# Patient Record
Sex: Male | Born: 1935 | Race: White | Hispanic: No | Smoking: Former smoker
Health system: Southern US, Community
[De-identification: ages and names within clinical notes are randomized; demographics above are authoritative.]

## PROBLEM LIST (undated history)

## (undated) DIAGNOSIS — G629 Polyneuropathy, unspecified: Secondary | ICD-10-CM

## (undated) DIAGNOSIS — K5900 Constipation, unspecified: Secondary | ICD-10-CM

## (undated) DIAGNOSIS — J449 Chronic obstructive pulmonary disease, unspecified: Secondary | ICD-10-CM

## (undated) DIAGNOSIS — I471 Supraventricular tachycardia, unspecified: Secondary | ICD-10-CM

## (undated) HISTORY — DX: Polyneuropathy, unspecified: G62.9

## (undated) HISTORY — DX: Supraventricular tachycardia: I47.1

## (undated) HISTORY — PX: CYST REMOVAL NECK: SHX6281

## (undated) HISTORY — DX: Supraventricular tachycardia, unspecified: I47.10

## (undated) HISTORY — DX: Constipation, unspecified: K59.00

---

## 2007-05-11 ENCOUNTER — Encounter: Admission: RE | Admit: 2007-05-11 | Discharge: 2007-05-11 | Payer: Self-pay | Admitting: Gastroenterology

## 2008-05-26 ENCOUNTER — Encounter: Admission: RE | Admit: 2008-05-26 | Discharge: 2008-05-26 | Payer: Self-pay | Admitting: Gastroenterology

## 2009-05-08 ENCOUNTER — Encounter: Admission: RE | Admit: 2009-05-08 | Discharge: 2009-05-08 | Payer: Self-pay | Admitting: Gastroenterology

## 2009-05-15 ENCOUNTER — Encounter: Admission: RE | Admit: 2009-05-15 | Discharge: 2009-05-15 | Payer: Self-pay | Admitting: Gastroenterology

## 2009-05-16 ENCOUNTER — Encounter: Admission: RE | Admit: 2009-05-16 | Discharge: 2009-05-16 | Payer: Self-pay | Admitting: Gastroenterology

## 2010-06-06 ENCOUNTER — Encounter: Admission: RE | Admit: 2010-06-06 | Discharge: 2010-06-06 | Payer: Self-pay | Admitting: Gastroenterology

## 2010-10-06 ENCOUNTER — Encounter: Payer: Self-pay | Admitting: Gastroenterology

## 2011-05-03 ENCOUNTER — Ambulatory Visit (INDEPENDENT_AMBULATORY_CARE_PROVIDER_SITE_OTHER): Payer: Self-pay

## 2011-05-03 ENCOUNTER — Inpatient Hospital Stay (INDEPENDENT_AMBULATORY_CARE_PROVIDER_SITE_OTHER)
Admission: RE | Admit: 2011-05-03 | Discharge: 2011-05-03 | Disposition: A | Discharge status: Home / Self Care | Payer: Self-pay | Source: Ambulatory Visit | Attending: Emergency Medicine | Admitting: Emergency Medicine

## 2011-05-03 DIAGNOSIS — S82843B Displaced bimalleolar fracture of unspecified lower leg, initial encounter for open fracture type I or II: Secondary | ICD-10-CM

## 2011-07-16 ENCOUNTER — Other Ambulatory Visit: Payer: Self-pay | Admitting: Gastroenterology

## 2011-07-16 DIAGNOSIS — R1013 Epigastric pain: Secondary | ICD-10-CM

## 2011-07-30 ENCOUNTER — Other Ambulatory Visit: Payer: Self-pay | Admitting: Gastroenterology

## 2011-07-30 ENCOUNTER — Ambulatory Visit
Admission: RE | Admit: 2011-07-30 | Discharge: 2011-07-30 | Disposition: A | Discharge status: Home / Self Care | Payer: Medicare Other | Source: Ambulatory Visit | Attending: Gastroenterology | Admitting: Gastroenterology

## 2011-07-30 DIAGNOSIS — Z1211 Encounter for screening for malignant neoplasm of colon: Secondary | ICD-10-CM

## 2011-07-30 DIAGNOSIS — R1013 Epigastric pain: Secondary | ICD-10-CM

## 2011-08-12 ENCOUNTER — Ambulatory Visit
Admission: RE | Admit: 2011-08-12 | Discharge: 2011-08-12 | Disposition: A | Discharge status: Home / Self Care | Payer: Medicare Other | Source: Ambulatory Visit | Attending: Gastroenterology | Admitting: Gastroenterology

## 2011-08-12 DIAGNOSIS — Z1211 Encounter for screening for malignant neoplasm of colon: Secondary | ICD-10-CM

## 2012-04-27 ENCOUNTER — Other Ambulatory Visit: Payer: Self-pay | Admitting: Gastroenterology

## 2012-04-27 DIAGNOSIS — R109 Unspecified abdominal pain: Secondary | ICD-10-CM

## 2012-05-04 ENCOUNTER — Ambulatory Visit
Admission: RE | Admit: 2012-05-04 | Discharge: 2012-05-04 | Disposition: A | Discharge status: Home / Self Care | Payer: Medicare Other | Source: Ambulatory Visit | Attending: Gastroenterology | Admitting: Gastroenterology

## 2012-05-04 DIAGNOSIS — R109 Unspecified abdominal pain: Secondary | ICD-10-CM

## 2013-04-25 ENCOUNTER — Other Ambulatory Visit: Payer: Self-pay | Admitting: Gastroenterology

## 2013-04-25 DIAGNOSIS — K3189 Other diseases of stomach and duodenum: Secondary | ICD-10-CM

## 2013-04-25 DIAGNOSIS — Z1211 Encounter for screening for malignant neoplasm of colon: Secondary | ICD-10-CM

## 2013-04-29 ENCOUNTER — Ambulatory Visit
Admission: RE | Admit: 2013-04-29 | Discharge: 2013-04-29 | Disposition: A | Discharge status: Home / Self Care | Payer: 59 | Source: Ambulatory Visit | Attending: Gastroenterology | Admitting: Gastroenterology

## 2013-04-29 DIAGNOSIS — Z1211 Encounter for screening for malignant neoplasm of colon: Secondary | ICD-10-CM

## 2013-04-29 DIAGNOSIS — R1013 Epigastric pain: Secondary | ICD-10-CM

## 2014-05-03 ENCOUNTER — Ambulatory Visit: Payer: Medicare PPO | Attending: Internal Medicine | Admitting: Physical Therapy

## 2014-05-03 DIAGNOSIS — M6281 Muscle weakness (generalized): Secondary | ICD-10-CM | POA: Diagnosis not present

## 2014-05-03 DIAGNOSIS — IMO0001 Reserved for inherently not codable concepts without codable children: Secondary | ICD-10-CM | POA: Insufficient documentation

## 2014-05-03 DIAGNOSIS — R269 Unspecified abnormalities of gait and mobility: Secondary | ICD-10-CM | POA: Diagnosis not present

## 2014-05-03 DIAGNOSIS — R293 Abnormal posture: Secondary | ICD-10-CM | POA: Diagnosis not present

## 2014-06-05 ENCOUNTER — Other Ambulatory Visit: Payer: Self-pay | Admitting: Gastroenterology

## 2014-06-05 DIAGNOSIS — R198 Other specified symptoms and signs involving the digestive system and abdomen: Secondary | ICD-10-CM

## 2014-06-09 ENCOUNTER — Ambulatory Visit
Admission: RE | Admit: 2014-06-09 | Discharge: 2014-06-09 | Disposition: A | Discharge status: Home / Self Care | Payer: 59 | Source: Ambulatory Visit | Attending: Gastroenterology | Admitting: Gastroenterology

## 2014-06-09 DIAGNOSIS — R198 Other specified symptoms and signs involving the digestive system and abdomen: Secondary | ICD-10-CM

## 2014-06-14 ENCOUNTER — Ambulatory Visit: Payer: Medicare PPO | Attending: Internal Medicine | Admitting: Physical Therapy

## 2014-06-14 DIAGNOSIS — M6281 Muscle weakness (generalized): Secondary | ICD-10-CM | POA: Diagnosis not present

## 2014-06-14 DIAGNOSIS — R293 Abnormal posture: Secondary | ICD-10-CM | POA: Diagnosis not present

## 2014-06-14 DIAGNOSIS — IMO0001 Reserved for inherently not codable concepts without codable children: Secondary | ICD-10-CM | POA: Insufficient documentation

## 2014-06-14 DIAGNOSIS — R269 Unspecified abnormalities of gait and mobility: Secondary | ICD-10-CM | POA: Diagnosis not present

## 2014-06-21 ENCOUNTER — Ambulatory Visit: Payer: Medicare PPO | Attending: Internal Medicine | Admitting: Physical Therapy

## 2014-06-21 DIAGNOSIS — R293 Abnormal posture: Secondary | ICD-10-CM | POA: Diagnosis not present

## 2014-06-21 DIAGNOSIS — M6281 Muscle weakness (generalized): Secondary | ICD-10-CM | POA: Insufficient documentation

## 2014-06-21 DIAGNOSIS — R269 Unspecified abnormalities of gait and mobility: Secondary | ICD-10-CM | POA: Insufficient documentation

## 2014-06-21 DIAGNOSIS — Z5189 Encounter for other specified aftercare: Secondary | ICD-10-CM | POA: Diagnosis not present

## 2014-06-23 ENCOUNTER — Ambulatory Visit: Payer: Medicare PPO

## 2014-06-23 DIAGNOSIS — Z5189 Encounter for other specified aftercare: Secondary | ICD-10-CM | POA: Diagnosis not present

## 2014-06-26 ENCOUNTER — Ambulatory Visit: Payer: Medicare PPO

## 2014-06-26 DIAGNOSIS — Z5189 Encounter for other specified aftercare: Secondary | ICD-10-CM | POA: Diagnosis not present

## 2014-06-28 ENCOUNTER — Ambulatory Visit: Payer: Medicare PPO

## 2014-06-28 DIAGNOSIS — Z5189 Encounter for other specified aftercare: Secondary | ICD-10-CM | POA: Diagnosis not present

## 2014-07-03 ENCOUNTER — Ambulatory Visit: Payer: Medicare PPO

## 2014-07-03 DIAGNOSIS — Z5189 Encounter for other specified aftercare: Secondary | ICD-10-CM | POA: Diagnosis not present

## 2014-07-06 ENCOUNTER — Ambulatory Visit (INDEPENDENT_AMBULATORY_CARE_PROVIDER_SITE_OTHER): Payer: Self-pay

## 2014-07-06 ENCOUNTER — Ambulatory Visit (INDEPENDENT_AMBULATORY_CARE_PROVIDER_SITE_OTHER): Payer: Medicare PPO | Admitting: Neurology

## 2014-07-06 DIAGNOSIS — Z0289 Encounter for other administrative examinations: Secondary | ICD-10-CM

## 2014-07-06 DIAGNOSIS — G609 Hereditary and idiopathic neuropathy, unspecified: Secondary | ICD-10-CM

## 2014-07-06 NOTE — Procedures (Signed)
HISTORY:  Hector Beltran is an 80 year old gentleman who reports some problems with balance and difficulty walking for 2 or 3 years. The patient has been using a cane for about one year. He reports no pain or discomfort in the back or in the extremities. He is being evaluated for possible neuropathy or a lumbosacral radiculopathy.  NERVE CONDUCTION STUDIES:  Nerve conduction studies were performed on the right upper extremity. The distal motor latencies and motor amplitudes for the median and ulnar nerves were within normal limits. The F wave latencies and nerve conduction velocities for these nerves were also normal. The sensory latencies for the median and ulnar nerves were normal.  Nerve conduction studies were performed on both lower extremities. The distal motor latencies for the peroneal and posterior tibial nerves were normal bilaterally, with low motor amplitudes for the left peroneal nerve and the right posterior tibial nerve. Normal motor amplitudes were seen for the right peroneal nerve and the left posterior tibial nerve. The nerve conduction velocities for the right peroneal nerve below the knee was slowed, normal above-the-knee. The nerve conduction velocities for the left peroneal nerve and for the posterior tibial nerves bilaterally were normal. The H reflex latencies were prolonged bilaterally, and the peroneal sensory latencies were unobtainable bilaterally.  EMG STUDIES:  EMG study was performed on the right lower extremity:  The tibialis anterior muscle reveals 2 to 8K motor units with decreased recruitment. No fibrillations or positive waves were seen. The peroneus tertius muscle reveals up to 10K motor units with decreased recruitment. No fibrillations or positive waves were seen. The medial gastrocnemius muscle reveals 2 to 4K motor units with decreased recruitment. No fibrillations or positive waves were seen. The vastus lateralis muscle reveals 2 to 4K motor units with  full recruitment. No fibrillations or positive waves were seen. The iliopsoas muscle reveals 2 to 4K motor units with full recruitment. No fibrillations or positive waves were seen. The biceps femoris muscle (long head) reveals 2 to 4K motor units with full recruitment. No fibrillations or positive waves were seen. The lumbosacral paraspinal muscles were tested at 3 levels, and revealed no abnormalities of insertional activity at all 3 levels tested. There was good relaxation.  EMG study was performed on the left lower extremity:  The tibialis anterior muscle reveals 2 to 7K motor units with decreased recruitment. No fibrillations or positive waves were seen. The peroneus tertius muscle reveals up to 8K motor units with decreased recruitment. No fibrillations or positive waves were seen. The medial gastrocnemius muscle reveals 2 to 5K motor units with decreased recruitment. No fibrillations or positive waves were seen. The vastus lateralis muscle reveals 2 to 4K motor units with full recruitment. No fibrillations or positive waves were seen. The iliopsoas muscle reveals 2 to 4K motor units with full recruitment. No fibrillations or positive waves were seen. The biceps femoris muscle (long head) reveals 2 to 4K motor units with full recruitment. No fibrillations or positive waves were seen. Complex repetitive discharges were seen. The lumbosacral paraspinal muscles were tested at 3 levels, and revealed no abnormalities of insertional activity at the upper and middle levels tested. One plus positive waves were seen at the lower level. There was good relaxation.   IMPRESSION:  Nerve conduction studies done on the right upper extremity and both lower extremities shows evidence of a primarily axonal peripheral neuropathy of moderate severity. EMG evaluation of the right lower extremity shows no evidence of an overlying lumbosacral radiculopathy. Distal  chronic denervation is seen consistent with the  diagnosis of a peripheral neuropathy. EMG of the left lower extremity shows distal chronic denervation consistent with a peripheral neuropathy, but there are mild, subtle findings that may be consistent with an overlying primarily chronic L5 radiculopathy. Clinical correlation is required.  Hector Beltran. Hector Willis MD 07/06/2014 2:18 PM  [facility] [address] [place], [place] [id number]  Phone [phone] Fax [phone]

## 2014-08-02 ENCOUNTER — Encounter: Payer: Self-pay | Admitting: Neurology

## 2014-08-08 ENCOUNTER — Encounter: Payer: Self-pay | Admitting: Neurology

## 2015-04-02 DIAGNOSIS — Z1211 Encounter for screening for malignant neoplasm of colon: Secondary | ICD-10-CM | POA: Diagnosis not present

## 2015-04-11 DIAGNOSIS — H2513 Age-related nuclear cataract, bilateral: Secondary | ICD-10-CM | POA: Diagnosis not present

## 2015-05-16 DIAGNOSIS — C44319 Basal cell carcinoma of skin of other parts of face: Secondary | ICD-10-CM | POA: Diagnosis not present

## 2015-05-16 DIAGNOSIS — L218 Other seborrheic dermatitis: Secondary | ICD-10-CM | POA: Diagnosis not present

## 2015-05-16 DIAGNOSIS — L720 Epidermal cyst: Secondary | ICD-10-CM | POA: Diagnosis not present

## 2015-05-16 DIAGNOSIS — Z85828 Personal history of other malignant neoplasm of skin: Secondary | ICD-10-CM | POA: Diagnosis not present

## 2015-05-16 DIAGNOSIS — D485 Neoplasm of uncertain behavior of skin: Secondary | ICD-10-CM | POA: Diagnosis not present

## 2015-05-16 DIAGNOSIS — L821 Other seborrheic keratosis: Secondary | ICD-10-CM | POA: Diagnosis not present

## 2015-06-06 DIAGNOSIS — E782 Mixed hyperlipidemia: Secondary | ICD-10-CM | POA: Diagnosis not present

## 2015-06-06 DIAGNOSIS — F17210 Nicotine dependence, cigarettes, uncomplicated: Secondary | ICD-10-CM | POA: Diagnosis not present

## 2015-06-20 DIAGNOSIS — C44319 Basal cell carcinoma of skin of other parts of face: Secondary | ICD-10-CM | POA: Diagnosis not present

## 2015-06-20 DIAGNOSIS — Z85828 Personal history of other malignant neoplasm of skin: Secondary | ICD-10-CM | POA: Diagnosis not present

## 2015-08-28 DIAGNOSIS — L853 Xerosis cutis: Secondary | ICD-10-CM | POA: Diagnosis not present

## 2015-08-28 DIAGNOSIS — L218 Other seborrheic dermatitis: Secondary | ICD-10-CM | POA: Diagnosis not present

## 2015-08-28 DIAGNOSIS — L570 Actinic keratosis: Secondary | ICD-10-CM | POA: Diagnosis not present

## 2015-08-28 DIAGNOSIS — Z85828 Personal history of other malignant neoplasm of skin: Secondary | ICD-10-CM | POA: Diagnosis not present

## 2015-09-26 ENCOUNTER — Emergency Department (HOSPITAL_COMMUNITY)
Admission: EM | Admit: 2015-09-26 | Discharge: 2015-09-26 | Disposition: A | Discharge status: Home / Self Care | Payer: Medicare Other | Attending: Emergency Medicine | Admitting: Emergency Medicine

## 2015-09-26 ENCOUNTER — Encounter (HOSPITAL_COMMUNITY): Payer: Self-pay | Admitting: Emergency Medicine

## 2015-09-26 DIAGNOSIS — Y998 Other external cause status: Secondary | ICD-10-CM | POA: Diagnosis not present

## 2015-09-26 DIAGNOSIS — Y9389 Activity, other specified: Secondary | ICD-10-CM | POA: Insufficient documentation

## 2015-09-26 DIAGNOSIS — S0990XA Unspecified injury of head, initial encounter: Secondary | ICD-10-CM | POA: Diagnosis not present

## 2015-09-26 DIAGNOSIS — Y9289 Other specified places as the place of occurrence of the external cause: Secondary | ICD-10-CM | POA: Insufficient documentation

## 2015-09-26 DIAGNOSIS — S01111A Laceration without foreign body of right eyelid and periocular area, initial encounter: Secondary | ICD-10-CM | POA: Diagnosis not present

## 2015-09-26 DIAGNOSIS — IMO0002 Reserved for concepts with insufficient information to code with codable children: Secondary | ICD-10-CM

## 2015-09-26 DIAGNOSIS — Z23 Encounter for immunization: Secondary | ICD-10-CM | POA: Diagnosis not present

## 2015-09-26 MED ORDER — TETANUS-DIPHTH-ACELL PERTUSSIS 5-2.5-18.5 LF-MCG/0.5 IM SUSP
0.5000 mL | Freq: Once | INTRAMUSCULAR | Status: AC
  Administered 2015-09-26: 0.5 mL via INTRAMUSCULAR
  Filled 2015-09-26: qty 0.5

## 2015-09-26 MED ORDER — LIDOCAINE HCL 2 % IJ SOLN
10.0000 mL | Freq: Once | INTRAMUSCULAR | Status: AC
  Administered 2015-09-26: 200 mg via INTRADERMAL
  Filled 2015-09-26: qty 20

## 2015-09-26 MED ORDER — SODIUM BICARBONATE 4 % IV SOLN
5.0000 mL | Freq: Once | INTRAVENOUS | Status: AC
  Administered 2015-09-26: 5 mL via SUBCUTANEOUS
  Filled 2015-09-26: qty 5
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
## 2015-09-26 NOTE — ED Notes (Signed)
CSI officer at bedside.
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
## 2015-09-26 NOTE — ED Notes (Signed)
MD at bedside. 
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
## 2015-09-26 NOTE — ED Notes (Signed)
PA at bedside.

## 2016-08-19 ENCOUNTER — Emergency Department (HOSPITAL_COMMUNITY): Payer: Medicare Other

## 2016-08-19 ENCOUNTER — Emergency Department (HOSPITAL_COMMUNITY)
Admission: EM | Admit: 2016-08-19 | Discharge: 2016-08-19 | Disposition: A | Discharge status: Home / Self Care | Payer: Medicare Other | Attending: Emergency Medicine | Admitting: Emergency Medicine

## 2016-08-19 ENCOUNTER — Encounter (HOSPITAL_COMMUNITY): Payer: Self-pay

## 2016-08-19 DIAGNOSIS — Y9369 Activity, other involving other sports and athletics played as a team or group: Secondary | ICD-10-CM | POA: Diagnosis not present

## 2016-08-19 DIAGNOSIS — Y92009 Unspecified place in unspecified non-institutional (private) residence as the place of occurrence of the external cause: Secondary | ICD-10-CM | POA: Diagnosis not present

## 2016-08-19 DIAGNOSIS — S0101XA Laceration without foreign body of scalp, initial encounter: Secondary | ICD-10-CM | POA: Insufficient documentation

## 2016-08-19 DIAGNOSIS — Y999 Unspecified external cause status: Secondary | ICD-10-CM | POA: Diagnosis not present

## 2016-08-19 DIAGNOSIS — F17210 Nicotine dependence, cigarettes, uncomplicated: Secondary | ICD-10-CM | POA: Insufficient documentation

## 2016-08-19 DIAGNOSIS — S0990XA Unspecified injury of head, initial encounter: Secondary | ICD-10-CM

## 2016-08-19 MED ORDER — ACETAMINOPHEN 500 MG PO TABS: 500.0000 mg | ORAL_TABLET | Freq: Four times a day (QID) | ORAL | 0 refills | Status: DC | PRN

## 2016-08-19 MED ORDER — LIDOCAINE-EPINEPHRINE-TETRACAINE (LET) SOLUTION
3.0000 mL | Freq: Once | NASAL | Status: AC
  Administered 2016-08-19: 3 mL via TOPICAL
  Filled 2016-08-19: qty 3

## 2016-08-19 MED ORDER — BACITRACIN ZINC 500 UNIT/GM EX OINT: 1.0000 "application " | TOPICAL_OINTMENT | Freq: Two times a day (BID) | CUTANEOUS | 1 refills | Status: DC

## 2016-08-19 NOTE — ED Notes (Signed)
Pt taken to CT.
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
## 2017-07-16 ENCOUNTER — Telehealth: Payer: Self-pay | Admitting: Neurology

## 2017-07-16 ENCOUNTER — Ambulatory Visit: Payer: Medicare Other | Admitting: Neurology

## 2017-07-16 NOTE — Telephone Encounter (Signed)
This patient did not show for a new patient appointment today. 

## 2017-07-17 ENCOUNTER — Encounter: Payer: Self-pay | Admitting: Neurology

## 2017-09-27 ENCOUNTER — Telehealth: Payer: Self-pay | Admitting: *Deleted
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
## 2017-09-28 ENCOUNTER — Ambulatory Visit: Payer: Medicare Other | Admitting: Neurology

## 2017-09-30 ENCOUNTER — Encounter: Payer: Self-pay | Admitting: Neurology

## 2017-09-30 ENCOUNTER — Ambulatory Visit: Payer: Medicare Other | Admitting: Neurology

## 2017-09-30 VITALS — BP 144/86 | HR 86 | Ht 75.0 in | Wt 185.5 lb

## 2017-09-30 DIAGNOSIS — E538 Deficiency of other specified B group vitamins: Secondary | ICD-10-CM

## 2017-09-30 DIAGNOSIS — G629 Polyneuropathy, unspecified: Secondary | ICD-10-CM | POA: Insufficient documentation

## 2017-09-30 DIAGNOSIS — G609 Hereditary and idiopathic neuropathy, unspecified: Secondary | ICD-10-CM

## 2017-09-30 HISTORY — DX: Polyneuropathy, unspecified: G62.9

## 2017-09-30 NOTE — Progress Notes (Signed)
Reason for visit: Peripheral neuropathy  Referring physician: Dr. Elliot Beltran is a 84 y.o. male  History of present illness:  Hector Beltran is an 84 year old right-handed white male with a history of a peripheral neuropathy.  The patient was seen here in 2015 for nerve conduction and EMG study only.  The study at that time confirmed the presence of an axonal peripheral neuropathy.  The patient has had gradual worsening of his ability to ambulate, he has been using a walker since April 2016.  He has not had any recent falls.  Fortunately, he reports no discomfort of the arms or legs whatsoever.  He does not have any perception of true numbness in the feet or hands.  He is sleeping well at night.  He uses a walker at all times with walking which offers much better gait stability.  He has seen a gradual decline in his ability to ambulate over the last 3 years.  He denies issues controlling the bowels or the bladder.  He returns to this office for an evaluation.  He denies any neck pain or low back pain or pain down the arms and legs.  Past Medical History:  Diagnosis Date  . Peripheral neuropathy 09/30/2017    Past Surgical History:  Procedure Laterality Date  . CYST REMOVAL NECK      History reviewed. No pertinent family history.  Social history:  reports that he has been smoking cigarettes.  he has never used smokeless tobacco. He reports that he drinks alcohol. He reports that he does not use drugs.  Medications:  Prior to Admission medications   Medication Sig Start Date End Date Taking? Authorizing Provider  Multiple Vitamin (MULTIVITAMIN WITH MINERALS) TABS tablet Take 1 tablet by mouth daily.   Yes [provider]     No Known Allergies  ROS:  Out of a complete 14 system review of symptoms, the patient complains only of the following symptoms, and all other reviewed systems are negative.  Rash Weakness  Blood pressure (!) 144/86, pulse 86, height 6\' 3"   (1.905 m), weight 185 lb 8 oz (84.1 kg).  Physical Exam  General: The patient is alert and cooperative at the time of the examination.  Eyes: Pupils are equal, round, and reactive to light. Discs are flat bilaterally.  Neck: The neck is supple, no carotid bruits are noted.  Respiratory: The respiratory examination is clear.  Cardiovascular: The cardiovascular examination reveals a regular rate and rhythm, no obvious murmurs or rubs are noted.  Skin: Extremities are without significant edema.  Neurologic Exam  Mental status: The patient is alert and oriented x 3 at the time of the examination. The patient has apparent normal recent and remote memory, with an apparently normal attention span and concentration ability.  Cranial nerves: Facial symmetry is present. There is good sensation of the face to pinprick and soft touch bilaterally. The strength of the facial muscles and the muscles to head turning and shoulder shrug are normal bilaterally. Speech is well enunciated, no aphasia or dysarthria is noted. Extraocular movements are full. Visual fields are full. The tongue is midline, and the patient has symmetric elevation of the soft palate. No obvious hearing deficits are noted.  Motor: The motor testing reveals 5 over 5 strength of all 4 extremities, with exception of 4/5 strength with hip flexion bilaterally. Good symmetric motor tone is noted throughout.  The patient is unable to arise from a seated position with arms crossed.  Sensory: Sensory testing is intact to pinprick, soft touch, vibration sensation, and position sense on the upper extremities.  With the lower extremities there is a stocking pattern pinprick sensory deficit across the ankles bilaterally.  Significant impairment of vibration sensation and position sensation is seen in both feet.  No evidence of extinction is noted.  Coordination: Cerebellar testing reveals good finger-nose-finger and heel-to-shin  bilaterally.  Gait and station: Gait is associated with a short shuffling gait, slightly wide-based.  The patient normally uses a walker for ambulation.  Romberg is negative. No drift is seen.   Reflexes: Deep tendon reflexes are symmetric and normal bilaterally, but the ankle jerk reflexes are decreased absent bilaterally. Toes are downgoing bilaterally.   Assessment/Plan:  1.  Peripheral neuropathy  2.  Gait disturbance  3.  Proximal weakness of legs bilaterally  The patient has evidence of a peripheral neuropathy by clinical examination that correlates well with the prior EMG and nerve conduction study evaluation.  He does however have proximal muscle weakness of both legs that impairs his ability to ambulate as well, this likely is not related to the peripheral neuropathy.  The patient does exercise on a regular basis with water aerobics.  We will check blood work today.  We may consider repeat EMG and nerve conduction study evaluation if the progression of his proximal weakness is noted.  He will follow-up in 5 or 6 months.  Fortunately, he is not having any pain with the neuropathy, there is no indication for medical therapy.  Hector Beltran. Hector Kiyomi Pallo MD 09/30/2017 1:59 PM  [facility] [address] [place], [place] [id number]  Phone [phone] Fax [phone]

## 2017-10-02 ENCOUNTER — Telehealth: Payer: Self-pay | Admitting: Neurology

## 2017-10-02 NOTE — Telephone Encounter (Signed)
Pt wants a call back from the nurse regarding NCV/EMG testing results [phone]

## 2017-10-02 NOTE — Telephone Encounter (Signed)
Tried calling pt back. Unavailable on phone number. Got automated system asking for zipcode, phone number and pin. Will try again later

## 2017-10-05 NOTE — Telephone Encounter (Signed)
Tried calling patient back again. Got same automated message asking for area code and phone number. Unable to reach patient.

## 2017-10-06 ENCOUNTER — Telehealth: Payer: Self-pay | Admitting: Neurology

## 2017-10-06 DIAGNOSIS — G603 Idiopathic progressive neuropathy: Secondary | ICD-10-CM

## 2017-10-06 NOTE — Telephone Encounter (Signed)
The patient wants to have a comparison study for his peripheral neuropathy.  He feels that this information is very important to him.  We will therefore schedule an EMG and nerve conduction study test.

## 2017-10-07 ENCOUNTER — Telehealth: Payer: Self-pay | Admitting: *Deleted

## 2017-10-07 ENCOUNTER — Other Ambulatory Visit: Payer: Self-pay | Admitting: Internal Medicine

## 2017-10-07 ENCOUNTER — Ambulatory Visit
Admission: RE | Admit: 2017-10-07 | Discharge: 2017-10-07 | Disposition: A | Discharge status: Home / Self Care | Payer: Medicare Other | Source: Ambulatory Visit | Attending: Internal Medicine | Admitting: Internal Medicine

## 2017-10-07 DIAGNOSIS — J449 Chronic obstructive pulmonary disease, unspecified: Secondary | ICD-10-CM

## 2017-10-07 LAB — B. BURGDORFI ANTIBODIES

## 2017-10-07 LAB — MULTIPLE MYELOMA PANEL, SERUM
ALPHA2 GLOB SERPL ELPH-MCNC: 0.7 g/dL (ref 0.4–1.0)
Albumin SerPl Elph-Mcnc: 3.7 g/dL (ref 2.9–4.4)
Albumin/Glob SerPl: 1.3 (ref 0.7–1.7)
Alpha 1: 0.3 g/dL (ref 0.0–0.4)
B-GLOBULIN SERPL ELPH-MCNC: 1.2 g/dL (ref 0.7–1.3)
GLOBULIN, TOTAL: 3 g/dL (ref 2.2–3.9)
Gamma Glob SerPl Elph-Mcnc: 0.9 g/dL (ref 0.4–1.8)
IGM (IMMUNOGLOBULIN M), SRM: 22 mg/dL (ref 15–143)
IgA/Immunoglobulin A, Serum: 290 mg/dL (ref 61–437)
IgG (Immunoglobin G), Serum: 987 mg/dL (ref 700–1600)
Total Protein: 6.7 g/dL (ref 6.0–8.5)

## 2017-10-07 LAB — ANA W/REFLEX: Anti Nuclear Antibody(ANA): NEGATIVE

## 2017-10-07 LAB — CK: Total CK: 61 U/L (ref 24–204)

## 2017-10-07 LAB — RHEUMATOID FACTOR

## 2017-10-07 LAB — ANGIOTENSIN CONVERTING ENZYME: Angio Convert Enzyme: 40 U/L (ref 14–82)

## 2017-10-07 LAB — VITAMIN B12: Vitamin B-12: 391 pg/mL (ref 232–1245)

## 2017-10-07 LAB — SEDIMENTATION RATE: Sed Rate: 10 mm/hr (ref 0–30)

## 2017-10-07 NOTE — Telephone Encounter (Signed)
-----   Message from York Spanielharles K Willis, MD sent at 10/07/2017 12:36 PM EST -----  The blood work results are unremarkable. Please call the patient.  ----- Message ----- From: Nell RangeInterface, [facility] Lab Results In Sent: 10/01/2017   7:45 AM To: York Spanielharles K Willis, MD

## 2017-10-07 NOTE — Telephone Encounter (Signed)
Called and spoke with pt about unremarkable labs per CW,MD note. Pt verbalized understanding  Also scheduled pt for EMG/NCS for 10/19/17 at 245pm, check in 215pm.

## 2017-10-12 ENCOUNTER — Encounter: Payer: Self-pay | Admitting: Neurology

## 2017-10-19 ENCOUNTER — Ambulatory Visit (INDEPENDENT_AMBULATORY_CARE_PROVIDER_SITE_OTHER): Payer: Medicare Other | Admitting: Neurology

## 2017-10-19 ENCOUNTER — Encounter: Payer: Self-pay | Admitting: Neurology

## 2017-10-19 ENCOUNTER — Ambulatory Visit: Payer: Medicare Other | Admitting: Neurology

## 2017-10-19 DIAGNOSIS — G609 Hereditary and idiopathic neuropathy, unspecified: Secondary | ICD-10-CM

## 2017-10-19 DIAGNOSIS — G603 Idiopathic progressive neuropathy: Secondary | ICD-10-CM

## 2017-10-19 NOTE — Procedures (Signed)
     HISTORY:  Hector Beltran is an 84 year old gentleman with a history of a peripheral neuropathy.  He has noted some progression of his imbalance problem with ambulation over the last several years, he comes in for reevaluation of his neuropathy.  He reports he has more weakness of the right leg than the left.  NERVE CONDUCTION STUDIES:  Nerve conduction studies were performed on both lower extremities.  No response was seen for the peroneal or posterior tibial nerves bilaterally.  The peroneal sensory latencies were unobtainable bilaterally.  EMG STUDIES:  EMG study was performed on the right lower extremity:  The tibialis anterior muscle reveals 2 to 5K motor units with decreased recruitment. No fibrillations or positive waves were seen. The peroneus tertius muscle reveals 2 to 6K motor units with moderately decreased recruitment. No fibrillations or positive waves were seen. The medial gastrocnemius muscle reveals 1 to 4K motor units with decreased recruitment. No fibrillations or positive waves were seen. The vastus lateralis muscle reveals 2 to 4K motor units with full recruitment. No fibrillations or positive waves were seen. The iliopsoas muscle reveals 2 to 4K motor units with full recruitment. No fibrillations or positive waves were seen. The biceps femoris muscle (long head) reveals 2 to 3K motor units with full recruitment. No fibrillations or positive waves were seen. The lumbosacral paraspinal muscles were tested at 3 levels, and revealed no abnormalities of insertional activity at all 3 levels tested. There was good relaxation.   IMPRESSION:  Nerve conduction studies done on both lower extremities were unobtainable.  Nerve conduction studies suggest a significant peripheral neuropathy with progression from the last study done on 06 July 2014.  EMG evaluation of the right lower extremity shows distal chronic stable signs of denervation consistent with a diagnosis of  peripheral neuropathy.  No evidence of an overlying lumbosacral radiculopathy was seen.  Hector Beltran. Keith Willis MD 10/19/2017 4:32 PM  [facility] [address] [place], [place] [id number]  Phone [phone] Fax [phone]

## 2017-10-19 NOTE — Progress Notes (Signed)
Please refer to EMG and nerve conduction study procedure note. 

## 2017-10-21 NOTE — Progress Notes (Signed)
[facility]    Nerve / Sites Muscle Latency Ref. Amplitude Ref. Rel Amp Segments Distance Ref. Area    ms ms mV mV %  cm m/s mVms  L Peroneal - EDB     Ankle EDB NR ?6.5 NR ?2.0 NR Ankle - EDB 9  NR     Fib head EDB NR  NR  NR Fib head - Ankle  ?44 NR     Pop fossa EDB NR  NR  NR Pop fossa - Fib head  ?44 NR         Pop fossa - Ankle     R Peroneal - EDB     Ankle EDB NR ?6.5 NR ?2.0 NR Ankle - EDB 9  NR     Fib head EDB NR  NR  NR Fib head - Ankle  ?44 NR     Pop fossa EDB NR  NR  NR Pop fossa - Fib head  ?44 NR         Pop fossa - Ankle     L Tibial - AH     Ankle AH NR ?5.8 NR ?4.0 NR Ankle - AH 9  NR     Pop fossa AH NR  NR  NR Pop fossa - Ankle  ?41 NR  R Tibial - AH     Ankle AH NR ?5.8 NR ?4.0 NR Ankle - AH 9  NR     Pop fossa AH NR  NR  NR Pop fossa - Ankle  ?41 NR             [facility]    Nerve / Sites Rec. Site Peak Lat Ref.  Amp Ref. Segments Distance    ms ms V V  cm  L Superficial peroneal - Ankle     Lat leg Ankle NR ?4.4 NR ?6 Lat leg - Ankle 14  R Superficial peroneal - Ankle     Lat leg Ankle NR ?4.4 NR ?6 Lat leg - Ankle 14         EMG

## 2017-11-19 ENCOUNTER — Ambulatory Visit: Payer: Self-pay | Admitting: Neurology

## 2018-01-06 ENCOUNTER — Telehealth: Payer: Self-pay | Admitting: Neurology

## 2018-01-06 NOTE — Telephone Encounter (Addendum)
Called pt again. Was able to speak with him. Scheduled appt for 03/02/18 at 12pm, check in 1130am. He verbalized understanding and read appt date/time back correctly.   I cx appt that was made for 06/01/18 since we r/s for sooner appt.

## 2018-01-06 NOTE — Telephone Encounter (Signed)
Tried calling patient back. Unable to reach.Went to automated message asking for number/access code.     Can offer appt on 03/02/18 at 12pm, check in 1130am if he calls

## 2018-01-06 NOTE — Telephone Encounter (Signed)
Patient requesting sooner apt with Dr. Anne HahnWillis than the next available in August. Best call back is [phone]

## 2018-03-02 ENCOUNTER — Encounter: Payer: Self-pay | Admitting: Neurology

## 2018-03-02 ENCOUNTER — Encounter

## 2018-03-02 ENCOUNTER — Ambulatory Visit: Payer: Medicare Other | Admitting: Neurology

## 2018-03-02 VITALS — BP 153/87 | HR 74 | Ht 75.0 in | Wt 192.0 lb

## 2018-03-02 DIAGNOSIS — G609 Hereditary and idiopathic neuropathy, unspecified: Secondary | ICD-10-CM

## 2018-03-02 DIAGNOSIS — R269 Unspecified abnormalities of gait and mobility: Secondary | ICD-10-CM | POA: Diagnosis not present

## 2018-03-02 NOTE — Progress Notes (Signed)
    Reason for visit: Peripheral neuropathy  Hector Beltran is an 84 y.o. male  History of present illness:  Hector Beltran is an 84 year old right-handed white male with a history of peripheral neuropathy that is slowly progressed over time.  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  He is able to rest well at night.  He has a walker for ambulation, he reports no falls since last seen.  He exercises on a regular basis, he participates in water aerobics.  The patient has had progression of the neuropathy since 2015.  Past Medical History:  Diagnosis Date  . Peripheral neuropathy 09/30/2017    Past Surgical History:  Procedure Laterality Date  . CYST REMOVAL NECK      History reviewed. No pertinent family history.  Social history:  reports that he has been smoking cigarettes.  He has never used smokeless tobacco. He reports that he drinks alcohol. He reports that he does not use drugs.   No Known Allergies  Medications:  Prior to Admission medications   Medication Sig Start Date End Date Taking? Authorizing Provider  Multiple Vitamin (MULTIVITAMIN WITH MINERALS) TABS tablet Take 1 tablet by mouth daily.   Yes [provider]    ROS:  Out of a complete 14 system review of symptoms, the patient complains only of the following symptoms, and all other reviewed systems are negative.  Weakness Walking difficulty  Blood pressure (!) 153/87, pulse 74, height 6\' 3"  (1.905 m), weight 192 lb (87.1 kg).  Physical Exam  General: The patient is alert and cooperative at the time of the examination.  Skin: No significant peripheral edema is noted.   Neurologic Exam  Mental status: The patient is alert and oriented x 3 at the time of the examination. The patient has apparent normal recent and remote memory, with an apparently normal attention span and concentration ability.   Cranial nerves: Facial symmetry is  present. Speech is normal, no aphasia or dysarthria is noted. Extraocular movements are full. Visual fields are full.  Motor: The patient has good strength in all 4 extremities, with exception that the hip flexion is 3/5 strength bilaterally.  Sensory examination: Soft touch sensation is symmetric on the face, arms, and legs.  Coordination: The patient has good finger-nose-finger and heel-to-shin bilaterally.  Gait and station: The patient has a stooped posture, wide-based gait.  The patient walks with a walker.  Tandem gait was not attempted.  Romberg is negative.  Reflexes: Deep tendon reflexes are symmetric, but are depressed.   Assessment/Plan:  1.  Peripheral neuropathy  2.  Gait disturbance  The patient fortunately has no pain, he mainly has an issue with balance and with weakness in the legs.  He will continue exercising the legs.  I will follow-up with him in about 1 year.  He is remaining safe, I gave him a prescription for a rolling walker, the current one that he has is too short for him.  Hector Palau. Keith Willis MD 03/02/2018 11:57 AM  [facility] [address] [place], [place] [id number]  Phone [phone] Fax [phone]

## 2018-04-01 ENCOUNTER — Ambulatory Visit: Payer: Medicare Other | Admitting: Neurology

## 2018-05-19 ENCOUNTER — Telehealth: Payer: Self-pay | Admitting: *Deleted

## 2018-05-19 NOTE — Telephone Encounter (Signed)
Walking is in decline and having left hand weakness.  Cant hold toothbush. Please call.

## 2018-05-19 NOTE — Telephone Encounter (Signed)
I have tried multiple times to get through to Mr. Hector Beltran, his telephone number does not seem to be working properly.  I called the son and left a message, he is to contact our office and let me know a telephone number that will allow me to access the patient.

## 2018-05-19 NOTE — Telephone Encounter (Signed)
I called the patient, unable to get through on his telephone number, I will try calling back later.

## 2018-05-19 NOTE — Telephone Encounter (Signed)
I called the patient again, unable to reach him on the telephone number provided.

## 2018-05-20 NOTE — Telephone Encounter (Signed)
I called again, I was able to reach the patient.  The patient indicates that he has had some gradual worsening of his walking over time with his peripheral neuropathy.  What is new is that over the last 10 days he has had relatively sudden onset of problems with using the left hand, he cannot hold the toothbrush in the hand.  I will get him in for an evaluation, need to determine whether this is related to the neuropathy or whether the patient may have had a cerebrovascular event.

## 2018-05-21 NOTE — Telephone Encounter (Signed)
Tried contacting pt. Unable to reach, went to automated message asking for pin. Will try again later

## 2018-05-21 NOTE — Telephone Encounter (Signed)
Tried calling pt again, went to automated message and could not LVM

## 2018-05-25 NOTE — Telephone Encounter (Signed)
Called pt. Scheduled work in appt for 06/02/18 at 730am. Pt verbalized understanding.

## 2018-06-01 ENCOUNTER — Encounter

## 2018-06-01 ENCOUNTER — Ambulatory Visit: Payer: Medicare Other | Admitting: Neurology

## 2018-06-02 ENCOUNTER — Ambulatory Visit: Payer: Medicare Other | Admitting: Neurology

## 2018-06-02 ENCOUNTER — Encounter: Payer: Self-pay | Admitting: Neurology

## 2018-06-02 VITALS — BP 150/79 | HR 77 | Wt 194.0 lb

## 2018-06-02 DIAGNOSIS — G609 Hereditary and idiopathic neuropathy, unspecified: Secondary | ICD-10-CM | POA: Diagnosis not present

## 2018-06-02 NOTE — Progress Notes (Signed)
Reason for visit: Peripheral neuropathy  Hector Beltran is a 84 y.o. male  History of present illness:  Hector Beltran is an 84 year old right-handed white male with a history of a peripheral neuropathy associated with a gait disorder.  The patient last had EMG and nerve conduction study evaluation in February 2019.  The patient does not recall having this study done.  He had blood work done in January 2019 looking for various causes of peripheral neuropathy, the studies were unremarkable.  The patient comes in on an urgent basis for reported new issues.  The patient claims that he has had a gradual onset of problems using the left arm, he is not able to brush his teeth well with the left arm.  He denies any numbness or definite weakness in the arm.  He has not reported any change in his ability to ambulate.  He reports no history of headache, vision changes, or changes in speech or swallowing.  He does have neck pain and occasional sharp discomfort in the neck, he denies any pain radiating down the arms.  He denies low back pain or pain down the legs.  He denies any significant discomfort in the legs, he is able to rest well at night.  He denies any changes in bowel or bladder control.  Again, the change in the left arm and hand function came on gradually, not suddenly.  He comes in today for an evaluation.  Past Medical History:  Diagnosis Date  . Peripheral neuropathy 09/30/2017    Past Surgical History:  Procedure Laterality Date  . CYST REMOVAL NECK      History reviewed. No pertinent family history.  Social history:  reports that he has been smoking cigarettes. He has never used smokeless tobacco. He reports that he drinks alcohol. He reports that he does not use drugs.  Medications:  Prior to Admission medications   Medication Sig Start Date End Date Taking? Authorizing Provider  Multiple Vitamin (MULTIVITAMIN WITH MINERALS) TABS tablet Take 1 tablet by mouth daily.   Yes [provider]     No Known Allergies  ROS:  Out of a complete 14 system review of symptoms, the patient complains only of the following symptoms, and all other reviewed systems are negative.  Walking difficulty Weakness  Blood pressure (!) 150/79, pulse 77, weight 194 lb (88 kg).  Physical Exam  General: The patient is alert and cooperative at the time of the examination.  Eyes: Pupils are equal, round, and reactive to light. Discs are flat bilaterally.  Neck: The neck is supple, no carotid bruits are noted.  Respiratory: The respiratory examination is clear.  Cardiovascular: The cardiovascular examination reveals a regular rate and rhythm, no obvious murmurs or rubs are noted.  Skin: Extremities are without significant edema.  Neurologic Exam  Mental status: The patient is alert and oriented x 3 at the time of the examination. The patient has apparent normal recent and remote memory, with an apparently normal attention span and concentration ability.  Cranial nerves: Facial symmetry is present. There is good sensation of the face to pinprick and soft touch bilaterally. The strength of the facial muscles and the muscles to head turning and shoulder shrug are normal bilaterally. Speech is well enunciated, no aphasia or dysarthria is noted. Extraocular movements are full. Visual fields are full. The tongue is midline, and the patient has symmetric elevation of the soft palate. No obvious hearing deficits are noted.  Motor: The motor  testing reveals 5 over 5 strength of all 4 extremities, with exception of slight weakness with hip flexion bilaterally. Good symmetric motor tone is noted throughout.  Sensory: Sensory testing is notable for some decrease in pinprick sensation on the left arm and left leg as compared to the right, vibration sensation is intact in all fours, there is a stocking pattern sensory deficit across the ankles bilaterally. No evidence of extinction is  noted.  Coordination: Cerebellar testing reveals good finger-nose-finger and heel-to-shin bilaterally.  Gait and station: Gait is slightly wide-based, unsteady.  The patient normally uses a walker for ambulation.  The patient has a stooped posture.  Gait is shuffling in nature.  Tandem gait was not attempted.  Romberg is negative.  Reflexes: Deep tendon reflexes are symmetric, but are depressed bilaterally. Toes are downgoing bilaterally.   Assessment/Plan:  1.  Peripheral neuropathy  2.  Gait disorder  3.  New onset left arm dysfunction  The strength and coordination of the left arm appears to be unremarkable today, but the patient has reported some changes in function of the arm.  The patient has had a gradual onset of the changes which goes against the possibility of cerebrovascular disease as an etiology.  The patient will be set up for nerve conduction studies of both arms, and one leg.  EMG will be done on the left arm.  He will return for the above evaluation.  Hector Palau. Keith Willis MD 06/02/2018 7:50 AM  [facility] [address] [place], [place] [id number]  Phone [phone] Fax [phone]

## 2018-06-29 ENCOUNTER — Ambulatory Visit (INDEPENDENT_AMBULATORY_CARE_PROVIDER_SITE_OTHER): Payer: Medicare Other | Admitting: Neurology

## 2018-06-29 ENCOUNTER — Encounter: Payer: Self-pay | Admitting: Neurology

## 2018-06-29 ENCOUNTER — Ambulatory Visit: Payer: Medicare Other | Admitting: Neurology

## 2018-06-29 DIAGNOSIS — G609 Hereditary and idiopathic neuropathy, unspecified: Secondary | ICD-10-CM | POA: Diagnosis not present

## 2018-06-29 MED ORDER — SILDENAFIL CITRATE 50 MG PO TABS: 50.0000 mg | ORAL_TABLET | Freq: Every day | ORAL | 3 refills | Status: DC | PRN

## 2018-06-29 NOTE — Progress Notes (Signed)
Please refer to EMG and nerve conduction procedure note.  

## 2018-06-29 NOTE — Procedures (Signed)
     HISTORY:  Hector Beltran is an 84 year old gentleman with a history of a peripheral neuropathy who complains of some clumsiness involving the left hand, difficulty brushing his teeth.  The patient is being evaluated for these new symptoms.  NERVE CONDUCTION STUDIES:  Nerve conduction studies were performed on both upper extremities.  The distal motor latencies for the median nerves were normal bilaterally with normal motor amplitudes for these nerves.  The distal motor latencies for the ulnar nerves were slightly prolonged on the left and borderline normal on the right with normal motor amplitudes.  Slowing was seen above and below the elbows for the ulnar nerves, nerve conduction velocities for the median nerves were normal bilaterally.  The sensory latencies for the radial nerves were normal bilaterally, the left median sensory latency was prolonged, normal on the right and prolonged for the ulnar nerves bilaterally.  The F-wave latencies for the ulnar nerves were prolonged bilaterally.  Nerve conduction studies were performed on the left lower extremity.  No response was seen for the peroneal and posterior tibial nerves.  The left sural and peroneal sensory latencies were unobtainable.  EMG STUDIES:  EMG study was performed on the left upper extremity:  The first dorsal interosseous muscle reveals 2 to 4 K units with full recruitment. No fibrillations or positive waves were noted. The abductor pollicis brevis muscle reveals 2 to 4 K units with full recruitment. No fibrillations or positive waves were noted. The extensor indicis proprius muscle reveals 1 to 3 K units with full recruitment. No fibrillations or positive waves were noted. The pronator teres muscle reveals 2 to 3 K units with full recruitment. No fibrillations or positive waves were noted. The biceps muscle reveals 1 to 2 K units with full recruitment. No fibrillations or positive waves were noted. The triceps muscle reveals 2  to 4 K units with slightly reduced recruitment. No fibrillations or positive waves were noted. The anterior deltoid muscle reveals 2 to 3 K units with full recruitment. No fibrillations or positive waves were noted. The cervical paraspinal muscles were tested at 2 levels. No abnormalities of insertional activity were seen at either level tested. There was poor relaxation.   IMPRESSION:  Nerve conduction studies done on both upper extremities in the left lower extremity shows evidence of a relatively severe primarily axonal peripheral neuropathy.  EMG evaluation of the left upper extremity is relatively unremarkable, there is no evidence of an overlying cervical radiculopathy.  Marlan Palau MD 06/29/2018 9:24 AM  [facility] [address] [place], [place] [address]  Phone [phone] Fax [phone]

## 2018-06-29 NOTE — Progress Notes (Addendum)
The patient comes in today for EMG nerve conduction study evaluation.  He had complained of some dysfunction of the left arm previously, there is evidence of the underlying peripheral neuropathy, no evidence of a left cervical radiculopathy.  He complains of sexual dysfunction, we will give him a trial on Viagra taking 50 mg daily if needed.     MNC    Nerve / Sites Muscle Latency Ref. Amplitude Ref. Rel Amp Segments Distance Velocity Ref. Area    ms ms mV mV %  cm m/s m/s mVms  L Median - APB     Wrist APB 3.9 ?4.4 6.5 ?4.0 100 Wrist - APB 7   28.3     Upper arm APB 8.8  6.4  98.8 Upper arm - Wrist 24 50 ?49 28.9  R Median - APB     Wrist APB 4.3 ?4.4 5.4 ?4.0 100 Wrist - APB 7   17.1     Upper arm APB 9.2  4.8  88.5 Upper arm - Wrist 24 49 ?49 19.9  L Ulnar - ADM     Wrist ADM 3.7 ?3.3 11.3 ?6.0 100 Wrist - ADM 7   36.9     B.Elbow ADM 8.3  10.3  91.3 B.Elbow - Wrist 22 47 ?49 37.1     A.Elbow ADM 10.5  9.9  96.1 A.Elbow - B.Elbow 10 46 ?49 36.7         A.Elbow - Wrist      R Ulnar - ADM     Wrist ADM 3.3 ?3.3 10.6 ?6.0 100 Wrist - ADM 7   34.4     B.Elbow ADM 8.0  9.3  87.5 B.Elbow - Wrist 22 46 ?49 31.8     A.Elbow ADM 10.2  9.3  100 A.Elbow - B.Elbow 10 46 ?49 33.3         A.Elbow - Wrist      L Peroneal - EDB     Ankle EDB NR ?6.5 NR ?2.0 NR Ankle - EDB 9   NR     Fib head EDB NR  NR  NR Fib head - Ankle   ?44 NR  L Tibial - AH     Ankle AH NR ?5.8 NR ?4.0 NR Ankle - AH 9   NR     Pop fossa AH NR  NR  NR Pop fossa - Ankle   ?41 NR                   SNC    Nerve / Sites Rec. Site Peak Lat Ref.  Amp Ref. Segments Distance    ms ms V V  cm  L Radial - Anatomical snuff box (Forearm)     Forearm Wrist 2.8 ?2.9 12 ?15 Forearm - Wrist 10  R Radial - Anatomical snuff box (Forearm)     Forearm Wrist 2.7 ?2.9 11 ?15 Forearm - Wrist 10  L Sural - Ankle (Calf)     Calf Ankle NR ?4.4 NR ?6 Calf - Ankle 14  L Superficial peroneal - Ankle     Lat leg Ankle NR ?4.4 NR ?6 Lat  leg - Ankle 14  L Median - Orthodromic (Dig II, Mid palm)     Dig II Wrist 3.7 ?3.4 6 ?10 Dig II - Wrist 13  R Median - Orthodromic (Dig II, Mid palm)     Dig II Wrist 3.3 ?3.4 9 ?10 Dig II - Wrist 13  L Ulnar - Orthodromic, (  Dig V, Mid palm)     Dig V Wrist 3.8 ?3.1 1 ?5 Dig V - Wrist 11  R Ulnar - Orthodromic, (Dig V, Mid palm)     Dig V Wrist 3.4 ?3.1 4 ?5 Dig V - Wrist 45                     F  Wave    Nerve F Lat Ref.   ms ms  L Ulnar - ADM 35.5 ?32.0  R Ulnar - ADM 35.1 ?32.0

## 2018-10-05 ENCOUNTER — Telehealth: Payer: Self-pay

## 2018-10-05 NOTE — Telephone Encounter (Signed)
The patient does not report any discomfort in the legs, nothing keeping awake at night in this regard, therefore, no indication for use of gabapentin which may worsen balance.

## 2018-10-05 NOTE — Telephone Encounter (Signed)
Pt presented to the clinic today to discuss a recent home visit from a [facility] Nurse. Pt was seen by the nurse on 09/11/2018 and he was advised during the visit Gabapentin might be a beneficial drug form him due to his dx of neuropathy.  I asked the pt if he was having pain/tingling/numbness in feet or hands and he denied any of these symptoms. Pt states his feet have been feeling different (has a hard time moving them).  I advised I would check with Dr. Anne Hahn on this medication and we would contact him back. Pt was agreeable.

## 2018-11-19 ENCOUNTER — Encounter (HOSPITAL_COMMUNITY): Payer: Self-pay | Admitting: Emergency Medicine

## 2018-11-19 ENCOUNTER — Emergency Department (HOSPITAL_COMMUNITY)
Admission: EM | Admit: 2018-11-19 | Discharge: 2018-11-19 | Disposition: A | Discharge status: Home / Self Care | Payer: Medicare Other | Attending: Emergency Medicine | Admitting: Emergency Medicine

## 2018-11-19 ENCOUNTER — Other Ambulatory Visit: Payer: Self-pay

## 2018-11-19 DIAGNOSIS — F17210 Nicotine dependence, cigarettes, uncomplicated: Secondary | ICD-10-CM | POA: Diagnosis not present

## 2018-11-19 DIAGNOSIS — R197 Diarrhea, unspecified: Secondary | ICD-10-CM

## 2018-11-19 DIAGNOSIS — Z79899 Other long term (current) drug therapy: Secondary | ICD-10-CM | POA: Insufficient documentation

## 2018-11-19 DIAGNOSIS — R42 Dizziness and giddiness: Secondary | ICD-10-CM | POA: Insufficient documentation

## 2018-11-19 DIAGNOSIS — E860 Dehydration: Secondary | ICD-10-CM | POA: Insufficient documentation

## 2018-11-19 LAB — COMPREHENSIVE METABOLIC PANEL
ALBUMIN: 3.5 g/dL (ref 3.5–5.0)
ALK PHOS: 68 U/L (ref 38–126)
ALT: 16 U/L (ref 0–44)
ANION GAP: 8 (ref 5–15)
AST: 29 U/L (ref 15–41)
BILIRUBIN TOTAL: 1.1 mg/dL (ref 0.3–1.2)
BUN: 26 mg/dL — AB (ref 8–23)
CALCIUM: 8.2 mg/dL — AB (ref 8.9–10.3)
CO2: 27 mmol/L (ref 22–32)
Chloride: 101 mmol/L (ref 98–111)
Creatinine, Ser: 0.76 mg/dL (ref 0.61–1.24)
GFR calc Af Amer: >60 mL/min (ref >60)
GFR calc non Af Amer: >60 mL/min (ref >60)
GLUCOSE: 120 mg/dL — AB (ref 70–99)
POTASSIUM: 5.2 mmol/L — AB (ref 3.5–5.1)
Sodium: 136 mmol/L (ref 135–145)
TOTAL PROTEIN: 6.5 g/dL (ref 6.5–8.1)

## 2018-11-19 LAB — CBC
HCT: 53.3 % — ABNORMAL HIGH (ref 39.0–52.0)
HEMOGLOBIN: 16.5 g/dL (ref 13.0–17.0)
MCH: 31.2 pg (ref 26.0–34.0)
MCHC: 31 g/dL (ref 30.0–36.0)
MCV: 100.8 fL — AB (ref 80.0–100.0)
Platelets: 158 10*3/uL (ref 150–400)
RBC: 5.29 MIL/uL (ref 4.22–5.81)
RDW: 14.5 % (ref 11.5–15.5)
WBC: 11.8 10*3/uL — ABNORMAL HIGH (ref 4.0–10.5)
nRBC: 0 % (ref 0.0–0.2)

## 2018-11-19 LAB — URINALYSIS, ROUTINE W REFLEX MICROSCOPIC
Bilirubin Urine: NEGATIVE
Glucose, UA: NEGATIVE mg/dL
Hgb urine dipstick: NEGATIVE
KETONES UR: NEGATIVE mg/dL
Nitrite: NEGATIVE
PROTEIN: NEGATIVE mg/dL
Specific Gravity, Urine: 1.018 (ref 1.005–1.030)
pH: 5 (ref 5.0–8.0)

## 2018-11-19 LAB — LIPASE, BLOOD: Lipase: 26 U/L (ref 11–51)

## 2018-11-19 MED ORDER — LOPERAMIDE HCL 2 MG PO CAPS: 2.0000 mg | ORAL_CAPSULE | Freq: Four times a day (QID) | ORAL | 0 refills | Status: DC | PRN

## 2018-11-19 MED ORDER — SODIUM CHLORIDE 0.9 % IV BOLUS
1000.0000 mL | Freq: Once | INTRAVENOUS | Status: AC
  Administered 2018-11-19: 1000 mL via INTRAVENOUS

## 2018-11-19 MED ORDER — SODIUM CHLORIDE 0.9 % IV BOLUS
500.0000 mL | Freq: Once | INTRAVENOUS | Status: AC
  Administered 2018-11-19: 500 mL via INTRAVENOUS

## 2018-11-19 MED ORDER — SODIUM CHLORIDE 0.9% FLUSH: 3.0000 mL | Freq: Once | INTRAVENOUS | Status: DC

## 2018-11-19 MED ORDER — LOPERAMIDE HCL 2 MG PO CAPS
2.0000 mg | ORAL_CAPSULE | Freq: Once | ORAL | Status: AC
Start: 2018-11-19 — End: 2018-11-19
  Administered 2018-11-19: 2 mg via ORAL
  Filled 2018-11-19: qty 1

## 2018-11-19 NOTE — ED Triage Notes (Signed)
Pt with several episodes of diarrhea since this morning. Pt received fluid bolus of by EMS

## 2018-11-19 NOTE — ED Notes (Signed)
Pt is alert and oriented x 4 and is verbally responsive/ Pt denies pain at this time. Pt observed drinking water and had a coughing spell.

## 2018-11-19 NOTE — ED Provider Notes (Signed)
[facility]-EMERGENCY DEPT Provider Note   CSN: [id number] Arrival date & time: 11/19/18  1004    History   Chief Complaint Chief Complaint  Patient presents with  . Diarrhea    HPI Hector Beltran is a 85 y.o. male.     The history is provided by the patient and medical records. No language interpreter was used.  Diarrhea  Quality:  Watery Severity:  Severe Onset quality:  Sudden Duration:  1 day Timing:  Constant Progression:  Unchanged Relieved by:  Nothing Worsened by:  Nothing Ineffective treatments:  None tried Associated symptoms: no abdominal pain, no arthralgias, no chills, no recent cough, no diaphoresis, no fever, no headaches, no myalgias and no vomiting     Past Medical History:  Diagnosis Date  . Peripheral neuropathy 09/30/2017    Patient Active Problem List   Diagnosis Date Noted  . Peripheral neuropathy 09/30/2017    Past Surgical History:  Procedure Laterality Date  . CYST REMOVAL NECK          Home Medications    Prior to Admission medications   Medication Sig Start Date End Date Taking? Authorizing Provider  Multiple Vitamin (MULTIVITAMIN WITH MINERALS) TABS tablet Take 1 tablet by mouth daily.    [provider]  sildenafil (VIAGRA) 50 MG tablet Take 1 tablet (50 mg total) by mouth daily as needed for erectile dysfunction. 06/29/18   York Spaniel, MD    Family History No family history on file.  Social History Social History   Tobacco Use  . Smoking status: Current Every Day Smoker    Types: Cigarettes  . Smokeless tobacco: Never Used  . Tobacco comment: 2 packs per week  Substance Use Topics  . Alcohol use: Yes    Comment: occasionally  . Drug use: No     Allergies   Patient has no known allergies.   Review of Systems Review of Systems  Constitutional: Positive for fatigue. Negative for chills, diaphoresis and fever.  HENT: Negative for congestion and rhinorrhea.   Respiratory:  Negative for cough, chest tightness, shortness of breath and wheezing.   Cardiovascular: Negative for chest pain, palpitations and leg swelling.  Gastrointestinal: Positive for diarrhea. Negative for abdominal pain, blood in stool, constipation, nausea and vomiting.  Genitourinary: Negative for dysuria, flank pain and frequency.  Musculoskeletal: Negative for arthralgias, back pain and myalgias.  Skin: Negative for rash and wound.  Neurological: Positive for light-headedness. Negative for dizziness, syncope, numbness and headaches.  Psychiatric/Behavioral: Negative for agitation and confusion.  All other systems reviewed and are negative.    Physical Exam Updated Vital Signs BP (!) 131/91 (BP Location: Right Arm)   Pulse (!) 109   Temp 99.4 F (37.4 C) (Oral)   Resp 16   SpO2 93%   Physical Exam Vitals signs and nursing note reviewed.  Constitutional:      General: He is not in acute distress.    Appearance: He is well-developed. He is not ill-appearing, toxic-appearing or diaphoretic.  HENT:     Head: Normocephalic and atraumatic.     Nose: Nose normal. No congestion or rhinorrhea.     Mouth/Throat:     Mouth: Mucous membranes are dry.     Pharynx: No oropharyngeal exudate or posterior oropharyngeal erythema.  Eyes:     Conjunctiva/sclera: Conjunctivae normal.     Pupils: Pupils are equal, round, and reactive to light.  Neck:     Musculoskeletal: Neck supple. No muscular tenderness.  Cardiovascular:     Rate and Rhythm: Regular rhythm. Tachycardia present.     Pulses: Normal pulses.     Heart sounds: No murmur.  Pulmonary:     Effort: Pulmonary effort is normal. No respiratory distress.     Breath sounds: Normal breath sounds. No wheezing, rhonchi or rales.  Chest:     Chest wall: No tenderness.  Abdominal:     General: There is no distension.     Palpations: Abdomen is soft. There is no mass.     Tenderness: There is no abdominal tenderness.     Hernia: No hernia  is present.  Musculoskeletal:        General: No tenderness.     Right lower leg: No edema.     Left lower leg: No edema.  Skin:    General: Skin is warm and dry.     Capillary Refill: Capillary refill takes less than 2 seconds.     Findings: No erythema or rash.  Neurological:     General: No focal deficit present.     Mental Status: He is alert and oriented to person, place, and time.     Sensory: No sensory deficit.     Motor: No weakness.  Psychiatric:        Mood and Affect: Mood normal.      ED Treatments / Results  Labs (all labs ordered are listed, but only abnormal results are displayed) Labs Reviewed  COMPREHENSIVE METABOLIC PANEL - Abnormal; Notable for the following components:      Result Value   Potassium 5.2 (*)    Glucose, Bld 120 (*)    BUN 26 (*)    Calcium 8.2 (*)    All other components within normal limits  CBC - Abnormal; Notable for the following components:   WBC 11.8 (*)    HCT 53.3 (*)    MCV 100.8 (*)    All other components within normal limits  URINALYSIS, ROUTINE W REFLEX MICROSCOPIC - Abnormal; Notable for the following components:   Leukocytes,Ua LARGE (*)    Bacteria, UA RARE (*)    All other components within normal limits  LIPASE, BLOOD    EKG None  Radiology No results found.  Procedures Procedures (including critical care time)  Medications Ordered in ED Medications  sodium chloride flush (NS) 0.9 % injection 3 mL (has no administration in time range)  sodium chloride 0.9 % bolus 1,000 mL (0 mLs Intravenous Stopped 11/19/18 1505)  sodium chloride 0.9 % bolus 500 mL (500 mLs Intravenous New Bag/Given 11/19/18 1507)  loperamide (IMODIUM) capsule 2 mg (2 mg Oral Given 11/19/18 1506)     Initial Impression / Assessment and Plan / ED Course  I have reviewed the triage vital signs and the nursing notes.  Pertinent labs & imaging results that were available during my care of the patient were reviewed by me and considered in my  medical decision making (see chart for details).        Hector Beltran is a 85 y.o. male with no significant past medical history who presents with lightheadedness and diarrhea.  Patient reports that he has been normal until this morning when he woke up having severe diarrhea.  He was reported multiple episodes.  It was nonbloody.  He denies fevers or chills.  No nausea, vomiting, or urinary symptoms.  No abdominal pain, chest pain, back pain, neck pain, or headache.  No recent injuries.  He lives at a nursing  facility and feels that other people likely had GI bugs.  He denies URI symptoms such as rhinorrhea, congestion, or cough.  He denies taking any medicines help with symptoms.  He is feeling lightheaded but has not had any syncopal episodes.  On exam, patient is tachycardic on arrival.  Patient's lungs are clear and chest is nontender.  Abdomen is nontender on exam.  No lower extremity tenderness or edema seen.  Patient resting comfortably but is slightly tachycardic.  Clinical aspect patient is slightly dehydrated from the diarrhea today.  Patient had dry mucous membranes on oropharyngeal exam.  Patient will be monitored on telemetry and have both IV fluids provided and have screening laboratory testing.  Vital signs improved and work-up is reassuring, anticipate he may be a candidate for discharge home.  Anticipate reassessment.  Patient's laboratory testing was overall reassuring.  Patient did have more episodes of diarrhea here.  Had a sure decision made conversation with patient and he reports he has tolerated Lodine in the past.  Patient was given a low dose of Imodium and will be observed with more fluids.  Patient's diarrhea resolved.  Patient will be discharged home for outpatient management given his reassuring evaluation and his ability to maintain hydration without nausea or vomiting.  Patient will follow-up with his PCP.  Patient with questions or concerns and will be  discharged.   Final Clinical Impressions(s) / ED Diagnoses   Final diagnoses:  Diarrhea, unspecified type    ED Discharge Orders         Ordered    loperamide (IMODIUM) 2 MG capsule  4 times daily PRN     11/19/18 1540          Clinical Impression: 1. Diarrhea, unspecified type     Disposition: Discharge  Condition: Good  I have discussed the results, Dx and Tx plan with the pt(& family if present). He/she/they expressed understanding and agree(s) with the plan. Discharge instructions discussed at great length. Strict return precautions discussed and pt &/or family have verbalized understanding of the instructions. No further questions at time of discharge.    New Prescriptions   LOPERAMIDE (IMODIUM) 2 MG CAPSULE    Take 1 capsule (2 mg total) by mouth 4 (four) times daily as needed for diarrhea or loose stools.    Follow Up: Renford Dills, MD [address] [place] [address] [phone]     [facility] [address] [id number] mc [address] [place] [address] [phone]       Miria Cappelli, Canary Brim, MD 11/19/18 1600

## 2018-11-19 NOTE — ED Notes (Signed)
Pt has had 3 episodes of diarrhea with in the past 15 mins. Provider made aware.

## 2018-11-19 NOTE — ED Notes (Signed)
Bed: WA06 Expected date:  Expected time:  Means of arrival:  Comments: EMS-diarrhea

## 2018-11-19 NOTE — Discharge Instructions (Addendum)
Your work-up and exam today was overall reassuring.  Your oxygen saturation was in the upper 80s and low 90s as you reported chronically is.  You otherwise had no complaints aside from the diarrhea.  As you are able to eat and drink and maintain hydration and your heart rate improved with the fluids, we feel you are safe for discharge home.  We used the medication Imodium with you today and you had improvement in your diarrhea.  He did not develop abdominal pain or other complaints.  We feel you are safe to take this medicine at home.  Please stay hydrated and follow-up with your primary doctor in the next several days.  If any symptoms change or worsen, please return to the nearest emergency department.

## 2018-11-20 ENCOUNTER — Emergency Department (HOSPITAL_COMMUNITY): Payer: Medicare Other

## 2018-11-20 ENCOUNTER — Observation Stay (HOSPITAL_COMMUNITY)
Admission: EM | Admit: 2018-11-20 | Discharge: 2018-11-22 | Disposition: A | Discharge status: Home Health | Payer: Medicare Other | Attending: Internal Medicine | Admitting: Internal Medicine

## 2018-11-20 ENCOUNTER — Other Ambulatory Visit: Payer: Self-pay

## 2018-11-20 DIAGNOSIS — A0811 Acute gastroenteropathy due to Norwalk agent: Secondary | ICD-10-CM | POA: Diagnosis present

## 2018-11-20 DIAGNOSIS — R197 Diarrhea, unspecified: Secondary | ICD-10-CM | POA: Diagnosis present

## 2018-11-20 DIAGNOSIS — R2681 Unsteadiness on feet: Secondary | ICD-10-CM | POA: Insufficient documentation

## 2018-11-20 DIAGNOSIS — Z7901 Long term (current) use of anticoagulants: Secondary | ICD-10-CM | POA: Diagnosis not present

## 2018-11-20 DIAGNOSIS — G629 Polyneuropathy, unspecified: Secondary | ICD-10-CM | POA: Diagnosis not present

## 2018-11-20 DIAGNOSIS — R531 Weakness: Secondary | ICD-10-CM | POA: Insufficient documentation

## 2018-11-20 DIAGNOSIS — R159 Full incontinence of feces: Secondary | ICD-10-CM | POA: Insufficient documentation

## 2018-11-20 DIAGNOSIS — F17210 Nicotine dependence, cigarettes, uncomplicated: Secondary | ICD-10-CM | POA: Insufficient documentation

## 2018-11-20 LAB — CBC WITH DIFFERENTIAL/PLATELET
Abs Immature Granulocytes: 0.04 10*3/uL (ref 0.00–0.07)
Basophils Absolute: 0 10*3/uL (ref 0.0–0.1)
Basophils Relative: 0 %
Eosinophils Absolute: 0.1 10*3/uL (ref 0.0–0.5)
Eosinophils Relative: 1 %
HCT: 47.2 % (ref 39.0–52.0)
Hemoglobin: 15.2 g/dL (ref 13.0–17.0)
Immature Granulocytes: 0 %
Lymphocytes Relative: 6 %
Lymphs Abs: 0.6 10*3/uL — ABNORMAL LOW (ref 0.7–4.0)
MCH: 31.2 pg (ref 26.0–34.0)
MCHC: 32.2 g/dL (ref 30.0–36.0)
MCV: 96.9 fL (ref 80.0–100.0)
Monocytes Absolute: 0.6 10*3/uL (ref 0.1–1.0)
Monocytes Relative: 6 %
Neutro Abs: 8.3 10*3/uL — ABNORMAL HIGH (ref 1.7–7.7)
Neutrophils Relative %: 87 %
Platelets: 149 10*3/uL — ABNORMAL LOW (ref 150–400)
RBC: 4.87 MIL/uL (ref 4.22–5.81)
RDW: 14.7 % (ref 11.5–15.5)
WBC: 9.6 10*3/uL (ref 4.0–10.5)
nRBC: 0 % (ref 0.0–0.2)

## 2018-11-20 LAB — COMPREHENSIVE METABOLIC PANEL
ALT: 24 U/L (ref 0–44)
ANION GAP: 10 (ref 5–15)
AST: 42 U/L — ABNORMAL HIGH (ref 15–41)
Albumin: 3.1 g/dL — ABNORMAL LOW (ref 3.5–5.0)
Alkaline Phosphatase: 50 U/L (ref 38–126)
BUN: 28 mg/dL — ABNORMAL HIGH (ref 8–23)
CO2: 21 mmol/L — ABNORMAL LOW (ref 22–32)
Calcium: 8 mg/dL — ABNORMAL LOW (ref 8.9–10.3)
Chloride: 107 mmol/L (ref 98–111)
Creatinine, Ser: 0.93 mg/dL (ref 0.61–1.24)
GFR calc Af Amer: >60 mL/min (ref >60)
GFR calc non Af Amer: >60 mL/min (ref >60)
Glucose, Bld: 92 mg/dL (ref 70–99)
Potassium: 4.2 mmol/L (ref 3.5–5.1)
Sodium: 138 mmol/L (ref 135–145)
Total Bilirubin: 1 mg/dL (ref 0.3–1.2)
Total Protein: 6 g/dL — ABNORMAL LOW (ref 6.5–8.1)

## 2018-11-20 MED ORDER — SODIUM CHLORIDE 0.9 % IV SOLN
INTRAVENOUS | Status: DC
  Administered 2018-11-21: 09:00:00 via INTRAVENOUS

## 2018-11-20 MED ORDER — SODIUM CHLORIDE 0.9 % IV BOLUS
500.0000 mL | Freq: Once | INTRAVENOUS | Status: AC
  Administered 2018-11-20: 500 mL via INTRAVENOUS

## 2018-11-20 NOTE — ED Provider Notes (Addendum)
[facility] EMERGENCY DEPARTMENT Provider Note   CSN: [id number] Arrival date & time: 11/20/18  1629    History   Chief Complaint Chief Complaint  Patient presents with  . Diarrhea    HPI Hector Beltran is a 85 y.o. male.     The history is provided by the patient and medical records. No language interpreter was used.  Diarrhea   Hector Beltran is a 85 y.o. male who presents to the Emergency Department complaining of diarrhea. Presents to the emergency department by EMS for evaluation of diarrhea. He is a resident of [facility] independent living and developed significant watery diarrhea yesterday. The patient reports about 10 watery bowel movements yesterday and three today. He does report feeling a little bit weak. He denies any fevers, nausea, vomiting, shortness of breath, abdominal pain, hematochezia. EMS reports that there was feces all over his residence and he did not appear appropriate for independent living. Symptoms are severe and constant nature. Past Medical History:  Diagnosis Date  . Peripheral neuropathy 09/30/2017    Patient Active Problem List   Diagnosis Date Noted  . Peripheral neuropathy 09/30/2017    Past Surgical History:  Procedure Laterality Date  . CYST REMOVAL NECK          Home Medications    Prior to Admission medications   Medication Sig Start Date End Date Taking? Authorizing Provider  loperamide (IMODIUM) 2 MG capsule Take 1 capsule (2 mg total) by mouth 4 (four) times daily as needed for diarrhea or loose stools. Patient not taking: Reported on 11/20/2018 11/19/18   Tegeler, Canary Brim, MD  sildenafil (VIAGRA) 50 MG tablet Take 1 tablet (50 mg total) by mouth daily as needed for erectile dysfunction. Patient not taking: Reported on 11/19/2018 06/29/18   York Spaniel, MD    Family History No family history on file.  Social History Social History   Tobacco Use  . Smoking status: Current Every Day Smoker   Types: Cigarettes  . Smokeless tobacco: Never Used  . Tobacco comment: 2 packs per week  Substance Use Topics  . Alcohol use: Yes    Comment: occasionally  . Drug use: No     Allergies   Patient has no known allergies.   Review of Systems Review of Systems  Gastrointestinal: Positive for diarrhea.  All other systems reviewed and are negative.    Physical Exam Updated Vital Signs BP (!) 142/81   Pulse (!) 101   Temp 98 F (36.7 C) (Oral)   Resp (!) 21   Ht 6\' 3"  (1.905 m)   Wt 79.4 kg   SpO2 94%   BMI 21.87 kg/m   Physical Exam Vitals signs and nursing note reviewed.  Constitutional:      Appearance: He is well-developed.  HENT:     Head: Normocephalic and atraumatic.  Cardiovascular:     Rate and Rhythm: Normal rate and regular rhythm.     Heart sounds: No murmur.  Pulmonary:     Effort: Pulmonary effort is normal. No respiratory distress.     Breath sounds: Normal breath sounds.     Comments: tachypneic Abdominal:     Palpations: Abdomen is soft.     Tenderness: There is no abdominal tenderness. There is no guarding or rebound.  Musculoskeletal:        General: No tenderness.     Comments: 2+ DP pulses bilaterally.  Chronic venous stasis changes to BLE  Skin:  General: Skin is warm and dry.     Comments: Erythematous rash to lower rib margins (patient states this is chronic)  Neurological:     Mental Status: He is alert and oriented to person, place, and time.  Psychiatric:        Behavior: Behavior normal.      ED Treatments / Results  Labs (all labs ordered are listed, but only abnormal results are displayed) Labs Reviewed  COMPREHENSIVE METABOLIC PANEL - Abnormal; Notable for the following components:      Result Value   CO2 21 (*)    BUN 28 (*)    Calcium 8.0 (*)    Total Protein 6.0 (*)    Albumin 3.1 (*)    AST 42 (*)    All other components within normal limits  CBC WITH DIFFERENTIAL/PLATELET - Abnormal; Notable for the following  components:   Platelets 149 (*)    Neutro Abs 8.3 (*)    Lymphs Abs 0.6 (*)    All other components within normal limits  C DIFFICILE QUICK SCREEN W PCR REFLEX  GASTROINTESTINAL PANEL BY PCR, STOOL (REPLACES STOOL CULTURE)  CBC WITH DIFFERENTIAL/PLATELET    EKG None  Radiology Dg Chest 2 View  Result Date: 11/20/2018 CLINICAL DATA:  Tachypnea, diarrhea EXAM: CHEST - 2 VIEW COMPARISON:  10/07/2017 FINDINGS: There is hyperinflation of the lungs compatible with COPD. Cardiomegaly. No confluent airspace opacities or effusions. No acute bony abnormality. Stable mild compression fracture in the midthoracic spine. IMPRESSION: Cardiomegaly.  COPD.  No active disease. Electronically Signed   By: Charlett Nose M.D.   On: 11/20/2018 20:17    Procedures Procedures (including critical care time)  Medications Ordered in ED Medications  sodium chloride 0.9 % bolus 500 mL (0 mLs Intravenous Stopped 11/20/18 1928)  sodium chloride 0.9 % bolus 500 mL (0 mLs Intravenous Stopped 11/20/18 2159)     Initial Impression / Assessment and Plan / ED Course  I have reviewed the triage vital signs and the nursing notes.  Pertinent labs & imaging results that were available during my care of the patient were reviewed by me and considered in my medical decision making (see chart for details).       Pt since to the emergency department from independent living for evaluation of diarrhea. This is the second day of illness. Labs without significant electrolyte arrangement. Patient does have mild tachycardia. During his ED stay he did develop recurrent profound diarrhea with bowel incontinence, covering himself and stool. He required staff assistance to get cleaned up. Contacted nursing facility, they state that he cannot return because he is an independent living in there is stool all over his residence. Medicine consulted for observation for diarrhea.  Final Clinical Impressions(s) / ED Diagnoses   Final  diagnoses:  Diarrhea, unspecified type    ED Discharge Orders    None       Tilden Fossa, MD 11/20/18 2213    Tilden Fossa, MD 11/20/18 2220

## 2018-11-20 NOTE — ED Notes (Signed)
Pt states he ambulates with a walker at his baseline. Walker provided for pt to ambulate down the hall.  Pt ambulated with steady gait, x2 RN standby assist in case necessary. Pt able to ambulate at a brisk pace down the hall with no complaints and without difficulty.

## 2018-11-20 NOTE — ED Notes (Addendum)
Family member requested to speak to this RN. Patient family member wondering why we were going to discharge patient. Explained to the family member that we are aware of the facility not wanting the patient to return. The family member also expressed concern about the patient being discharged and being able to independently care for himself. Let the family member know that we are currently waiting on admitting to decide if the patient will be admitted or not.

## 2018-11-20 NOTE — ED Triage Notes (Signed)
Pt complains on three episodes of diarrhea today. Seen at [facility] yesterday for diarrhea and prescribed imodium, which he has not picked up yet.

## 2018-11-21 ENCOUNTER — Encounter (HOSPITAL_COMMUNITY): Payer: Self-pay | Admitting: Internal Medicine

## 2018-11-21 DIAGNOSIS — R197 Diarrhea, unspecified: Secondary | ICD-10-CM | POA: Diagnosis present

## 2018-11-21 LAB — C DIFFICILE QUICK SCREEN W PCR REFLEX
C Diff antigen: NEGATIVE
C Diff interpretation: NOT DETECTED
C Diff toxin: NEGATIVE

## 2018-11-21 LAB — BASIC METABOLIC PANEL
Anion gap: 7 (ref 5–15)
BUN: 24 mg/dL — ABNORMAL HIGH (ref 8–23)
CHLORIDE: 108 mmol/L (ref 98–111)
CO2: 24 mmol/L (ref 22–32)
Calcium: 7.9 mg/dL — ABNORMAL LOW (ref 8.9–10.3)
Creatinine, Ser: 0.91 mg/dL (ref 0.61–1.24)
GFR calc Af Amer: >60 mL/min (ref >60)
GFR calc non Af Amer: >60 mL/min (ref >60)
Glucose, Bld: 110 mg/dL — ABNORMAL HIGH (ref 70–99)
POTASSIUM: 3.5 mmol/L (ref 3.5–5.1)
Sodium: 139 mmol/L (ref 135–145)

## 2018-11-21 LAB — GASTROINTESTINAL PANEL BY PCR, STOOL (REPLACES STOOL CULTURE)
Adenovirus F40/41: NOT DETECTED
Astrovirus: NOT DETECTED
Campylobacter species: NOT DETECTED
Cryptosporidium: NOT DETECTED
Cyclospora cayetanensis: NOT DETECTED
Entamoeba histolytica: NOT DETECTED
Enteroaggregative E coli (EAEC): NOT DETECTED
Enteropathogenic E coli (EPEC): NOT DETECTED
Enterotoxigenic E coli (ETEC): NOT DETECTED
Giardia lamblia: NOT DETECTED
Norovirus GI/GII: DETECTED — AB
Plesimonas shigelloides: NOT DETECTED
ROTAVIRUS A: NOT DETECTED
SHIGA LIKE TOXIN PRODUCING E COLI (STEC): NOT DETECTED
Salmonella species: NOT DETECTED
Sapovirus (I, II, IV, and V): NOT DETECTED
Shigella/Enteroinvasive E coli (EIEC): NOT DETECTED
Vibrio cholerae: NOT DETECTED
Vibrio species: NOT DETECTED
Yersinia enterocolitica: NOT DETECTED

## 2018-11-21 LAB — CBC
HCT: 44.8 % (ref 39.0–52.0)
Hemoglobin: 14.1 g/dL (ref 13.0–17.0)
MCH: 30.5 pg (ref 26.0–34.0)
MCHC: 31.5 g/dL (ref 30.0–36.0)
MCV: 97 fL (ref 80.0–100.0)
NRBC: 0 % (ref 0.0–0.2)
Platelets: 149 10*3/uL — ABNORMAL LOW (ref 150–400)
RBC: 4.62 MIL/uL (ref 4.22–5.81)
RDW: 14.6 % (ref 11.5–15.5)
WBC: 8.5 10*3/uL (ref 4.0–10.5)

## 2018-11-21 MED ORDER — ONDANSETRON HCL 4 MG/2ML IJ SOLN: 4.0000 mg | Freq: Four times a day (QID) | INTRAMUSCULAR | Status: DC | PRN

## 2018-11-21 MED ORDER — ACETAMINOPHEN 650 MG RE SUPP: 650.0000 mg | Freq: Four times a day (QID) | RECTAL | Status: DC | PRN

## 2018-11-21 MED ORDER — ENOXAPARIN SODIUM 40 MG/0.4ML ~~LOC~~ SOLN
40.0000 mg | SUBCUTANEOUS | Status: DC
  Administered 2018-11-21: 40 mg via SUBCUTANEOUS
  Filled 2018-11-21: qty 0.4

## 2018-11-21 MED ORDER — ONDANSETRON HCL 4 MG PO TABS: 4.0000 mg | ORAL_TABLET | Freq: Four times a day (QID) | ORAL | Status: DC | PRN

## 2018-11-21 MED ORDER — ACETAMINOPHEN 325 MG PO TABS: 650.0000 mg | ORAL_TABLET | Freq: Four times a day (QID) | ORAL | Status: DC | PRN

## 2018-11-21 NOTE — Progress Notes (Signed)
Pt from [facility]. CSW called and spoke with Will at [facility]. They are able to take back once they know what type of virus caused Mr. Dorfman's bouts of diarrhea.   Patient has a stool sample in process. Will also need home health orders.   CSW will continue to follow.   Drucilla Schmidt, MSW, LCSW-A Clinical Social Worker [employer]

## 2018-11-21 NOTE — Progress Notes (Addendum)
Patient admitted over midnight.  He was seen and agree with overall assessment and plan.  Patient reports diarrhea symptoms have ceased and that has not had episode of diarrhea since last night.  C. difficile negative and positive for norovirus.  Patient eating and drinking normally at this time.  Electrolytes and kidney function stable.  Discontinued IV fluids.  Social workconsulted plan for patient to likely be discharged back to [facility] independent living facility with home health physical therapy.

## 2018-11-21 NOTE — Evaluation (Signed)
Physical Therapy Evaluation Patient Details Name: Hector Beltran MRN: [id number] DOB: 1927/11/24 Today's Date: 11/21/2018   History of Present Illness  85 y.o. male with medical history significant of peripheral neuropathy.  Lives in ILF.  He returns to the ED with ongoing, severe, watery diarrhea and fecal incontinence  Clinical Impression  Orders received for PT evaluation. Patient demonstrates deficits in functional mobility as indicated below. Will benefit from continued skilled PT to address deficits and maximize function. Will see as indicated and progress as tolerated.  At this time, anticipate patient will progress well with mobility. Continue to recommend use of rollator upon discharge to the community.    Follow Up Recommendations Home health PT;Supervision - Intermittent    Equipment Recommendations  None recommended by PT    Recommendations for Other Services       Precautions / Restrictions Precautions Precautions: Fall      Mobility  Bed Mobility Overal bed mobility: Independent                Transfers Overall transfer level: Needs assistance Equipment used: Rolling walker (2 wheeled) Transfers: Sit to/from Stand Sit to Stand: Supervision         General transfer comment: supervision for safety and line management due to impuslivity, no physical assist required  Ambulation/Gait Ambulation/Gait assistance: Min guard Gait Distance (Feet): 180 Feet Assistive device: Rolling walker (2 wheeled) Gait Pattern/deviations: Drifts right/left;Step-through pattern;Trunk flexed;Narrow base of support Gait velocity: decreased Gait velocity interpretation: 1.31 - 2.62 ft/sec, indicative of limited community ambulator General Gait Details: patient with some modest instability, at times staggering from impulsivity in an enclosed environment  Stairs            Wheelchair Mobility    Modified Rankin (Stroke Patients Only)       Balance Overall balance  assessment: Mild deficits observed, not formally tested                                           Pertinent Vitals/Pain Pain Assessment: No/denies pain    Home Living Family/patient expects to be discharged to:: Private residence Living Arrangements: Alone   Type of Home: Independent living facility([facility]) Home Access: Level entry     Home Layout: One level Home Equipment: Environmental consultant - 4 wheels      Prior Function Level of Independence: Independent with assistive device(s)               Hand Dominance   Dominant Hand: Right    Extremity/Trunk Assessment   Upper Extremity Assessment Upper Extremity Assessment: Overall WFL for tasks assessed    Lower Extremity Assessment Lower Extremity Assessment: Overall WFL for tasks assessed       Communication      Cognition Arousal/Alertness: Awake/alert Behavior During Therapy: Impulsive Overall Cognitive Status: No family/caregiver present to determine baseline cognitive functioning                                        General Comments      Exercises Other Exercises Other Exercises: Squats in RW 5x Other Exercises: LAQs bilaterally x10 Other Exercises: Marching in seated position 20 reps Other Exercises: educated on SLRs when return to supine   Assessment/Plan    PT Assessment Patient needs continued PT services  PT Problem List Decreased strength;Decreased activity tolerance;Decreased balance;Decreased mobility;Decreased coordination;Decreased safety awareness       PT Treatment Interventions DME instruction;Gait training;Functional mobility training;Therapeutic activities;Therapeutic exercise;Balance training;Patient/family education    PT Goals (Current goals can be found in the Care Plan section)  Acute Rehab PT Goals Patient Stated Goal: to get back home PT Goal Formulation: With patient Time For Goal Achievement: 12/05/18 Potential to Achieve Goals: Good     Frequency Min 3X/week   Barriers to discharge Decreased caregiver support      Co-evaluation               AM-PAC PT "6 Clicks" Mobility  Outcome Measure Help needed turning from your back to your side while in a flat bed without using bedrails?: None Help needed moving from lying on your back to sitting on the side of a flat bed without using bedrails?: A Little Help needed moving to and from a bed to a chair (including a wheelchair)?: A Little Help needed standing up from a chair using your arms (e.g., wheelchair or bedside chair)?: A Little Help needed to walk in hospital room?: A Little Help needed climbing 3-5 steps with a railing? : A Little 6 Click Score: 19    End of Session Equipment Utilized During Treatment: Gait belt Activity Tolerance: Patient tolerated treatment well Patient left: in chair;with call bell/phone within reach;with chair alarm set Nurse Communication: Mobility status PT Visit Diagnosis: Unsteadiness on feet (R26.81);Difficulty in walking, not elsewhere classified (R26.2)    Time: 9604-5409 PT Time Calculation (min) (ACUTE ONLY): 22 min   Charges:   PT Evaluation $PT Eval Moderate Complexity: 1 Mod          Charlotte Crumb, PT DPT  Board Certified Neurologic Specialist Acute Rehabilitation Services Pager [phone] Office [phone]   Fabio Asa 11/21/2018, 10:15 AM

## 2018-11-21 NOTE — H&P (Signed)
History and Physical    Hector Beltran LKG:[id number] DOB: 1928/03/21 DOA: 11/20/2018  PCP: Renford Dills, MD  Patient coming from: ILF  I have personally briefly reviewed patient's old medical records in [facility] Link  Chief Complaint: Diarrhea  HPI: Hector Beltran is a 85 y.o. male with medical history significant of peripheral neuropathy.  Lives in ILF.  He returns to the ED with ongoing, severe, watery diarrhea and fecal incontinence.  Symptoms onset yesterday, 3/6, when he woke up.  Feels other people at facility likely had GI bugs.  No URI symptoms, no cough, no N/V, no fever, no abd pain, no CP.  Just large volume of watery diarrhea.  Seen in ED yesterday, treated and sent home.  Symptoms have continued throughout the day today.  EMS called, noted that there was feces all over his residence and he did not appear appropriate for independent living.   ED Course: Continued to have several episodes of watery diarrhea in ED per report.  Put on IVF, Tm of 99.  Lab work is otherwise mostly unremarkable.   Review of Systems: As per HPI otherwise 10 point review of systems negative.   Past Medical History:  Diagnosis Date  . Peripheral neuropathy 09/30/2017    Past Surgical History:  Procedure Laterality Date  . CYST REMOVAL NECK       reports that he has been smoking cigarettes. He has never used smokeless tobacco. He reports current alcohol use. He reports that he does not use drugs.  No Known Allergies  History reviewed. No pertinent family history. No sick contacts in family, though numerous other members at living facility have had diarrhea.  Prior to Admission medications   Medication Sig Start Date End Date Taking? Authorizing Provider  loperamide (IMODIUM) 2 MG capsule Take 1 capsule (2 mg total) by mouth 4 (four) times daily as needed for diarrhea or loose stools. Patient not taking: Reported on 11/20/2018 11/19/18   Tegeler, Canary Brim, MD  sildenafil (VIAGRA) 50  MG tablet Take 1 tablet (50 mg total) by mouth daily as needed for erectile dysfunction. Patient not taking: Reported on 11/19/2018 06/29/18   York Spaniel, MD    Physical Exam: Vitals:   11/20/18 2245 11/20/18 2300 11/21/18 0000 11/21/18 0045  BP: (!) 148/92 (!) 136/99 137/88 (!) 141/70  Pulse: (!) 103 (!) 101 90 95  Resp: (!) 29 (!) 25 (!) 21 14  Temp:      TempSrc:      SpO2: 94% 95% (!) 86% 94%  Weight:      Height:        Constitutional: NAD, calm, comfortable Eyes: PERRL, lids and conjunctivae normal ENMT: Mucous membranes are moist. Posterior pharynx clear of any exudate or lesions.Normal dentition.  Neck: normal, supple, no masses, no thyromegaly Respiratory: clear to auscultation bilaterally, no wheezing, no crackles. Normal respiratory effort. No accessory muscle use.  Cardiovascular: Regular rate and rhythm, no murmurs / rubs / gallops. No extremity edema. 2+ pedal pulses. No carotid bruits.  Abdomen: no tenderness, no masses palpated. No hepatosplenomegaly. Bowel sounds positive.  Musculoskeletal: no clubbing / cyanosis. No joint deformity upper and lower extremities. Good ROM, no contractures. Normal muscle tone.  Skin: no rashes, lesions, ulcers. No induration Neurologic: CN 2-12 grossly intact. Sensation intact, DTR normal. Strength 5/5 in all 4.  Psychiatric: Normal judgment and insight. Alert and oriented x 3. Normal mood.    Labs on Admission: I have personally reviewed following labs and  imaging studies  CBC: Recent Labs  Lab 11/19/18 1048 11/20/18 2045  WBC 11.8* 9.6  NEUTROABS  --  8.3*  HGB 16.5 15.2  HCT 53.3* 47.2  MCV 100.8* 96.9  PLT 158 149*   Basic Metabolic Panel: Recent Labs  Lab 11/19/18 1048 11/20/18 1718  NA 136 138  K 5.2* 4.2  CL 101 107  CO2 27 21*  GLUCOSE 120* 92  BUN 26* 28*  CREATININE 0.76 0.93  CALCIUM 8.2* 8.0*   GFR: Estimated Creatinine Clearance: 60.5 mL/min (by C-G formula based on SCr of 0.93 mg/dL). Liver  Function Tests: Recent Labs  Lab 11/19/18 1048 11/20/18 1718  AST 29 42*  ALT 16 24  ALKPHOS 68 50  BILITOT 1.1 1.0  PROT 6.5 6.0*  ALBUMIN 3.5 3.1*   Recent Labs  Lab 11/19/18 1048  LIPASE 26   No results for input(s): AMMONIA in the last 168 hours. Coagulation Profile: No results for input(s): INR, PROTIME in the last 168 hours. Cardiac Enzymes: No results for input(s): CKTOTAL, CKMB, CKMBINDEX, TROPONINI in the last 168 hours. BNP (last 3 results) No results for input(s): PROBNP in the last 8760 hours. HbA1C: No results for input(s): HGBA1C in the last 72 hours. CBG: No results for input(s): GLUCAP in the last 168 hours. Lipid Profile: No results for input(s): CHOL, HDL, LDLCALC, TRIG, CHOLHDL, LDLDIRECT in the last 72 hours. Thyroid Function Tests: No results for input(s): TSH, T4TOTAL, FREET4, T3FREE, THYROIDAB in the last 72 hours. Anemia Panel: No results for input(s): VITAMINB12, FOLATE, FERRITIN, TIBC, IRON, RETICCTPCT in the last 72 hours. Urine analysis:    Component Value Date/Time   COLORURINE YELLOW 11/19/2018 1047   APPEARANCEUR CLEAR 11/19/2018 1047   LABSPEC 1.018 11/19/2018 1047   PHURINE 5.0 11/19/2018 1047   GLUCOSEU NEGATIVE 11/19/2018 1047   HGBUR NEGATIVE 11/19/2018 1047   BILIRUBINUR NEGATIVE 11/19/2018 1047   KETONESUR NEGATIVE 11/19/2018 1047   PROTEINUR NEGATIVE 11/19/2018 1047   NITRITE NEGATIVE 11/19/2018 1047   LEUKOCYTESUR LARGE (A) 11/19/2018 1047    Radiological Exams on Admission: Dg Chest 2 View  Result Date: 11/20/2018 CLINICAL DATA:  Tachypnea, diarrhea EXAM: CHEST - 2 VIEW COMPARISON:  10/07/2017 FINDINGS: There is hyperinflation of the lungs compatible with COPD. Cardiomegaly. No confluent airspace opacities or effusions. No acute bony abnormality. Stable mild compression fracture in the midthoracic spine. IMPRESSION: Cardiomegaly.  COPD.  No active disease. Electronically Signed   By: Charlett NoseKevin  Dover M.D.   On: 11/20/2018 20:17      EKG: Independently reviewed.  Assessment/Plan Principal Problem:   Diarrhea of presumed infectious origin    1. Diarrhea of presumed infectious origin - 1. large volume diarrhea and fecal incontinence, though seems to have resolved spontaneously for the moment in the ED according to patient 2. C.Diff and GI pathogen pnl ordered and pending 1. Though C.Diff felt less likely given no recent h/o Abx 2. And diarrhea was more watery and not particularly characteristic of it 3. IVF: 1L bolus in ED and 125 cc/hr NS 4. Strict intake and output, goal is slightly net positive 5. No abd pain, no N/V (patient actually asking if he can eat), dont really have a strong reason to CT his abdomen at this point. 6. Repeat CBC and BMP in AM 7. PT/OT evals due to family member's reported concern of ability to take care of self in ILF  DVT prophylaxis: Lovenox Code Status: Full Family Communication: No family in room Disposition Plan: TBD: ILF vs  ALF Consults called: None Admission status: Place in obs     ,  M. DO [facility]  How to contact the [facility] Attending or Consulting provider 7A - 7P or covering provider during after hours 7P -7A, for this patient?  1. Check the care team in [facility] and look for a) attending/consulting [facility] provider listed and b) the [facility] team listed 2. Log into [email or web address]  Amion Physician Scheduling and messaging for groups and whole hospitals  On call and physician scheduling software for group practices, residents, hospitalists and other medical providers for call, clinic, rotation and shift schedules. OnCall Enterprise is a hospital-wide system for scheduling doctors and paging doctors on call. EasyPlot is for scientific plotting and data analysis.  [email or web address]  and use [facility]'s universal password to access. If you do not have the password, please contact the hospital operator.  3. Locate the [facility] provider you are looking for under [facility] and page to a number that you can be directly reached. 4. If you still have difficulty reaching the provider, please page the [facility] (Director on Call) for the Hospitalists listed on amion for assistance.  11/21/2018, 4:27 AM

## 2018-11-21 NOTE — ED Notes (Signed)
Breakfast Tray Ordered. 

## 2018-11-22 DIAGNOSIS — R197 Diarrhea, unspecified: Secondary | ICD-10-CM | POA: Diagnosis not present

## 2018-11-22 DIAGNOSIS — A0811 Acute gastroenteropathy due to Norwalk agent: Secondary | ICD-10-CM | POA: Diagnosis not present

## 2018-11-22 LAB — BASIC METABOLIC PANEL
Anion gap: 8 (ref 5–15)
BUN: 14 mg/dL (ref 8–23)
CO2: 24 mmol/L (ref 22–32)
Calcium: 8 mg/dL — ABNORMAL LOW (ref 8.9–10.3)
Chloride: 106 mmol/L (ref 98–111)
Creatinine, Ser: 0.67 mg/dL (ref 0.61–1.24)
GFR calc Af Amer: >60 mL/min (ref >60)
GFR calc non Af Amer: >60 mL/min (ref >60)
Glucose, Bld: 85 mg/dL (ref 70–99)
Potassium: 3.8 mmol/L (ref 3.5–5.1)
Sodium: 138 mmol/L (ref 135–145)

## 2018-11-22 NOTE — Progress Notes (Signed)
Patient discharged home to [facility], independent living. Patient AVS reviewed and signed. Patient capable re-verbalizing medications and follow-up appointments. IV removed. Patient belongings sent with patient. Patient educated to return to the ED in the event of SOB, chest pain or dizziness.   Lillia Pauls RN

## 2018-11-22 NOTE — Discharge Summary (Signed)
Hector Beltran, is a 85 y.o. male  DOB [birth year]  MRN [id number].  Admission date:  11/20/2018  Admitting Physician  Hillary Bow, DO  Discharge Date:  11/22/2018   Primary MD  Renford Dills, MD  Recommendations for primary care physician for things to follow:   Discharge Diagnosis  Principal Problem:   Diarrhea of presumed infectious origin Active Problems:   Norovirus      Past Medical History:  Diagnosis Date  . Peripheral neuropathy 09/30/2017    Past Surgical History:  Procedure Laterality Date  . CYST REMOVAL NECK         HPI  from the history and physical done on the day of admission:    Hector Beltran is a 85 y.o. male with medical history significant of peripheral neuropathy.  Lives in ILF.  He returns to the ED with ongoing, severe, watery diarrhea and fecal incontinence.  Symptoms onset yesterday, 3/6, when he woke up.  Feels other people at facility likely had GI bugs.  No URI symptoms, no cough, no N/V, no fever, no abd pain, no CP.  Just large volume of watery diarrhea.  Seen in ED yesterday, treated and sent home.  Symptoms have continued throughout the day today.  EMS called, noted that there was feces all over his residence and he did not appear appropriate for independent living.   ED Course: Continued to have several episodes of watery diarrhea in ED per report.  Put on IVF, Tm of 99.  Lab work is otherwise mostly unremarkable.      Hospital Course:   1.  Diarrhea secondary to norovirus: Acute.  Patient presented from assisted living facility after having several episodes of watery diarrhea with fecal incontinence.  Was noted to have feces all over his residence and did not appear appropriate for independent living.  Stool cultures were noted to be positive for norovirus and patient  last noted to having diarrhea on 3/8.  Social work consulted and arrange for patient to go back to assisted living facility with home health services of physical therapy was noted to have some general weakness on physical exam.     Follow UP  Follow-up Information    Schedule an appointment as soon as possible for a visit  with Renford Dills, MD.   Specialty:  Internal Medicine Contact information: [address] [place] [address] [phone]            Consults obtained: None  Discharge Condition: Stable  Diet and Activity recommendation: See Discharge Instructions below   Discharge Instructions    Discharge instructions   Complete by:  As directed    Follow with Primary MD Renford Dills, MD within 1 week of discharge.  Here or found to have the norovirus as likely cause for your diarrhea symptoms.  Please remember to wash your hands regularly to decrease risk of spread.  Get CBC and BMP -  checked  by Primary MD or SNF MD  within 1  week we routinely change or add medications that can affect your baseline labs and fluid status, therefore we recommend that you get the mentioned basic workup next visit with your PCP, your PCP may decide not to get them or add new tests based on their clinical decision)  Activity: As tolerated utilizing rolling walker with fall precautions  Disposition: [facility] independent living facitlity  Referrals: Home health with physical therapy  Diet: Heart Healthy   Special Instructions: If you have smoked or chewed Tobacco  in the last 2 yrs please stop smoking, stop any regular Alcohol  and or any Recreational drug use.  On your next visit with your primary care physician please Get Medicines reviewed and adjusted.  Please request your Renford Dills, MD to go over all Hospital Tests and Procedure/Radiological results at the follow up, please get all Hospital records sent to your Prim MD by signing hospital release  before you go home.  If you experience worsening of your admission symptoms, develop shortness of breath, life threatening emergency, suicidal or homicidal thoughts you must seek medical attention immediately by calling 911 or calling your MD immediately  if symptoms less severe.  You Must read complete instructions/literature along with all the possible adverse reactions/side effects for all the Medicines you take and that have been prescribed to you. Take any new Medicines after you have completely understood and accpet all the possible adverse reactions/side effects.   Do not drive, operate heavy machinery, perform activities at heights, swimming or participation in water activities or provide baby sitting services if your were admitted for syncope or siezures until you have seen by Primary MD or a Neurologist and advised to do so again.  Do not drive when taking Pain medications.  Do not take more than prescribed Pain, Sleep and Anxiety Medications  Wear Seat belts while driving.   Please note  You were cared for by a hospitalist during your hospital stay. If you have any questions about your discharge medications or the care you received while you were in the hospital after you are discharged, you can call the unit and asked to speak with the hospitalist on call if the hospitalist that took care of you is not available. Once you are discharged, your primary care physician will handle any further medical issues. Please note that NO REFILLS for any discharge medications will be authorized once you are discharged, as it is imperative that you return to your primary care physician (or establish a relationship with a primary care physician if you do not have one) for your aftercare needs so that they can reassess your need for medications and monitor your lab values.   Increase activity slowly   Complete by:  As directed         Discharge Medications     Allergies as of 11/22/2018   No Known  Allergies     Medication List    STOP taking these medications   loperamide 2 MG capsule Commonly known as:  IMODIUM   sildenafil 50 MG tablet Commonly known as:  Viagra       Major procedures and Radiology Reports - PLEASE review detailed and final reports for all details, in brief -      Dg Chest 2 View  Result Date: 11/20/2018 CLINICAL DATA:  Tachypnea, diarrhea EXAM: CHEST - 2 VIEW COMPARISON:  10/07/2017 FINDINGS: There is hyperinflation of the lungs compatible with COPD. Cardiomegaly. No confluent airspace opacities or effusions. No acute bony abnormality. Stable  mild compression fracture in the midthoracic spine. IMPRESSION: Cardiomegaly.  COPD.  No active disease. Electronically Signed   By: Charlett Nose M.D.   On: 11/20/2018 20:17    Micro Results    Recent Results (from the past 240 hour(s))  C difficile quick scan w PCR reflex     Status: None   Collection Time: 11/21/18  8:42 AM  Result Value Ref Range Status   C Diff antigen NEGATIVE NEGATIVE Final   C Diff toxin NEGATIVE NEGATIVE Final   C Diff interpretation No C. difficile detected.  Final    Comment: Performed at [facility], [address] [place], [place] [address]  Gastrointestinal Panel by PCR , Stool     Status: Abnormal   Collection Time: 11/21/18  8:42 AM  Result Value Ref Range Status   Campylobacter species NOT DETECTED NOT DETECTED Final   Plesimonas shigelloides NOT DETECTED NOT DETECTED Final   Salmonella species NOT DETECTED NOT DETECTED Final   Yersinia enterocolitica NOT DETECTED NOT DETECTED Final   Vibrio species NOT DETECTED NOT DETECTED Final   Vibrio cholerae NOT DETECTED NOT DETECTED Final   Enteroaggregative E coli (EAEC) NOT DETECTED NOT DETECTED Final   Enteropathogenic E coli (EPEC) NOT DETECTED NOT DETECTED Final   Enterotoxigenic E coli (ETEC) NOT DETECTED NOT DETECTED Final   Shiga like toxin producing E coli (STEC) NOT DETECTED NOT DETECTED Final    Shigella/Enteroinvasive E coli (EIEC) NOT DETECTED NOT DETECTED Final   Cryptosporidium NOT DETECTED NOT DETECTED Final   Cyclospora cayetanensis NOT DETECTED NOT DETECTED Final   Entamoeba histolytica NOT DETECTED NOT DETECTED Final   Giardia lamblia NOT DETECTED NOT DETECTED Final   Adenovirus F40/41 NOT DETECTED NOT DETECTED Final   Astrovirus NOT DETECTED NOT DETECTED Final   Norovirus GI/GII DETECTED (A) NOT DETECTED Final    Comment: RESULT CALLED TO, READ BACK BY AND VERIFIED WITH: [facility] MORE AT 1551 11/21/2018.PMF    Rotavirus A NOT DETECTED NOT DETECTED Final   Sapovirus (I, II, IV, and V) NOT DETECTED NOT DETECTED Final    Comment: Performed at [facility], [address] [place], [place] [address]       Today   Subjective    Hector Beltran today reports that he is ready to go back to to the independent living facility and feels a lot better today.  Denies any chest pain, shortness of breath, or recurrence of diarrhea since yesterday morning.   Objective   Blood pressure 139/75, pulse 89, temperature 97.8 F (36.6 C), temperature source Oral, resp. rate 20, height  (1.905 m), weight 80.2 kg, SpO2 98 %.   Intake/Output Summary (Last 24 hours) at 11/22/2018 0934 Last data filed at 11/22/2018 0981 Gross per 24 hour  Intake 1100 ml  Output 800 ml  Net 300 ml    Exam  Constitutional: Elderly male in NAD, calm, comfortable Eyes: PERRL, lids and conjunctivae normal ENMT: Mucous membranes are moist. Posterior pharynx clear of any exudate or lesions.Poor dentition.  Neck: normal, supple, no masses, no thyromegaly Respiratory: clear to auscultation bilaterally, no wheezing, no crackles. Normal respiratory effort. No accessory muscle use.  Cardiovascular: Regular rate and rhythm, no murmurs / rubs / gallops. No extremity edema. 2+ pedal pulses. No carotid bruits.  Abdomen: no tenderness, no masses palpated. No hepatosplenomegaly. Bowel sounds positive.    Musculoskeletal: no clubbing / cyanosis. No joint deformity upper and lower extremities. Good ROM, no contractures. Normal muscle tone.  Skin: no rashes, lesions, ulcers. No induration Neurologic: CN 2-12 grossly intact. Sensation intact, DTR normal. Strength 5/5 in all 4.  Mild unsteadiness with ambulation. Psychiatric: Normal judgment and insight. Alert and oriented x 3. Normal mood.    Data Review   CBC w Diff:  Lab Results  Component Value Date   WBC 8.5 11/21/2018   HGB 14.1 11/21/2018   HCT 44.8 11/21/2018   PLT 149 (L) 11/21/2018   LYMPHOPCT 6 11/20/2018   MONOPCT 6 11/20/2018   EOSPCT 1 11/20/2018   BASOPCT 0 11/20/2018    CMP:  Lab Results  Component Value Date   NA 138 11/22/2018   K 3.8 11/22/2018   CL 106 11/22/2018   CO2 24 11/22/2018   BUN 14 11/22/2018   CREATININE 0.67 11/22/2018   PROT 6.0 (L) 11/20/2018   PROT 6.7 09/30/2017   ALBUMIN 3.1 (L) 11/20/2018   BILITOT 1.0 11/20/2018   ALKPHOS 50 11/20/2018   AST 42 (H) 11/20/2018   ALT 24 11/20/2018  .   Total Time in preparing paper work, data evaluation and todays exam - 35 minutes  Clydie Braun M.D on 11/22/2018 at 9:34 AM  [facility]   Office  [phone]

## 2018-11-22 NOTE — Evaluation (Signed)
Occupational Therapy Evaluation and Discharge Patient Details Name: Hector Beltran MRN: [id number] DOB: 01-09-1928 Today's Date: 11/22/2018    History of Present Illness 85 y.o. male with medical history significant of peripheral neuropathy.  Lives in ILF.  He returns to the ED with ongoing, severe, watery diarrhea and fecal incontinence found to have norovirus   Clinical Impression   This 85 yo male admitted with above presents to acute OT today at a Mod I level for mobility and basic ADLs. No further OT needs we will sing off.    Follow Up Recommendations  No OT follow up    Equipment Recommendations  None recommended by OT       Precautions / Restrictions Precautions Precautions: Fall Restrictions Weight Bearing Restrictions: No      Mobility Bed Mobility Overal bed mobility: Independent                Transfers Overall transfer level: Modified independent Equipment used: Rolling walker (2 wheeled)                  Balance Overall balance assessment: Mild deficits observed, not formally tested                                         ADL either performed or assessed with clinical judgement   ADL Overall ADL's : Modified independent                                       General ADL Comments: RW level     Vision Patient Visual Report: No change from baseline              Pertinent Vitals/Pain Pain Assessment: No/denies pain     Hand Dominance Right   Extremity/Trunk Assessment Upper Extremity Assessment Upper Extremity Assessment: Overall WFL for tasks assessed           Communication Communication Communication: No difficulties   Cognition Arousal/Alertness: Awake/alert Behavior During Therapy: WFL for tasks assessed/performed Overall Cognitive Status: Within Functional Limits for tasks assessed                                                Home Living Family/patient expects  to be discharged to:: Private residence Living Arrangements: Alone   Type of Home: Independent living facility([facility]) Home Access: Level entry     Home Layout: One level     Bathroom Shower/Tub: Producer, television/film/video: Handicapped height     Home Equipment: Environmental consultant - 4 wheels;Grab bars - tub/shower          Prior Functioning/Environment Level of Independence: Independent with assistive device(s)        Comments: uses rollator, drives to water aerobics 2 x a week        OT Problem List: Impaired balance (sitting and/or standing)(uses rollator)         OT Goals(Current goals can be found in the care plan section) Acute Rehab OT Goals Patient Stated Goal: to go home today  OT Frequency:                AM-PAC OT "6 Clicks" Daily Activity  Outcome Measure Help from another person eating meals?: None Help from another person taking care of personal grooming?: None Help from another person toileting, which includes using toliet, bedpan, or urinal?: None Help from another person bathing (including washing, rinsing, drying)?: None Help from another person to put on and taking off regular upper body clothing?: None Help from another person to put on and taking off regular lower body clothing?: None 6 Click Score: 24   End of Session Equipment Utilized During Treatment: Gait belt;Rolling walker  Activity Tolerance: Patient tolerated treatment well Patient left: in chair;with call bell/phone within reach;with chair alarm set  OT Visit Diagnosis: Unsteadiness on feet (R26.81)                Time: 7078-6754 OT Time Calculation (min): 20 min Charges:  OT General Charges $OT Visit: 1 Visit OT Evaluation $OT Eval Moderate Complexity: 1 Mod  Ignacia Palma, OTR/L Acute [employer] Pager [phone] Office [phone]     Evette Georges 11/22/2018, 11:28 AM

## 2018-11-22 NOTE — Care Management Note (Signed)
Case Management Note Hortencia Conradi, RN MSN  Transitions of Care [address] [place] [phone]  Patient Details  Name: Hector Beltran MRN: [id number] Date of Birth: 05-May-1928  Subjective/Objective:    Diarrhea                 Action/Plan: PTA patient lives alone at [facility]. Followed by [facility] for HHPT to work on balance issues. PT recommending continued HHPT. Call to [facility] to advise of patient transitioning home today. Spoke with someone at [facility] concerning transportation home. Will check with driver about trasportation, but stated that services are suspended at present d/t current norovirus outbreak. No other transition of care needs identified at this time.   Expected Discharge Date:  11/22/18               Expected Discharge Plan:  Home w Home Health Services  In-House Referral:  NA  Discharge planning Services  CM Consult  Post Acute Care Choice:  Resumption of Svcs/PTA Provider Choice offered to:  Patient  DME Arranged:  N/A DME Agency:  NA  HH Arranged:  PT HH Agency:  Other - See comment([facility])  Status of Service:  Completed, signed off  If discussed at Long Length of Stay Meetings, dates discussed:    Additional Comments:  Bess Kinds, RN 11/22/2018, 1:05 PM

## 2018-11-22 NOTE — Care Management Note (Signed)
Case Management Note Hortencia Conradi, RN MSN  Transitions of Care 48[place] [phone]  Patient Details  Name: CASHMERE CLENNEY MRN: [id number] Date of Birth: 1927/12/29  Subjective/Objective:    Diarrhea                 Action/Plan: PTA patient lives alone at [facility]. Followed by [facility] for HHPT to work on balance issues. PT recommending continued HHPT. Call to [facility] to advise of patient transitioning home today. Spoke with someone at [employer] concerning transportation home. Will check with driver about trasportation, but stated that services are suspended at present d/t current norovirus outbreak. No other transition of care needs identified at this time.   Expected Discharge Date:  11/22/18               Expected Discharge Plan:  Home w Home Health Services  In-House Referral:  NA  Discharge planning Services  CM Consult  Post Acute Care Choice:  Resumption of Svcs/PTA Provider Choice offered to:  Patient  DME Arranged:  N/A DME Agency:  NA  HH Arranged:  PT HH Agency:  Other - See comment([facility])  Status of Service:  Completed, signed off  If discussed at Long Length of Stay Meetings, dates discussed:    Additional Comments: [facility] unable to provide transport d/t all services suspended while quarantine in place. No family available. [facility] contacted for transport.   Bess Kinds, RN 11/22/2018, 2:42 PM

## 2019-03-04 ENCOUNTER — Ambulatory Visit: Payer: Medicare Other | Admitting: Neurology

## 2019-03-08 ENCOUNTER — Telehealth: Payer: Self-pay

## 2019-03-08 NOTE — Telephone Encounter (Signed)
Unable to get in contact with the patient to convert their office appt with Sarah on 03/09/2019 into a mychart video visit. I left a voicemail asking the patient to return my call. Office number was provided.    If patient calls back please convert their office visit into a mychart video visit.   

## 2019-03-09 ENCOUNTER — Ambulatory Visit: Payer: Medicare Other | Admitting: Neurology

## 2019-12-19 ENCOUNTER — Telehealth: Payer: Self-pay | Admitting: Neurology

## 2019-12-19 NOTE — Telephone Encounter (Signed)
I call pt and schedule him at next available with Dr. Anne Hahn. I added pt to waiting list. I stated Dr.Willis is part time and works every other week.He verbalized understanding.

## 2019-12-19 NOTE — Telephone Encounter (Signed)
Pt called wanting to r/s his missed appt but did not want to take the soonest appt that was offered to him in May. Pt wants to be seen sooner. Please advise.

## 2020-02-08 ENCOUNTER — Telehealth: Payer: Self-pay | Admitting: Neurology

## 2020-02-08 NOTE — Telephone Encounter (Signed)
Pt called to advise he is having car issues and is unsure if he will make it on time to his apt tomorrow but will do his best to come but wanted to let someone at the office know. Pt was advised he would have to be checked in by 8:35am. Pt advised "I will get there when I get there, I just wanted to let someone know." pt stated he needed to be seen and did not wish to reschedule. FYI

## 2020-02-09 ENCOUNTER — Other Ambulatory Visit: Payer: Self-pay

## 2020-02-09 ENCOUNTER — Encounter: Payer: Self-pay | Admitting: Neurology

## 2020-02-09 ENCOUNTER — Ambulatory Visit: Payer: Medicare PPO | Admitting: Neurology

## 2020-02-09 VITALS — BP 160/70 | HR 63 | Ht 75.0 in | Wt 190.0 lb

## 2020-02-09 DIAGNOSIS — G609 Hereditary and idiopathic neuropathy, unspecified: Secondary | ICD-10-CM

## 2020-02-09 DIAGNOSIS — R269 Unspecified abnormalities of gait and mobility: Secondary | ICD-10-CM

## 2020-02-09 HISTORY — DX: Unspecified abnormalities of gait and mobility: R26.9

## 2020-02-09 NOTE — Progress Notes (Signed)
    Reason for visit: Peripheral neuropathy, gait disturbance  Hector Beltran is an 86 y.o. male  History of present illness:  Hector Beltran is a 87 year old right-handed white male with a history of a gait disorder associated with a severe peripheral neuropathy. The patient has been walking with a walker, he has not had any falls, he exercises at least 3 times a week, he lives at [facility] [employer] [facility] [employer]. He does report some discomfort of the right heel when he is driving only, his heel does not hurt him when he walks. He is not having discomfort at night when he tries to sleep. He reports no neck or back pain. He denies any numbness in the hands or feet. Prior nerve conduction studies have shown no response in the legs. He returns to the office today, he is adamant about getting another nerve conduction study.  Past Medical History:  Diagnosis Date  . Peripheral neuropathy 09/30/2017    Past Surgical History:  Procedure Laterality Date  . CYST REMOVAL NECK      History reviewed. No pertinent family history.  Social history:  reports that he has been smoking cigarettes. He has never used smokeless tobacco. He reports current alcohol use of about 3.0 standard drinks of alcohol per week. He reports that he does not use drugs.   No Known Allergies  Medications:  Prior to Admission medications   Medication Sig Start Date End Date Taking? Authorizing Provider  OVER THE COUNTER MEDICATION Omega q plus daily   Yes [provider]  OVER THE COUNTER MEDICATION trexar take  daily   Yes [provider]    ROS:  Out of a complete 14 system review of symptoms, the patient complains only of the following symptoms, and all other reviewed systems are negative.  Walking difficulty Right heel pain  Blood pressure (!) 160/70, pulse 63, height 6\' 3"  (1.905 m), weight 190 lb (86.2 kg).  Physical Exam  General: The patient is alert and cooperative at the time of the examination.  Skin: No  significant peripheral edema is noted.   Neurologic Exam  Mental status: The patient is alert and oriented x 3 at the time of the examination. The patient has apparent normal recent and remote memory, with an apparently normal attention span and concentration ability.   Cranial nerves: Facial symmetry is present. Speech is normal, no aphasia or dysarthria is noted. Extraocular movements are full. Visual fields are full.  Motor: The patient has good strength in all 4 extremities, with exception of mild weakness of intrinsic muscles of the hands, trace foot drops bilaterally and some mild weakness with hip flexion bilaterally.  Sensory examination: Soft touch sensation is symmetric on the face, arms, and legs.  Coordination: The patient has good finger-nose-finger and heel-to-shin bilaterally.  Gait and station: The patient has a slightly wide-based gait, the patient walks well with a walker. Tandem gait was not attempted. Romberg is negative.  Reflexes: Deep tendon reflexes are symmetric, but are decreased to absent throughout.   Assessment/Plan:  1. Peripheral neuropathy, severe  2. Gait disorder  The patient is requesting another EMG and nerve conduction study, I will get this set up. The patient will follow-up for the evaluation. He is on no medications for his neuropathy, he is staying safe, he is exercising on a regular basis.  MD 02/09/2020 8:07 AM  [facility] [address] [place], [place]  Phone [phone] Fax [phone]

## 2020-02-16 DIAGNOSIS — L218 Other seborrheic dermatitis: Secondary | ICD-10-CM | POA: Diagnosis not present

## 2020-02-16 DIAGNOSIS — L817 Pigmented purpuric dermatosis: Secondary | ICD-10-CM | POA: Diagnosis not present

## 2020-02-16 DIAGNOSIS — L820 Inflamed seborrheic keratosis: Secondary | ICD-10-CM | POA: Diagnosis not present

## 2020-02-16 DIAGNOSIS — Z85828 Personal history of other malignant neoplasm of skin: Secondary | ICD-10-CM | POA: Diagnosis not present

## 2020-03-01 DIAGNOSIS — R200 Anesthesia of skin: Secondary | ICD-10-CM | POA: Diagnosis not present

## 2020-03-01 DIAGNOSIS — G629 Polyneuropathy, unspecified: Secondary | ICD-10-CM | POA: Diagnosis not present

## 2020-03-06 ENCOUNTER — Encounter: Payer: Self-pay | Admitting: Neurology

## 2020-03-06 ENCOUNTER — Ambulatory Visit: Payer: Medicare PPO | Admitting: Neurology

## 2020-03-06 ENCOUNTER — Other Ambulatory Visit: Payer: Self-pay

## 2020-03-06 ENCOUNTER — Ambulatory Visit (INDEPENDENT_AMBULATORY_CARE_PROVIDER_SITE_OTHER): Payer: Medicare PPO | Admitting: Neurology

## 2020-03-06 DIAGNOSIS — G609 Hereditary and idiopathic neuropathy, unspecified: Secondary | ICD-10-CM

## 2020-03-06 NOTE — Procedures (Signed)
     HISTORY:  Hector Beltran is a 86 year old gentleman with a history of a peripheral neuropathy and a gait disorder.  He comes in for reevaluation of his neuropathy.  He walks with a walker, he denies any recent falls.  NERVE CONDUCTION STUDIES:  Nerve conduction studies were performed on both upper extremities.  The distal motor latencies for the median nerves were prolonged bilaterally with normal motor amplitudes seen for these nerves bilaterally.  The distal motor latency for the right ulnar nerve was prolonged with a low motor amplitude.  The nerve conduction velocities for the median nerves bilaterally and for the right ulnar nerve were normal.  The sensory latency for the right radial nerve was normal, and was prolonged for the median nerves bilaterally and for the right ulnar nerve.  The F-wave latency for the right ulnar nerve was prolonged.  Nerve conduction studies were performed on the right lower extremity.  The distal motor latencies for the peroneal and posterior tibial nerves were normal with low motor amplitudes for these nerves and slowing seen for these nerves.  No response was seen for the right sural or peroneal sensory latencies.  The left F-wave latency for the right posterior tibial nerve was prolonged.   EMG STUDIES:  EMG study was performed on the right lower extremity:  The tibialis anterior muscle reveals 2 to 4K motor units with decreased recruitment. 2+ fibrillations and positive waves were seen. The peroneus tertius muscle reveals 2 to 3K motor units with significantly decreased recruitment. 1+ fibrillations and positive waves were seen. The medial gastrocnemius muscle reveals 1 to 3K motor units with slightly decreased recruitment. 1+ fibrillations and positive waves were seen. The vastus lateralis muscle reveals 2 to 4K motor units with full recruitment. No fibrillations or positive waves were seen. The iliopsoas muscle reveals 2 to 4K motor units with full  recruitment. No fibrillations or positive waves were seen. The biceps femoris muscle (long head) reveals 2 to 4K motor units with full recruitment. No fibrillations or positive waves were seen. The lumbosacral paraspinal muscles were tested at 3 levels, and revealed no abnormalities of insertional activity at the upper level tested.  1+ positive waves were seen at the middle and lower levels.  There was good relaxation.   IMPRESSION:  Nerve conduction studies done on both upper extremities and on the right lower extremity show evidence of a primarily axonal peripheral neuropathy of moderate severity.  In comparison to a study done in February 2019, there has been some improvement of neuronal function in the right leg.  EMG evaluation of the right lower extremity shows distal acute and chronic denervation consistent with a diagnosis of peripheral neuropathy.  Mild denervation seen in the lumbosacral paraspinal muscles suggesting a mild overlying lumbosacral radiculopathy of indeterminate level.  Marlan Palau MD 03/06/2020 2:25 PM  [facility] [address] [place], [place] [address]  Phone [phone] Fax [phone]

## 2020-03-06 NOTE — Progress Notes (Signed)
Please refer to EMG and nerve conduction procedure note.  

## 2020-03-06 NOTE — Progress Notes (Addendum)
The patient comes in for EMG nerve conduction study evaluation.  In comparison to a prior study done in February 2019, there has been some improvement in nerve function in the right leg.  EMG of the right leg does show some distal acute and chronic denervation consistent with neuropathy but there is some mild acute denervation in the lumbosacral paraspinal muscles, there may be an overlying lumbosacral radiculopathy of indeterminate level.      MNC    Nerve / Sites Muscle Latency Ref. Amplitude Ref. Rel Amp Segments Distance Velocity Ref. Area    ms ms mV mV %  cm m/s m/s mVms  R Median - APB     Wrist APB 5.0 ?4.4 4.8 ?4.0 100 Wrist - APB 7   28.0     Upper arm APB 10.4  4.2  86.9 Upper arm - Wrist 27 49 ?49 27.0  L Median - APB     Wrist APB 4.9 ?4.4 8.0 ?4.0 100 Wrist - APB 7   40.1     Upper arm APB 9.6  7.6  96 Upper arm - Wrist 25 53 ?49 39.8  R Ulnar - ADM     Wrist ADM 3.7 ?3.3 2.3 ?6.0 100 Wrist - ADM 7   4.9     B.Elbow ADM 8.2  10.0  443 B.Elbow - Wrist 25 56 ?49 37.7     A.Elbow ADM 10.1  9.3  93 A.Elbow - B.Elbow 10 52 ?49 37.4  R Peroneal - EDB     Ankle EDB 6.1 ?6.5 0.5 ?2.0 100 Ankle - EDB 9   2.0     Fib head EDB 15.9  0.5  113 Fib head - Ankle 34 35 ?44 2.5     Pop fossa EDB 18.8  0.5  96.8 Pop fossa - Fib head 10 35 ?44 2.6         Pop fossa - Ankle      R Tibial - AH     Ankle AH 5.2 ?5.8 0.3 ?4.0 100 Ankle - AH 9   1.2     Pop fossa AH 19.6  0.2  70.2 Pop fossa - Ankle 47 33 ?41 0.9                SNC    Nerve / Sites Rec. Site Peak Lat Ref.  Amp Ref. Segments Distance    ms ms V V  cm  R Radial - Anatomical snuff box (Forearm)     Forearm Wrist 2.8 ?2.9 17 ?15 Forearm - Wrist 10  R Sural - Ankle (Calf)     Calf Ankle NR ?4.4 NR ?6 Calf - Ankle 14  R Superficial peroneal - Ankle     Lat leg Ankle NR ?4.4 NR ?6 Lat leg - Ankle 14  R Median - Orthodromic (Dig II, Mid palm)     Dig II Wrist 3.6 ?3.4 10 ?10 Dig II - Wrist 14  L Median - Orthodromic (Dig  II, Mid palm)     Dig II Wrist 4.0 ?3.4 10 ?10 Dig II - Wrist 13  R Ulnar - Orthodromic, (Dig V, Mid palm)     Dig V Wrist 4.1 ?3.1 3 ?5 Dig V - Wrist 51                 F  Wave    Nerve F Lat Ref.   ms ms  R Tibial - AH 69.6 ?56.0  R Ulnar - ADM  37.3 ?32.0        

## 2020-05-29 ENCOUNTER — Other Ambulatory Visit: Payer: Self-pay

## 2020-05-29 ENCOUNTER — Ambulatory Visit: Payer: Medicare PPO | Admitting: Neurology

## 2020-05-29 ENCOUNTER — Encounter: Payer: Self-pay | Admitting: Neurology

## 2020-05-29 VITALS — BP 157/89 | HR 94 | Ht 75.0 in | Wt 194.0 lb

## 2020-05-29 DIAGNOSIS — G609 Hereditary and idiopathic neuropathy, unspecified: Secondary | ICD-10-CM | POA: Diagnosis not present

## 2020-05-29 DIAGNOSIS — R269 Unspecified abnormalities of gait and mobility: Secondary | ICD-10-CM | POA: Diagnosis not present

## 2020-05-29 NOTE — Progress Notes (Signed)
Reason for visit: Peripheral neuropathy, gait disturbance  Referring physician: Dr. Elliot Dally is a 86 y.o. male  History of present illness:  Hector Beltran is a 86 year old right-handed white male with a history of a gait disturbance associated with a peripheral neuropathy.  He has been seen and evaluated recently with EMG and nerve conduction study evaluation that if anything showed mild improvement in nerve function in the legs from the prior study.  The patient walks with a walker, he has not had any falls.  He denies any discomfort in the hands or feet and does not require medication for this.  The patient denies any numbness in the hands, he does not drop things.  He denies any significant change in strength.  He denies issues controlling the bowels or the bladder.  He returns for further evaluation.  Past Medical History:  Diagnosis Date  . Gait abnormality 02/09/2020  . Peripheral neuropathy 09/30/2017    Past Surgical History:  Procedure Laterality Date  . CYST REMOVAL NECK      History reviewed. No pertinent family history.  Social history:  reports that he has been smoking cigarettes. He has never used smokeless tobacco. He reports current alcohol use of about 3.0 standard drinks of alcohol per week. He reports that he does not use drugs.  Medications:  Prior to Admission medications   Medication Sig Start Date End Date Taking? Authorizing Provider  OVER THE COUNTER MEDICATION Omega q plus daily   Yes [provider]  OVER THE COUNTER MEDICATION trexar take  daily   Yes [provider]     No Known Allergies  ROS:  Out of a complete 14 system review of symptoms, the patient complains only of the following symptoms, and all other reviewed systems are negative.  Walking problems Numbness in the feet  Blood pressure (!) 157/89, pulse 94, height 6\' 3"  (1.905 m), weight 194 lb (88 kg).  Physical Exam  General: The patient is alert and  cooperative at the time of the examination.  Eyes: Pupils are equal, round, and reactive to light. Discs are flat bilaterally.  Neck: The neck is supple, no carotid bruits are noted.  Respiratory: The respiratory examination is clear.  Cardiovascular: The cardiovascular examination reveals a regular rate and rhythm, no obvious murmurs or rubs are noted.  Skin: Extremities are with 1+ edema at the ankles.  Neurologic Exam  Mental status: The patient is alert and oriented x 3 at the time of the examination. The patient has apparent normal recent and remote memory, with an apparently normal attention span and concentration ability.  Cranial nerves: Facial symmetry is present. There is good sensation of the face to pinprick and soft touch bilaterally. The strength of the facial muscles and the muscles to head turning and shoulder shrug are normal bilaterally. Speech is well enunciated, no aphasia or dysarthria is noted. Extraocular movements are full. Visual fields are full. The tongue is midline, and the patient has symmetric elevation of the soft palate. No obvious hearing deficits are noted.  Motor: The motor testing reveals 5 over 5 strength of all 4 extremities. Good symmetric motor tone is noted throughout.  Sensory: Sensory testing is intact to pinprick, soft touch, vibration sensation, and position sense on all 4 extremities, with exception of decreased position sense in both feet.  There is a stocking pattern pinprick sensory deficit in the distal third of the legs bilaterally. No evidence of extinction is noted.  Coordination: Cerebellar testing reveals good finger-nose-finger and heel-to-shin bilaterally.  Gait and station: Gait is shuffling in nature, unsteady.  The patient normally uses a walker for ambulation.  Tandem gait was not attempted.  Romberg is negative but is unsteady.  Reflexes: Deep tendon reflexes are symmetric, but are depressed bilaterally. Toes are downgoing  bilaterally.   Assessment/Plan:  1.  Peripheral neuropathy  2.  Gait disturbance  The patient appears to be relatively stable with his ability to ambulate.  He has gait instability but he compensates with using a walker and remains safe doing this.  We will follow the patient over time, he will follow-up in 1 year.  He remains active, he does water aerobics 3 times a week and walks on a regular basis.  He lives at [employer].  Marlan Palau MD 05/29/2020 9:17 AM  [facility] [address] [place], [place] [address]  Phone [phone] Fax [phone]

## 2020-06-08 DIAGNOSIS — Z23 Encounter for immunization: Secondary | ICD-10-CM | POA: Diagnosis not present

## 2020-06-08 DIAGNOSIS — J449 Chronic obstructive pulmonary disease, unspecified: Secondary | ICD-10-CM | POA: Diagnosis not present

## 2020-06-08 DIAGNOSIS — Z1389 Encounter for screening for other disorder: Secondary | ICD-10-CM | POA: Diagnosis not present

## 2020-06-08 DIAGNOSIS — G629 Polyneuropathy, unspecified: Secondary | ICD-10-CM | POA: Diagnosis not present

## 2020-06-08 DIAGNOSIS — F17210 Nicotine dependence, cigarettes, uncomplicated: Secondary | ICD-10-CM | POA: Diagnosis not present

## 2020-06-08 DIAGNOSIS — I700 Atherosclerosis of aorta: Secondary | ICD-10-CM | POA: Diagnosis not present

## 2020-06-08 DIAGNOSIS — Z0000 Encounter for general adult medical examination without abnormal findings: Secondary | ICD-10-CM | POA: Diagnosis not present

## 2020-06-18 DIAGNOSIS — H2513 Age-related nuclear cataract, bilateral: Secondary | ICD-10-CM | POA: Diagnosis not present

## 2020-06-18 DIAGNOSIS — H524 Presbyopia: Secondary | ICD-10-CM | POA: Diagnosis not present

## 2020-08-21 ENCOUNTER — Other Ambulatory Visit: Payer: Self-pay | Admitting: Internal Medicine

## 2020-08-21 ENCOUNTER — Ambulatory Visit
Admission: RE | Admit: 2020-08-21 | Discharge: 2020-08-21 | Disposition: A | Discharge status: Home / Self Care | Payer: Medicare Other | Source: Ambulatory Visit | Attending: Internal Medicine | Admitting: Internal Medicine

## 2020-08-21 DIAGNOSIS — I509 Heart failure, unspecified: Secondary | ICD-10-CM | POA: Diagnosis not present

## 2020-08-21 DIAGNOSIS — R0989 Other specified symptoms and signs involving the circulatory and respiratory systems: Secondary | ICD-10-CM

## 2020-08-21 DIAGNOSIS — J90 Pleural effusion, not elsewhere classified: Secondary | ICD-10-CM | POA: Diagnosis not present

## 2020-08-21 DIAGNOSIS — Z66 Do not resuscitate: Secondary | ICD-10-CM | POA: Diagnosis not present

## 2020-08-21 DIAGNOSIS — R0902 Hypoxemia: Secondary | ICD-10-CM

## 2020-08-21 DIAGNOSIS — Z720 Tobacco use: Secondary | ICD-10-CM | POA: Diagnosis not present

## 2020-08-22 ENCOUNTER — Inpatient Hospital Stay (HOSPITAL_COMMUNITY)
Admission: EM | Admit: 2020-08-22 | Discharge: 2020-08-31 | DRG: 291 | Disposition: A | Discharge status: Skilled Nursing Facility | Payer: Medicare PPO | Attending: Internal Medicine | Admitting: Internal Medicine

## 2020-08-22 ENCOUNTER — Other Ambulatory Visit: Payer: Self-pay

## 2020-08-22 ENCOUNTER — Emergency Department (HOSPITAL_COMMUNITY): Payer: Medicare PPO

## 2020-08-22 ENCOUNTER — Encounter (HOSPITAL_COMMUNITY): Payer: Self-pay | Admitting: Internal Medicine

## 2020-08-22 DIAGNOSIS — I509 Heart failure, unspecified: Secondary | ICD-10-CM

## 2020-08-22 DIAGNOSIS — J441 Chronic obstructive pulmonary disease with (acute) exacerbation: Secondary | ICD-10-CM | POA: Diagnosis present

## 2020-08-22 DIAGNOSIS — I5033 Acute on chronic diastolic (congestive) heart failure: Secondary | ICD-10-CM | POA: Diagnosis present

## 2020-08-22 DIAGNOSIS — J9601 Acute respiratory failure with hypoxia: Secondary | ICD-10-CM | POA: Diagnosis present

## 2020-08-22 DIAGNOSIS — G629 Polyneuropathy, unspecified: Secondary | ICD-10-CM | POA: Diagnosis present

## 2020-08-22 DIAGNOSIS — R062 Wheezing: Secondary | ICD-10-CM | POA: Diagnosis not present

## 2020-08-22 DIAGNOSIS — K7689 Other specified diseases of liver: Secondary | ICD-10-CM | POA: Diagnosis not present

## 2020-08-22 DIAGNOSIS — R778 Other specified abnormalities of plasma proteins: Secondary | ICD-10-CM | POA: Diagnosis not present

## 2020-08-22 DIAGNOSIS — R269 Unspecified abnormalities of gait and mobility: Secondary | ICD-10-CM | POA: Diagnosis present

## 2020-08-22 DIAGNOSIS — K7460 Unspecified cirrhosis of liver: Secondary | ICD-10-CM | POA: Diagnosis present

## 2020-08-22 DIAGNOSIS — J40 Bronchitis, not specified as acute or chronic: Secondary | ICD-10-CM

## 2020-08-22 DIAGNOSIS — R278 Other lack of coordination: Secondary | ICD-10-CM | POA: Diagnosis not present

## 2020-08-22 DIAGNOSIS — Z66 Do not resuscitate: Secondary | ICD-10-CM | POA: Diagnosis present

## 2020-08-22 DIAGNOSIS — J90 Pleural effusion, not elsewhere classified: Secondary | ICD-10-CM

## 2020-08-22 DIAGNOSIS — Z20822 Contact with and (suspected) exposure to covid-19: Secondary | ICD-10-CM | POA: Diagnosis present

## 2020-08-22 DIAGNOSIS — D696 Thrombocytopenia, unspecified: Secondary | ICD-10-CM | POA: Diagnosis present

## 2020-08-22 DIAGNOSIS — E8809 Other disorders of plasma-protein metabolism, not elsewhere classified: Secondary | ICD-10-CM | POA: Diagnosis present

## 2020-08-22 DIAGNOSIS — F17210 Nicotine dependence, cigarettes, uncomplicated: Secondary | ICD-10-CM | POA: Diagnosis present

## 2020-08-22 DIAGNOSIS — Z7401 Bed confinement status: Secondary | ICD-10-CM | POA: Diagnosis not present

## 2020-08-22 DIAGNOSIS — M2550 Pain in unspecified joint: Secondary | ICD-10-CM | POA: Diagnosis not present

## 2020-08-22 DIAGNOSIS — E875 Hyperkalemia: Secondary | ICD-10-CM | POA: Diagnosis present

## 2020-08-22 DIAGNOSIS — M6281 Muscle weakness (generalized): Secondary | ICD-10-CM | POA: Diagnosis not present

## 2020-08-22 DIAGNOSIS — R7989 Other specified abnormal findings of blood chemistry: Secondary | ICD-10-CM | POA: Diagnosis present

## 2020-08-22 DIAGNOSIS — I5021 Acute systolic (congestive) heart failure: Secondary | ICD-10-CM | POA: Diagnosis not present

## 2020-08-22 DIAGNOSIS — J449 Chronic obstructive pulmonary disease, unspecified: Secondary | ICD-10-CM | POA: Diagnosis present

## 2020-08-22 DIAGNOSIS — R609 Edema, unspecified: Secondary | ICD-10-CM | POA: Diagnosis not present

## 2020-08-22 DIAGNOSIS — Q446 Cystic disease of liver: Secondary | ICD-10-CM | POA: Diagnosis not present

## 2020-08-22 DIAGNOSIS — B356 Tinea cruris: Secondary | ICD-10-CM | POA: Diagnosis not present

## 2020-08-22 DIAGNOSIS — Z743 Need for continuous supervision: Secondary | ICD-10-CM | POA: Diagnosis not present

## 2020-08-22 DIAGNOSIS — R0902 Hypoxemia: Secondary | ICD-10-CM

## 2020-08-22 DIAGNOSIS — R0602 Shortness of breath: Secondary | ICD-10-CM | POA: Diagnosis not present

## 2020-08-22 DIAGNOSIS — R2681 Unsteadiness on feet: Secondary | ICD-10-CM | POA: Diagnosis not present

## 2020-08-22 DIAGNOSIS — R2243 Localized swelling, mass and lump, lower limb, bilateral: Secondary | ICD-10-CM | POA: Diagnosis not present

## 2020-08-22 LAB — CBC WITH DIFFERENTIAL/PLATELET
Abs Immature Granulocytes: 0.02 10*3/uL (ref 0.00–0.07)
Basophils Absolute: 0 10*3/uL (ref 0.0–0.1)
Basophils Relative: 1 %
Eosinophils Absolute: 0.1 10*3/uL (ref 0.0–0.5)
Eosinophils Relative: 3 %
HCT: 49.1 % (ref 39.0–52.0)
Hemoglobin: 15.3 g/dL (ref 13.0–17.0)
Immature Granulocytes: 0 %
Lymphocytes Relative: 15 %
Lymphs Abs: 0.8 10*3/uL (ref 0.7–4.0)
MCH: 32.1 pg (ref 26.0–34.0)
MCHC: 31.2 g/dL (ref 30.0–36.0)
MCV: 102.9 fL — ABNORMAL HIGH (ref 80.0–100.0)
Monocytes Absolute: 0.6 10*3/uL (ref 0.1–1.0)
Monocytes Relative: 11 %
Neutro Abs: 3.8 10*3/uL (ref 1.7–7.7)
Neutrophils Relative %: 70 %
Platelets: 138 10*3/uL — ABNORMAL LOW (ref 150–400)
RBC: 4.77 MIL/uL (ref 4.22–5.81)
RDW: 14.3 % (ref 11.5–15.5)
WBC: 5.3 10*3/uL (ref 4.0–10.5)
nRBC: 0 % (ref 0.0–0.2)

## 2020-08-22 LAB — COMPREHENSIVE METABOLIC PANEL
ALT: 19 U/L (ref 0–44)
AST: 26 U/L (ref 15–41)
Albumin: 3.3 g/dL — ABNORMAL LOW (ref 3.5–5.0)
Alkaline Phosphatase: 62 U/L (ref 38–126)
Anion gap: 10 (ref 5–15)
BUN: 28 mg/dL — ABNORMAL HIGH (ref 8–23)
CO2: 30 mmol/L (ref 22–32)
Calcium: 8.5 mg/dL — ABNORMAL LOW (ref 8.9–10.3)
Chloride: 101 mmol/L (ref 98–111)
Creatinine, Ser: 1.08 mg/dL (ref 0.61–1.24)
GFR, Estimated: >60 mL/min (ref >60)
Glucose, Bld: 98 mg/dL (ref 70–99)
Potassium: 5.3 mmol/L — ABNORMAL HIGH (ref 3.5–5.1)
Sodium: 141 mmol/L (ref 135–145)
Total Bilirubin: 1.1 mg/dL (ref 0.3–1.2)
Total Protein: 6.6 g/dL (ref 6.5–8.1)

## 2020-08-22 LAB — I-STAT CHEM 8, ED
BUN: 33 mg/dL — ABNORMAL HIGH (ref 8–23)
Calcium, Ion: 1.04 mmol/L — ABNORMAL LOW (ref 1.15–1.40)
Chloride: 101 mmol/L (ref 98–111)
Creatinine, Ser: 1 mg/dL (ref 0.61–1.24)
Glucose, Bld: 95 mg/dL (ref 70–99)
HCT: 49 % (ref 39.0–52.0)
Hemoglobin: 16.7 g/dL (ref 13.0–17.0)
Potassium: 5.1 mmol/L (ref 3.5–5.1)
Sodium: 140 mmol/L (ref 135–145)
TCO2: 35 mmol/L — ABNORMAL HIGH (ref 22–32)

## 2020-08-22 LAB — FOLATE: Folate: 36.7 ng/mL (ref >5.9)

## 2020-08-22 LAB — RESP PANEL BY RT-PCR (FLU A&B, COVID) ARPGX2
Influenza A by PCR: NEGATIVE
Influenza B by PCR: NEGATIVE
SARS Coronavirus 2 by RT PCR: NEGATIVE

## 2020-08-22 LAB — TROPONIN I (HIGH SENSITIVITY)
Troponin I (High Sensitivity): 20 ng/L — ABNORMAL HIGH (ref <18)
Troponin I (High Sensitivity): 20 ng/L — ABNORMAL HIGH (ref <18)

## 2020-08-22 LAB — BRAIN NATRIURETIC PEPTIDE: B Natriuretic Peptide: 281 pg/mL — ABNORMAL HIGH (ref 0.0–100.0)

## 2020-08-22 LAB — HIV ANTIBODY (ROUTINE TESTING W REFLEX): HIV Screen 4th Generation wRfx: NONREACTIVE

## 2020-08-22 LAB — VITAMIN B12: Vitamin B-12: 707 pg/mL (ref 180–914)

## 2020-08-22 MED ORDER — ENOXAPARIN SODIUM 40 MG/0.4ML ~~LOC~~ SOLN
40.0000 mg | SUBCUTANEOUS | Status: DC
  Administered 2020-08-23 – 2020-08-30 (×8): 40 mg via SUBCUTANEOUS
  Filled 2020-08-22 (×9): qty 0.4

## 2020-08-22 MED ORDER — ACETAMINOPHEN 325 MG PO TABS: 650.0000 mg | ORAL_TABLET | ORAL | Status: DC | PRN

## 2020-08-22 MED ORDER — AEROCHAMBER PLUS FLO-VU LARGE MISC
Status: AC
  Filled 2020-08-22: qty 1

## 2020-08-22 MED ORDER — FUROSEMIDE 10 MG/ML IJ SOLN
40.0000 mg | Freq: Once | INTRAMUSCULAR | Status: AC
  Administered 2020-08-22: 40 mg via INTRAVENOUS
  Filled 2020-08-22: qty 4

## 2020-08-22 MED ORDER — SODIUM CHLORIDE 0.9% FLUSH
3.0000 mL | Freq: Two times a day (BID) | INTRAVENOUS | Status: DC
  Administered 2020-08-23 – 2020-08-31 (×16): 3 mL via INTRAVENOUS

## 2020-08-22 MED ORDER — ALBUTEROL SULFATE (2.5 MG/3ML) 0.083% IN NEBU: 2.5000 mg | INHALATION_SOLUTION | RESPIRATORY_TRACT | Status: DC | PRN

## 2020-08-22 MED ORDER — SODIUM CHLORIDE 0.9 % IV SOLN: 250.0000 mL | INTRAVENOUS | Status: DC | PRN

## 2020-08-22 MED ORDER — POLYETHYLENE GLYCOL 3350 17 G PO PACK
17.0000 g | PACK | Freq: Every day | ORAL | Status: DC | PRN
  Administered 2020-08-24 – 2020-08-27 (×2): 17 g via ORAL
  Filled 2020-08-22: qty 1

## 2020-08-22 MED ORDER — ALBUTEROL SULFATE HFA 108 (90 BASE) MCG/ACT IN AERS
2.0000 | INHALATION_SPRAY | Freq: Once | RESPIRATORY_TRACT | Status: AC
  Administered 2020-08-22: 2 via RESPIRATORY_TRACT
  Filled 2020-08-22: qty 6.7

## 2020-08-22 MED ORDER — METHYLPREDNISOLONE SODIUM SUCC 125 MG IJ SOLR
125.0000 mg | Freq: Once | INTRAMUSCULAR | Status: AC
  Administered 2020-08-22: 125 mg via INTRAVENOUS
  Filled 2020-08-22: qty 2

## 2020-08-22 MED ORDER — SODIUM CHLORIDE 0.9% FLUSH: 3.0000 mL | INTRAVENOUS | Status: DC | PRN

## 2020-08-22 MED ORDER — IOHEXOL 350 MG/ML SOLN
60.0000 mL | Freq: Once | INTRAVENOUS | Status: AC | PRN
  Administered 2020-08-22: 60 mL via INTRAVENOUS

## 2020-08-22 MED ORDER — PREDNISONE 20 MG PO TABS
40.0000 mg | ORAL_TABLET | Freq: Every day | ORAL | Status: AC
  Administered 2020-08-23 – 2020-08-27 (×5): 40 mg via ORAL
  Filled 2020-08-22 (×5): qty 2

## 2020-08-22 MED ORDER — IPRATROPIUM-ALBUTEROL 0.5-2.5 (3) MG/3ML IN SOLN
3.0000 mL | Freq: Four times a day (QID) | RESPIRATORY_TRACT | Status: DC
  Administered 2020-08-23 (×3): 3 mL via RESPIRATORY_TRACT
  Filled 2020-08-22 (×3): qty 3

## 2020-08-22 MED ORDER — ONDANSETRON HCL 4 MG/2ML IJ SOLN: 4.0000 mg | Freq: Four times a day (QID) | INTRAMUSCULAR | Status: DC | PRN

## 2020-08-22 MED ORDER — FUROSEMIDE 10 MG/ML IJ SOLN
20.0000 mg | Freq: Two times a day (BID) | INTRAMUSCULAR | Status: DC
  Administered 2020-08-23 (×2): 20 mg via INTRAVENOUS
  Filled 2020-08-22 (×3): qty 2

## 2020-08-22 NOTE — ED Triage Notes (Signed)
PT to ED today for SOB.Initial EMS call was for a fall.  Pt reports slip and fall off toilet today. Denies LOC, denies hitting head, denies blood thinner use. No complaints from fall.  Pt here for SOB. EMS reports pt O2 in low 80s. Pt transported to ED with NRB.  Pt seen by PCP yest for SOB. Pt up to date with COVID vaccines.   Pt reports feeling better now. O2 89-90 on RA. Pt placed on 2L [place]

## 2020-08-22 NOTE — ED Notes (Signed)
Pt educated multiple times to not get out of bed-- utilize urinal, stay connected to monitor, and continue to wear O2. Pt repeatedly gets out of bed to use commode, takes off monitor and O2, and desats into the 70s.

## 2020-08-22 NOTE — ED Provider Notes (Signed)
[facility] EMERGENCY DEPARTMENT Provider Note   CSN: [id number] Arrival date & time: 08/22/20  1600     History Chief Complaint  Patient presents with  . Shortness of Breath    Hector Beltran is a 86 y.o. male hx of peripheral neuropathy, here with shortness of breath.  Patient has worsening shortness of breath since yesterday.  Patient went to his primary care doctor yesterday and he has a chest x-ray that showed atelectasis.  Patient also has a swollen left foot that he states is from neuropathy.  There is some drainage from the left foot as well.  Patient is from a assisted living and today was on the commode and was unable to get off.  He denies falling off the commode or hitting his head.  When EMS got there, his oxygen saturation was in the low 80s.  Patient was put on a nonrebreather.  Patient had all his Covid vaccines including the booster.  Patient is not on oxygen at baseline.  Patient denies any history of COPD.  The history is provided by the patient.       Past Medical History:  Diagnosis Date  . Gait abnormality 02/09/2020  . Peripheral neuropathy 09/30/2017    Patient Active Problem List   Diagnosis Date Noted  . Gait abnormality 02/09/2020  . Norovirus 11/22/2018  . Diarrhea of presumed infectious origin 11/21/2018  . Peripheral neuropathy 09/30/2017    Past Surgical History:  Procedure Laterality Date  . CYST REMOVAL NECK         No family history on file.  Social History   Tobacco Use  . Smoking status: Current Every Day Smoker    Types: Cigarettes  . Smokeless tobacco: Never Used  . Tobacco comment: 2 packs per week  Vaping Use  . Vaping Use: Never used  Substance Use Topics  . Alcohol use: Yes    Alcohol/week: 3.0 standard drinks    Types: 1 Glasses of wine, 1 Cans of beer, 1 Shots of liquor per week    Comment: occasionally  . Drug use: No    Home Medications Prior to Admission medications   Medication Sig Start Date  End Date Taking? Authorizing Provider  OVER THE COUNTER MEDICATION Omega q plus daily    [provider]  OVER THE COUNTER MEDICATION trexar take  daily    [provider]    Allergies    Patient has no known allergies.  Review of Systems   Review of Systems  Respiratory: Positive for shortness of breath.   All other systems reviewed and are negative.   Physical Exam Updated Vital Signs BP (!) 141/87   Temp 98.2 F (36.8 C) (Oral)   Resp (!) 30   Ht 6\' 4"  (1.93 m)   Wt 102.1 kg   SpO2 95%   BMI 27.39 kg/m   Physical Exam Vitals and nursing note reviewed.  Constitutional:      Comments: Tachypneic, chronically ill   HENT:     Head: Normocephalic.     Mouth/Throat:     Mouth: Mucous membranes are moist.  Eyes:     Extraocular Movements: Extraocular movements intact.     Pupils: Pupils are equal, round, and reactive to light.  Cardiovascular:     Rate and Rhythm: Normal rate and regular rhythm.  Pulmonary:     Comments: Wheezing bilaterally, bibasilar crackles as well  Abdominal:     General: Bowel sounds are normal.  Palpations: Abdomen is soft.  Musculoskeletal:     Cervical back: Normal range of motion.     Comments: L foot with drainage multiple toes, no obvious osteomyelitis. There is some surrounding redness and swelling bilateral legs   Skin:    General: Skin is warm.     Capillary Refill: Capillary refill takes less than 2 seconds.  Neurological:     General: No focal deficit present.     Mental Status: He is oriented to person, place, and time.  Psychiatric:        Mood and Affect: Mood normal.        Behavior: Behavior normal.     ED Results / Procedures / Treatments   Labs (all labs ordered are listed, but only abnormal results are displayed) Labs Reviewed  RESP PANEL BY RT-PCR (FLU A&B, COVID) ARPGX2  CBC WITH DIFFERENTIAL/PLATELET  COMPREHENSIVE METABOLIC PANEL  BRAIN NATRIURETIC PEPTIDE  BLOOD GAS, VENOUS  TROPONIN I  (HIGH SENSITIVITY)    EKG EKG Interpretation  Date/Time:  Wednesday August 22 2020 16:02:24 EST Ventricular Rate:  93 PR Interval:    QRS Duration: 90 QT Interval:  368 QTC Calculation: 458 R Axis:   12 Text Interpretation: Sinus rhythm Left atrial enlargement No previous ECGs available Confirmed by Richardean Canal [phone]) on 08/22/2020 4:06:42 PM   Radiology DG Chest 2 View  Result Date: 08/21/2020 CLINICAL DATA:  Hypoxia. EXAM: CHEST - 2 VIEW COMPARISON:  November 20, 2018. FINDINGS: Stable cardiomegaly. No pneumothorax is noted. Left lung is clear. Mild right basilar atelectasis or infiltrate is noted with small right pleural effusion. Bony thorax is unremarkable. IMPRESSION: Mild right basilar atelectasis or infiltrate is noted with small right pleural effusion. Electronically Signed   By: Lupita Raider M.D.   On: 08/21/2020 13:01   DG Chest Right Decubitus  Result Date: 08/21/2020 CLINICAL DATA:  Hypoxia, abnormal lung sounds. EXAM: CHEST - RIGHT DECUBITUS COMPARISON:  None. FINDINGS: Mild free flowing right pleural effusion is noted. IMPRESSION: Mild free flowing right pleural effusion. Electronically Signed   By: Lupita Raider M.D.   On: 08/21/2020 13:00    Procedures Procedures (including critical care time)  CRITICAL CARE Performed by: Richardean Canal   Total critical care time: 30 minutes  Critical care time was exclusive of separately billable procedures and treating other patients.  Critical care was necessary to treat or prevent imminent or life-threatening deterioration.  Critical care was time spent personally by me on the following activities: development of treatment plan with patient and/or surrogate as well as nursing, discussions with consultants, evaluation of patient's response to treatment, examination of patient, obtaining history from patient or surrogate, ordering and performing treatments and interventions, ordering and review of laboratory studies,  ordering and review of radiographic studies, pulse oximetry and re-evaluation of patient's condition.   Medications Ordered in ED Medications  methylPREDNISolone sodium succinate (SOLU-MEDROL) 125 mg/2 mL injection 125 mg (has no administration in time range)  albuterol (VENTOLIN HFA) 108 (90 Base) MCG/ACT inhaler 2 puff (has no administration in time range)    ED Course  I have reviewed the triage vital signs and the nursing notes.  Pertinent labs & imaging results that were available during my care of the patient were reviewed by me and considered in my medical decision making (see chart for details).    MDM Rules/Calculators/A&P  Hector Beltran is a 86 y.o. male here with SOB. Patient is hypoxic to low 80s. Patient has wheezing on exam. Patient does have bilateral leg swelling and some drainage L toes. I am concerned for CHF vs PE vs COPD. Will get cbc, cmp, trop, BNP, CXR, CTA chest. Will give albuterol, steroids.   6:38 PM Labs showed BNP 280.  CT showed bilateral pleural effusion that is partially loculated on the right.  Patient also has bibasilar atelectasis.  Given Lasix.  Covid test is negative.  Likely has some bronchitis and new onset CHF.  Given Lasix and steroids and albuterol.  Hospitalist to admit since he is hypoxic.    Final Clinical Impression(s) / ED Diagnoses Final diagnoses:  None    Rx / DC Orders ED Discharge Orders    None       Charlynne Pander, MD 08/22/20 1840

## 2020-08-22 NOTE — ED Notes (Signed)
Pt had unhooked self from monitor and taken of [place] to get on commode. PT back in bed, put back on monitor, and pt put back on O2.  Pt sating mid70s initially. O2 now above 93% on 3LNC

## 2020-08-22 NOTE — H&P (Addendum)
History and Physical    Hector Beltran TKW:[id number] DOB: May 29, 1928 DOA: 08/22/2020  PCP: Renford Dills, MD  Patient coming from: Independent Living Facility   Chief Complaint:  Chief Complaint  Patient presents with  . Shortness of Breath     HPI:    86 year old male with past medical history of peripheral neuropathy, COPD and nicotine dependence who presents to [facility] emergency department with complaints of shortness of breath.  Patient explains that for the past several months he has been experiencing bilateral lower extremity swelling.  He thought that it was due to his previous diagnosis of neuropathy and states that when he had his annual checkup with his primary care provider several months ago he was told that "everything was fine."  As the months progressed patient's bilateral lower extremity swelling progressively worsened.  Patient explains that shortly after Thanksgiving, he noticed that he began to develop shortness of breath.  The shortness of breath was initially mild in intensity but has become progressively more more severe.  Shortness breath is worse with exertion and improved with rest.  Is that over the span of time he has also noticed that he needs to sleep in a recliner.  Patient reports becoming short of breath when lying flat but denies paroxysmal nocturnal dyspnea.  Patient denies any associated chest pain.  Patient does complain of increasing cough over the same span of time.  Patient states that his cough is productive but is unable to describe the color.  Patient denies any fever, sick contact, recent travel or confirmed contact with COVID-19 infection.  Of note, patient is vaccinated for COVID-19.  Patient symptoms of peripheral edema cough and shortness of breath continued to worsen.  This morning, the patient was attempting to push himself up off of his toilet his hand slipped and he fell to the ground.  Patient denies any loss of consciousness or  head trauma.  EMS was then called and the patient was brought into [facility] emergency department for evaluation.  Upon evaluation in the emergency department, patient was found to be hypoxic with oxygen saturations in the in the low 80s.  Patient was placed on supplemental oxygen, now on 3 L of oxygen via nasal cannula.  Patient underwent CT angiogram of the chest which revealed no evidence of pulmonary embolism but did reveal bilateral pleural effusions as well as an incidental finding of a cirrhotic liver and 6.3 x 5.1 cm cyst in the left upper quadrant of the liver with MRI recommended by radiology.  Patient was additionally found to have an elevated BNP of 281.  Patient was administered a dose of 40 mg of IV Lasix due to concerns for acute congestive heart failure.  The hospitalist group was then called to assess patient for admission to the hospital.  Review of Systems:   Review of Systems  Constitutional: Positive for malaise/fatigue.  Respiratory: Positive for cough, sputum production, shortness of breath and wheezing.   Cardiovascular: Positive for orthopnea and leg swelling.  All other systems reviewed and are negative.   Past Medical History:  Diagnosis Date  . Gait abnormality 02/09/2020  . Peripheral neuropathy 09/30/2017    Past Surgical History:  Procedure Laterality Date  . CYST REMOVAL NECK       reports that he has been smoking cigarettes. He has never used smokeless tobacco. He reports current alcohol use of about 3.0 standard drinks of alcohol per week. He reports that he does not use drugs.  No Known Allergies  History reviewed. No pertinent family history.   Prior to Admission medications   Medication Sig Start Date End Date Taking? Authorizing Provider  OVER THE COUNTER MEDICATION Omega q plus daily    [provider]  OVER THE COUNTER MEDICATION trexar take  daily    [provider]    Physical Exam: Vitals:   08/22/20 1700  08/22/20 1830 08/22/20 1930 08/22/20 1934  BP: (!) 128/95 (!) 144/83 (!) 147/89   Pulse: 87 92 (!) 31 95  Resp: (!) 22 (!) 24 20 (!) 26  Temp:      TempSrc:      SpO2: 95% 96%  95%  Weight:      Height:        Constitutional: Acute alert and oriented x3, patient is in mild respiratory distress. Skin: no rashes, no lesions, good skin turgor noted. Eyes: Pupils are equally reactive to light.  No evidence of scleral icterus or conjunctival pallor.  ENMT: Moist mucous membranes noted.  Posterior pharynx clear of any exudate or lesions.   Neck: normal, supple, no masses, no thyromegaly.  Noted elevation of the jugular venous pulse at 45 degrees. Respiratory: Coarse breath sounds bilaterally with evidence of prolonged aspiratory phase, expiratory wheezing and evidence of bilateral mid and lower field rales.  Patient is exhibiting tachypnea without evidence of accessory muscle use.   Cardiovascular: Regular rate and rhythm, no murmurs / rubs / gallops.  Significant bilateral lower extremity pitting edema that tracks in the bilateral feet all the way up to the thighs. 2+ pedal pulses. No carotid bruits.  Chest:   Nontender without crepitus or deformity.   Back:   Nontender without crepitus or deformity. Abdomen: Notably protuberant abdomen that is soft and nontender.  No evidence of intra-abdominal masses.  Positive bowel sounds noted in all quadrants.   Musculoskeletal: No joint deformity upper and lower extremities. Good ROM, no contractures. Normal muscle tone.  Neurologic: CN 2-12 grossly intact. Sensation intact.  Patient moving all 4 extremities spontaneously.  Patient is following all commands.  Patient is responsive to verbal stimuli.   Psychiatric: Patient exhibits depressed mood with flat affect.  Patient seems to possess insight as to their current situation.     Labs on Admission: I have personally reviewed following labs and imaging studies -   CBC: Recent Labs  Lab 08/22/20 1627  08/22/20 1640  WBC 5.3  --   NEUTROABS 3.8  --   HGB 15.3 16.7  HCT 49.1 49.0  MCV 102.9*  --   PLT 138*  --    Basic Metabolic Panel: Recent Labs  Lab 08/22/20 1627 08/22/20 1640  NA 141 140  K 5.3* 5.1  CL 101 101  CO2 30  --   GLUCOSE 98 95  BUN 28* 33*  CREATININE 1.08 1.00  CALCIUM 8.5*  --    GFR: Estimated Creatinine Clearance: 60.3 mL/min (by C-G formula based on SCr of 1 mg/dL). Liver Function Tests: Recent Labs  Lab 08/22/20 1627  AST 26  ALT 19  ALKPHOS 62  BILITOT 1.1  PROT 6.6  ALBUMIN 3.3*   No results for input(s): LIPASE, AMYLASE in the last 168 hours. No results for input(s): AMMONIA in the last 168 hours. Coagulation Profile: No results for input(s): INR, PROTIME in the last 168 hours. Cardiac Enzymes: No results for input(s): CKTOTAL, CKMB, CKMBINDEX, TROPONINI in the last 168 hours. BNP (last 3 results) No results for input(s): PROBNP in the  last 8760 hours. HbA1C: No results for input(s): HGBA1C in the last 72 hours. CBG: No results for input(s): GLUCAP in the last 168 hours. Lipid Profile: No results for input(s): CHOL, HDL, LDLCALC, TRIG, CHOLHDL, LDLDIRECT in the last 72 hours. Thyroid Function Tests: No results for input(s): TSH, T4TOTAL, FREET4, T3FREE, THYROIDAB in the last 72 hours. Anemia Panel: No results for input(s): VITAMINB12, FOLATE, FERRITIN, TIBC, IRON, RETICCTPCT in the last 72 hours. Urine analysis:    Component Value Date/Time   COLORURINE YELLOW 11/19/2018 1047   APPEARANCEUR CLEAR 11/19/2018 1047   LABSPEC 1.018 11/19/2018 1047   PHURINE 5.0 11/19/2018 1047   GLUCOSEU NEGATIVE 11/19/2018 1047   HGBUR NEGATIVE 11/19/2018 1047   BILIRUBINUR NEGATIVE 11/19/2018 1047   KETONESUR NEGATIVE 11/19/2018 1047   PROTEINUR NEGATIVE 11/19/2018 1047   NITRITE NEGATIVE 11/19/2018 1047   LEUKOCYTESUR LARGE (A) 11/19/2018 1047    Radiological Exams on Admission - Personally Reviewed: DG Chest 2 View  Result Date:  08/21/2020 CLINICAL DATA:  Hypoxia. EXAM: CHEST - 2 VIEW COMPARISON:  November 20, 2018. FINDINGS: Stable cardiomegaly. No pneumothorax is noted. Left lung is clear. Mild right basilar atelectasis or infiltrate is noted with small right pleural effusion. Bony thorax is unremarkable. IMPRESSION: Mild right basilar atelectasis or infiltrate is noted with small right pleural effusion. Electronically Signed   By: Lupita RaiderJames  Green Jr M.D.   On: 08/21/2020 13:01   CT Angio Chest PE W and/or Wo Contrast  Result Date: 08/22/2020 CLINICAL DATA:  Shortness of breath. EXAM: CT ANGIOGRAPHY CHEST WITH CONTRAST TECHNIQUE: Multidetector CT imaging of the chest was performed using the standard protocol during bolus administration of intravenous contrast. Multiplanar CT image reconstructions and MIPs were obtained to evaluate the vascular anatomy. CONTRAST:  60mL OMNIPAQUE IOHEXOL 350 MG/ML SOLN COMPARISON:  None. FINDINGS: Cardiovascular: There is mild calcification of the aortic arch. Satisfactory opacification of the pulmonary arteries to the segmental level. No evidence of pulmonary embolism. There is moderate severity cardiomegaly with moderate to marked severity coronary artery calcification. No pericardial effusion. Mediastinum/Nodes: No enlarged mediastinal, hilar, or axillary lymph nodes. Thyroid gland, trachea, and esophagus demonstrate no significant findings. Lungs/Pleura: Mild biapical scarring and/or atelectasis is seen. Mild areas of atelectasis and/or infiltrate are noted within the bilateral lung bases. There is a small right pleural effusion with small partially loculated components. A very small left pleural effusion is also seen. No pneumothorax is identified. Upper Abdomen: The liver is cirrhotic in appearance. A 1.5 cm x 1.3 cm cystic appearing area is seen within the left lobe of the liver. Similar appearing 1.3 cm x 0.6 cm and 1.3 cm x 1.2 cm areas of low attenuation are seen within the right lobe. A mild to  moderate amount of perihepatic and perisplenic fluid is noted. A 6.3 cm x 5.1 cm cyst is seen within the left upper quadrant (incompletely imaged). Musculoskeletal: Marked severity multilevel degenerative changes seen throughout the thoracic spine. Review of the MIP images confirms the above findings. IMPRESSION: 1. No evidence of pulmonary embolism. 2. Mild bibasilar atelectasis and/or infiltrate. 3. Small bilateral pleural effusions with partially loculated components on the right. 4. Moderate severity cardiomegaly with moderate to marked severity coronary artery calcification. 5. Cirrhotic liver with multiple small hepatic cysts and/or hemangiomas. Correlation with MRI is recommended. 6. 6.3 cm x 5.1 cm cyst within the left upper quadrant (incompletely imaged). Further correlation with nonemergent MRI of the abdomen is recommended to further determine its origin. 7. Aortic atherosclerosis. Aortic Atherosclerosis (ICD10-I70.0).  Electronically Signed   By: Aram Candela M.D.   On: 08/22/2020 18:08   DG Chest Right Decubitus  Result Date: 08/21/2020 CLINICAL DATA:  Hypoxia, abnormal lung sounds. EXAM: CHEST - RIGHT DECUBITUS COMPARISON:  None. FINDINGS: Mild free flowing right pleural effusion is noted. IMPRESSION: Mild free flowing right pleural effusion. Electronically Signed   By: Lupita Raider M.D.   On: 08/21/2020 13:00   DG Chest Port 1 View  Result Date: 08/22/2020 CLINICAL DATA:  Shortness of breath. EXAM: PORTABLE CHEST 1 VIEW COMPARISON:  08/21/2020. FINDINGS: Small right pleural effusion. Mild diffuse interstitial prominence. Right basilar opacities. No visible pneumothorax. Mildly enlarged cardiac silhouette. No acute osseous abnormality. IMPRESSION: 1. Mild diffuse interstitial prominence, which may represent mild interstitial edema. 2. Small right pleural effusion with overlying right basilar opacities, which could represent atelectasis, aspiration, and/or pneumonia. Electronically Signed    By: Feliberto Harts MD   On: 08/22/2020 16:41    EKG: Personally reviewed.  Rhythm is normal sinus rhythm with heart rate of 93 bpm.  No dynamic ST segment changes appreciated.  Assessment/Plan Principal Problem:   Acute CHF (congestive heart failure) (HCC)   Patient presenting with several month history of worsening bilateral lower extremity pitting edema with a several week history of progressively worsening dyspnea on exertion and pillow orthopnea all concerning for acute congestive heart failure.  CT imaging of the chest reveals bilateral pleural effusions with a questionable loculated component.  BNP is somewhat elevated at 281  Patient is concurrently found to be suffering from acute hypoxic respiratory failure requiring supplemental oxygen  Patient is already been initiated on intravenous Lasix by the emergency department provider.  Since patient is Lasix nave, we will continue patient on a regimen of Lasix 20 mg IV twice daily  Strict input and output monitoring  Renal function and electrolytes with serial chemistries  Monitoring patient on telemetry  Echocardiogram ordered for the morning  Continuing supplemental oxygen which will be weaned as tolerated  Active Problems: Acute respiratory failure with hypoxia  Thought to be secondary to acute congestive heart failure, COPD exacerbation and bilateral pleural effusions  Providing patient with submental oxygen  Treating underlying COPD exacerbation and acute congestive heart failure with intravenous diuretics and aggressive bronchodilator therapy with systemic steroid  Wean patient off submental oxygen as tolerated  Please see remainder of assessment and plan above    Cirrhosis of liver (HCC)   Incidental finding of findings consistent with cirrhosis of the liver on CT imaging of the chest  Hepatic function panel does not reveal substantial elevated alkaline phosphatase or bilirubin and only mildly elevated  transaminases  However patient does exhibit evidence of thrombocytopenia and hypoalbuminemia  Patient does have an elevated MCV of 102.9 but denies heavy alcohol use drinking at most 1 shot of liquor daily and this is only recently  It is possible that cirrhosis is secondary to congestive hepatopathy, obtaining echocardiogram in the morning  Obtaining hepatitis panel as well as coagulation profile  If echocardiogram tomorrow morning is unremarkable will expand work-up of cirrhosis consider GI consultation    COPD with acute exacerbation (HCC)   Patient exhibiting significant expiratory wheezing with prolonged expiratory phase on examination consistent with COPD exacerbation  Patient reports longstanding history of smoking since he was 86 years old  Placing patient on scheduled bronchodilator therapy  Systemic steroids for now with oral prednisone which will be increased intravenous steroids if patient fails to clinically improve  Submental oxygen for concurrent  acute hypoxic respiratory failure    Nicotine dependence, cigarettes, uncomplicated   Counseling patient on cessation daily    Hyperkalemia   Very mild hyperkalemia on initial chemistry  No evidence of associated EKG changes  Hyperkalemia will likely quickly resolved with intravenous diuretic  Monitoring potassium levels with serial chemistries    Hepatic cyst  Incidental finding of 6.3 x 5.1 cm cyst in the left upper quadrant on CT angiogram of the chest  Radiology is recommending nonemergent MRI of the abdomen   once patient is clinically improved and can lay flat after diuresis determination can be made by the day team as to whether or not this should be performed inpatient versus outpatient  Elevated troponin without myocardial infarction  Patient is currently chest pain-free  Slight elevation of troponins likely secondary to supply demand mismatch in the setting of acute congestive heart  failure  Cycling cardiac enzymes  Monitoring patient on telemetry  Obtaining echocardiogram in the morning  Code Status:  DNR Family Communication: Son is at bedside who has been updated on plan of care  Status is: Observation  The patient remains OBS appropriate and will d/c before 2 midnights.  Dispo: The patient is from: Independent living facility              Anticipated d/c is to: independent living facility              Anticipated d/c date is: 2 days              Patient currently is not medically stable to d/c.        Marinda Elk MD [facility] Pager [phone]  If 7PM-7AM, please contact night-coverage [email or web address] Use universal [place] password for that web site. If you do not have the password, please call the hospital operator.  08/22/2020, 8:51 PM

## 2020-08-22 NOTE — ED Notes (Signed)
Increased [place] to 6 l due to drop in oxygen saturations.

## 2020-08-23 ENCOUNTER — Inpatient Hospital Stay (HOSPITAL_COMMUNITY): Payer: Medicare PPO

## 2020-08-23 DIAGNOSIS — J9601 Acute respiratory failure with hypoxia: Secondary | ICD-10-CM | POA: Diagnosis present

## 2020-08-23 DIAGNOSIS — R269 Unspecified abnormalities of gait and mobility: Secondary | ICD-10-CM | POA: Diagnosis present

## 2020-08-23 DIAGNOSIS — R778 Other specified abnormalities of plasma proteins: Secondary | ICD-10-CM | POA: Diagnosis not present

## 2020-08-23 DIAGNOSIS — J441 Chronic obstructive pulmonary disease with (acute) exacerbation: Secondary | ICD-10-CM | POA: Diagnosis present

## 2020-08-23 DIAGNOSIS — I5033 Acute on chronic diastolic (congestive) heart failure: Secondary | ICD-10-CM | POA: Diagnosis present

## 2020-08-23 DIAGNOSIS — I509 Heart failure, unspecified: Secondary | ICD-10-CM | POA: Diagnosis not present

## 2020-08-23 DIAGNOSIS — G629 Polyneuropathy, unspecified: Secondary | ICD-10-CM | POA: Diagnosis present

## 2020-08-23 DIAGNOSIS — K7460 Unspecified cirrhosis of liver: Secondary | ICD-10-CM | POA: Diagnosis present

## 2020-08-23 DIAGNOSIS — R0902 Hypoxemia: Secondary | ICD-10-CM | POA: Diagnosis not present

## 2020-08-23 DIAGNOSIS — I5021 Acute systolic (congestive) heart failure: Secondary | ICD-10-CM | POA: Diagnosis not present

## 2020-08-23 DIAGNOSIS — B356 Tinea cruris: Secondary | ICD-10-CM | POA: Diagnosis not present

## 2020-08-23 DIAGNOSIS — E875 Hyperkalemia: Secondary | ICD-10-CM | POA: Diagnosis present

## 2020-08-23 DIAGNOSIS — Z66 Do not resuscitate: Secondary | ICD-10-CM | POA: Diagnosis present

## 2020-08-23 DIAGNOSIS — F17210 Nicotine dependence, cigarettes, uncomplicated: Secondary | ICD-10-CM | POA: Diagnosis present

## 2020-08-23 DIAGNOSIS — D696 Thrombocytopenia, unspecified: Secondary | ICD-10-CM | POA: Diagnosis present

## 2020-08-23 DIAGNOSIS — Z20822 Contact with and (suspected) exposure to covid-19: Secondary | ICD-10-CM | POA: Diagnosis present

## 2020-08-23 DIAGNOSIS — E8809 Other disorders of plasma-protein metabolism, not elsewhere classified: Secondary | ICD-10-CM | POA: Diagnosis present

## 2020-08-23 DIAGNOSIS — R0602 Shortness of breath: Secondary | ICD-10-CM | POA: Diagnosis present

## 2020-08-23 LAB — CBC WITH DIFFERENTIAL/PLATELET
Abs Immature Granulocytes: 0.03 10*3/uL (ref 0.00–0.07)
Basophils Absolute: 0 10*3/uL (ref 0.0–0.1)
Basophils Relative: 0 %
Eosinophils Absolute: 0 10*3/uL (ref 0.0–0.5)
Eosinophils Relative: 0 %
HCT: 48.9 % (ref 39.0–52.0)
Hemoglobin: 15.3 g/dL (ref 13.0–17.0)
Immature Granulocytes: 1 %
Lymphocytes Relative: 8 %
Lymphs Abs: 0.3 10*3/uL — ABNORMAL LOW (ref 0.7–4.0)
MCH: 31.9 pg (ref 26.0–34.0)
MCHC: 31.3 g/dL (ref 30.0–36.0)
MCV: 101.9 fL — ABNORMAL HIGH (ref 80.0–100.0)
Monocytes Absolute: 0.1 10*3/uL (ref 0.1–1.0)
Monocytes Relative: 3 %
Neutro Abs: 3.5 10*3/uL (ref 1.7–7.7)
Neutrophils Relative %: 88 %
Platelets: 135 10*3/uL — ABNORMAL LOW (ref 150–400)
RBC: 4.8 MIL/uL (ref 4.22–5.81)
RDW: 14.2 % (ref 11.5–15.5)
WBC: 4 10*3/uL (ref 4.0–10.5)
nRBC: 0 % (ref 0.0–0.2)

## 2020-08-23 LAB — HEPATITIS PANEL, ACUTE
HCV Ab: NONREACTIVE
Hep A IgM: NONREACTIVE
Hep B C IgM: NONREACTIVE
Hepatitis B Surface Ag: NONREACTIVE

## 2020-08-23 LAB — APTT: aPTT: 34 seconds (ref 24–36)

## 2020-08-23 LAB — ECHOCARDIOGRAM COMPLETE
Area-P 1/2: 4.8 cm2
Height: 76 in
S' Lateral: 4.2 cm
Weight: 3600 oz

## 2020-08-23 LAB — BASIC METABOLIC PANEL
Anion gap: 11 (ref 5–15)
BUN: 29 mg/dL — ABNORMAL HIGH (ref 8–23)
CO2: 33 mmol/L — ABNORMAL HIGH (ref 22–32)
Calcium: 8.5 mg/dL — ABNORMAL LOW (ref 8.9–10.3)
Chloride: 98 mmol/L (ref 98–111)
Creatinine, Ser: 1.22 mg/dL (ref 0.61–1.24)
GFR, Estimated: 56 mL/min — ABNORMAL LOW (ref >60)
Glucose, Bld: 152 mg/dL — ABNORMAL HIGH (ref 70–99)
Potassium: 5.6 mmol/L — ABNORMAL HIGH (ref 3.5–5.1)
Sodium: 142 mmol/L (ref 135–145)

## 2020-08-23 LAB — MAGNESIUM: Magnesium: 2.1 mg/dL (ref 1.7–2.4)

## 2020-08-23 LAB — PROTIME-INR
INR: 1.1 (ref 0.8–1.2)
Prothrombin Time: 14.1 seconds (ref 11.4–15.2)

## 2020-08-23 MED ORDER — ENSURE ENLIVE PO LIQD
237.0000 mL | Freq: Two times a day (BID) | ORAL | Status: DC
  Administered 2020-08-23 – 2020-08-31 (×12): 237 mL via ORAL
  Filled 2020-08-23 (×9): qty 237

## 2020-08-23 MED ORDER — IPRATROPIUM-ALBUTEROL 0.5-2.5 (3) MG/3ML IN SOLN: 3.0000 mL | Freq: Two times a day (BID) | RESPIRATORY_TRACT | Status: DC

## 2020-08-23 MED ORDER — SODIUM ZIRCONIUM CYCLOSILICATE 10 G PO PACK
10.0000 g | PACK | Freq: Once | ORAL | Status: AC
  Administered 2020-08-23: 10 g via ORAL
  Filled 2020-08-23: qty 1

## 2020-08-23 NOTE — Evaluation (Signed)
Physical Therapy Evaluation Patient Details Name: Hector Beltran MRN: [id number] DOB: 1928-08-03 Today's Date: 08/23/2020   History of Present Illness  86yo male c/o progressive SOB and BLE edema. Had a fall on 12/8 when trying to stand up from the toilet. Found to be hypoxic in the ED. PE negative. Admitted with acute CHF and acute respiratory failure with hypoxia. PMH gait abnormality, peripheral neuropathy, COPD, nicotene dependence  Clinical Impression   Patient received in recliner, pleasant but impulsive and with little carryover of cues for safety/sequencing. Generally unsteady when up on his feet with and without RW and needed MinA/Mod VC for safety/fall prevention. Tolerated gait training about 146ft in the hallway with RW with MinA for device management and balance- shows very unsafe use of RW despite repeated multimodal cues from PT. Unable to get much of any pulse ox signal with dynamic activities but feel he likely had a significant O2 desaturation while ambulating on 3LPM due to light headedness which resolved with increase to 4LPM O2. Seemed to have a really hard time with abstract ideas like what short term rehab is and why it might be beneficial. Left up in recliner with VSS on 3LPM, chair alarm active and all needs met, RN aware of patient status. Would very much benefit from SNF prior to return home alone.     Follow Up Recommendations SNF;Supervision/Assistance - 24 hour    Equipment Recommendations  Rolling walker with 5" wheels;3in1 (PT)    Recommendations for Other Services       Precautions / Restrictions Precautions Precautions: Fall;Other (comment) Precaution Comments: very poor safety awareness/impulsive, watch sats Restrictions Weight Bearing Restrictions: No      Mobility  Bed Mobility               General bed mobility comments: up in recliner upon entry    Transfers Overall transfer level: Needs assistance Equipment used: Rolling walker (2  wheeled) Transfers: Sit to/from Stand Sit to Stand: Min guard         General transfer comment: min guard for safety, cues for hand placement and sequencing  Ambulation/Gait Ambulation/Gait assistance: Min assist Gait Distance (Feet): 100 Feet Assistive device: Rolling walker (2 wheeled) Gait Pattern/deviations: Step-through pattern;Decreased step length - right;Decreased step length - left;Decreased stride length;Decreased dorsiflexion - left;Decreased dorsiflexion - right;Trendelenburg;Drifts right/left;Trunk flexed Gait velocity: decreased   General Gait Details: very unsafe with RW, tends to push it far too much away from him and has difficulty staying appropriate distance from it especially on turns- MinA for balance and safety.  Stairs            Wheelchair Mobility    Modified Rankin (Stroke Patients Only)       Balance Overall balance assessment: Needs assistance;Mild deficits observed, not formally tested Sitting-balance support: No upper extremity supported;Feet supported Sitting balance-Leahy Scale: Good     Standing balance support: Bilateral upper extremity supported;During functional activity Standing balance-Leahy Scale: Poor Standing balance comment: unsafe use of RW and difficult to correct as he goes right back to pushing it far in front of him, MinA to maintain standing balance with and without UE support                             Pertinent Vitals/Pain Pain Assessment: No/denies pain    Home Living Family/patient expects to be discharged to:: Other (Comment) (independent living at [facility]) Living Arrangements: Alone  Additional Comments: had the fall getting up off of the toilet- no other falls otherwise    Prior Function Level of Independence: Independent with assistive device(s)         Comments: has a rollator and a tripod walker; does water aerobics 2-3 times per week along with 1 exercises session  [place]" [place] [facility]     Hand Dominance        Extremity/Trunk Assessment   Upper Extremity Assessment Upper Extremity Assessment: Defer to OT evaluation    Lower Extremity Assessment Lower Extremity Assessment: Generalized weakness    Cervical / Trunk Assessment Cervical / Trunk Assessment: Kyphotic  Communication   Communication: No difficulties  Cognition Arousal/Alertness: Awake/alert Behavior During Therapy: Impulsive;WFL for tasks assessed/performed Overall Cognitive Status: Impaired/Different from baseline Area of Impairment: Attention;Safety/judgement;Problem solving;Awareness;Memory                   Current Attention Level: Selective Memory: Decreased short-term memory   Safety/Judgement: Decreased awareness of safety Awareness: Intellectual Problem Solving: Difficulty sequencing;Requires verbal cues General Comments: impulsive and with very poor insight into current deficits and overall safety; seemed to have a hard time understanding more abstract ideas like how going to SNF might be beneficial      General Comments General comments (skin integrity, edema, etc.): SPO2 94% on 3LPM at rest; difficult to get a reading on pulse ox during gait, but feel he likely did desat into low-mid 80s on 3LPM due to severe light headedness which resolved with increase to 4LPM    Exercises     Assessment/Plan    PT Assessment Patient needs continued PT services  PT Problem List Decreased strength;Decreased knowledge of use of DME;Decreased activity tolerance;Decreased safety awareness;Decreased balance;Decreased mobility;Decreased coordination;Cardiopulmonary status limiting activity       PT Treatment Interventions DME instruction;Balance training;Gait training;Functional mobility training;Patient/family education;Therapeutic activities;Therapeutic exercise    PT Goals (Current goals can be found in the Care Plan section)  Acute Rehab PT Goals Patient Stated  Goal: go back to his regular routine PT Goal Formulation: With patient Time For Goal Achievement: 09/06/20 Potential to Achieve Goals: Fair    Frequency Min 3X/week   Barriers to discharge Decreased caregiver support      Co-evaluation               AM-PAC PT "6 Clicks" Mobility  Outcome Measure Help needed turning from your back to your side while in a flat bed without using bedrails?: A Little Help needed moving from lying on your back to sitting on the side of a flat bed without using bedrails?: A Little Help needed moving to and from a bed to a chair (including a wheelchair)?: A Little Help needed standing up from a chair using your arms (e.g., wheelchair or bedside chair)?: A Little Help needed to walk in hospital room?: A Little Help needed climbing 3-5 steps with a railing? : A Lot 6 Click Score: 17    End of Session Equipment Utilized During Treatment: Gait belt;Oxygen Activity Tolerance: Patient tolerated treatment well Patient left: in chair;with call bell/phone within reach;with chair alarm set Nurse Communication: Mobility status;Precautions PT Visit Diagnosis: Unsteadiness on feet (R26.81);Muscle weakness (generalized) (M62.81);History of falling (Z91.81);Difficulty in walking, not elsewhere classified (R26.2)    Time: 1225-1300 PT Time Calculation (min) (ACUTE ONLY): 35 min   Charges:   PT Evaluation $PT Eval Moderate Complexity: 1 Mod PT Treatments $Gait Training: 8-22 mins        Bear Osten U PT,  DPT, PN1   Supplemental Physical Therapist [place]    Pager [phone] Acute Rehab Office [phone]

## 2020-08-23 NOTE — Progress Notes (Signed)
  Echocardiogram 2D Echocardiogram has been performed.  Tye Savoy 08/23/2020, 3:48 PM

## 2020-08-23 NOTE — Progress Notes (Signed)
PROGRESS NOTE    Hector Beltran  ONG:[id number] DOB: 04/05/1928 DOA: 08/22/2020 PCP: Renford Dills, MD    Brief Narrative:  Hector Beltran is a 86 year old male with past medical history significant for COPD, peripheral neuropathy, chronic diastolic congestive heart failure, nicotine dependence who presented to [facility] ED with complaints of progressive shortness of breath and bilateral lower extremity swelling.  Following Thanksgiving, patient noticed increasing shortness of breath with lower extremity edema; now requiring to sleep in a recliner.  Patient also notes nonproductive cough.  Denies fever/chills, no recent sick contacts, no recent travel and no confirmed contact with individuals positive for Covid-19 viral infection.  In the ED, patient was noted to be hypoxic with oxygen saturations in the low 80s; and placed on supplemental oxygen.  Patient is afebrile without leukocytosis.  CT angiogram chest negative for pulmonary embolism but does note bilateral pleural effusions and cirrhotic appearing liver with multiple cysts and large cyst left upper quadrant measuring 6.3 x 5.1 cm.  BNP elevated 281.  Patient was given IV furosemide 40 mg.  Hospitalist service consulted for further evaluation and management.   Assessment & Plan:   Principal Problem:   Acute CHF (congestive heart failure) (HCC) Active Problems:   Cirrhosis of liver (HCC)   COPD with acute exacerbation (HCC)   Nicotine dependence, cigarettes, uncomplicated   Hyperkalemia   Hepatic cyst   Elevated troponin level not due myocardial infarction   Acute respiratory failure with hypoxia (HCC)   Acute hypoxic respiratory failure Patient presenting to the ED for progressive dyspnea over the last few months.  Patient was found to be hypoxic on presentation with SPO2 in the 80s.  Nonoxygen dependent at baseline.  Etiology likely multifactorial with increased lower extremity edema and elevated BNP consistent with congestive heart  failure exacerbation in conjunction with COPD exacerbation in conjunction with continued tobacco use disorder. --Continuous home oxygen, maintain SPO2 greater than 88% --Continue treatment as below  Acute on chronic diastolic congestive heart failure Patient with worsening lower extremity edema over the past few months.  Elevated BNP. --Furosemide 20 mg IV BID --TTE: Pending --Strict I's and O's and daily weights --Continue to monitor BMP daily while on aggressive IV diuresis  Acute COPD exacerbation Patient with wheezing and hypoxia on presentation.  Continues with tobacco abuse.  Not on oxygen at baseline. --Prednisone 40 mg p.o. daily --DuoNebs every 6 hours --Albuterol nebs every 2 hours as needed for shortness of breath/wheezing --Continue submental oxygen, titrate to maintain SPO2 greater than 88%  Hyperkalemia Potassium 5.6 this morning. --Will treat with Lokelma 10 mg p.o. x1 today --Continue monitor on telemetry --Follow electrolytes closely daily  Cirrhosis with multiple hepatic cysts Incidental finding on CT angiogram chest with cirrhotic liver with multiple small hepatic cysts.  Does not follow with gastroenterology. --Consider further outpatient work-up with GI referral versus MR abdomen with liver protocol  Nicotine dependence Counseled on need for complete cessation.   DVT prophylaxis: Lovenox Code Status: DNR Family Communication: No family present at bedside this morning  Disposition Plan:  Status is: Observation  The patient remains OBS appropriate and will d/c before 2 midnights.  Dispo: The patient is from: ALF              Anticipated d/c is to: ALF              Anticipated d/c date is: 2 days              Patient currently is  not medically stable to d/c.   Consultants:   None  Procedures:   None  Antimicrobials:   None   Subjective: Patient seen and examined at bedside, sitting in bedside chair eating breakfast.  Continues with  dyspnea and lower extremity edema, now titrated down from 7 L nasal cannula to 3 L nasal cannula this morning.  Reports good urine output.  States "would like to be part of the treatment team/plan".  Nursing present at bedside.  No family present this morning.  Denies headache, no chest pain, no palpitations, no abdominal pain, no fever/chills/night sweats, no nausea/vomiting/diarrhea.  No acute events overnight per nursing staff.  Objective: Vitals:   08/22/20 2330 08/23/20 0015 08/23/20 0203 08/23/20 0551  BP: (!) 139/102 133/87 130/60 125/73  Pulse: 92 93 84 82  Resp: 20 (!) 22 18 18   Temp:   (!) 97.5 F (36.4 C) 97.6 F (36.4 C)  TempSrc:   Oral Oral  SpO2: 99% 97% 96% 95%  Weight:      Height:        Intake/Output Summary (Last 24 hours) at 08/23/2020 1058 Last data filed at 08/23/2020 0800 Gross per 24 hour  Intake 240 ml  Output --  Net 240 ml   Filed Weights   08/22/20 1606  Weight: 102.1 kg    Examination:  General exam: Appears calm and comfortable, slightly conversationally dyspneic Respiratory system: Decreased breath sounds bilateral bases with crackles, late expiratory wheezing bilaterally, normal respiratory effort without accessory muscle use, on 3 L nasal cannula Cardiovascular system: S1 & S2 heard, RRR. No JVD, murmurs, rubs, gallops or clicks.  2+ pitting edema bilateral lower extremities to mid shin Gastrointestinal system: Abdomen is nondistended, soft and nontender. No organomegaly or masses felt. Normal bowel sounds heard. Central nervous system: Alert and oriented. No focal neurological deficits. Extremities: Symmetric 5 x 5 power. Skin: No rashes, lesions or ulcers Psychiatry: Judgement and insight appear normal. Mood & affect appropriate.     Data Reviewed: I have personally reviewed following labs and imaging studies  CBC: Recent Labs  Lab 08/22/20 1627 08/22/20 1640 08/23/20 0416  WBC 5.3  --  4.0  NEUTROABS 3.8  --  3.5  HGB 15.3 16.7  15.3  HCT 49.1 49.0 48.9  MCV 102.9*  --  101.9*  PLT 138*  --  135*   Basic Metabolic Panel: Recent Labs  Lab 08/22/20 1627 08/22/20 1640 08/23/20 0416  NA 141 140 142  K 5.3* 5.1 5.6*  CL 101 101 98  CO2 30  --  33*  GLUCOSE 98 95 152*  BUN 28* 33* 29*  CREATININE 1.08 1.00 1.22  CALCIUM 8.5*  --  8.5*  MG  --   --  2.1   GFR: Estimated Creatinine Clearance: 49.4 mL/min (by C-G formula based on SCr of 1.22 mg/dL). Liver Function Tests: Recent Labs  Lab 08/22/20 1627  AST 26  ALT 19  ALKPHOS 62  BILITOT 1.1  PROT 6.6  ALBUMIN 3.3*   No results for input(s): LIPASE, AMYLASE in the last 168 hours. No results for input(s): AMMONIA in the last 168 hours. Coagulation Profile: Recent Labs  Lab 08/23/20 0416  INR 1.1   Cardiac Enzymes: No results for input(s): CKTOTAL, CKMB, CKMBINDEX, TROPONINI in the last 168 hours. BNP (last 3 results) No results for input(s): PROBNP in the last 8760 hours. HbA1C: No results for input(s): HGBA1C in the last 72 hours. CBG: No results for input(s): GLUCAP in the last  168 hours. Lipid Profile: No results for input(s): CHOL, HDL, LDLCALC, TRIG, CHOLHDL, LDLDIRECT in the last 72 hours. Thyroid Function Tests: No results for input(s): TSH, T4TOTAL, FREET4, T3FREE, THYROIDAB in the last 72 hours. Anemia Panel: Recent Labs    08/22/20 2133  VITAMINB12 707  FOLATE 36.7   Sepsis Labs: No results for input(s): PROCALCITON, LATICACIDVEN in the last 168 hours.  Recent Results (from the past 240 hour(s))  Resp Panel by RT-PCR (Flu A&B, Covid) Nasopharyngeal Swab     Status: None   Collection Time: 08/22/20  4:27 PM   Specimen: Nasopharyngeal Swab; Nasopharyngeal(NP) swabs in vial transport medium  Result Value Ref Range Status   SARS Coronavirus 2 by RT PCR NEGATIVE NEGATIVE Final    Comment: (NOTE) SARS-CoV-2 target nucleic acids are NOT DETECTED.  The SARS-CoV-2 RNA is generally detectable in upper respiratory specimens  during the acute phase of infection. The lowest concentration of SARS-CoV-2 viral copies this assay can detect is 138 copies/mL. A negative result does not preclude SARS-Cov-2 infection and should not be used as the sole basis for treatment or other patient management decisions. A negative result may occur with  improper specimen collection/handling, submission of specimen other than nasopharyngeal swab, presence of viral mutation(s) within the areas targeted by this assay, and inadequate number of viral copies(<138 copies/mL). A negative result must be combined with clinical observations, patient history, and epidemiological information. The expected result is Negative.  Fact Sheet for Patients:  BloggerCourse.com  Fact Sheet for Healthcare Providers:  SeriousBroker.it  This test is no t yet approved or cleared by the [place] FDA and  has been authorized for detection and/or diagnosis of SARS-CoV-2 by FDA under an Emergency Use Authorization (EUA). This EUA will remain  in effect (meaning this test can be used) for the duration of the COVID-19 declaration under Section 564(b)(1) of the Act, 21 U.S.C.section 360bbb-3(b)(1), unless the authorization is terminated  or revoked sooner.       Influenza A by PCR NEGATIVE NEGATIVE Final   Influenza B by PCR NEGATIVE NEGATIVE Final    Comment: (NOTE) The Xpert Xpress SARS-CoV-2/FLU/RSV plus assay is intended as an aid in the diagnosis of influenza from Nasopharyngeal swab specimens and should not be used as a sole basis for treatment. Nasal washings and aspirates are unacceptable for Xpert Xpress SARS-CoV-2/FLU/RSV testing.  Fact Sheet for Patients: BloggerCourse.com  Fact Sheet for Healthcare Providers: SeriousBroker.it  This test is not yet approved or cleared by the [place] FDA and has been authorized for detection  and/or diagnosis of SARS-CoV-2 by FDA under an Emergency Use Authorization (EUA). This EUA will remain in effect (meaning this test can be used) for the duration of the COVID-19 declaration under Section 564(b)(1) of the Act, 21 U.S.C. section 360bbb-3(b)(1), unless the authorization is terminated or revoked.  Performed at [facility], [address], [place], [place] [address]          Radiology Studies: DG Chest 2 View  Result Date: 08/21/2020 CLINICAL DATA:  Hypoxia. EXAM: CHEST - 2 VIEW COMPARISON:  November 20, 2018. FINDINGS: Stable cardiomegaly. No pneumothorax is noted. Left lung is clear. Mild right basilar atelectasis or infiltrate is noted with small right pleural effusion. Bony thorax is unremarkable. IMPRESSION: Mild right basilar atelectasis or infiltrate is noted with small right pleural effusion. Electronically Signed   By: Lupita Raider M.D.   On: 08/21/2020 13:01   CT Angio Chest PE W and/or Wo Contrast  Result Date: 08/22/2020 CLINICAL DATA:  Shortness of breath. EXAM: CT ANGIOGRAPHY CHEST WITH CONTRAST TECHNIQUE: Multidetector CT imaging of the chest was performed using the standard protocol during bolus administration of intravenous contrast. Multiplanar CT image reconstructions and MIPs were obtained to evaluate the vascular anatomy. CONTRAST:  60mL OMNIPAQUE IOHEXOL 350 MG/ML SOLN COMPARISON:  None. FINDINGS: Cardiovascular: There is mild calcification of the aortic arch. Satisfactory opacification of the pulmonary arteries to the segmental level. No evidence of pulmonary embolism. There is moderate severity cardiomegaly with moderate to marked severity coronary artery calcification. No pericardial effusion. Mediastinum/Nodes: No enlarged mediastinal, hilar, or axillary lymph nodes. Thyroid gland, trachea, and esophagus demonstrate no significant findings. Lungs/Pleura: Mild biapical scarring and/or atelectasis is seen. Mild areas of atelectasis and/or infiltrate  are noted within the bilateral lung bases. There is a small right pleural effusion with small partially loculated components. A very small left pleural effusion is also seen. No pneumothorax is identified. Upper Abdomen: The liver is cirrhotic in appearance. A 1.5 cm x 1.3 cm cystic appearing area is seen within the left lobe of the liver. Similar appearing 1.3 cm x 0.6 cm and 1.3 cm x 1.2 cm areas of low attenuation are seen within the right lobe. A mild to moderate amount of perihepatic and perisplenic fluid is noted. A 6.3 cm x 5.1 cm cyst is seen within the left upper quadrant (incompletely imaged). Musculoskeletal: Marked severity multilevel degenerative changes seen throughout the thoracic spine. Review of the MIP images confirms the above findings. IMPRESSION: 1. No evidence of pulmonary embolism. 2. Mild bibasilar atelectasis and/or infiltrate. 3. Small bilateral pleural effusions with partially loculated components on the right. 4. Moderate severity cardiomegaly with moderate to marked severity coronary artery calcification. 5. Cirrhotic liver with multiple small hepatic cysts and/or hemangiomas. Correlation with MRI is recommended. 6. 6.3 cm x 5.1 cm cyst within the left upper quadrant (incompletely imaged). Further correlation with nonemergent MRI of the abdomen is recommended to further determine its origin. 7. Aortic atherosclerosis. Aortic Atherosclerosis (ICD10-I70.0). Electronically Signed   By: Aram Candela M.D.   On: 08/22/2020 18:08   DG Chest Right Decubitus  Result Date: 08/21/2020 CLINICAL DATA:  Hypoxia, abnormal lung sounds. EXAM: CHEST - RIGHT DECUBITUS COMPARISON:  None. FINDINGS: Mild free flowing right pleural effusion is noted. IMPRESSION: Mild free flowing right pleural effusion. Electronically Signed   By: Lupita Raider M.D.   On: 08/21/2020 13:00   DG Chest Port 1 View  Result Date: 08/22/2020 CLINICAL DATA:  Shortness of breath. EXAM: PORTABLE CHEST 1 VIEW COMPARISON:   08/21/2020. FINDINGS: Small right pleural effusion. Mild diffuse interstitial prominence. Right basilar opacities. No visible pneumothorax. Mildly enlarged cardiac silhouette. No acute osseous abnormality. IMPRESSION: 1. Mild diffuse interstitial prominence, which may represent mild interstitial edema. 2. Small right pleural effusion with overlying right basilar opacities, which could represent atelectasis, aspiration, and/or pneumonia. Electronically Signed   By: Feliberto Harts MD   On: 08/22/2020 16:41        Scheduled Meds: . enoxaparin (LOVENOX) injection  40 mg Subcutaneous Q24H  . furosemide  20 mg Intravenous BID  . ipratropium-albuterol  3 mL Nebulization Q6H  . predniSONE  40 mg Oral Q breakfast  . sodium chloride flush  3 mL Intravenous Q12H   Continuous Infusions: . sodium chloride       LOS: 0 days    Time spent: 38 minutes spent on chart review, discussion with nursing staff, consultants, updating family and interview/physical exam;  more than 50% of that time was spent in counseling and/or coordination of care.    Alvira Philips [place], DO [facility] Available via Epic secure chat 7am-7pm After these hours, please refer to coverage provider listed on [email or web address] 08/23/2020, 10:58 AM

## 2020-08-23 NOTE — Progress Notes (Signed)
Initial Nutrition Assessment  DOCUMENTATION CODES:   Not applicable  INTERVENTION:  Provide Ensure Enlive po BID, each supplement provides 350 kcal and 20 grams of protein  Encourage adequate PO intake.   NUTRITION DIAGNOSIS:   Increased nutrient needs related to chronic illness (COPD, CHF) as evidenced by estimated needs.  GOAL:   Patient will meet greater than or equal to 90% of their needs  MONITOR:   PO intake,Supplement acceptance,Skin,Weight trends,Labs,I & O's  REASON FOR ASSESSMENT:   Consult Assessment of nutrition requirement/status  ASSESSMENT:   86 year old male with past medical history significant for COPD, peripheral neuropathy, chronic diastolic congestive heart failure presents with progressive shortness of breath and bilateral lower extremity swelling.  Meal completion has been 100%. Pt reports having a good appetite currently and PTA with usual consumption of at least 3 meals a day with no difficulties. Pt reports coming from a living facility and all meals are provided for him. Pt currently on aggressive IV diuresis. RD to order nutritional supplementation to aid in caloric and protein needs.   NUTRITION - FOCUSED PHYSICAL EXAM:  Flowsheet Row Most Recent Value  Orbital Region Unable to assess  Upper Arm Region No depletion  Thoracic and Lumbar Region No depletion  Buccal Region Unable to assess  Temple Region Unable to assess  Clavicle Bone Region Moderate depletion  Clavicle and Acromion Bone Region Moderate depletion  Scapular Bone Region Unable to assess  Dorsal Hand Unable to assess  Patellar Region --  [edema]  Anterior Thigh Region --  [edema]  Posterior Calf Region --  [edema]  Edema (RD Assessment) Severe  Hair Reviewed  Eyes Reviewed  Mouth Reviewed  Skin Reviewed  Nails Reviewed     Labs and medications reviewed.   Diet Order:   Diet Order            Diet Heart Room service appropriate? Yes; Fluid consistency: Thin  Diet  effective now                 EDUCATION NEEDS:   Not appropriate for education at this time  Skin:  Skin Assessment: Reviewed RN Assessment  Last BM:  12/8  Height:   Ht Readings from Last 1 Encounters:  08/22/20 6\' 4"  (1.93 m)    Weight:   Wt Readings from Last 1 Encounters:  08/22/20 102.1 kg   BMI:  Body mass index is 27.39 kg/m.  Estimated Nutritional Needs:   Kcal:  1900-2050  Protein:  95-105 grams  Fluid:  1.9 L/day  14/08/21, MS, RD, LDN RD pager number/after hours weekend pager number on Amion.

## 2020-08-24 LAB — BASIC METABOLIC PANEL
Anion gap: 10 (ref 5–15)
BUN: 34 mg/dL — ABNORMAL HIGH (ref 8–23)
CO2: 35 mmol/L — ABNORMAL HIGH (ref 22–32)
Calcium: 8.1 mg/dL — ABNORMAL LOW (ref 8.9–10.3)
Chloride: 96 mmol/L — ABNORMAL LOW (ref 98–111)
Creatinine, Ser: 1 mg/dL (ref 0.61–1.24)
GFR, Estimated: >60 mL/min (ref >60)
Glucose, Bld: 127 mg/dL — ABNORMAL HIGH (ref 70–99)
Potassium: 4.2 mmol/L (ref 3.5–5.1)
Sodium: 141 mmol/L (ref 135–145)

## 2020-08-24 MED ORDER — FUROSEMIDE 10 MG/ML IJ SOLN
40.0000 mg | Freq: Two times a day (BID) | INTRAMUSCULAR | Status: DC
  Administered 2020-08-24 – 2020-08-26 (×6): 40 mg via INTRAVENOUS
  Filled 2020-08-24 (×6): qty 4

## 2020-08-24 MED ORDER — NYSTATIN 100000 UNIT/GM EX POWD
Freq: Two times a day (BID) | CUTANEOUS | Status: DC
  Filled 2020-08-24 (×2): qty 15

## 2020-08-24 MED ORDER — MOMETASONE FURO-FORMOTEROL FUM 200-5 MCG/ACT IN AERO
2.0000 | INHALATION_SPRAY | Freq: Two times a day (BID) | RESPIRATORY_TRACT | Status: DC
  Administered 2020-08-24 – 2020-08-31 (×15): 2 via RESPIRATORY_TRACT
  Filled 2020-08-24: qty 8.8

## 2020-08-24 MED ORDER — IPRATROPIUM BROMIDE 0.02 % IN SOLN
0.5000 mg | Freq: Four times a day (QID) | RESPIRATORY_TRACT | Status: DC
  Administered 2020-08-24 – 2020-08-26 (×6): 0.5 mg via RESPIRATORY_TRACT
  Filled 2020-08-24 (×6): qty 2.5

## 2020-08-24 NOTE — Evaluation (Signed)
Occupational Therapy Evaluation Patient Details Name: Hector Beltran MRN: [id number] DOB: 10-16-1927 Today's Date: 08/24/2020    History of Present Illness 86yo male c/o progressive SOB and BLE edema. Had a fall on 12/8 when trying to stand up from the toilet. Found to be hypoxic in the ED. PE negative. Admitted with acute CHF and acute respiratory failure with hypoxia. PMH gait abnormality, peripheral neuropathy, COPD, nicotene dependence   Clinical Impression   This 86 yo male admitted with above presents to acute OT with PLOF of being independent to Mod I with all basic ADLs, driving, and exercising 4 times a week--living in independent living. He currently is min guard A to Mod A with basic ADLs. He will benefit from acute OT with follow up at SNF to get back to his PLOF and hopefully off O2 before returning home.    Follow Up Recommendations  SNF;Supervision/Assistance - 24 hour    Equipment Recommendations  Other (comment) (3n1)       Precautions / Restrictions Precautions Precautions: Fall Precaution Comments: poor safety awareness with RW( but of not he is used to using a 3 and 4 wheeled walker at home and out and about, which he stands back from more than a standard RW), watch sats (as low as 86% on 3 liters while up and about) Restrictions Weight Bearing Restrictions: No      Mobility Bed Mobility Overal bed mobility: (P) Needs Assistance                  Transfers                 General transfer comment: min guard for safety, cues for hand placement; pt ambulated 45 feet with RW with min guard A back and forth in room with min A to manage O2 tubing    Balance Overall balance assessment: Needs assistance Sitting-balance support: No upper extremity supported;Feet supported Sitting balance-Leahy Scale: Good     Standing balance support: Bilateral upper extremity supported;During functional activity Standing balance-Leahy Scale: Poor Standing balance  comment: reliant on RW, walking to far back from it (however pt used to using a rollator which makes you walk further back from it)                           ADL either performed or assessed with clinical judgement   ADL Overall ADL's : Needs assistance/impaired Eating/Feeding: Independent;Sitting   Grooming: Min guard;Standing   Upper Body Bathing: Set up;Sitting   Lower Body Bathing: Min guard;Sit to/from stand   Upper Body Dressing : Set up;Sitting   Lower Body Dressing: Min guard;Sit to/from stand Lower Body Dressing Details (indicate cue type and reason): crosses one leg over other while seated to get to socks Toilet Transfer: Ambulation;Minimal assistance;RW Toilet Transfer Details (indicate cue type and reason): required min A due to managing O2 tubing Toileting- Clothing Manipulation and Hygiene: Moderate assistance Toileting - Clothing Manipulation Details (indicate cue type and reason): min guard A sit<>stand             Vision Patient Visual Report: No change from baseline              Pertinent Vitals/Pain Pain Assessment: No/denies pain     Hand Dominance Right   Extremity/Trunk Assessment Upper Extremity Assessment Upper Extremity Assessment: Overall WFL for tasks assessed           Communication Communication Communication: No difficulties  Cognition Arousal/Alertness: Awake/alert Behavior During Therapy: WFL for tasks assessed/performed Overall Cognitive Status: Impaired/Different from baseline Area of Impairment: Safety/judgement                         Safety/Judgement: Decreased awareness of safety                    Home Living Family/patient expects to be discharged to:: Skilled nursing facility Living Arrangements: Alone                               Additional Comments: Lives at independent living at [facility]      Prior Functioning/Environment Level of Independence: Independent with  assistive device(s)  Gait / Transfers Assistance Needed: uses a 4 wheeled rollator and a tripod rollator ADL's / Homemaking Assistance Needed: stands in walk in shower to shower using grab bars and hand held shower   Comments: does water aerobics 2-3 times per week (which he drives to) and 1 exercises session "On land" at [facility]        OT Problem List: Decreased activity tolerance;Impaired balance (sitting and/or standing);Cardiopulmonary status limiting activity      OT Treatment/Interventions: Self-care/ADL training;DME and/or AE instruction;Patient/family education;Balance training    OT Goals(Current goals can be found in the care plan section) Acute Rehab OT Goals Patient Stated Goal: to get back to what he normally would be doing OT Goal Formulation: With patient Time For Goal Achievement: 09/07/20 Potential to Achieve Goals: Good  OT Frequency: Min 2X/week   Barriers to D/C: Decreased caregiver support             AM-PAC OT "6 Clicks" Daily Activity     Outcome Measure Help from another person eating meals?: None Help from another person taking care of personal grooming?: A Little Help from another person toileting, which includes using toliet, bedpan, or urinal?: A Lot Help from another person bathing (including washing, rinsing, drying)?: A Little Help from another person to put on and taking off regular upper body clothing?: A Little Help from another person to put on and taking off regular lower body clothing?: A Little 6 Click Score: 18   End of Session Equipment Utilized During Treatment: Gait belt;Rolling walker;Oxygen (3 liters with activity, 2 liters at rest)  Activity Tolerance: Patient tolerated treatment well Patient left: in bed;with call bell/phone within reach;with bed alarm set;with family/visitor present  OT Visit Diagnosis: Other abnormalities of gait and mobility (R26.89);Muscle weakness (generalized) (M62.81)                Time:  2197-5883 OT Time Calculation (min): 46 min Charges:  OT General Charges $OT Visit: 1 Visit OT Evaluation $OT Eval Moderate Complexity: 1 Mod OT Treatments $Self Care/Home Management : 23-37 mins  Hector Beltran, OTR/L Acute [employer] Pager [phone] Office [phone]     Evette Georges 08/24/2020, 3:16 PM

## 2020-08-24 NOTE — Progress Notes (Signed)
PROGRESS NOTE    [name]  SPQ:[id number] DOB: May 06, 1928 DOA: 08/22/2020 PCP: Renford Dills, MD    Brief Narrative:  Hector Beltran is a 86 year old male with past medical history significant for COPD, peripheral neuropathy, chronic diastolic congestive heart failure, nicotine dependence who presented to [facility] ED with complaints of progressive shortness of breath and bilateral lower extremity swelling.  Following Thanksgiving, patient noticed increasing shortness of breath with lower extremity edema; now requiring to sleep in a recliner.  Patient also notes nonproductive cough.  Denies fever/chills, no recent sick contacts, no recent travel and no confirmed contact with individuals positive for Covid-19 viral infection.  In the ED, patient was noted to be hypoxic with oxygen saturations in the low 80s; and placed on supplemental oxygen.  Patient is afebrile without leukocytosis.  CT angiogram chest negative for pulmonary embolism but does note bilateral pleural effusions and cirrhotic appearing liver with multiple cysts and large cyst left upper quadrant measuring 6.3 x 5.1 cm.  BNP elevated 281.  Patient was given IV furosemide 40 mg.  Hospitalist service consulted for further evaluation and management.   Assessment & Plan:   Principal Problem:   Acute CHF (congestive heart failure) (HCC) Active Problems:   Cirrhosis of liver (HCC)   COPD with acute exacerbation (HCC)   Nicotine dependence, cigarettes, uncomplicated   Hyperkalemia   Hepatic cyst   Elevated troponin level not due myocardial infarction   Acute respiratory failure with hypoxia (HCC)   Acute hypoxic respiratory failure Patient presenting to the ED for progressive dyspnea over the last few months.  Patient was found to be hypoxic on presentation with SPO2 in the 80s.  Nonoxygen dependent at baseline.  Etiology likely multifactorial with increased lower extremity edema and elevated BNP consistent with congestive heart  failure exacerbation in conjunction with COPD exacerbation in conjunction with continued tobacco use disorder. --Continuous home oxygen, maintain SPO2 greater than 88% --Continue treatment as below  Acute on chronic diastolic congestive heart failure Patient with worsening lower extremity edema over the past few months.  Elevated BNP. TTE with LVEF 55-60%, grade 1 diastolic dysfunction, RV systolic function mildly reduced. --wt 102.1>99.3kg --Increase furosemide to 40 mg IV BID --Place TED hose --Strict I's and O's and daily weights --Continue to monitor BMP daily while on aggressive IV diuresis  Acute COPD exacerbation Patient with wheezing and hypoxia on presentation.  Continues with tobacco abuse.  Not on oxygen at baseline. --Prednisone 40 mg p.o. daily --Start Dulera 200-5 mcg 2 puffs twice daily --Albuterol nebs every 2 hours as needed for shortness of breath/wheezing --Continue supplemental oxygen, titrate to maintain SPO2 greater than 88%; currently 3 L nasal cannula with SPO2 96%  Hyperkalemia: Resolved Potassium 5.6 on admission, treated with Lokelma 10 mg x 1 on 08/23/2020.[name] --K 4.2 today --Continue monitor on telemetry --Follow electrolytes closely daily  Cirrhosis with multiple hepatic cysts Incidental finding on CT angiogram chest with cirrhotic liver with multiple small hepatic cysts.  Does not follow with gastroenterology. --Consider further outpatient work-up with GI referral versus MR abdomen with liver protocol  Nicotine dependence Counseled on need for complete cessation.  Tinea cruris Patient with redness to groin area, likely secondary to incontinence while on IV diuretics. --Nystatin powder twice daily to groin area  Weakness, deconditioning, gait disturbance: --PT currently recommending SNF   DVT prophylaxis: Lovenox Code Status: DNR Family Communication: No family present at bedside this morning  Disposition Plan:  Status is: Inpatient  Remains  inpatient appropriate  because:Unsafe d/c plan, IV treatments appropriate due to intensity of illness or inability to take PO and Inpatient level of care appropriate due to severity of illness   Dispo:  Patient From: Assisted Living Facility  Planned Disposition: ALF vs SNF  Expected discharge date: 08/26/2020  Medically stable for discharge: No   Consultants:   None  Procedures:   None  Antimicrobials:   None   Subjective: Patient seen and examined at bedside, resting comfortably in bed.  Reports shortness of breath slightly improved but continues with significant dyspnea especially with ambulation.  Continues to require supplemental oxygen.  No other complaints or concerns at this time.  Denies headache, no visual changes, no chest pain, no palpitations, no abdominal pain, no fever/chills/night sweats, no nausea cefonicid/diarrhea.  No acute concerns overnight per nursing staff.  Objective: Vitals:   08/23/20 1621 08/23/20 1923 08/24/20 0051 08/24/20 0552  BP: 115/73  111/79 127/77  Pulse: 65  64 86  Resp: Temp: (!) 97.3 F (36.3 C)  98.4 F (36.9 C) (!) 97.5 F (36.4 C)  TempSrc: Oral  Oral Oral  SpO2: 95% 95% 95% 96%  Weight:    99.3 kg  Height:        Intake/Output Summary (Last 24 hours) at 08/24/2020 1104 Last data filed at 08/24/2020 1104 Gross per 24 hour  Intake 246 ml  Output 1575 ml  Net -1329 ml   Filed Weights   08/22/20 1606 08/24/20 0552  Weight: 102.1 kg 99.3 kg    Examination:  General exam: Appears calm and comfortable, slightly conversationally dyspneic Respiratory system: Decreased breath sounds bilateral bases with crackles, late expiratory wheezing bilaterally, normal respiratory effort without accessory muscle use, on 3 L nasal cannula with SPO2 96% Cardiovascular system: S1 & S2 heard, RRR. No JVD, murmurs, rubs, gallops or clicks.  2+ pitting edema bilateral lower extremities to mid shin with erythema anterior  shins Gastrointestinal system: Abdomen is nondistended, soft and nontender. No organomegaly or masses felt. Normal bowel sounds heard. Central nervous system: Alert and oriented. No focal neurological deficits. Extremities: Symmetric 5 x 5 power. Skin: No rashes, lesions or ulcers Psychiatry: Judgement and insight appear poor. Mood & affect appropriate.     Data Reviewed: I have personally reviewed following labs and imaging studies  CBC: Recent Labs  Lab 08/22/20 1627 08/22/20 1640 08/23/20 0416  WBC 5.3  --  4.0  NEUTROABS 3.8  --  3.5  HGB 15.3 16.7 15.3  HCT 49.1 49.0 48.9  MCV 102.9*  --  101.9*  PLT 138*  --  135*   Basic Metabolic Panel: Recent Labs  Lab 08/22/20 1627 08/22/20 1640 08/23/20 0416 08/24/20 0240  NA 141 140 142 141  K 5.3* 5.1 5.6* 4.2  CL 101 101 98 96*  CO2 30  --  33* 35*  GLUCOSE 98 95 152* 127*  BUN 28* 33* 29* 34*  CREATININE 1.08 1.00 1.22 1.00  CALCIUM 8.5*  --  8.5* 8.1*  MG  --   --  2.1  --    GFR: Estimated Creatinine Clearance: 60.3 mL/min (by C-G formula based on SCr of 1 mg/dL). Liver Function Tests: Recent Labs  Lab 08/22/20 1627  AST 26  ALT 19  ALKPHOS 62  BILITOT 1.1  PROT 6.6  ALBUMIN 3.3*   No results for input(s): LIPASE, AMYLASE in the last 168 hours. No results for input(s): AMMONIA in the last 168 hours. Coagulation Profile: Recent Labs  Lab 08/23/20 0416  INR 1.1   Cardiac Enzymes: No results for input(s): CKTOTAL, CKMB, CKMBINDEX, TROPONINI in the last 168 hours. BNP (last 3 results) No results for input(s): PROBNP in the last 8760 hours. HbA1C: No results for input(s): HGBA1C in the last 72 hours. CBG: No results for input(s): GLUCAP in the last 168 hours. Lipid Profile: No results for input(s): CHOL, HDL, LDLCALC, TRIG, CHOLHDL, LDLDIRECT in the last 72 hours. Thyroid Function Tests: No results for input(s): TSH, T4TOTAL, FREET4, T3FREE, THYROIDAB in the last 72 hours. Anemia Panel: Recent  Labs    08/22/20 2133  VITAMINB12 707  FOLATE 36.7   Sepsis Labs: No results for input(s): PROCALCITON, LATICACIDVEN in the last 168 hours.  Recent Results (from the past 240 hour(s))  Resp Panel by RT-PCR (Flu A&B, Covid) Nasopharyngeal Swab     Status: None   Collection Time: 08/22/20  4:27 PM   Specimen: Nasopharyngeal Swab; Nasopharyngeal(NP) swabs in vial transport medium  Result Value Ref Range Status   SARS Coronavirus 2 by RT PCR NEGATIVE NEGATIVE Final    Comment: (NOTE) SARS-CoV-2 target nucleic acids are NOT DETECTED.  The SARS-CoV-2 RNA is generally detectable in upper respiratory specimens during the acute phase of infection. The lowest concentration of SARS-CoV-2 viral copies this assay can detect is 138 copies/mL. A negative result does not preclude SARS-Cov-2 infection and should not be used as the sole basis for treatment or other patient management decisions. A negative result may occur with  improper specimen collection/handling, submission of specimen other than nasopharyngeal swab, presence of viral mutation(s) within the areas targeted by this assay, and inadequate number of viral copies(<138 copies/mL). A negative result must be combined with clinical observations, patient history, and epidemiological information. The expected result is Negative.  Fact Sheet for Patients:  BloggerCourse.com  Fact Sheet for Healthcare Providers:  SeriousBroker.it  This test is no t yet approved or cleared by the [place] FDA and  has been authorized for detection and/or diagnosis of SARS-CoV-2 by FDA under an Emergency Use Authorization (EUA). This EUA will remain  in effect (meaning this test can be used) for the duration of the COVID-19 declaration under Section 564(b)(1) of the Act, 21 U.S.C.section 360bbb-3(b)(1), unless the authorization is terminated  or revoked sooner.       Influenza A by PCR NEGATIVE  NEGATIVE Final   Influenza B by PCR NEGATIVE NEGATIVE Final    Comment: (NOTE) The Xpert Xpress SARS-CoV-2/FLU/RSV plus assay is intended as an aid in the diagnosis of influenza from Nasopharyngeal swab specimens and should not be used as a sole basis for treatment. Nasal washings and aspirates are unacceptable for Xpert Xpress SARS-CoV-2/FLU/RSV testing.  Fact Sheet for Patients: BloggerCourse.com  Fact Sheet for Healthcare Providers: SeriousBroker.it  This test is not yet approved or cleared by the [place] FDA and has been authorized for detection and/or diagnosis of SARS-CoV-2 by FDA under an Emergency Use Authorization (EUA). This EUA will remain in effect (meaning this test can be used) for the duration of the COVID-19 declaration under Section 564(b)(1) of the Act, 21 U.S.C. section 360bbb-3(b)(1), unless the authorization is terminated or revoked.  Performed at [facility], [address], [place], [place] [address]          Radiology Studies: CT Angio Chest PE W and/or Wo Contrast  Result Date: 08/22/2020 CLINICAL DATA:  Shortness of breath. EXAM: CT ANGIOGRAPHY CHEST WITH CONTRAST TECHNIQUE: Multidetector CT imaging of the chest was  performed using the standard protocol during bolus administration of intravenous contrast. Multiplanar CT image reconstructions and MIPs were obtained to evaluate the vascular anatomy. CONTRAST:  60mL OMNIPAQUE IOHEXOL 350 MG/ML SOLN COMPARISON:  None. FINDINGS: Cardiovascular: There is mild calcification of the aortic arch. Satisfactory opacification of the pulmonary arteries to the segmental level. No evidence of pulmonary embolism. There is moderate severity cardiomegaly with moderate to marked severity coronary artery calcification. No pericardial effusion. Mediastinum/Nodes: No enlarged mediastinal, hilar, or axillary lymph nodes. Thyroid gland, trachea, and esophagus  demonstrate no significant findings. Lungs/Pleura: Mild biapical scarring and/or atelectasis is seen. Mild areas of atelectasis and/or infiltrate are noted within the bilateral lung bases. There is a small right pleural effusion with small partially loculated components. A very small left pleural effusion is also seen. No pneumothorax is identified. Upper Abdomen: The liver is cirrhotic in appearance. A 1.5 cm x 1.3 cm cystic appearing area is seen within the left lobe of the liver. Similar appearing 1.3 cm x 0.6 cm and 1.3 cm x 1.2 cm areas of low attenuation are seen within the right lobe. A mild to moderate amount of perihepatic and perisplenic fluid is noted. A 6.3 cm x 5.1 cm cyst is seen within the left upper quadrant (incompletely imaged). Musculoskeletal: Marked severity multilevel degenerative changes seen throughout the thoracic spine. Review of the MIP images confirms the above findings. IMPRESSION: 1. No evidence of pulmonary embolism. 2. Mild bibasilar atelectasis and/or infiltrate. 3. Small bilateral pleural effusions with partially loculated components on the right. 4. Moderate severity cardiomegaly with moderate to marked severity coronary artery calcification. 5. Cirrhotic liver with multiple small hepatic cysts and/or hemangiomas. Correlation with MRI is recommended. 6. 6.3 cm x 5.1 cm cyst within the left upper quadrant (incompletely imaged). Further correlation with nonemergent MRI of the abdomen is recommended to further determine its origin. 7. Aortic atherosclerosis. Aortic Atherosclerosis (ICD10-I70.0). Electronically Signed   By: Aram Candelahaddeus  Houston M.D.   On: 08/22/2020 18:08   DG Chest Port 1 View  Result Date: 08/22/2020 CLINICAL DATA:  Shortness of breath. EXAM: PORTABLE CHEST 1 VIEW COMPARISON:  08/21/2020. FINDINGS: Small right pleural effusion. Mild diffuse interstitial prominence. Right basilar opacities. No visible pneumothorax. Mildly enlarged cardiac silhouette. No acute osseous  abnormality. IMPRESSION: 1. Mild diffuse interstitial prominence, which may represent mild interstitial edema. 2. Small right pleural effusion with overlying right basilar opacities, which could represent atelectasis, aspiration, and/or pneumonia. Electronically Signed   By: Feliberto HartsFrederick S Jones MD   On: 08/22/2020 16:41   ECHOCARDIOGRAM COMPLETE  Result Date: 08/23/2020    ECHOCARDIOGRAM REPORT   Patient Name:   Hector Beltran Date of Exam: 08/23/2020 Medical Rec #:  [id number]     Height:       76.0 in Accession #:    [id number]    Weight:       225.0 lb Date of Birth:  Apr 20, 1928     BSA:          2.329 m Patient Age:    89 years      BP:           142/74 mmHg Patient Gender: M             HR:           80 bpm. Exam Location:  Inpatient Procedure: 2D Echo Indications:    CHF- Acute Systolic I50.21  History:        Patient has no prior history of Echocardiogram examinations.  Sonographer:    Thurman Coyer RDCS (AE) Referring Phys: [id number] Deno Lunger [facility] IMPRESSIONS  1. Poor acoustic windows limit study.  2. Left ventricular ejection fraction, by estimation, is 55 to 60%. The left ventricle has normal function. Left ventricular endocardial border not optimally defined to evaluate regional wall motion. Left ventricular diastolic parameters are consistent with Grade I diastolic dysfunction (impaired relaxation).  3. Right ventricular systolic function is mildly reduced. The right ventricular size is mildly enlarged. There is mildly elevated pulmonary artery systolic pressure.  4. Right atrial size was mildly dilated.  5. The mitral valve is normal in structure. Mild mitral valve regurgitation.  6. The aortic valve is abnormal. Aortic valve regurgitation is not visualized. Mild aortic valve sclerosis is present, with no evidence of aortic valve stenosis. FINDINGS  Left Ventricle: Left ventricular ejection fraction, by estimation, is 55 to 60%. The left ventricle has normal function. Left ventricular endocardial  border not optimally defined to evaluate regional wall motion. The left ventricular internal cavity size was normal in size. There is no left ventricular hypertrophy. Left ventricular diastolic parameters are consistent with Grade I diastolic dysfunction (impaired relaxation). Right Ventricle: The right ventricular size is mildly enlarged. Right vetricular wall thickness was not assessed. Right ventricular systolic function is mildly reduced. There is mildly elevated pulmonary artery systolic pressure. The tricuspid regurgitant velocity is 2.53 m/s, and with an assumed right atrial pressure of 15 mmHg, the estimated right ventricular systolic pressure is 40.6 mmHg. Left Atrium: Left atrial size was normal in size. Right Atrium: Right atrial size was mildly dilated. Pericardium: Trivial pericardial effusion is present. Mitral Valve: The mitral valve is normal in structure. Mild mitral valve regurgitation. Tricuspid Valve: The tricuspid valve is normal in structure. Tricuspid valve regurgitation is mild. Aortic Valve: The aortic valve is abnormal. Aortic valve regurgitation is not visualized. Mild aortic valve sclerosis is present, with no evidence of aortic valve stenosis. Pulmonic Valve: The pulmonic valve was normal in structure. Pulmonic valve regurgitation is not visualized. Aorta: The aortic root is normal in size and structure. IAS/Shunts: The interatrial septum was not assessed.  LEFT VENTRICLE PLAX 2D LVIDd:         4.80 cm  Diastology LVIDs:         4.20 cm  LV e' medial:    6.85 cm/s LV PW:         0.90 cm  LV E/e' medial:  13.3 LV IVS:        0.90 cm  LV e' lateral:   9.14 cm/s LVOT diam:     2.20 cm  LV E/e' lateral: 9.9 LV SV:         78 LV SV Index:   33 LVOT Area:     3.80 cm  RIGHT VENTRICLE RV S prime:     11.00 cm/s TAPSE (M-mode): 2.0 cm LEFT ATRIUM           Index       RIGHT ATRIUM           Index LA diam:      2.80 cm 1.20 cm/m  RA Area:     25.30 cm LA Vol (A2C): 77.3 ml 33.19 ml/m RA  Volume:   78.60 ml  33.75 ml/m LA Vol (A4C): 25.7 ml 11.04 ml/m  AORTIC VALVE LVOT Vmax:   85.80 cm/s LVOT Vmean:  55.900 cm/s LVOT VTI:    0.204 m  AORTA Ao Root diam: 4.00 cm MITRAL VALVE  TRICUSPID VALVE MV Area (PHT): 4.80 cm     TR Peak grad:   25.6 mmHg MV Decel Time: 158 msec     TR Vmax:        253.00 cm/s MV E velocity: 90.80 cm/s MV A velocity: 113.00 cm/s  SHUNTS MV E/A ratio:  0.80         Systemic VTI:  0.20 m                             Systemic Diam: 2.20 cm Dietrich Pates MD Electronically signed by Dietrich Pates MD Signature Date/Time: 08/23/2020/9:12:13 PM    Final         Scheduled Meds: . enoxaparin (LOVENOX) injection  40 mg Subcutaneous Q24H  . feeding supplement  237 mL Oral BID BM  . furosemide  40 mg Intravenous BID  . mometasone-formoterol  2 puff Inhalation BID  . nystatin   Topical BID  . predniSONE  40 mg Oral Q breakfast  . sodium chloride flush  3 mL Intravenous Q12H   Continuous Infusions: . sodium chloride       LOS: 1 day    Time spent: 37 minutes spent on chart review, discussion with nursing staff, consultants, updating family and interview/physical exam; more than 50% of that time was spent in counseling and/or coordination of care.    Alvira Philips [place], DO [facility] Available via Epic secure chat 7am-7pm After these hours, please refer to coverage provider listed on [email or web address] 08/24/2020, 11:04 AM

## 2020-08-24 NOTE — Progress Notes (Signed)
SATURATION QUALIFICATIONS: (This note is used to comply with regulatory documentation for home oxygen)  Patient Saturations on Room Air at Rest = 92%  Patient Saturations on Room Air while Ambulating = 80%  Patient Saturations on 2 Liters of oxygen while Ambulating = 88%  Please briefly explain why patient needs home oxygen: Pt's O2 Saturation dropped while ambulating without O2.

## 2020-08-25 LAB — BASIC METABOLIC PANEL
Anion gap: 8 (ref 5–15)
BUN: 30 mg/dL — ABNORMAL HIGH (ref 8–23)
CO2: 41 mmol/L — ABNORMAL HIGH (ref 22–32)
Calcium: 8.4 mg/dL — ABNORMAL LOW (ref 8.9–10.3)
Chloride: 92 mmol/L — ABNORMAL LOW (ref 98–111)
Creatinine, Ser: 0.91 mg/dL (ref 0.61–1.24)
GFR, Estimated: >60 mL/min (ref >60)
Glucose, Bld: 133 mg/dL — ABNORMAL HIGH (ref 70–99)
Potassium: 3.9 mmol/L (ref 3.5–5.1)
Sodium: 141 mmol/L (ref 135–145)

## 2020-08-25 NOTE — TOC Initial Note (Signed)
Transition of Care [facility]) - Initial/Assessment Note    Patient Details  Name: Hector Beltran MRN: [id number] Date of Birth: October 07, 1927  [facility] CM/SW Contact:    Oretha Milch, LCSW Phone Number: 08/25/2020, 10:38 AM  Clinical Narrative: CSW met with patient to discuss recommendation for SNF placement. Patient reported he receives physical therapy at [facility] and would like to return if he cannot go to CIR. CSW informed him the recommendation does not suggest CIR. CSW noted patient was open to SNF but requested CSW to reach out to [facility] first to seek what support they can provide. CSW will continue to follow.               Expected Discharge Plan: Assisted Living Barriers to Discharge: Continued Medical Work up   Patient Goals and CMS Choice Patient states their goals for this hospitalization and ongoing recovery are:: "I really want to be able to go back to my room."      Expected Discharge Plan and Services Expected Discharge Plan: Assisted Living In-house Referral: Clinical Social Work     Living arrangements for the past 2 months: [place]                                      Prior Living Arrangements/Services Living arrangements for the past 2 months: [place] Lives with:: Facility Resident Patient language and need for interpreter reviewed:: Yes Do you feel safe going back to the place where you live?: Yes      Need for Family Participation in Patient Care: No (Comment) Care giver support system in place?: No (comment) Current home services: Home PT,Home OT Criminal Activity/Legal Involvement Pertinent to Current Situation/Hospitalization: No - Comment as needed  Activities of Daily Living Home Assistive Devices/Equipment: Environmental consultant (specify type),Eyeglasses ADL Screening (condition at time of admission) Patient's cognitive ability adequate to safely complete daily activities?: Yes Is the patient deaf or  have difficulty hearing?: No Does the patient have difficulty seeing, even when wearing glasses/contacts?: No Does the patient have difficulty concentrating, remembering, or making decisions?: No Patient able to express need for assistance with ADLs?: Yes Does the patient have difficulty dressing or bathing?: No Independently performs ADLs?: Yes (appropriate for developmental age) Does the patient have difficulty walking or climbing stairs?: Yes Weakness of Legs: Both Weakness of Arms/Hands: None  Permission Sought/Granted Permission sought to share information with : Chartered certified accountant granted to share information with : Yes, Verbal Permission Granted              Emotional Assessment Appearance:: Appears older than stated age Attitude/Demeanor/Rapport: Charismatic Affect (typically observed): Appropriate Orientation: : Oriented to Situation,Oriented to  Time,Oriented to Place,Oriented to Self Alcohol / Substance Use: Not Applicable Psych Involvement: No (comment)  Admission diagnosis:  Pleural effusion [J90] Bronchitis [J40] Hypoxia [R09.02] Acute CHF (congestive heart failure) (HCC) [I50.9] Congestive heart failure, unspecified HF chronicity, unspecified heart failure type ([place]) [I50.9] Patient Active Problem List   Diagnosis Date Noted  . Acute CHF (congestive heart failure) ([place]) 08/22/2020  . Cirrhosis of liver ([place]) 08/22/2020  . COPD with acute exacerbation ([place]) 08/22/2020  . Nicotine dependence, cigarettes, uncomplicated 85/63/1497  . Hyperkalemia 08/22/2020  . Hepatic cyst 08/22/2020  . Elevated troponin level not due myocardial infarction 08/22/2020  . Acute respiratory failure with hypoxia ([place]) 08/22/2020  . Gait abnormality 02/09/2020  . Norovirus  11/22/2018  . Diarrhea of presumed infectious origin 11/21/2018  . Peripheral neuropathy 09/30/2017   PCP:  Seward Carol, MD Pharmacy:   [id number] [place] [address] [place]Phone: [phone]: [phone]    Social Determinants of Health (SDOH) Interventions    Readmission Risk Interventions No flowsheet data found.

## 2020-08-25 NOTE — Progress Notes (Signed)
PROGRESS NOTE    Hector Beltran  KGM:[id number] DOB: [birth year] DOA: 08/22/2020 PCP: Renford Dills, MD    Brief Narrative:  Hector Beltran is a 86 year old male with past medical history significant for COPD, peripheral neuropathy, chronic diastolic congestive heart failure, nicotine dependence who presented to [facility] ED with complaints of progressive shortness of breath and bilateral lower extremity swelling.  Following Thanksgiving, patient noticed increasing shortness of breath with lower extremity edema; now requiring to sleep in a recliner.  Patient also notes nonproductive cough.  Denies fever/chills, no recent sick contacts, no recent travel and no confirmed contact with individuals positive for Covid-19 viral infection.  In the ED, patient was noted to be hypoxic with oxygen saturations in the low 80s; and placed on supplemental oxygen.  Patient is afebrile without leukocytosis.  CT angiogram chest negative for pulmonary embolism but does note bilateral pleural effusions and cirrhotic appearing liver with multiple cysts and large cyst left upper quadrant measuring 6.3 x 5.1 cm.  BNP elevated 281.  Patient was given IV furosemide 40 mg.  Hospitalist service consulted for further evaluation and management.   Assessment & Plan:   Principal Problem:   Acute CHF (congestive heart failure) (HCC) Active Problems:   Cirrhosis of liver (HCC)   COPD with acute exacerbation (HCC)   Nicotine dependence, cigarettes, uncomplicated   Hyperkalemia   Hepatic cyst   Elevated troponin level not due myocardial infarction   Acute respiratory failure with hypoxia (HCC)   Acute hypoxic respiratory failure Patient presenting to the ED for progressive dyspnea over the last few months.  Patient was found to be hypoxic on presentation with SPO2 in the 80s.  Nonoxygen dependent at baseline.  Etiology likely multifactorial with increased lower extremity edema and elevated BNP consistent with congestive heart  failure exacerbation in conjunction with COPD exacerbation in conjunction with continued tobacco use disorder. --Continue supplemental oxygen, maintain SPO2 greater than 88% --Continue treatment as below  Acute on chronic diastolic congestive heart failure Patient with worsening lower extremity edema over the past few months.  Elevated BNP. TTE with LVEF 55-60%, grade 1 diastolic dysfunction, RV systolic function mildly reduced. --wt 102.1>99.3>95.5kg --net negative 3.2L past 24h and net negative 3.8L since admission --Furosemide 40 mg IV BID --TED hose --Strict I's and O's and daily weights --Continue to monitor BMP daily while on aggressive IV diuresis  Acute COPD exacerbation Patient with wheezing and hypoxia on presentation.  Continues with tobacco abuse.  Not on oxygen at baseline. --Prednisone 40 mg p.o. daily x 5 days --Started Dulera 200-5 mcg 2 puffs twice daily --Albuterol nebs every 2 hours as needed for shortness of breath/wheezing --Continue supplemental oxygen, titrate to maintain SPO2 greater than 88%; currently 3 L nasal cannula with SPO2 96%  Hyperkalemia: Resolved Potassium 5.6 on admission, treated with Lokelma 10 mg x 1 on 08/23/2020.[name] --K 3.9 today --Continue monitor on telemetry --Follow electrolytes closely daily  Cirrhosis with multiple hepatic cysts Incidental finding on CT angiogram chest with cirrhotic liver with multiple small hepatic cysts.  Does not follow with gastroenterology. --Consider further outpatient work-up with GI referral versus MR abdomen with liver protocol  Nicotine dependence Counseled on need for complete cessation.  Tinea cruris Patient with redness to groin area, likely secondary to incontinence while on IV diuretics. --Nystatin powder twice daily to groin area  Weakness, deconditioning, gait disturbance: --PT currently recommending SNF; patient seems adamant of returning back to ALF --Continue therapy efforts while  inpatient   DVT prophylaxis:  Lovenox Code Status: DNR Family Communication: No family present at bedside this morning; updated patient's son via telephone this afternoon  Disposition Plan:  Status is: Inpatient  Remains inpatient appropriate because:Unsafe d/c plan, IV treatments appropriate due to intensity of illness or inability to take PO and Inpatient level of care appropriate due to severity of illness   Dispo:  Patient From: Assisted Living Facility  Planned Disposition: Skilled Nursing Facility vs ALF  Expected discharge date: 08/27/2020  Medically stable for discharge: No   Consultants:   None  Procedures:   None  Antimicrobials:   None   Subjective: Patient seen and examined at bedside, resting comfortably in bed.  With mild shortness of breath, although improved since admission.  Lower extremity edema also improved.  Continues on 2 L nasal cannula.  Significant desaturation yesterday while ambulating on room air to 80%, maintained adequate oxygenation on 2 L nasal,.  No family present at bedside this morning.  No other questions or concerns at this time. Denies headache, no visual changes, no chest pain, no palpitations, no abdominal pain, no fever/chills/night sweats, no nausea cefonicid/diarrhea.  No acute concerns overnight per nursing staff.  Objective: Vitals:   08/24/20 2214 08/25/20 0008 08/25/20 0546 08/25/20 1217  BP:  122/62 124/73 126/65  Pulse:  94 74 98  Resp:  Temp:  98.4 F (36.9 C) 97.8 F (36.6 C) 97.8 F (36.6 C)  TempSrc:  Oral Oral Oral  SpO2: 92% 92% 94% 98%  Weight:   95.5 kg   Height:        Intake/Output Summary (Last 24 hours) at 08/25/2020 1314 Last data filed at 08/25/2020 1018 Gross per 24 hour  Intake 480 ml  Output 3900 ml  Net -3420 ml   Filed Weights   08/22/20 1606 08/24/20 0552 08/25/20 0546  Weight: 102.1 kg 99.3 kg 95.5 kg    Examination:  General exam: Appears calm and comfortable, slightly  conversationally dyspneic Respiratory system: Decreased breath sounds bilateral bases with crackles, late expiratory wheezing bilaterally, normal respiratory effort without accessory muscle use, on 2 L nasal cannula with SPO2 96% Cardiovascular system: S1 & S2 heard, RRR. No JVD, murmurs, rubs, gallops or clicks.  1+ pitting edema bilateral lower extremities to mid shin  Gastrointestinal system: Abdomen is nondistended, soft and nontender. No organomegaly or masses felt. Normal bowel sounds heard. Central nervous system: Alert and oriented. No focal neurological deficits. Extremities: Symmetric 5 x 5 power. Skin: No rashes, lesions or ulcers Psychiatry: Judgement and insight appear poor. Mood & affect appropriate.     Data Reviewed: I have personally reviewed following labs and imaging studies  CBC: Recent Labs  Lab 08/22/20 1627 08/22/20 1640 08/23/20 0416  WBC 5.3  --  4.0  NEUTROABS 3.8  --  3.5  HGB 15.3 16.7 15.3  HCT 49.1 49.0 48.9  MCV 102.9*  --  101.9*  PLT 138*  --  135*   Basic Metabolic Panel: Recent Labs  Lab 08/22/20 1627 08/22/20 1640 08/23/20 0416 08/24/20 0240 08/25/20 0216  NA 141 140 142 141 141  K 5.3* 5.1 5.6* 4.2 3.9  CL 101 101 98 96* 92*  CO2 30  --  33* 35* 41*  GLUCOSE 98 95 152* 127* 133*  BUN 28* 33* 29* 34* 30*  CREATININE 1.08 1.00 1.22 1.00 0.91  CALCIUM 8.5*  --  8.5* 8.1* 8.4*  MG  --   --  2.1  --   --  GFR: Estimated Creatinine Clearance: 66.2 mL/min (by C-G formula based on SCr of 0.91 mg/dL). Liver Function Tests: Recent Labs  Lab 08/22/20 1627  AST 26  ALT 19  ALKPHOS 62  BILITOT 1.1  PROT 6.6  ALBUMIN 3.3*   No results for input(s): LIPASE, AMYLASE in the last 168 hours. No results for input(s): AMMONIA in the last 168 hours. Coagulation Profile: Recent Labs  Lab 08/23/20 0416  INR 1.1   Cardiac Enzymes: No results for input(s): CKTOTAL, CKMB, CKMBINDEX, TROPONINI in the last 168 hours. BNP (last 3  results) No results for input(s): PROBNP in the last 8760 hours. HbA1C: No results for input(s): HGBA1C in the last 72 hours. CBG: No results for input(s): GLUCAP in the last 168 hours. Lipid Profile: No results for input(s): CHOL, HDL, LDLCALC, TRIG, CHOLHDL, LDLDIRECT in the last 72 hours. Thyroid Function Tests: No results for input(s): TSH, T4TOTAL, FREET4, T3FREE, THYROIDAB in the last 72 hours. Anemia Panel: Recent Labs    08/22/20 2133  VITAMINB12 707  FOLATE 36.7   Sepsis Labs: No results for input(s): PROCALCITON, LATICACIDVEN in the last 168 hours.  Recent Results (from the past 240 hour(s))  Resp Panel by RT-PCR (Flu A&B, Covid) Nasopharyngeal Swab     Status: None   Collection Time: 08/22/20  4:27 PM   Specimen: Nasopharyngeal Swab; Nasopharyngeal(NP) swabs in vial transport medium  Result Value Ref Range Status   SARS Coronavirus 2 by RT PCR NEGATIVE NEGATIVE Final    Comment: (NOTE) SARS-CoV-2 target nucleic acids are NOT DETECTED.  The SARS-CoV-2 RNA is generally detectable in upper respiratory specimens during the acute phase of infection. The lowest concentration of SARS-CoV-2 viral copies this assay can detect is 138 copies/mL. A negative result does not preclude SARS-Cov-2 infection and should not be used as the sole basis for treatment or other patient management decisions. A negative result may occur with  improper specimen collection/handling, submission of specimen other than nasopharyngeal swab, presence of viral mutation(s) within the areas targeted by this assay, and inadequate number of viral copies(<138 copies/mL). A negative result must be combined with clinical observations, patient history, and epidemiological information. The expected result is Negative.  Fact Sheet for Patients:  BloggerCourse.comhttps://www.fda.gov/media/152166/download  Fact Sheet for Healthcare Providers:  SeriousBroker.ithttps://www.fda.gov/media/152162/download  This test is no t yet approved or  cleared by the [place] FDA and  has been authorized for detection and/or diagnosis of SARS-CoV-2 by FDA under an Emergency Use Authorization (EUA). This EUA will remain  in effect (meaning this test can be used) for the duration of the COVID-19 declaration under Section 564(b)(1) of the Act, 21 U.S.C.section 360bbb-3(b)(1), unless the authorization is terminated  or revoked sooner.       Influenza A by PCR NEGATIVE NEGATIVE Final   Influenza B by PCR NEGATIVE NEGATIVE Final    Comment: (NOTE) The Xpert Xpress SARS-CoV-2/FLU/RSV plus assay is intended as an aid in the diagnosis of influenza from Nasopharyngeal swab specimens and should not be used as a sole basis for treatment. Nasal washings and aspirates are unacceptable for Xpert Xpress SARS-CoV-2/FLU/RSV testing.  Fact Sheet for Patients: BloggerCourse.comhttps://www.fda.gov/media/152166/download  Fact Sheet for Healthcare Providers: SeriousBroker.ithttps://www.fda.gov/media/152162/download  This test is not yet approved or cleared by the [place] FDA and has been authorized for detection and/or diagnosis of SARS-CoV-2 by FDA under an Emergency Use Authorization (EUA). This EUA will remain in effect (meaning this test can be used) for the duration of the COVID-19 declaration under Section 564(b)(1) of  the Act, 21 U.S.C. section 360bbb-3(b)(1), unless the authorization is terminated or revoked.  Performed at [facility], [address] [place], [place] [address]          Radiology Studies: ECHOCARDIOGRAM COMPLETE  Result Date: 08/23/2020    ECHOCARDIOGRAM REPORT   Patient Name:   JEREMEY BASCOM Date of Exam: 08/23/2020 Medical Rec #:  [id number]     Height:       76.0 in Accession #:    [id number]    Weight:       225.0 lb Date of Birth:  09-Feb-1928     BSA:          2.329 m Patient Age:    89 years      BP:           142/74 mmHg Patient Gender: M             HR:           80 bpm. Exam Location:  Inpatient Procedure: 2D Echo  Indications:    CHF- Acute Systolic I50.21  History:        Patient has no prior history of Echocardiogram examinations.  Sonographer:    Thurman Coyer RDCS (AE) Referring Phys: [id number] [facility] IMPRESSIONS  1. Poor acoustic windows limit study.  2. Left ventricular ejection fraction, by estimation, is 55 to 60%. The left ventricle has normal function. Left ventricular endocardial border not optimally defined to evaluate regional wall motion. Left ventricular diastolic parameters are consistent with Grade I diastolic dysfunction (impaired relaxation).  3. Right ventricular systolic function is mildly reduced. The right ventricular size is mildly enlarged. There is mildly elevated pulmonary artery systolic pressure.  4. Right atrial size was mildly dilated.  5. The mitral valve is normal in structure. Mild mitral valve regurgitation.  6. The aortic valve is abnormal. Aortic valve regurgitation is not visualized. Mild aortic valve sclerosis is present, with no evidence of aortic valve stenosis. FINDINGS  Left Ventricle: Left ventricular ejection fraction, by estimation, is 55 to 60%. The left ventricle has normal function. Left ventricular endocardial border not optimally defined to evaluate regional wall motion. The left ventricular internal cavity size was normal in size. There is no left ventricular hypertrophy. Left ventricular diastolic parameters are consistent with Grade I diastolic dysfunction (impaired relaxation). Right Ventricle: The right ventricular size is mildly enlarged. Right vetricular wall thickness was not assessed. Right ventricular systolic function is mildly reduced. There is mildly elevated pulmonary artery systolic pressure. The tricuspid regurgitant velocity is 2.53 m/s, and with an assumed right atrial pressure of 15 mmHg, the estimated right ventricular systolic pressure is 40.6 mmHg. Left Atrium: Left atrial size was normal in size. Right Atrium: Right atrial size was mildly  dilated. Pericardium: Trivial pericardial effusion is present. Mitral Valve: The mitral valve is normal in structure. Mild mitral valve regurgitation. Tricuspid Valve: The tricuspid valve is normal in structure. Tricuspid valve regurgitation is mild. Aortic Valve: The aortic valve is abnormal. Aortic valve regurgitation is not visualized. Mild aortic valve sclerosis is present, with no evidence of aortic valve stenosis. Pulmonic Valve: The pulmonic valve was normal in structure. Pulmonic valve regurgitation is not visualized. Aorta: The aortic root is normal in size and structure. IAS/Shunts: The interatrial septum was not assessed.  LEFT VENTRICLE PLAX 2D LVIDd:         4.80 cm  Diastology LVIDs:         4.20 cm  LV  e' medial:    6.85 cm/s LV PW:         0.90 cm  LV E/e' medial:  13.3 LV IVS:        0.90 cm  LV e' lateral:   9.14 cm/s LVOT diam:     2.20 cm  LV E/e' lateral: 9.9 LV SV:         78 LV SV Index:   33 LVOT Area:     3.80 cm  RIGHT VENTRICLE RV S prime:     11.00 cm/s TAPSE (M-mode): 2.0 cm LEFT ATRIUM           Index       RIGHT ATRIUM           Index LA diam:      2.80 cm 1.20 cm/m  RA Area:     25.30 cm LA Vol (A2C): 77.3 ml 33.19 ml/m RA Volume:   78.60 ml  33.75 ml/m LA Vol (A4C): 25.7 ml 11.04 ml/m  AORTIC VALVE LVOT Vmax:   85.80 cm/s LVOT Vmean:  55.900 cm/s LVOT VTI:    0.204 m  AORTA Ao Root diam: 4.00 cm MITRAL VALVE                TRICUSPID VALVE MV Area (PHT): 4.80 cm     TR Peak grad:   25.6 mmHg MV Decel Time: 158 msec     TR Vmax:        253.00 cm/s MV E velocity: 90.80 cm/s MV A velocity: 113.00 cm/s  SHUNTS MV E/A ratio:  0.80         Systemic VTI:  0.20 m                             Systemic Diam: 2.20 cm Dietrich Pates MD Electronically signed by Dietrich Pates MD Signature Date/Time: 08/23/2020/9:12:13 PM    Final         Scheduled Meds: . enoxaparin (LOVENOX) injection  40 mg Subcutaneous Q24H  . feeding supplement  237 mL Oral BID BM  . furosemide  40 mg Intravenous BID   . ipratropium  0.5 mg Nebulization Q6H  . mometasone-formoterol  2 puff Inhalation BID  . nystatin   Topical BID  . predniSONE  40 mg Oral Q breakfast  . sodium chloride flush  3 mL Intravenous Q12H   Continuous Infusions: . sodium chloride       LOS: 2 days    Time spent: 36 minutes spent on chart review, discussion with nursing staff, consultants, updating family and interview/physical exam; more than 50% of that time was spent in counseling and/or coordination of care.    Alvira Philips [place], DO [facility] Available via Epic secure chat 7am-7pm After these hours, please refer to coverage provider listed on [email or web address] 08/25/2020, 1:14 PM

## 2020-08-26 LAB — BASIC METABOLIC PANEL
Anion gap: 10 (ref 5–15)
BUN: 22 mg/dL (ref 8–23)
CO2: 38 mmol/L — ABNORMAL HIGH (ref 22–32)
Calcium: 8.5 mg/dL — ABNORMAL LOW (ref 8.9–10.3)
Chloride: 90 mmol/L — ABNORMAL LOW (ref 98–111)
Creatinine, Ser: 0.87 mg/dL (ref 0.61–1.24)
GFR, Estimated: >60 mL/min (ref >60)
Glucose, Bld: 83 mg/dL (ref 70–99)
Potassium: 3.8 mmol/L (ref 3.5–5.1)
Sodium: 138 mmol/L (ref 135–145)

## 2020-08-26 NOTE — Progress Notes (Signed)
PROGRESS NOTE    Hector Beltran  JGG:[id number] DOB: 1928-03-18 DOA: 08/22/2020 PCP: Renford Dills, MD    Brief Narrative:  Hector Beltran is a 86 year old male with past medical history significant for COPD, peripheral neuropathy, chronic diastolic congestive heart failure, nicotine dependence who presented to [facility] ED with complaints of progressive shortness of breath and bilateral lower extremity swelling.  Following Thanksgiving, patient noticed increasing shortness of breath with lower extremity edema; now requiring to sleep in a recliner.  Patient also notes nonproductive cough.  Denies fever/chills, no recent sick contacts, no recent travel and no confirmed contact with individuals positive for Covid-19 viral infection.  In the ED, patient was noted to be hypoxic with oxygen saturations in the low 80s; and placed on supplemental oxygen.  Patient is afebrile without leukocytosis.  CT angiogram chest negative for pulmonary embolism but does note bilateral pleural effusions and cirrhotic appearing liver with multiple cysts and large cyst left upper quadrant measuring 6.3 x 5.1 cm.  BNP elevated 281.  Patient was given IV furosemide 40 mg.  Hospitalist service consulted for further evaluation and management.   Assessment & Plan:   Principal Problem:   Acute CHF (congestive heart failure) (HCC) Active Problems:   Cirrhosis of liver (HCC)   COPD with acute exacerbation (HCC)   Nicotine dependence, cigarettes, uncomplicated   Hyperkalemia   Hepatic cyst   Elevated troponin level not due myocardial infarction   Acute respiratory failure with hypoxia (HCC)   Acute hypoxic respiratory failure Patient presenting to the ED for progressive dyspnea over the last few months.  Patient was found to be hypoxic on presentation with SPO2 in the 80s.  Nonoxygen dependent at baseline.  Etiology likely multifactorial with increased lower extremity edema and elevated BNP consistent with congestive heart  failure exacerbation in conjunction with COPD exacerbation in conjunction with continued tobacco use disorder. --Continue supplemental oxygen, maintain SPO2 greater than 88% --Continue treatment as below --Ambulatory O2 screen today to assess further oxygen needs  Acute on chronic diastolic congestive heart failure Patient with worsening lower extremity edema over the past few months.  Elevated BNP. TTE with LVEF 55-60%, grade 1 diastolic dysfunction, RV systolic function mildly reduced. --wt 102.1>99.3>95.5>91.2kg --net negative 4.0L past 24h and net negative 7.8L since admission --Furosemide 40 mg IV BID --TED hose --Strict I's and O's and daily weights --Continue to monitor BMP daily while on aggressive IV diuresis  Acute COPD exacerbation Patient with wheezing and hypoxia on presentation.  Continues with tobacco abuse.  Not on oxygen at baseline. --Prednisone 40 mg p.o. daily x 5 days --Started Dulera 200-5 mcg 2 puffs twice daily --Albuterol nebs every 2 hours as needed for shortness of breath/wheezing --Continue supplemental oxygen, titrate to maintain SPO2 greater than 88%; currently 3 L nasal cannula with SPO2 96%  Hyperkalemia: Resolved Potassium 5.6 on admission, treated with Lokelma 10 mg x 1 on 08/23/2020.[name] --K 3.8 today --Continue monitor on telemetry --Follow electrolytes closely daily  Cirrhosis with multiple hepatic cysts Incidental finding on CT angiogram chest with cirrhotic liver with multiple small hepatic cysts.  Does not follow with gastroenterology. --Consider further outpatient work-up with GI referral versus MR abdomen with liver protocol  Nicotine dependence Counseled on need for complete cessation.  Tinea cruris Patient with redness to groin area, likely secondary to incontinence while on IV diuretics. --Nystatin powder twice daily to groin area  Weakness, deconditioning, gait disturbance: --PT currently recommending SNF; patient seems adamant of  returning back to ALF --  Continue therapy efforts while inpatient   DVT prophylaxis: Lovenox Code Status: DNR Family Communication: No family present at bedside this morning; updated patient's son at bedside extensively yesterday  Disposition Plan:  Status is: Inpatient  Remains inpatient appropriate because:Unsafe d/c plan, IV treatments appropriate due to intensity of illness or inability to take PO and Inpatient level of care appropriate due to severity of illness   Dispo:  Patient From: Assisted Living Facility  Planned Disposition: Skilled Nursing Facility vs ALF  Expected discharge date: 08/27/2020  Medically stable for discharge: No   Consultants:   None  Procedures:   None  Antimicrobials:   None   Subjective: Patient seen and examined at bedside, resting comfortably in bed.  Currently receiving breathing treatment.  Respiratory therapy present.  States breathing continues to improve slowly daily.  Continues on 2 L nasal cannula.  Discussed with patient will attempt walk test again to assess ambulatory O2 needs.  No family present at bedside this morning.  No other questions or concerns at this time. Denies headache, no visual changes, no chest pain, no palpitations, no abdominal pain, no fever/chills/night sweats, no nausea cefonicid/diarrhea.  No acute concerns overnight per nursing staff.  Objective: Vitals:   08/25/20 2256 08/26/20 0341 08/26/20 0500 08/26/20 0819  BP: 123/84 122/86    Pulse: 95 98  89  Resp: 18 16  16   Temp: 98.4 F (36.9 C) 98.7 F (37.1 C)    TempSrc: Oral Oral    SpO2: 95% 98%  95%  Weight:   91.2 kg   Height:        Intake/Output Summary (Last 24 hours) at 08/26/2020 0947 Last data filed at 08/26/2020 0500 Gross per 24 hour  Intake 120 ml  Output 4125 ml  Net -4005 ml   Filed Weights   08/24/20 0552 08/25/20 0546 08/26/20 0500  Weight: 99.3 kg 95.5 kg 91.2 kg    Examination:  General exam: Appears calm and comfortable,  slightly conversationally dyspneic Respiratory system: Decreased breath sounds bilateral bases with crackles, late expiratory wheezing bilaterally, normal respiratory effort without accessory muscle use, on 2 L nasal cannula with SPO2 98% Cardiovascular system: S1 & S2 heard, RRR. No JVD, murmurs, rubs, gallops or clicks.  1+ pitting edema bilateral lower extremities to mid shin  Gastrointestinal system: Abdomen is nondistended, soft and nontender. No organomegaly or masses felt. Normal bowel sounds heard. Central nervous system: Alert and oriented. No focal neurological deficits. Extremities: Symmetric 5 x 5 power. Skin: No rashes, lesions or ulcers Psychiatry: Judgement and insight appear poor. Mood & affect appropriate.     Data Reviewed: I have personally reviewed following labs and imaging studies  CBC: Recent Labs  Lab 08/22/20 1627 08/22/20 1640 08/23/20 0416  WBC 5.3  --  4.0  NEUTROABS 3.8  --  3.5  HGB 15.3 16.7 15.3  HCT 49.1 49.0 48.9  MCV 102.9*  --  101.9*  PLT 138*  --  135*   Basic Metabolic Panel: Recent Labs  Lab 08/22/20 1627 08/22/20 1640 08/23/20 0416 08/24/20 0240 08/25/20 0216 08/26/20 0248  NA 141 140 142 141 141 138  K 5.3* 5.1 5.6* 4.2 3.9 3.8  CL 101 101 98 96* 92* 90*  CO2 30  --  33* 35* 41* 38*  GLUCOSE 98 95 152* 127* 133* 83  BUN 28* 33* 29* 34* 30* 22  CREATININE 1.08 1.00 1.22 1.00 0.91 0.87  CALCIUM 8.5*  --  8.5* 8.1* 8.4* 8.5*  MG  --   --  2.1  --   --   --    GFR: Estimated Creatinine Clearance: 69.3 mL/min (by C-G formula based on SCr of 0.87 mg/dL). Liver Function Tests: Recent Labs  Lab 08/22/20 1627  AST 26  ALT 19  ALKPHOS 62  BILITOT 1.1  PROT 6.6  ALBUMIN 3.3*   No results for input(s): LIPASE, AMYLASE in the last 168 hours. No results for input(s): AMMONIA in the last 168 hours. Coagulation Profile: Recent Labs  Lab 08/23/20 0416  INR 1.1   Cardiac Enzymes: No results for input(s): CKTOTAL, CKMB,  CKMBINDEX, TROPONINI in the last 168 hours. BNP (last 3 results) No results for input(s): PROBNP in the last 8760 hours. HbA1C: No results for input(s): HGBA1C in the last 72 hours. CBG: No results for input(s): GLUCAP in the last 168 hours. Lipid Profile: No results for input(s): CHOL, HDL, LDLCALC, TRIG, CHOLHDL, LDLDIRECT in the last 72 hours. Thyroid Function Tests: No results for input(s): TSH, T4TOTAL, FREET4, T3FREE, THYROIDAB in the last 72 hours. Anemia Panel: No results for input(s): VITAMINB12, FOLATE, FERRITIN, TIBC, IRON, RETICCTPCT in the last 72 hours. Sepsis Labs: No results for input(s): PROCALCITON, LATICACIDVEN in the last 168 hours.  Recent Results (from the past 240 hour(s))  Resp Panel by RT-PCR (Flu A&B, Covid) Nasopharyngeal Swab     Status: None   Collection Time: 08/22/20  4:27 PM   Specimen: Nasopharyngeal Swab; Nasopharyngeal(NP) swabs in vial transport medium  Result Value Ref Range Status   SARS Coronavirus 2 by RT PCR NEGATIVE NEGATIVE Final    Comment: (NOTE) SARS-CoV-2 target nucleic acids are NOT DETECTED.  The SARS-CoV-2 RNA is generally detectable in upper respiratory specimens during the acute phase of infection. The lowest concentration of SARS-CoV-2 viral copies this assay can detect is 138 copies/mL. A negative result does not preclude SARS-Cov-2 infection and should not be used as the sole basis for treatment or other patient management decisions. A negative result may occur with  improper specimen collection/handling, submission of specimen other than nasopharyngeal swab, presence of viral mutation(s) within the areas targeted by this assay, and inadequate number of viral copies(<138 copies/mL). A negative result must be combined with clinical observations, patient history, and epidemiological information. The expected result is Negative.  Fact Sheet for Patients:  BloggerCourse.com  Fact Sheet for Healthcare  Providers:  SeriousBroker.it  This test is no t yet approved or cleared by the [place] FDA and  has been authorized for detection and/or diagnosis of SARS-CoV-2 by FDA under an Emergency Use Authorization (EUA). This EUA will remain  in effect (meaning this test can be used) for the duration of the COVID-19 declaration under Section 564(b)(1) of the Act, 21 U.S.C.section 360bbb-3(b)(1), unless the authorization is terminated  or revoked sooner.       Influenza A by PCR NEGATIVE NEGATIVE Final   Influenza B by PCR NEGATIVE NEGATIVE Final    Comment: (NOTE) The Xpert Xpress SARS-CoV-2/FLU/RSV plus assay is intended as an aid in the diagnosis of influenza from Nasopharyngeal swab specimens and should not be used as a sole basis for treatment. Nasal washings and aspirates are unacceptable for Xpert Xpress SARS-CoV-2/FLU/RSV testing.  Fact Sheet for Patients: BloggerCourse.com  Fact Sheet for Healthcare Providers: SeriousBroker.it  This test is not yet approved or cleared by the [place] FDA and has been authorized for detection and/or diagnosis of SARS-CoV-2 by FDA under an Emergency Use Authorization (EUA). This EUA will remain in effect (meaning this test can be  used) for the duration of the COVID-19 declaration under Section 564(b)(1) of the Act, 21 U.S.C. section 360bbb-3(b)(1), unless the authorization is terminated or revoked.  Performed at [facility], [address], [place], [place] [address]          Radiology Studies: No results found.      Scheduled Meds: . enoxaparin (LOVENOX) injection  40 mg Subcutaneous Q24H  . feeding supplement  237 mL Oral BID BM  . furosemide  40 mg Intravenous BID  . ipratropium  0.5 mg Nebulization Q6H  . mometasone-formoterol  2 puff Inhalation BID  . nystatin   Topical BID  . predniSONE  40 mg Oral Q breakfast  . sodium chloride  flush  3 mL Intravenous Q12H   Continuous Infusions: . sodium chloride       LOS: 3 days    Time spent: 36 minutes spent on chart review, discussion with nursing staff, consultants, updating family and interview/physical exam; more than 50% of that time was spent in counseling and/or coordination of care.    Alvira Philips [place], DO [facility] Available via Epic secure chat 7am-7pm After these hours, please refer to coverage provider listed on [email or web address] 08/26/2020, 9:47 AM

## 2020-08-26 NOTE — Progress Notes (Signed)
SATURATION QUALIFICATIONS: (This note is used to comply with regulatory documentation for home oxygen)  Patient Saturations on Room Air at Rest = 92%  Patient Saturations on Room Air while Ambulating = 94%  Patient Saturations on 0 Liters of oxygen while Ambulating = 94%  Please briefly explain why patient needs home oxygen: pt maintained oxygen sats at 94% while ambulating

## 2020-08-27 LAB — BASIC METABOLIC PANEL
Anion gap: 11 (ref 5–15)
BUN: 30 mg/dL — ABNORMAL HIGH (ref 8–23)
CO2: 38 mmol/L — ABNORMAL HIGH (ref 22–32)
Calcium: 8.6 mg/dL — ABNORMAL LOW (ref 8.9–10.3)
Chloride: 88 mmol/L — ABNORMAL LOW (ref 98–111)
Creatinine, Ser: 0.98 mg/dL (ref 0.61–1.24)
GFR, Estimated: >60 mL/min (ref >60)
Glucose, Bld: 114 mg/dL — ABNORMAL HIGH (ref 70–99)
Potassium: 3.9 mmol/L (ref 3.5–5.1)
Sodium: 137 mmol/L (ref 135–145)

## 2020-08-27 MED ORDER — FUROSEMIDE 40 MG PO TABS
40.0000 mg | ORAL_TABLET | Freq: Two times a day (BID) | ORAL | Status: DC
  Administered 2020-08-27 (×2): 40 mg via ORAL
  Filled 2020-08-27 (×2): qty 1

## 2020-08-27 MED ORDER — POLYETHYLENE GLYCOL 3350 17 G PO PACK
17.0000 g | PACK | Freq: Once | ORAL | Status: AC
  Filled 2020-08-27: qty 1

## 2020-08-27 NOTE — Progress Notes (Signed)
SATURATION QUALIFICATIONS: (This note is used to comply with regulatory documentation for home oxygen)  Patient Saturations on Room Air at Rest = 90%  Patient Saturations on Room Air while Ambulating = 84%  Patient Saturations on 2 Liters of oxygen while Ambulating = 91%  Please briefly explain why patient needs home oxygen:  Pt's oxygen sats decreased to 84% while ambulating on room air.

## 2020-08-27 NOTE — Progress Notes (Signed)
PROGRESS NOTE    Hector Beltran  ZOX:[id number] DOB: [birth year] DOA: 08/22/2020 PCP: Renford DillsPolite, Ronald, MD    Brief Narrative:  Hector EndHarry N Beltran is a 86 year old male with past medical history significant for COPD, peripheral neuropathy, chronic diastolic congestive heart failure, nicotine dependence who presented to [facility] ED with complaints of progressive shortness of breath and bilateral lower extremity swelling.  Following Thanksgiving, patient noticed increasing shortness of breath with lower extremity edema; now requiring to sleep in a recliner.  Patient also notes nonproductive cough.  Denies fever/chills, no recent sick contacts, no recent travel and no confirmed contact with individuals positive for Covid-19 viral infection.  In the ED, patient was noted to be hypoxic with oxygen saturations in the low 80s; and placed on supplemental oxygen.  Patient is afebrile without leukocytosis.  CT angiogram chest negative for pulmonary embolism but does note bilateral pleural effusions and cirrhotic appearing liver with multiple cysts and large cyst left upper quadrant measuring 6.3 x 5.1 cm.  BNP elevated 281.  Patient was given IV furosemide 40 mg.  Hospitalist service consulted for further evaluation and management.   Assessment & Plan:   Principal Problem:   Acute CHF (congestive heart failure) (HCC) Active Problems:   Cirrhosis of liver (HCC)   COPD with acute exacerbation (HCC)   Nicotine dependence, cigarettes, uncomplicated   Hyperkalemia   Hepatic cyst   Elevated troponin level not due myocardial infarction   Acute respiratory failure with hypoxia (HCC)   Acute hypoxic respiratory failure Patient presenting to the ED for progressive dyspnea over the last few months.  Patient was found to be hypoxic on presentation with SPO2 in the 80s.  Nonoxygen dependent at baseline.  Etiology likely multifactorial with increased lower extremity edema and elevated BNP consistent with congestive heart  failure exacerbation in conjunction with COPD exacerbation in conjunction with continued tobacco use disorder. --Continue supplemental oxygen, maintain SPO2 greater than 88% --Continue treatment as below --Ambulatory O2 screen today with desaturation to 84% on room air with ambulation, maintained 91% on 2 L nasal cannula; will likely need home O2  Acute on chronic diastolic congestive heart failure Patient with worsening lower extremity edema over the past few months.  Elevated BNP. TTE with LVEF 55-60%, grade 1 diastolic dysfunction, RV systolic function mildly reduced. --wt 102.1>99.3>95.5>91.2>92.5kg --net negative 1L past 24h and net negative 8.8L since admission --Transition IV furosemide to 40 mg p.o. twice daily today --TED hose --Strict I's and O's and daily weights --Continue to monitor BMP daily   Acute COPD exacerbation Patient with wheezing and hypoxia on presentation.  Continues with tobacco abuse.  Not on oxygen at baseline. --Prednisone 40 mg p.o. daily x 5 days; to complete today --Started Dulera 200-5 mcg 2 puffs twice daily --Albuterol nebs every 2 hours as needed for shortness of breath/wheezing --Continue supplemental oxygen, titrate to maintain SPO2 greater than 88%; currently 2 L nasal cannula with SPO2 98%  Hyperkalemia: Resolved Potassium 5.6 on admission, treated with Lokelma 10 mg x 1 on 08/23/2020.[name]. --K 3.9 today --Continue monitor on telemetry --Follow electrolytes closely daily  Cirrhosis with multiple hepatic cysts Incidental finding on CT angiogram chest with cirrhotic liver with multiple small hepatic cysts.  Does not follow with gastroenterology. --Consider further outpatient work-up with GI referral versus MR abdomen with liver protocol  Nicotine dependence Counseled on need for complete cessation.  Tinea cruris Patient with redness to groin area, likely secondary to incontinence while on IV diuretics. --Nystatin powder twice daily to  groin  area  Weakness, deconditioning, gait disturbance: --PT currently recommending SNF; patient seems adamant of returning back to ALF --Continue therapy efforts while inpatient --Awaiting to hear back from the son regarding disposition, SNF versus back to independent living with home health   DVT prophylaxis: Lovenox Code Status: DNR Family Communication: No family present at bedside this morning; attempted to update patient's son via telephone this morning, unsuccessful  Disposition Plan:  Status is: Inpatient  Remains inpatient appropriate because:Unsafe d/c plan, IV treatments appropriate due to intensity of illness or inability to take PO and Inpatient level of care appropriate due to severity of illness   Dispo:  Patient From: Assisted Living Facility  Planned Disposition: Skilled Nursing Facility vs ALF with home health  Expected discharge date: 08/27/2020  Medically stable for discharge: No   Consultants:   None  Procedures:   None  Antimicrobials:   None   Subjective: Patient seen and examined at bedside, resting comfortably in bed.  Patient states "was told lips were blue last night".  Continues on 2 L nasal cannula.  Ambulatory screen again this morning notable for desaturation to 84% on room air with 91% on 2 L.  No family present this morning, attempted to update via telephone unsuccessful.  No other questions or concerns at this time.  Discussed with patient need to determine whether discharging back to his independent living with home health versus SNF.  Denies headache, no visual changes, no chest pain, no palpitations, no abdominal pain, no fever/chills/night sweats, no nausea cefonicid/diarrhea.  No acute concerns overnight per nursing staff.  Objective: Vitals:   08/26/20 1822 08/26/20 2251 08/27/20 0424 08/27/20 0512  BP: 132/75 130/68  132/77  Pulse: 93 88  90  Resp: 16 16  18   Temp: 97.8 F (36.6 C) 98.4 F (36.9 C)  98.4 F (36.9 C)  TempSrc: Oral  Oral  Oral  SpO2: 92% 95%  97%  Weight:  92.5 kg 92.5 kg   Height:        Intake/Output Summary (Last 24 hours) at 08/27/2020 1011 Last data filed at 08/27/2020 0914 Gross per 24 hour  Intake 3 ml  Output 1000 ml  Net -997 ml   Filed Weights   08/26/20 0500 08/26/20 2251 08/27/20 0424  Weight: 91.2 kg 92.5 kg 92.5 kg    Examination:  General exam: Appears calm and comfortable Respiratory system: Breath sounds slightly decreased bilateral bases, mild late expiratory wheezing, no crackles, normal respiratory effort, on 2 L nasal cannula with SPO2 98% Cardiovascular system: S1 & S2 heard, RRR. No JVD, murmurs, rubs, gallops or clicks.  1+ pitting edema bilateral lower extremities to mid shin; TED hose noted to be pulled down Gastrointestinal system: Abdomen is nondistended, soft and nontender. No organomegaly or masses felt. Normal bowel sounds heard. Central nervous system: Alert and oriented. No focal neurological deficits. Extremities: Symmetric 5 x 5 power. Skin: No rashes, lesions or ulcers Psychiatry: Judgement and insight appear poor. Mood & affect appropriate.     Data Reviewed: I have personally reviewed following labs and imaging studies  CBC: Recent Labs  Lab 08/22/20 1627 08/22/20 1640 08/23/20 0416  WBC 5.3  --  4.0  NEUTROABS 3.8  --  3.5  HGB 15.3 16.7 15.3  HCT 49.1 49.0 48.9  MCV 102.9*  --  101.9*  PLT 138*  --  135*   Basic Metabolic Panel: Recent Labs  Lab 08/23/20 0416 08/24/20 0240 08/25/20 0216 08/26/20 0248 08/27/20 0257  NA  142 141 141 138 137  K 5.6* 4.2 3.9 3.8 3.9  CL 98 96* 92* 90* 88*  CO2 33* 35* 41* 38* 38*  GLUCOSE 152* 127* 133* 83 114*  BUN 29* 34* 30* 22 30*  CREATININE 1.22 1.00 0.91 0.87 0.98  CALCIUM 8.5* 8.1* 8.4* 8.5* 8.6*  MG 2.1  --   --   --   --    GFR: Estimated Creatinine Clearance: 61.5 mL/min (by C-G formula based on SCr of 0.98 mg/dL). Liver Function Tests: Recent Labs  Lab 08/22/20 1627  AST 26  ALT  19  ALKPHOS 62  BILITOT 1.1  PROT 6.6  ALBUMIN 3.3*   No results for input(s): LIPASE, AMYLASE in the last 168 hours. No results for input(s): AMMONIA in the last 168 hours. Coagulation Profile: Recent Labs  Lab 08/23/20 0416  INR 1.1   Cardiac Enzymes: No results for input(s): CKTOTAL, CKMB, CKMBINDEX, TROPONINI in the last 168 hours. BNP (last 3 results) No results for input(s): PROBNP in the last 8760 hours. HbA1C: No results for input(s): HGBA1C in the last 72 hours. CBG: No results for input(s): GLUCAP in the last 168 hours. Lipid Profile: No results for input(s): CHOL, HDL, LDLCALC, TRIG, CHOLHDL, LDLDIRECT in the last 72 hours. Thyroid Function Tests: No results for input(s): TSH, T4TOTAL, FREET4, T3FREE, THYROIDAB in the last 72 hours. Anemia Panel: No results for input(s): VITAMINB12, FOLATE, FERRITIN, TIBC, IRON, RETICCTPCT in the last 72 hours. Sepsis Labs: No results for input(s): PROCALCITON, LATICACIDVEN in the last 168 hours.  Recent Results (from the past 240 hour(s))  Resp Panel by RT-PCR (Flu A&B, Covid) Nasopharyngeal Swab     Status: None   Collection Time: 08/22/20  4:27 PM   Specimen: Nasopharyngeal Swab; Nasopharyngeal(NP) swabs in vial transport medium  Result Value Ref Range Status   SARS Coronavirus 2 by RT PCR NEGATIVE NEGATIVE Final    Comment: (NOTE) SARS-CoV-2 target nucleic acids are NOT DETECTED.  The SARS-CoV-2 RNA is generally detectable in upper respiratory specimens during the acute phase of infection. The lowest concentration of SARS-CoV-2 viral copies this assay can detect is 138 copies/mL. A negative result does not preclude SARS-Cov-2 infection and should not be used as the sole basis for treatment or other patient management decisions. A negative result may occur with  improper specimen collection/handling, submission of specimen other than nasopharyngeal swab, presence of viral mutation(s) within the areas targeted by this  assay, and inadequate number of viral copies(<138 copies/mL). A negative result must be combined with clinical observations, patient history, and epidemiological information. The expected result is Negative.  Fact Sheet for Patients:  BloggerCourse.com  Fact Sheet for Healthcare Providers:  SeriousBroker.it  This test is no t yet approved or cleared by the [place] FDA and  has been authorized for detection and/or diagnosis of SARS-CoV-2 by FDA under an Emergency Use Authorization (EUA). This EUA will remain  in effect (meaning this test can be used) for the duration of the COVID-19 declaration under Section 564(b)(1) of the Act, 21 U.S.C.section 360bbb-3(b)(1), unless the authorization is terminated  or revoked sooner.       Influenza A by PCR NEGATIVE NEGATIVE Final   Influenza B by PCR NEGATIVE NEGATIVE Final    Comment: (NOTE) The Xpert Xpress SARS-CoV-2/FLU/RSV plus assay is intended as an aid in the diagnosis of influenza from Nasopharyngeal swab specimens and should not be used as a sole basis for treatment. Nasal washings and aspirates are unacceptable for Xpert  Xpress SARS-CoV-2/FLU/RSV testing.  Fact Sheet for Patients: BloggerCourse.com  Fact Sheet for Healthcare Providers: SeriousBroker.it  This test is not yet approved or cleared by the [place] FDA and has been authorized for detection and/or diagnosis of SARS-CoV-2 by FDA under an Emergency Use Authorization (EUA). This EUA will remain in effect (meaning this test can be used) for the duration of the COVID-19 declaration under Section 564(b)(1) of the Act, 21 U.S.C. section 360bbb-3(b)(1), unless the authorization is terminated or revoked.  Performed at [facility], [address] [place], [place] [address]          Radiology Studies: No results found.      Scheduled Meds: .  enoxaparin (LOVENOX) injection  40 mg Subcutaneous Q24H  . feeding supplement  237 mL Oral BID BM  . furosemide  40 mg Oral BID  . mometasone-formoterol  2 puff Inhalation BID  . nystatin   Topical BID  . sodium chloride flush  3 mL Intravenous Q12H   Continuous Infusions: . sodium chloride       LOS: 4 days    Time spent: 36 minutes spent on chart review, discussion with nursing staff, consultants, updating family and interview/physical exam; more than 50% of that time was spent in counseling and/or coordination of care.    Alvira Philips [place], DO [facility] Available via Epic secure chat 7am-7pm After these hours, please refer to coverage provider listed on [email or web address] 08/27/2020, 10:11 AM

## 2020-08-27 NOTE — [place] FL2 (Addendum)
[place] MEDICAID FL2 LEVEL OF CARE SCREENING TOOL     IDENTIFICATION  Patient Name: Hector Beltran Birthdate: [birth year] Sex: male Admission Date (Current Location): 08/22/2020  [facility] and [place] Number:  Producer, television/film/video and Address:  [facility] [place], [address], [place], [place] [address]      Provider Number: [id number]  Attending Physician Name and Address:  [place], Alvira Philips, DO  Relative Name and Phone Number:       Current Level of Care:   Recommended Level of Care: Skilled Nursing Facility Prior Approval Number:    Date Approved/Denied:   PASRR Number: [id number]  Discharge Plan:      Current Diagnoses: Patient Active Problem List   Diagnosis Date Noted  . Acute CHF (congestive heart failure) (HCC) 08/22/2020  . Cirrhosis of liver (HCC) 08/22/2020  . COPD with acute exacerbation (HCC) 08/22/2020  . Nicotine dependence, cigarettes, uncomplicated 08/22/2020  . Hyperkalemia 08/22/2020  . Hepatic cyst 08/22/2020  . Elevated troponin level not due myocardial infarction 08/22/2020  . Acute respiratory failure with hypoxia (HCC) 08/22/2020  . Gait abnormality 02/09/2020  . Norovirus 11/22/2018  . Diarrhea of presumed infectious origin 11/21/2018  . Peripheral neuropathy 09/30/2017    Orientation RESPIRATION BLADDER Height & Weight     Self,Time,Situation,Place  Normal Incontinent Weight: 204 lb 0.2 oz (92.5 kg) Height:  6\' 4"  (193 cm)  BEHAVIORAL SYMPTOMS/MOOD NEUROLOGICAL BOWEL NUTRITION STATUS      Incontinent Diet  AMBULATORY STATUS COMMUNICATION OF NEEDS Skin   Extensive Assist Verbally Normal                       Personal Care Assistance Level of Assistance  Bathing,Feeding,Dressing Bathing Assistance: Maximum assistance Feeding assistance: Independent Dressing Assistance: Maximum assistance     Functional Limitations Info  Sight,Hearing,Speech Sight Info: Adequate Hearing Info: Adequate Speech Info:  Adequate    SPECIAL CARE FACTORS FREQUENCY  PT (By licensed PT),OT (By licensed OT)     PT Frequency: 5x a week OT Frequency: 5x a week            Contractures Contractures Info: Not present    Additional Factors Info  Code Status,Allergies Code Status Info: DNR Allergies Info: NKA           Current Medications (08/27/2020):  This is the current hospital active medication list Current Facility-Administered Medications  Medication Dose Route Frequency Provider Last Rate Last Admin  . 0.9 %  sodium chloride infusion  250 mL Intravenous PRN Shalhoub, 08/29/2020, MD      . acetaminophen (TYLENOL) tablet 650 mg  650 mg Oral Q4H PRN Shalhoub, Deno Lunger, MD      . albuterol (PROVENTIL) (2.5 MG/3ML) 0.083% nebulizer solution 2.5 mg  2.5 mg Nebulization Q2H PRN Shalhoub, Deno Lunger, MD      . enoxaparin (LOVENOX) injection 40 mg  40 mg Subcutaneous Q24H Shalhoub, Deno Lunger, MD   40 mg at 08/26/20 2041  . feeding supplement (ENSURE ENLIVE / ENSURE PLUS) liquid 237 mL  237 mL Oral BID BM 2042, Eric J, DO   237 mL at 08/27/20 1148  . furosemide (LASIX) tablet 40 mg  40 mg Oral BID 08/29/20, Eric J, DO   40 mg at 08/27/20 0756  . mometasone-formoterol (DULERA) 200-5 MCG/ACT inhaler 2 puff  2 puff Inhalation BID 08/29/20, Eric J, DO   2 puff at 08/27/20 08/29/20  . nystatin (MYCOSTATIN/NYSTOP) topical powder  Topical BID [place], Eric J, DO   Given at 08/27/20 0263  . ondansetron (ZOFRAN) injection 4 mg  4 mg Intravenous Q6H PRN Shalhoub, Deno Lunger, MD      . polyethylene glycol (MIRALAX / GLYCOLAX) packet 17 g  17 g Oral Daily PRN Marinda Elk, MD   17 g at 08/27/20 0857  . sodium chloride flush (NS) 0.9 % injection 3 mL  3 mL Intravenous Q12H Shalhoub, Deno Lunger, MD   3 mL at 08/27/20 0914  . sodium chloride flush (NS) 0.9 % injection 3 mL  3 mL Intravenous PRN Shalhoub, Deno Lunger, MD         Discharge Medications: Please see discharge summary for a list of discharge  medications.  Relevant Imaging Results:  Relevant Lab Results:   Additional Information SSN [id number]  Jimmy Picket, [place]

## 2020-08-27 NOTE — Progress Notes (Signed)
Physical Therapy Treatment Patient Details Name: SHAWNTA Beltran MRN: [id number] DOB: 03-Jan-1928 Today's Date: 08/27/2020    History of Present Illness 86yo male c/o progressive SOB and BLE edema. Had a fall on 12/8 when trying to stand up from the toilet. Found to be hypoxic in the ED. PE negative. Admitted with acute CHF and acute respiratory failure with hypoxia. PMH gait abnormality, peripheral neuropathy, COPD, nicotene dependence    PT Comments    Pt progressing towards goals. Continues to present with decreased safety awareness and required min guard to min A for mobility using RW. Continue to recommend SNF level therapies at d/c. Will continue to follow acutely.    Follow Up Recommendations  SNF;Supervision/Assistance - 24 hour     Equipment Recommendations  Rolling walker with 5" wheels;3in1 (PT)    Recommendations for Other Services       Precautions / Restrictions Precautions Precautions: Fall Precaution Comments: poor safety awareness with RW( but of not he is used to using a 3 and 4 wheeled walker at home and out and about, which he stands back from more than a standard RW), watch sats (as low as 86% on 3 liters while up and about) Restrictions Weight Bearing Restrictions: No    Mobility  Bed Mobility               General bed mobility comments: In chair upon entry  Transfers Overall transfer level: Needs assistance Equipment used: Rolling walker (2 wheeled) Transfers: Sit to/from Stand Sit to Stand: Min guard         General transfer comment: Min guard for safety. Cues for hand placement.  Ambulation/Gait Ambulation/Gait assistance: Min guard;Min assist Gait Distance (Feet): 125 Feet Assistive device: Rolling walker (2 wheeled) Gait Pattern/deviations: Step-through pattern;Decreased step length - right;Decreased step length - left;Decreased stride length;Decreased dorsiflexion - left;Decreased dorsiflexion - right;Trendelenburg;Drifts  right/left;Trunk flexed Gait velocity: Decreased   General Gait Details: Shuffle type gait. Unaware of limitations and signs of fatigue. Cues for sequencing using RW.   Stairs             Wheelchair Mobility    Modified Rankin (Stroke Patients Only)       Balance Overall balance assessment: Needs assistance Sitting-balance support: No upper extremity supported;Feet supported Sitting balance-Leahy Scale: Good     Standing balance support: Bilateral upper extremity supported;During functional activity Standing balance-Leahy Scale: Poor Standing balance comment: Reliant on BUE Support                            Cognition Arousal/Alertness: Awake/alert Behavior During Therapy: WFL for tasks assessed/performed Overall Cognitive Status: Impaired/Different from baseline Area of Impairment: Safety/judgement                         Safety/Judgement: Decreased awareness of safety;Decreased awareness of deficits     General Comments: Poor awareness of deficits. Insistent on performing further mobility, despite physical signs that he was fatiguing.      Exercises      General Comments General comments (skin integrity, edema, etc.): Pt's son present      Pertinent Vitals/Pain Pain Assessment: No/denies pain    Home Living                      Prior Function            PT Goals (current goals can now be found in  the care plan section) Acute Rehab PT Goals Patient Stated Goal: to get back to what he normally would be doing PT Goal Formulation: With patient Time For Goal Achievement: 09/06/20 Potential to Achieve Goals: Fair Progress towards PT goals: Progressing toward goals    Frequency    Min 3X/week      PT Plan Current plan remains appropriate    Co-evaluation              AM-PAC PT "6 Clicks" Mobility   Outcome Measure  Help needed turning from your back to your side while in a flat bed without using  bedrails?: None Help needed moving from lying on your back to sitting on the side of a flat bed without using bedrails?: A Little Help needed moving to and from a bed to a chair (including a wheelchair)?: A Little Help needed standing up from a chair using your arms (e.g., wheelchair or bedside chair)?: A Little Help needed to walk in hospital room?: A Little Help needed climbing 3-5 steps with a railing? : A Lot 6 Click Score: 18    End of Session Equipment Utilized During Treatment: Gait belt;Oxygen Activity Tolerance: Patient tolerated treatment well Patient left: in chair;with call bell/phone within reach Nurse Communication: Mobility status PT Visit Diagnosis: Unsteadiness on feet (R26.81);Muscle weakness (generalized) (M62.81);History of falling (Z91.81);Difficulty in walking, not elsewhere classified (R26.2)     Time: 5697-9480 PT Time Calculation (min) (ACUTE ONLY): 17 min  Charges:  $Gait Training: 8-22 mins                     Cindee Salt, DPT  Acute Rehabilitation Services  Pager: [phone] Office: [phone]    [name] 08/27/2020, 5:36 PM

## 2020-08-28 LAB — SARS CORONAVIRUS 2 BY RT PCR (HOSPITAL ORDER, PERFORMED IN ~~LOC~~ HOSPITAL LAB): SARS Coronavirus 2: NEGATIVE

## 2020-08-28 LAB — BASIC METABOLIC PANEL
Anion gap: 14 (ref 5–15)
BUN: 35 mg/dL — ABNORMAL HIGH (ref 8–23)
CO2: 31 mmol/L (ref 22–32)
Calcium: 8.6 mg/dL — ABNORMAL LOW (ref 8.9–10.3)
Chloride: 93 mmol/L — ABNORMAL LOW (ref 98–111)
Creatinine, Ser: 1 mg/dL (ref 0.61–1.24)
GFR, Estimated: >60 mL/min (ref >60)
Glucose, Bld: 80 mg/dL (ref 70–99)
Potassium: 4.3 mmol/L (ref 3.5–5.1)
Sodium: 138 mmol/L (ref 135–145)

## 2020-08-28 MED ORDER — POLYETHYLENE GLYCOL 3350 17 G PO PACK
17.0000 g | PACK | Freq: Two times a day (BID) | ORAL | Status: AC
  Administered 2020-08-28 (×2): 17 g via ORAL
  Filled 2020-08-28 (×3): qty 1

## 2020-08-28 MED ORDER — FUROSEMIDE 40 MG PO TABS
40.0000 mg | ORAL_TABLET | Freq: Every day | ORAL | Status: DC
  Administered 2020-08-29 – 2020-08-31 (×3): 40 mg via ORAL
  Filled 2020-08-28 (×3): qty 1

## 2020-08-28 NOTE — TOC Progression Note (Signed)
Transition of Care [facility]) - Progression Note    Patient Details  Name: Hector Beltran MRN: [id number] Date of Birth: 02/03/1928  Transition of Care [facility]) CM/SW Contact  Jimmy Picket, [place] Phone Number: 08/28/2020, 4:53 PM  Clinical Narrative:     CSW gave pt and son bed offers. They will review offers and have an answer tomorrow per son.   Expected Discharge Plan: Assisted Living Barriers to Discharge: Continued Medical Work up  Expected Discharge Plan and Services Expected Discharge Plan: Assisted Living In-house Referral: Clinical Social Work     Living arrangements for the past 2 months: Assisted Living Facility                                       Social Determinants of Health (SDOH) Interventions    Readmission Risk Interventions No flowsheet data found.  Jimmy Picket, Theresia Majors, [place] Clinical Social Worker [phone]

## 2020-08-28 NOTE — Progress Notes (Signed)
SATURATION QUALIFICATIONS:   Patient Saturations on Room Air at Rest = 93% Patient  Saturations on [employer] while Ambulating = 90% Patient Saturations on 2 Liters of oxygen while Ambulating = >96%  Please briefly explain why patient needs home oxygen: Pt continues to present with dyspnea with exertion on RA during mobility tasks needing supplemental O2 to maintain sats >90%.   Pollyann Glen C., COTA/L Acute Rehabilitation Services [phone]

## 2020-08-28 NOTE — Progress Notes (Signed)
Occupational Therapy Treatment Patient Details Name: Hector Beltran MRN: [id number] DOB: Oct 05, 1927 Today's Date: 08/28/2020    History of present illness 86yo male c/o progressive SOB and BLE edema. Had a fall on 12/8 when trying to stand up from the toilet. Found to be hypoxic in the ED. PE negative. Admitted with acute CHF and acute respiratory failure with hypoxia. PMH gait abnormality, peripheral neuropathy, COPD, nicotene dependence   OT comments  Pt making steady progress towards OT goals this session. Pt continues to present with impaired safety awareness, decreased activity tolerance and dyspnea with exertion on RA. Pt insistent on wanting to progress with therapies however pt required cues to attend to physical symptoms of fatigue pt experiencing during session such as LOB or SOB. Pt completed x6 sit<>stands from chair with RW with min guard assist as precursor to higher level functional mobility tasks. Pt also completed household distance functional mobility with RW and MIN A needing most assist for RW mgmt as pt prefers to keep RW away from pt. Trialed pt on RA during mobility with pt desaturating to 90% needing an increased to 2L to maintain SpO2 >96%. Pt agreeable to SNF placement to facilitate functional independence with BADLs and functional mobility as pt reports leaving alone, will continue to follow acutely per POC.    Follow Up Recommendations  SNF;Supervision/Assistance - 24 hour    Equipment Recommendations  Other (comment) (3n1)    Recommendations for Other Services      Precautions / Restrictions Precautions Precautions: Fall Precaution Comments: poor safety awareness with RW( but of not he is used to using a 3 and 4 wheeled walker at home and out and about, which he stands back from more than a standard RW), watch sats (as low as 86% on 3 liters while up and about) Restrictions Weight Bearing Restrictions: No       Mobility Bed Mobility                General bed mobility comments: pt OOB in recliner  Transfers Overall transfer level: Needs assistance   Transfers: Sit to/from Stand Sit to Stand: Min guard;Supervision         General transfer comment: pt completed x6 sit<>stands from recliner needing up to min guard for safety, pt insistent on wanting to do more with therapy    Balance Overall balance assessment: Needs assistance Sitting-balance support: No upper extremity supported;Feet supported Sitting balance-Leahy Scale: Good     Standing balance support: Bilateral upper extremity supported;During functional activity Standing balance-Leahy Scale: Poor Standing balance comment: Reliant on BUE Support                           ADL either performed or assessed with clinical judgement   ADL Overall ADL's : Needs assistance/impaired                 Upper Body Dressing : Minimal assistance;Sitting Upper Body Dressing Details (indicate cue type and reason): to don gown as back side cover Lower Body Dressing: Min guard;Sitting/lateral leans Lower Body Dressing Details (indicate cue type and reason): to adjust socks from sitting in recliner Toilet Transfer: Ambulation;Minimal Production designer, theatre/television/film Details (indicate cue type and reason): simulated via functional mobility, MIN A for RW mgmt, pt tends to keep RW too far in front         Functional mobility during ADLs: Minimal assistance;Rolling walker General ADL Comments: pt continues to present with dyspnea with  exertion on RA, decreased activity tolerance and generalized weakness     Vision       Perception     Praxis      Cognition Arousal/Alertness: Awake/alert Behavior During Therapy: WFL for tasks assessed/performed Overall Cognitive Status: Impaired/Different from baseline Area of Impairment: Memory                     Memory: Decreased short-term memory   Safety/Judgement: Decreased awareness of safety;Decreased  awareness of deficits   Problem Solving: Slow processing General Comments: slightly slow to process, noted to mumble with speech and presents with decreased safety awareness with mobility tasks. pt insistent on walking more or completing more sit<>stands however pt noticeably fatigued. pt also denies ever falling at home although per chart review pt fell at home 12/8 from toilet, pt AxOx4        Exercises Other Exercises Other Exercises: x6 sit<>stands from recliner with min guard   Shoulder Instructions       General Comments pt on 2L upon arrival with SpO2 >96% on arrival. doffed O2 during ambulation with O2 dropping to 90%    Pertinent Vitals/ Pain       Pain Assessment: No/denies pain  Home Living                                          Prior Functioning/Environment              Frequency  Min 2X/week        Progress Toward Goals  OT Goals(current goals can now be found in the care plan section)  Progress towards OT goals: Progressing toward goals  Acute Rehab OT Goals Patient Stated Goal: to get back to what he normally would be doing OT Goal Formulation: With patient Time For Goal Achievement: 09/07/20 Potential to Achieve Goals: Good  Plan Discharge plan remains appropriate;Frequency remains appropriate    Co-evaluation                 AM-PAC OT "6 Clicks" Daily Activity     Outcome Measure   Help from another person eating meals?: None Help from another person taking care of personal grooming?: A Little Help from another person toileting, which includes using toliet, bedpan, or urinal?: A Lot Help from another person bathing (including washing, rinsing, drying)?: A Little Help from another person to put on and taking off regular upper body clothing?: A Little Help from another person to put on and taking off regular lower body clothing?: A Little 6 Click Score: 18    End of Session Equipment Utilized During Treatment:  Gait belt;Rolling walker;Oxygen;Other (comment) (2L)  OT Visit Diagnosis: Other abnormalities of gait and mobility (R26.89);Muscle weakness (generalized) (M62.81)   Activity Tolerance Patient tolerated treatment well   Patient Left in chair;with call bell/phone within reach;with chair alarm set   Nurse Communication Mobility status        Time: 3903-0092 OT Time Calculation (min): 25 min  Charges: OT General Charges $OT Visit: 1 Visit OT Treatments $Self Care/Home Management : 8-22 mins $Therapeutic Activity: 8-22 mins  Hector Amel., COTA/L Acute Rehabilitation Services [phone]    Hector Beltran 08/28/2020, 1:42 PM

## 2020-08-28 NOTE — Progress Notes (Signed)
PROGRESS NOTE    Hector Beltran  GQQ:[id number] DOB: May 06, 1928 DOA: 08/22/2020 PCP: Renford Dills, MD    Brief Narrative:  Hector Beltran is a 86 year old male with past medical history significant for COPD, peripheral neuropathy, chronic diastolic congestive heart failure, nicotine dependence who presented to [facility] ED with complaints of progressive shortness of breath and bilateral lower extremity swelling.  Following Thanksgiving, patient noticed increasing shortness of breath with lower extremity edema; now requiring to sleep in a recliner.  Patient also notes nonproductive cough.  Denies fever/chills, no recent sick contacts, no recent travel and no confirmed contact with individuals positive for Covid-19 viral infection.  In the ED, patient was noted to be hypoxic with oxygen saturations in the low 80s; and placed on supplemental oxygen.  Patient is afebrile without leukocytosis.  CT angiogram chest negative for pulmonary embolism but does note bilateral pleural effusions and cirrhotic appearing liver with multiple cysts and large cyst left upper quadrant measuring 6.3 x 5.1 cm.  BNP elevated 281.  Patient was given IV furosemide 40 mg.  Hospitalist service consulted for further evaluation and management.   Assessment & Plan:   Principal Problem:   Acute CHF (congestive heart failure) (HCC) Active Problems:   Cirrhosis of liver (HCC)   COPD with acute exacerbation (HCC)   Nicotine dependence, cigarettes, uncomplicated   Hyperkalemia   Hepatic cyst   Elevated troponin level not due myocardial infarction   Acute respiratory failure with hypoxia (HCC)   Acute hypoxic respiratory failure Patient presenting to the ED for progressive dyspnea over the last few months.  Patient was found to be hypoxic on presentation with SPO2 in the 80s.  Nonoxygen dependent at baseline.  Etiology likely multifactorial with increased lower extremity edema and elevated BNP consistent with congestive heart  failure exacerbation in conjunction with COPD exacerbation in conjunction with continued tobacco use disorder. --Continue supplemental oxygen, maintain SPO2 greater than 88% --Continue treatment as below --repeat ambulatory O2 screen today to assess further oxygen needs  Acute on chronic diastolic congestive heart failure Patient with worsening lower extremity edema over the past few months.  Elevated BNP. TTE with LVEF 55-60%, grade 1 diastolic dysfunction, RV systolic function mildly reduced. --wt 102.1>99.3>95.5>91.2>87kg --net negative 1.0L past 24h and net negative 9.4L since admission --Furosemide 40 PO daily --TED hose --Strict I's and O's and daily weights --Continue to monitor BMP daily while on aggressive IV diuresis  Acute COPD exacerbation Patient with wheezing and hypoxia on presentation.  Continues with tobacco abuse.  Not on oxygen at baseline. --Completed 5-day course of prednisone --Started Dulera 200-5 mcg 2 puffs twice daily --Albuterol nebs every 2 hours as needed for shortness of breath/wheezing --Continue supplemental oxygen, titrate to maintain SPO2 greater than 88%; currently 3 L nasal cannula with SPO2 96%  Hyperkalemia: Resolved Potassium 5.6 on admission, treated with Lokelma 10 mg x 1 on 08/23/2020.[name] --K 4.3 today --Continue monitor on telemetry --Follow electrolytes closely daily  Cirrhosis with multiple hepatic cysts Incidental finding on CT angiogram chest with cirrhotic liver with multiple small hepatic cysts.  Does not follow with gastroenterology. --Consider further outpatient work-up with GI referral versus MR abdomen with liver protocol  Nicotine dependence Counseled on need for complete cessation.  Tinea cruris Patient with redness to groin area, likely secondary to incontinence while on IV diuretics. --Nystatin powder twice daily to groin area  Weakness, deconditioning, gait disturbance: --PT currently recommending SNF; awaiting bed  offers --Continue therapy efforts while inpatient   DVT  prophylaxis: Lovenox Code Status: DNR Family Communication: No family present at bedside this morning; updated patient's son at bedside extensively yesterday  Disposition Plan:  Status is: Inpatient  Remains inpatient appropriate because:Unsafe d/c plan, IV treatments appropriate due to intensity of illness or inability to take PO and Inpatient level of care appropriate due to severity of illness   Dispo:  Patient From: Assisted Living Facility  Planned Disposition: Skilled Nursing Facility vs back [facility]  Expected discharge date: 08/27/2020  Medically stable for discharge: No   Consultants:   None  Procedures:   None  Antimicrobials:   None   Subjective: Patient seen and examined at bedside, resting comfortably in bed. No specific complaints this morning other than requesting a newspaper, more coffee and cookies. Continues on 2 L nasal cannula. Continues with good urine output, furosemide now D escalated from IV to 40 mg p.o. daily. No family present at bedside this morning, but updated extensively at bedside yesterday afternoon. Awaiting possible SNF placement. No other questions or concerns at this time. Denies headache, no visual changes, no chest pain, no palpitations, no abdominal pain, no fever/chills/night sweats, no nausea cefonicid/diarrhea.  No acute concerns overnight per nursing staff.  Objective: Vitals:   08/27/20 2100 08/27/20 2331 08/28/20 0403 08/28/20 0850  BP:  117/67 113/78   Pulse: 91 84 82   Resp: 18 18 16    Temp:  97.8 F (36.6 C) 97.6 F (36.4 C)   TempSrc:  Axillary Oral   SpO2: 97% 96% 94% 95%  Weight:   87 kg   Height:        Intake/Output Summary (Last 24 hours) at 08/28/2020 1020 Last data filed at 08/28/2020 0913 Gross per 24 hour  Intake 3 ml  Output 600 ml  Net -597 ml   Filed Weights   08/26/20 2251 08/27/20 0424 08/28/20 0403  Weight: 92.5 kg 92.5  kg 87 kg    Examination:  General exam: Appears calm and comfortable, slightly conversationally dyspneic Respiratory system: Decreased breath sounds bilateral bases with crackles, late expiratory wheezing bilaterally, normal respiratory effort without accessory muscle use, on 2 L nasal cannula with SPO2 98% Cardiovascular system: S1 & S2 heard, RRR. No JVD, murmurs, rubs, gallops or clicks.  1+ pitting edema bilateral lower extremities to mid shin, TED hose in place Gastrointestinal system: Abdomen is nondistended, soft and nontender. No organomegaly or masses felt. Normal bowel sounds heard. Central nervous system: Alert and oriented. No focal neurological deficits. Extremities: Symmetric 5 x 5 power. Skin: No rashes, lesions or ulcers Psychiatry: Judgement and insight appear poor. Mood & affect appropriate.     Data Reviewed: I have personally reviewed following labs and imaging studies  CBC: Recent Labs  Lab 08/22/20 1627 08/22/20 1640 08/23/20 0416  WBC 5.3  --  4.0  NEUTROABS 3.8  --  3.5  HGB 15.3 16.7 15.3  HCT 49.1 49.0 48.9  MCV 102.9*  --  101.9*  PLT 138*  --  135*   Basic Metabolic Panel: Recent Labs  Lab 08/23/20 0416 08/24/20 0240 08/25/20 0216 08/26/20 0248 08/27/20 0257 08/28/20 0326  NA 142 141 141 138 137 138  K 5.6* 4.2 3.9 3.8 3.9 4.3  CL 98 96* 92* 90* 88* 93*  CO2 33* 35* 41* 38* 38* 31  GLUCOSE 152* 127* 133* 83 114* 80  BUN 29* 34* 30* 22 30* 35*  CREATININE 1.22 1.00 0.91 0.87 0.98 1.00  CALCIUM 8.5* 8.1* 8.4* 8.5* 8.6*  8.6*  MG 2.1  --   --   --   --   --    GFR: Estimated Creatinine Clearance: 60.3 mL/min (by C-G formula based on SCr of 1 mg/dL). Liver Function Tests: Recent Labs  Lab 08/22/20 1627  AST 26  ALT 19  ALKPHOS 62  BILITOT 1.1  PROT 6.6  ALBUMIN 3.3*   No results for input(s): LIPASE, AMYLASE in the last 168 hours. No results for input(s): AMMONIA in the last 168 hours. Coagulation Profile: Recent Labs  Lab  08/23/20 0416  INR 1.1   Cardiac Enzymes: No results for input(s): CKTOTAL, CKMB, CKMBINDEX, TROPONINI in the last 168 hours. BNP (last 3 results) No results for input(s): PROBNP in the last 8760 hours. HbA1C: No results for input(s): HGBA1C in the last 72 hours. CBG: No results for input(s): GLUCAP in the last 168 hours. Lipid Profile: No results for input(s): CHOL, HDL, LDLCALC, TRIG, CHOLHDL, LDLDIRECT in the last 72 hours. Thyroid Function Tests: No results for input(s): TSH, T4TOTAL, FREET4, T3FREE, THYROIDAB in the last 72 hours. Anemia Panel: No results for input(s): VITAMINB12, FOLATE, FERRITIN, TIBC, IRON, RETICCTPCT in the last 72 hours. Sepsis Labs: No results for input(s): PROCALCITON, LATICACIDVEN in the last 168 hours.  Recent Results (from the past 240 hour(s))  Resp Panel by RT-PCR (Flu A&B, Covid) Nasopharyngeal Swab     Status: None   Collection Time: 08/22/20  4:27 PM   Specimen: Nasopharyngeal Swab; Nasopharyngeal(NP) swabs in vial transport medium  Result Value Ref Range Status   SARS Coronavirus 2 by RT PCR NEGATIVE NEGATIVE Final    Comment: (NOTE) SARS-CoV-2 target nucleic acids are NOT DETECTED.  The SARS-CoV-2 RNA is generally detectable in upper respiratory specimens during the acute phase of infection. The lowest concentration of SARS-CoV-2 viral copies this assay can detect is 138 copies/mL. A negative result does not preclude SARS-Cov-2 infection and should not be used as the sole basis for treatment or other patient management decisions. A negative result may occur with  improper specimen collection/handling, submission of specimen other than nasopharyngeal swab, presence of viral mutation(s) within the areas targeted by this assay, and inadequate number of viral copies(<138 copies/mL). A negative result must be combined with clinical observations, patient history, and epidemiological information. The expected result is Negative.  Fact Sheet  for Patients:  BloggerCourse.com  Fact Sheet for Healthcare Providers:  SeriousBroker.it  This test is no t yet approved or cleared by the [place] FDA and  has been authorized for detection and/or diagnosis of SARS-CoV-2 by FDA under an Emergency Use Authorization (EUA). This EUA will remain  in effect (meaning this test can be used) for the duration of the COVID-19 declaration under Section 564(b)(1) of the Act, 21 U.S.C.section 360bbb-3(b)(1), unless the authorization is terminated  or revoked sooner.       Influenza A by PCR NEGATIVE NEGATIVE Final   Influenza B by PCR NEGATIVE NEGATIVE Final    Comment: (NOTE) The Xpert Xpress SARS-CoV-2/FLU/RSV plus assay is intended as an aid in the diagnosis of influenza from Nasopharyngeal swab specimens and should not be used as a sole basis for treatment. Nasal washings and aspirates are unacceptable for Xpert Xpress SARS-CoV-2/FLU/RSV testing.  Fact Sheet for Patients: BloggerCourse.com  Fact Sheet for Healthcare Providers: SeriousBroker.it  This test is not yet approved or cleared by the [place] FDA and has been authorized for detection and/or diagnosis of SARS-CoV-2 by FDA under an Emergency Use Authorization (EUA). This EUA  will remain in effect (meaning this test can be used) for the duration of the COVID-19 declaration under Section 564(b)(1) of the Act, 21 U.S.C. section 360bbb-3(b)(1), unless the authorization is terminated or revoked.  Performed at [facility], [address] [place], [place] [address]          Radiology Studies: No results found.      Scheduled Meds: . enoxaparin (LOVENOX) injection  40 mg Subcutaneous Q24H  . feeding supplement  237 mL Oral BID BM  . [START ON 08/29/2020] furosemide  40 mg Oral Daily  . mometasone-formoterol  2 puff Inhalation BID  . nystatin   Topical  BID  . polyethylene glycol  17 g Oral BID  . sodium chloride flush  3 mL Intravenous Q12H   Continuous Infusions: . sodium chloride       LOS: 5 days    Time spent: 36 minutes spent on chart review, discussion with nursing staff, consultants, updating family and interview/physical exam; more than 50% of that time was spent in counseling and/or coordination of care.    Alvira Philips [place], DO [facility] Available via Epic secure chat 7am-7pm After these hours, please refer to coverage provider listed on [email or web address] 08/28/2020, 10:20 AM

## 2020-08-29 LAB — BASIC METABOLIC PANEL
Anion gap: 9 (ref 5–15)
BUN: 33 mg/dL — ABNORMAL HIGH (ref 8–23)
CO2: 36 mmol/L — ABNORMAL HIGH (ref 22–32)
Calcium: 8.5 mg/dL — ABNORMAL LOW (ref 8.9–10.3)
Chloride: 92 mmol/L — ABNORMAL LOW (ref 98–111)
Creatinine, Ser: 0.94 mg/dL (ref 0.61–1.24)
GFR, Estimated: >60 mL/min (ref >60)
Glucose, Bld: 109 mg/dL — ABNORMAL HIGH (ref 70–99)
Potassium: 4 mmol/L (ref 3.5–5.1)
Sodium: 137 mmol/L (ref 135–145)

## 2020-08-29 NOTE — TOC Progression Note (Signed)
Transition of Care [facility]) - Progression Note    Patient Details  Name: Hector Beltran MRN: [id number] Date of Birth: 06-Nov-1927  [facility] CM/SW Contact  Jimmy Picket, [place] Phone Number: 08/29/2020, 4:42 PM  Clinical Narrative:     CSW made multiple attempts to call pts son Luismanuel Corman about facility choice. CSW left messages and requested a call back. CSW also called [facility] and left a message. Pt stated he did not know why they weren't answering the phone and he did not know the decision of the facility choice.   CSW started insurance auth, reference number [id number].   Expected Discharge Plan: Assisted Living Barriers to Discharge: Continued Medical Work up  Expected Discharge Plan and Services Expected Discharge Plan: Assisted Living In-house Referral: Clinical Social Work     Living arrangements for the past 2 months: Assisted Living Facility Expected Discharge Date: 08/29/20                                     Social Determinants of Health (SDOH) Interventions    Readmission Risk Interventions No flowsheet data found.  Jimmy Picket, Theresia Majors, [place] Clinical Social Worker [phone]

## 2020-08-29 NOTE — Progress Notes (Signed)
PROGRESS NOTE    Hector Beltran  UYZ:[id number] DOB: August 16, 1928 DOA: 08/22/2020 PCP: Renford Dills, MD    Brief Narrative:  Hector Beltran is a 86 year old male with past medical history significant for COPD, peripheral neuropathy, chronic diastolic congestive heart failure, nicotine dependence who presented to [facility] ED with complaints of progressive shortness of breath and bilateral lower extremity swelling.  Now awaits SNF placement.    Assessment & Plan:   Principal Problem:   Acute CHF (congestive heart failure) (HCC) Active Problems:   Cirrhosis of liver (HCC)   COPD with acute exacerbation (HCC)   Nicotine dependence, cigarettes, uncomplicated   Hyperkalemia   Hepatic cyst   Elevated troponin level not due myocardial infarction   Acute respiratory failure with hypoxia (HCC)   Acute hypoxic respiratory failure Patient presenting to the ED for progressive dyspnea over the last few months.  Patient was found to be hypoxic on presentation with SPO2 in the 80s.  Nonoxygen dependent at baseline.  Etiology likely multifactorial with increased lower extremity edema and elevated BNP consistent with congestive heart failure exacerbation in conjunction with COPD exacerbation in conjunction with continued tobacco use disorder. --Continue supplemental oxygen, maintain SPO2 greater than 88% --Continue treatment as below  Acute on chronic diastolic congestive heart failure Patient with worsening lower extremity edema over the past few months.  Elevated BNP. TTE with LVEF 55-60%, grade 1 diastolic dysfunction, RV systolic function mildly reduced. --wt 102.1>99.3>95.5>91.2>87kg --Furosemide 40 PO daily --TED hose --Strict I's and O's and daily weights  Acute COPD exacerbation Patient with wheezing and hypoxia on presentation.  Continues with tobacco abuse.  Not on oxygen at baseline. --Completed 5-day course of prednisone --Started Dulera 200-5 mcg 2 puffs twice daily --Albuterol nebs every  2 hours as needed for shortness of breath/wheezing --Continue supplemental oxygen, titrate to maintain SPO2 greater than 88%  Hyperkalemia: Resolved  Cirrhosis with multiple hepatic cysts Incidental finding on CT angiogram chest with cirrhotic liver with multiple small hepatic cysts.  Does not follow with gastroenterology. --Consider further outpatient work-up with GI referral versus MR abdomen with liver protocol  Nicotine dependence Counseled on need for complete cessation.  Tinea cruris Patient with redness to groin area, likely secondary to incontinence while on IV diuretics. --Nystatin powder twice daily to groin area  Weakness, deconditioning, gait disturbance: --PT currently recommending SNF; awaiting bed offers --Continue therapy efforts while inpatient   DVT prophylaxis: Lovenox Code Status: DNR  Disposition Plan:  Status is: Inpatient  Remains inpatient appropriate because:Unsafe d/c plan, IV treatments appropriate due to intensity of illness or inability to take PO and Inpatient level of care appropriate due to severity of illness   Dispo:  Patient From: Home  Planned Disposition: Skilled Nursing Facility   Expected discharge date: 08/29/2020  Medically stable for discharge: yes-- await bed  Consultants:   None     Subjective: No overnight events  Objective: Vitals:   08/28/20 1653 08/28/20 2301 08/29/20 0517 08/29/20 0915  BP: (!) 147/73 114/69 106/72   Pulse: 72 89 86   Resp: 17 18 18    Temp: 98.3 F (36.8 C) 98.3 F (36.8 C) 98.4 F (36.9 C)   TempSrc: Oral Oral Oral   SpO2: 97% 94% 94% 92%  Weight:   88.7 kg   Height:        Intake/Output Summary (Last 24 hours) at 08/29/2020 1350 Last data filed at 08/29/2020 0525 Gross per 24 hour  Intake --  Output 600 ml  Net -600  ml   Filed Weights   08/27/20 0424 08/28/20 0403 08/29/20 0517  Weight: 92.5 kg 87 kg 88.7 kg    Examination:   General: Appearance:    elderly male in no acute  distress     Lungs:     [place], respirations unlabored  Heart:    Normal heart rate. Normal rhythm. No murmurs, rubs, or gallops.   MS:   All extremities are intact.   Neurologic:   Awake, alert     Data Reviewed: I have personally reviewed following labs and imaging studies  CBC: Recent Labs  Lab 08/22/20 1627 08/22/20 1640 08/23/20 0416  WBC 5.3  --  4.0  NEUTROABS 3.8  --  3.5  HGB 15.3 16.7 15.3  HCT 49.1 49.0 48.9  MCV 102.9*  --  101.9*  PLT 138*  --  135*   Basic Metabolic Panel: Recent Labs  Lab 08/23/20 0416 08/24/20 0240 08/25/20 0216 08/26/20 0248 08/27/20 0257 08/28/20 0326 08/29/20 0337  NA 142   < > 141 138 137 138 137  K 5.6*   < > 3.9 3.8 3.9 4.3 4.0  CL 98   < > 92* 90* 88* 93* 92*  CO2 33*   < > 41* 38* 38* 31 36*  GLUCOSE 152*   < > 133* 83 114* 80 109*  BUN 29*   < > 30* 22 30* 35* 33*  CREATININE 1.22   < > 0.91 0.87 0.98 1.00 0.94  CALCIUM 8.5*   < > 8.4* 8.5* 8.6* 8.6* 8.5*  MG 2.1  --   --   --   --   --   --    < > = values in this interval not displayed.   GFR: Estimated Creatinine Clearance: 64.1 mL/min (by C-G formula based on SCr of 0.94 mg/dL). Liver Function Tests: Recent Labs  Lab 08/22/20 1627  AST 26  ALT 19  ALKPHOS 62  BILITOT 1.1  PROT 6.6  ALBUMIN 3.3*   No results for input(s): LIPASE, AMYLASE in the last 168 hours. No results for input(s): AMMONIA in the last 168 hours. Coagulation Profile: Recent Labs  Lab 08/23/20 0416  INR 1.1   Cardiac Enzymes: No results for input(s): CKTOTAL, CKMB, CKMBINDEX, TROPONINI in the last 168 hours. BNP (last 3 results) No results for input(s): PROBNP in the last 8760 hours. HbA1C: No results for input(s): HGBA1C in the last 72 hours. CBG: No results for input(s): GLUCAP in the last 168 hours. Lipid Profile: No results for input(s): CHOL, HDL, LDLCALC, TRIG, CHOLHDL, LDLDIRECT in the last 72 hours. Thyroid Function Tests: No results for input(s): TSH, T4TOTAL, FREET4,  T3FREE, THYROIDAB in the last 72 hours. Anemia Panel: No results for input(s): VITAMINB12, FOLATE, FERRITIN, TIBC, IRON, RETICCTPCT in the last 72 hours. Sepsis Labs: No results for input(s): PROCALCITON, LATICACIDVEN in the last 168 hours.  Recent Results (from the past 240 hour(s))  Resp Panel by RT-PCR (Flu A&B, Covid) Nasopharyngeal Swab     Status: None   Collection Time: 08/22/20  4:27 PM   Specimen: Nasopharyngeal Swab; Nasopharyngeal(NP) swabs in vial transport medium  Result Value Ref Range Status   SARS Coronavirus 2 by RT PCR NEGATIVE NEGATIVE Final    Comment: (NOTE) SARS-CoV-2 target nucleic acids are NOT DETECTED.  The SARS-CoV-2 RNA is generally detectable in upper respiratory specimens during the acute phase of infection. The lowest concentration of SARS-CoV-2 viral copies this assay can detect is 138 copies/mL. A negative  result does not preclude SARS-Cov-2 infection and should not be used as the sole basis for treatment or other patient management decisions. A negative result may occur with  improper specimen collection/handling, submission of specimen other than nasopharyngeal swab, presence of viral mutation(s) within the areas targeted by this assay, and inadequate number of viral copies(<138 copies/mL). A negative result must be combined with clinical observations, patient history, and epidemiological information. The expected result is Negative.  Fact Sheet for Patients:  BloggerCourse.comhttps://www.fda.gov/media/152166/download  Fact Sheet for Healthcare Providers:  SeriousBroker.ithttps://www.fda.gov/media/152162/download  This test is no t yet approved or cleared by the [place] FDA and  has been authorized for detection and/or diagnosis of SARS-CoV-2 by FDA under an Emergency Use Authorization (EUA). This EUA will remain  in effect (meaning this test can be used) for the duration of the COVID-19 declaration under Section 564(b)(1) of the Act, 21 U.S.C.section 360bbb-3(b)(1),  unless the authorization is terminated  or revoked sooner.       Influenza A by PCR NEGATIVE NEGATIVE Final   Influenza B by PCR NEGATIVE NEGATIVE Final    Comment: (NOTE) The Xpert Xpress SARS-CoV-2/FLU/RSV plus assay is intended as an aid in the diagnosis of influenza from Nasopharyngeal swab specimens and should not be used as a sole basis for treatment. Nasal washings and aspirates are unacceptable for Xpert Xpress SARS-CoV-2/FLU/RSV testing.  Fact Sheet for Patients: BloggerCourse.comhttps://www.fda.gov/media/152166/download  Fact Sheet for Healthcare Providers: SeriousBroker.ithttps://www.fda.gov/media/152162/download  This test is not yet approved or cleared by the [place] FDA and has been authorized for detection and/or diagnosis of SARS-CoV-2 by FDA under an Emergency Use Authorization (EUA). This EUA will remain in effect (meaning this test can be used) for the duration of the COVID-19 declaration under Section 564(b)(1) of the Act, 21 U.S.C. section 360bbb-3(b)(1), unless the authorization is terminated or revoked.  Performed at [facility], [address] [place], [place] [address]   SARS Coronavirus 2 by RT PCR (hospital order, performed in [facility] hospital lab) Nasopharyngeal Nasopharyngeal Swab     Status: None   Collection Time: 08/28/20  3:32 PM   Specimen: Nasopharyngeal Swab  Result Value Ref Range Status   SARS Coronavirus 2 NEGATIVE NEGATIVE Final    Comment: (NOTE) SARS-CoV-2 target nucleic acids are NOT DETECTED.  The SARS-CoV-2 RNA is generally detectable in upper and lower respiratory specimens during the acute phase of infection. The lowest concentration of SARS-CoV-2 viral copies this assay can detect is 250 copies / mL. A negative result does not preclude SARS-CoV-2 infection and should not be used as the sole basis for treatment or other patient management decisions.  A negative result may occur with improper specimen collection / handling, submission of  specimen other than nasopharyngeal swab, presence of viral mutation(s) within the areas targeted by this assay, and inadequate number of viral copies (<250 copies / mL). A negative result must be combined with clinical observations, patient history, and epidemiological information.  Fact Sheet for Patients:   BoilerBrush.com.cyhttps://www.fda.gov/media/136312/download  Fact Sheet for Healthcare Providers: https://pope.com/https://www.fda.gov/media/136313/download  This test is not yet approved or  cleared by the [place] FDA and has been authorized for detection and/or diagnosis of SARS-CoV-2 by FDA under an Emergency Use Authorization (EUA).  This EUA will remain in effect (meaning this test can be used) for the duration of the COVID-19 declaration under Section 564(b)(1) of the Act, 21 U.S.C. section 360bbb-3(b)(1), unless the authorization is terminated or revoked sooner.  Performed at [facility], [address] [place], [place] [address]          Radiology Studies: No results found.      Scheduled Meds: . enoxaparin (LOVENOX) injection  40 mg Subcutaneous Q24H  . feeding supplement  237 mL Oral BID BM  . furosemide  40 mg Oral Daily  . mometasone-formoterol  2 puff Inhalation BID  . nystatin   Topical BID  . sodium chloride flush  3 mL Intravenous Q12H   Continuous Infusions: . sodium chloride       LOS: 6 days    Time spent: 35 minutes spent on chart review, discussion with nursing staff, consultants, updating family and interview/physical exam; more than 50% of that time was spent in counseling and/or coordination of care.    Joseph Art, DO [facility] Available via Epic secure chat 7am-7pm After these hours, please refer to coverage provider listed on [email or web address] 08/29/2020, 1:50 PM

## 2020-08-29 NOTE — Progress Notes (Signed)
Nutrition Follow-up  DOCUMENTATION CODES:   Not applicable  INTERVENTION:  Continue Ensure Enlive po BID, each supplement provides 350 kcal and 20 grams of protein  Encourage adequate PO intake.   NUTRITION DIAGNOSIS:   Increased nutrient needs related to chronic illness (COPD, CHF) as evidenced by estimated needs; ongoing  GOAL:   Patient will meet greater than or equal to 90% of their needs; met  MONITOR:   PO intake,Supplement acceptance,Skin,Weight trends,Labs,I & O's  REASON FOR ASSESSMENT:   Consult Assessment of nutrition requirement/status  ASSESSMENT:   86 year old male with past medical history significant for COPD, peripheral neuropathy, chronic diastolic congestive heart failure presents with progressive shortness of breath and bilateral lower extremity swelling.   Meal completion has been 100%. Pt with good appetite and has been tolerating his po well. Pt currently on 2 L nasal cannula. Pt currently has Ensure ordered and has been consuming them. Per MD, pt medically stable currently awaiting SNF placement.   Labs and medications reviewed.   Diet Order:   Diet Order            Diet Heart Room service appropriate? Yes; Fluid consistency: Thin  Diet effective now                 EDUCATION NEEDS:   Not appropriate for education at this time  Skin:  Skin Assessment: Reviewed RN Assessment  Last BM:  12/14  Height:   Ht Readings from Last 1 Encounters:  08/22/20 _0  (1.93 m)    Weight:   Wt Readings from Last 1 Encounters:  08/29/20 88.7 kg   BMI:  Body mass index is 23.8 kg/m.  Estimated Nutritional Needs:   Kcal:  8721-5872  Protein:  95-105 grams  Fluid:  1.9 L/day  Corrin Parker, MS, RD, LDN RD pager number/after hours weekend pager number on Amion.

## 2020-08-30 MED ORDER — DOCUSATE SODIUM 100 MG PO CAPS
100.0000 mg | ORAL_CAPSULE | Freq: Every day | ORAL | Status: DC
  Administered 2020-08-30 – 2020-08-31 (×2): 100 mg via ORAL
  Filled 2020-08-30 (×2): qty 1

## 2020-08-30 NOTE — TOC Progression Note (Signed)
Transition of Care [facility]) - Progression Note    Patient Details  Name: Hector Beltran MRN: [id number] Date of Birth: Aug 14, 1928  [facility] CM/SW Contact  Jimmy Picket, [place] Phone Number: 08/30/2020, 10:52 AM  Clinical Narrative:     Pts son returned call. [name] inquired about two additional SNFs, [address] [facility] and [facility]. CSW sent request to those facilities and contacted admissions coordinators to review.   Expected Discharge Plan: Assisted Living Barriers to Discharge: Continued Medical Work up  Expected Discharge Plan and Services Expected Discharge Plan: Assisted Living In-house Referral: Clinical Social Work     Living arrangements for the past 2 months: Assisted Living Facility Expected Discharge Date: 08/29/20                                     Social Determinants of Health (SDOH) Interventions    Readmission Risk Interventions No flowsheet data found.  Jimmy Picket, Theresia Majors, [place] Clinical Social Worker [phone]

## 2020-08-30 NOTE — Progress Notes (Signed)
PROGRESS NOTE    Hector Beltran  NWG:[id number] DOB: 10-16-1927 DOA: 08/22/2020 PCP: Renford DillsPolite, Ronald, MD    Brief Narrative:  Hector Beltran is a 86 year old male with past medical history significant for COPD, peripheral neuropathy, chronic diastolic congestive heart failure, nicotine dependence who presented to [facility] ED with complaints of progressive shortness of breath and bilateral lower extremity swelling.  Now awaits SNF placement.    Assessment & Plan:   Principal Problem:   Acute CHF (congestive heart failure) (HCC) Active Problems:   Cirrhosis of liver (HCC)   COPD with acute exacerbation (HCC)   Nicotine dependence, cigarettes, uncomplicated   Hyperkalemia   Hepatic cyst   Elevated troponin level not due myocardial infarction   Acute respiratory failure with hypoxia (HCC)   Acute hypoxic respiratory failure Patient presenting to the ED for progressive dyspnea over the last few months.  Patient was found to be hypoxic on presentation with SPO2 in the 80s.  Nonoxygen dependent at baseline.  Etiology likely multifactorial with increased lower extremity edema and elevated BNP consistent with congestive heart failure exacerbation in conjunction with COPD exacerbation in conjunction with continued tobacco use disorder. --Continue supplemental oxygen, maintain SPO2 greater than 88% --Continue treatment as below  Acute on chronic diastolic congestive heart failure Patient with worsening lower extremity edema over the past few months.  Elevated BNP. TTE with LVEF 55-60%, grade 1 diastolic dysfunction, RV systolic function mildly reduced. --Furosemide 40 PO daily --TED hose --Strict I's and O's and daily weights  Acute COPD exacerbation Patient with wheezing and hypoxia on presentation.  Continues with tobacco abuse.  Not on oxygen at baseline. --Completed 5-day course of prednisone --Started Dulera 200-5 mcg 2 puffs twice daily --Albuterol nebs every 2 hours as needed for shortness  of breath/wheezing --Continue supplemental oxygen, titrate to maintain SPO2 greater than 88%  Hyperkalemia: Resolved  Cirrhosis with multiple hepatic cysts Incidental finding on CT angiogram chest with cirrhotic liver with multiple small hepatic cysts.  Does not follow with gastroenterology. --Consider further outpatient work-up with GI referral versus MR abdomen with liver protocol  Nicotine dependence Counseled on need for complete cessation.  Tinea cruris Patient with redness to groin area, likely secondary to incontinence while on IV diuretics. --Nystatin powder twice daily to groin area  Weakness, deconditioning, gait disturbance: --PT currently recommending SNF; awaiting bed offers --Continue therapy efforts while inpatient   DVT prophylaxis: Lovenox Code Status: DNR  Disposition Plan:  Status is: Inpatient  Remains inpatient appropriate because:Unsafe d/c plan, IV treatments appropriate due to intensity of illness or inability to take PO and Inpatient level of care appropriate due to severity of illness   Dispo:  Patient From: Home  Planned Disposition: Skilled Nursing Facility   Expected discharge date: 08/29/2020  Medically stable for discharge: yes-- await bed  Consultants:   None     Subjective: No overnight events  Objective: Vitals:   08/29/20 1215 08/29/20 1818 08/29/20 2350 08/30/20 0551  BP: 102/65 (!) 113/52 116/67 123/69  Pulse: 99 98 96 97  Resp: 16 16 18 18   Temp: 98.5 F (36.9 C) 98 F (36.7 C) 99.2 F (37.3 C) 98.9 F (37.2 C)  TempSrc: Oral Oral Oral Oral  SpO2: 94% 96% 95% 94%  Weight:    87.5 kg  Height:        Intake/Output Summary (Last 24 hours) at 08/30/2020 1127 Last data filed at 08/30/2020 1000 Gross per 24 hour  Intake --  Output 2000 ml  Net -  2000 ml   Filed Weights   08/28/20 0403 08/29/20 0517 08/30/20 0551  Weight: 87 kg 88.7 kg 87.5 kg    Examination:   General: Appearance:    Well developed, well  nourished male in no acute distress     Lungs:     Clear to auscultation bilaterally, respirations unlabored  Heart:    Normal heart rate. Normal rhythm. No murmurs, rubs, or gallops.   MS:   All extremities are intact.   Neurologic:   Awake, alert      Data Reviewed: I have personally reviewed following labs and imaging studies  CBC: No results for input(s): WBC, NEUTROABS, HGB, HCT, MCV, PLT in the last 168 hours. Basic Metabolic Panel: Recent Labs  Lab 08/25/20 0216 08/26/20 0248 08/27/20 0257 08/28/20 0326 08/29/20 0337  NA 141 138 137 138 137  K 3.9 3.8 3.9 4.3 4.0  CL 92* 90* 88* 93* 92*  CO2 41* 38* 38* 31 36*  GLUCOSE 133* 83 114* 80 109*  BUN 30* 22 30* 35* 33*  CREATININE 0.91 0.87 0.98 1.00 0.94  CALCIUM 8.4* 8.5* 8.6* 8.6* 8.5*   GFR: Estimated Creatinine Clearance: 64.1 mL/min (by C-G formula based on SCr of 0.94 mg/dL). Liver Function Tests: No results for input(s): AST, ALT, ALKPHOS, BILITOT, PROT, ALBUMIN in the last 168 hours. No results for input(s): LIPASE, AMYLASE in the last 168 hours. No results for input(s): AMMONIA in the last 168 hours. Coagulation Profile: No results for input(s): INR, PROTIME in the last 168 hours. Cardiac Enzymes: No results for input(s): CKTOTAL, CKMB, CKMBINDEX, TROPONINI in the last 168 hours. BNP (last 3 results) No results for input(s): PROBNP in the last 8760 hours. HbA1C: No results for input(s): HGBA1C in the last 72 hours. CBG: No results for input(s): GLUCAP in the last 168 hours. Lipid Profile: No results for input(s): CHOL, HDL, LDLCALC, TRIG, CHOLHDL, LDLDIRECT in the last 72 hours. Thyroid Function Tests: No results for input(s): TSH, T4TOTAL, FREET4, T3FREE, THYROIDAB in the last 72 hours. Anemia Panel: No results for input(s): VITAMINB12, FOLATE, FERRITIN, TIBC, IRON, RETICCTPCT in the last 72 hours. Sepsis Labs: No results for input(s): PROCALCITON, LATICACIDVEN in the last 168 hours.  Recent Results  (from the past 240 hour(s))  Resp Panel by RT-PCR (Flu A&B, Covid) Nasopharyngeal Swab     Status: None   Collection Time: 08/22/20  4:27 PM   Specimen: Nasopharyngeal Swab; Nasopharyngeal(NP) swabs in vial transport medium  Result Value Ref Range Status   SARS Coronavirus 2 by RT PCR NEGATIVE NEGATIVE Final    Comment: (NOTE) SARS-CoV-2 target nucleic acids are NOT DETECTED.  The SARS-CoV-2 RNA is generally detectable in upper respiratory specimens during the acute phase of infection. The lowest concentration of SARS-CoV-2 viral copies this assay can detect is 138 copies/mL. A negative result does not preclude SARS-Cov-2 infection and should not be used as the sole basis for treatment or other patient management decisions. A negative result may occur with  improper specimen collection/handling, submission of specimen other than nasopharyngeal swab, presence of viral mutation(s) within the areas targeted by this assay, and inadequate number of viral copies(<138 copies/mL). A negative result must be combined with clinical observations, patient history, and epidemiological information. The expected result is Negative.  Fact Sheet for Patients:  BloggerCourse.com  Fact Sheet for Healthcare Providers:  SeriousBroker.it  This test is no t yet approved or cleared by the [place] FDA and  has been authorized for detection and/or diagnosis  of SARS-CoV-2 by FDA under an Emergency Use Authorization (EUA). This EUA will remain  in effect (meaning this test can be used) for the duration of the COVID-19 declaration under Section 564(b)(1) of the Act, 21 U.S.C.section 360bbb-3(b)(1), unless the authorization is terminated  or revoked sooner.       Influenza A by PCR NEGATIVE NEGATIVE Final   Influenza B by PCR NEGATIVE NEGATIVE Final    Comment: (NOTE) The Xpert Xpress SARS-CoV-2/FLU/RSV plus assay is intended as an aid in the  diagnosis of influenza from Nasopharyngeal swab specimens and should not be used as a sole basis for treatment. Nasal washings and aspirates are unacceptable for Xpert Xpress SARS-CoV-2/FLU/RSV testing.  Fact Sheet for Patients: BloggerCourse.com  Fact Sheet for Healthcare Providers: SeriousBroker.it  This test is not yet approved or cleared by the [place] FDA and has been authorized for detection and/or diagnosis of SARS-CoV-2 by FDA under an Emergency Use Authorization (EUA). This EUA will remain in effect (meaning this test can be used) for the duration of the COVID-19 declaration under Section 564(b)(1) of the Act, 21 U.S.C. section 360bbb-3(b)(1), unless the authorization is terminated or revoked.  Performed at [facility], [address], [place], [place] [address]   SARS Coronavirus 2 by RT PCR (hospital order, performed in [facility] hospital lab) Nasopharyngeal Nasopharyngeal Swab     Status: None   Collection Time: 08/28/20  3:32 PM   Specimen: Nasopharyngeal Swab  Result Value Ref Range Status   SARS Coronavirus 2 NEGATIVE NEGATIVE Final    Comment: (NOTE) SARS-CoV-2 target nucleic acids are NOT DETECTED.  The SARS-CoV-2 RNA is generally detectable in upper and lower respiratory specimens during the acute phase of infection. The lowest concentration of SARS-CoV-2 viral copies this assay can detect is 250 copies / mL. A negative result does not preclude SARS-CoV-2 infection and should not be used as the sole basis for treatment or other patient management decisions.  A negative result may occur with improper specimen collection / handling, submission of specimen other than nasopharyngeal swab, presence of viral mutation(s) within the areas targeted by this assay, and inadequate number of viral copies (<250 copies / mL). A negative result must be combined with clinical observations, patient history, and  epidemiological information.  Fact Sheet for Patients:   BoilerBrush.com.cy  Fact Sheet for Healthcare Providers: https://pope.com/  This test is not yet approved or  cleared by the [place] FDA and has been authorized for detection and/or diagnosis of SARS-CoV-2 by FDA under an Emergency Use Authorization (EUA).  This EUA will remain in effect (meaning this test can be used) for the duration of the COVID-19 declaration under Section 564(b)(1) of the Act, 21 U.S.C. section 360bbb-3(b)(1), unless the authorization is terminated or revoked sooner.  Performed at [facility], [address], [place], [place] [address]          Radiology Studies: No results found.      Scheduled Meds: . docusate sodium  100 mg Oral Daily  . enoxaparin (LOVENOX) injection  40 mg Subcutaneous Q24H  . feeding supplement  237 mL Oral BID BM  . furosemide  40 mg Oral Daily  . mometasone-formoterol  2 puff Inhalation BID  . nystatin   Topical BID  . sodium chloride flush  3 mL Intravenous Q12H   Continuous Infusions: . sodium chloride       LOS: 7 days     Joseph Art, DO [facility] Available  via Epic secure chat 7am-7pm After these hours, please refer to coverage provider listed on [email or web address] 08/30/2020, 11:27 AM

## 2020-08-30 NOTE — TOC Progression Note (Signed)
Transition of Care [facility]) - Progression Note    Patient Details  Name: Hector Beltran MRN: [id number] Date of Birth: [birth year]  Transition of Care [facility]) CM/SW Contact  Jimmy Picket, [place] Phone Number: 08/30/2020, 4:52 PM  Clinical Narrative:     Pts [name]. CSW confirmed that they can take him at discharge. Pts last covid test was 12/14, admissions coordinator stated pt would not need a new covid test.   Currently waiting on insurance auth  Expected Discharge Plan: Assisted Living Barriers to Discharge: Continued Medical Work up  Expected Discharge Plan and Services Expected Discharge Plan: Assisted Living In-house Referral: Clinical Social Work     Living arrangements for the past 2 months: Assisted Living Facility Expected Discharge Date: 08/29/20                                     Social Determinants of Health (SDOH) Interventions    Readmission Risk Interventions No flowsheet data found.  Jimmy Picket, Theresia Majors, [place] Clinical Social Worker [phone]

## 2020-08-30 NOTE — Care Management Important Message (Signed)
Important Message  Patient Details  Name: Hector Beltran MRN: [id number] Date of Birth: 1928/05/20   Medicare Important Message Given:  Yes     Maribell Demeo P Fia Hebert 08/30/2020, 10:59 AM

## 2020-08-30 NOTE — Progress Notes (Signed)
Physical Therapy Treatment Patient Details Name: Hector Beltran MRN: [id number] DOB: 09/15/1928 Today's Date: 08/30/2020    History of Present Illness 86yo male c/o progressive SOB and BLE edema. Had a fall on 12/8 when trying to stand up from the toilet. Found to be hypoxic in the ED. PE negative. Admitted with acute CHF and acute respiratory failure with hypoxia. PMH gait abnormality, peripheral neuropathy, COPD, nicotene dependence    PT Comments    Pt continues to have decreased safety awareness requiring increased cues for transfer techniques, balance, and RW use.  Requiring min A at times.  Pt's O2 sats stable when on 2 L O2.  Continue plan of care   Follow Up Recommendations  SNF;Supervision/Assistance - 24 hour     Equipment Recommendations  Rolling walker with 5" wheels;3in1 (PT)    Recommendations for Other Services       Precautions / Restrictions Precautions Precautions: Fall    Mobility  Bed Mobility Overal bed mobility: Needs Assistance Bed Mobility: Supine to Sit     Supine to sit: Min guard     General bed mobility comments: increased time  Transfers Overall transfer level: Needs assistance Equipment used: Rolling walker (2 wheeled) Transfers: Sit to/from Stand Sit to Stand: Min guard         General transfer comment: Min guard for steadying; performed x 4 throughout session with RW  Ambulation/Gait Ambulation/Gait assistance: Min assist Gait Distance (Feet): 100 Feet Assistive device: Rolling walker (2 wheeled) Gait Pattern/deviations: Step-to pattern;Decreased stride length;Decreased weight shift to right;Decreased dorsiflexion - right;Decreased dorsiflexion - left;Shuffle;Trunk flexed Gait velocity: Decreased   General Gait Details: Poor safety with RW too far forward.  Frequent cues and assist for RW proximity.  Limited due to fatigue and incontinence.   Stairs             Wheelchair Mobility    Modified Rankin (Stroke Patients  Only)       Balance Overall balance assessment: Needs assistance Sitting-balance support: No upper extremity supported Sitting balance-Leahy Scale: Good     Standing balance support: Bilateral upper extremity supported;During functional activity Standing balance-Leahy Scale: Poor Standing balance comment: Reliant on BUE Support                            Cognition Arousal/Alertness: Awake/alert Behavior During Therapy: WFL for tasks assessed/performed   Area of Impairment: Memory;Problem solving;Safety/judgement                     Memory: Decreased short-term memory   Safety/Judgement: Decreased awareness of safety;Decreased awareness of deficits   Problem Solving: Slow processing;Decreased initiation General Comments: Pt with mumbled speech and decreased awarness of safety.  Pt incontinent and laying in wet bed - unaware.  Difficulty following commands.      Exercises      General Comments General comments (skin integrity, edema, etc.):  At arrival O2 out of nose and sats 83%.  Place [place] in nose and sats quickly up to 95%.  Maintained >90% with ambulationon 2 L O2.  At arrival, pt's bed was soaked with urine - external catheter not in place.  Assisted pt up, with new gown and helped to wash back and legs.  Pt had BM while at EOB, assisted again with cleaning.  Pt positioned in chair and nursing staff in to change bed and replace external catheter.       Pertinent Vitals/Pain Pain Assessment: No/denies  pain    Home Living                      Prior Function            PT Goals (current goals can now be found in the care plan section) Acute Rehab PT Goals Patient Stated Goal: to get back to what he normally would be doing PT Goal Formulation: With patient Time For Goal Achievement: 09/06/20 Potential to Achieve Goals: Fair Progress towards PT goals: Progressing toward goals    Frequency    Min 2X/week      PT Plan Frequency  needs to be updated    Co-evaluation              AM-PAC PT "6 Clicks" Mobility   Outcome Measure  Help needed turning from your back to your side while in a flat bed without using bedrails?: None Help needed moving from lying on your back to sitting on the side of a flat bed without using bedrails?: A Little Help needed moving to and from a bed to a chair (including a wheelchair)?: A Little Help needed standing up from a chair using your arms (e.g., wheelchair or bedside chair)?: A Little Help needed to walk in hospital room?: A Little Help needed climbing 3-5 steps with a railing? : A Lot 6 Click Score: 18    End of Session Equipment Utilized During Treatment: Gait belt;Oxygen Activity Tolerance: Patient tolerated treatment well Patient left: in chair;with call bell/phone within reach;with nursing/sitter in room;with chair alarm set Nurse Communication: Mobility status PT Visit Diagnosis: Unsteadiness on feet (R26.81);Muscle weakness (generalized) (M62.81);History of falling (Z91.81);Difficulty in walking, not elsewhere classified (R26.2)     Time: 0355-9741 PT Time Calculation (min) (ACUTE ONLY): 31 min  Charges:  $Gait Training: 8-22 mins $Therapeutic Activity: 8-22 mins                     Anise Salvo, PT Acute Rehab Services Pager [phone] [facility] [phone]     Rayetta Humphrey 08/30/2020, 4:04 PM

## 2020-08-31 DIAGNOSIS — Z743 Need for continuous supervision: Secondary | ICD-10-CM | POA: Diagnosis not present

## 2020-08-31 DIAGNOSIS — R2681 Unsteadiness on feet: Secondary | ICD-10-CM | POA: Diagnosis not present

## 2020-08-31 DIAGNOSIS — R0902 Hypoxemia: Secondary | ICD-10-CM

## 2020-08-31 DIAGNOSIS — I509 Heart failure, unspecified: Secondary | ICD-10-CM | POA: Diagnosis not present

## 2020-08-31 DIAGNOSIS — R0602 Shortness of breath: Secondary | ICD-10-CM | POA: Diagnosis not present

## 2020-08-31 DIAGNOSIS — Z7401 Bed confinement status: Secondary | ICD-10-CM | POA: Diagnosis not present

## 2020-08-31 DIAGNOSIS — J449 Chronic obstructive pulmonary disease, unspecified: Secondary | ICD-10-CM | POA: Diagnosis not present

## 2020-08-31 DIAGNOSIS — Q446 Cystic disease of liver: Secondary | ICD-10-CM | POA: Diagnosis not present

## 2020-08-31 DIAGNOSIS — K7460 Unspecified cirrhosis of liver: Secondary | ICD-10-CM | POA: Diagnosis not present

## 2020-08-31 DIAGNOSIS — M2550 Pain in unspecified joint: Secondary | ICD-10-CM | POA: Diagnosis not present

## 2020-08-31 DIAGNOSIS — I5033 Acute on chronic diastolic (congestive) heart failure: Secondary | ICD-10-CM | POA: Diagnosis not present

## 2020-08-31 DIAGNOSIS — M6281 Muscle weakness (generalized): Secondary | ICD-10-CM | POA: Diagnosis not present

## 2020-08-31 DIAGNOSIS — R627 Adult failure to thrive: Secondary | ICD-10-CM | POA: Diagnosis not present

## 2020-08-31 DIAGNOSIS — R278 Other lack of coordination: Secondary | ICD-10-CM | POA: Diagnosis not present

## 2020-08-31 DIAGNOSIS — J9601 Acute respiratory failure with hypoxia: Secondary | ICD-10-CM | POA: Diagnosis not present

## 2020-08-31 DIAGNOSIS — I5021 Acute systolic (congestive) heart failure: Secondary | ICD-10-CM | POA: Diagnosis not present

## 2020-08-31 DIAGNOSIS — R2243 Localized swelling, mass and lump, lower limb, bilateral: Secondary | ICD-10-CM | POA: Diagnosis not present

## 2020-08-31 DIAGNOSIS — J029 Acute pharyngitis, unspecified: Secondary | ICD-10-CM | POA: Diagnosis not present

## 2020-08-31 DIAGNOSIS — J441 Chronic obstructive pulmonary disease with (acute) exacerbation: Secondary | ICD-10-CM | POA: Diagnosis not present

## 2020-08-31 DIAGNOSIS — E875 Hyperkalemia: Secondary | ICD-10-CM | POA: Diagnosis not present

## 2020-08-31 MED ORDER — ALBUTEROL SULFATE (2.5 MG/3ML) 0.083% IN NEBU: 2.5000 mg | INHALATION_SOLUTION | RESPIRATORY_TRACT | 12 refills | Status: DC | PRN

## 2020-08-31 MED ORDER — ENSURE ENLIVE PO LIQD: 237.0000 mL | Freq: Two times a day (BID) | ORAL | 12 refills | Status: DC

## 2020-08-31 MED ORDER — NYSTATIN 100000 UNIT/GM EX POWD: Freq: Two times a day (BID) | CUTANEOUS | 0 refills | Status: DC

## 2020-08-31 MED ORDER — MOMETASONE FURO-FORMOTEROL FUM 200-5 MCG/ACT IN AERO: 2.0000 | INHALATION_SPRAY | Freq: Two times a day (BID) | RESPIRATORY_TRACT | Status: DC

## 2020-08-31 MED ORDER — DOCUSATE SODIUM 100 MG PO CAPS: 100.0000 mg | ORAL_CAPSULE | Freq: Every day | ORAL | 0 refills | Status: DC

## 2020-08-31 MED ORDER — FUROSEMIDE 40 MG PO TABS: 40.0000 mg | ORAL_TABLET | Freq: Every day | ORAL | Status: DC

## 2020-08-31 NOTE — Discharge Summary (Signed)
Physician Discharge Summary  Hector Beltran ZOX:[id number] DOB: [birth year] DOA: 08/22/2020  PCP: Renford Dills, MD  Admit date: 08/22/2020 Discharge date: 08/31/2020  Admitted From: ALF Discharge disposition: SNF   Recommendations for Outpatient Follow-Up:   1. BMP 1 week 2. Daily weights 3. I/Os 4. O2 wean as tolerated   Discharge Diagnosis:   Principal Problem:   Acute CHF (congestive heart failure) (HCC) Active Problems:   Cirrhosis of liver (HCC)   COPD with acute exacerbation (HCC)   Nicotine dependence, cigarettes, uncomplicated   Hyperkalemia   Hepatic cyst   Elevated troponin level not due myocardial infarction   Acute respiratory failure with hypoxia [facility])    Discharge Condition: Improved.  Diet recommendation: Low sodium, heart healthy  Wound care: apply powder to groin  Code status: DNR   History of Present Illness:   86 year old male with past medical history of peripheral neuropathy, COPD and nicotine dependence who presents to [facility] emergency department with complaints of shortness of breath.  Patient explains that for the past several months he has been experiencing bilateral lower extremity swelling.  He thought that it was due to his previous diagnosis of neuropathy and states that when he had his annual checkup with his primary care provider several months ago he was told that "everything was fine."  As the months progressed patient's bilateral lower extremity swelling progressively worsened.  Patient explains that shortly after Thanksgiving, he noticed that he began to develop shortness of breath.  The shortness of breath was initially mild in intensity but has become progressively more more severe.  Shortness breath is worse with exertion and improved with rest.  Is that over the span of time he has also noticed that he needs to sleep in a recliner.  Patient reports becoming short of breath when lying flat but denies  paroxysmal nocturnal dyspnea.  Patient denies any associated chest pain.  Patient does complain of increasing cough over the same span of time.  Patient states that his cough is productive but is unable to describe the color.  Patient denies any fever, sick contact, recent travel or confirmed contact with COVID-19 infection.  Of note, patient is vaccinated for COVID-19.  Patient symptoms of peripheral edema cough and shortness of breath continued to worsen.  This morning, the patient was attempting to push himself up off of his toilet his hand slipped and he fell to the ground.  Patient denies any loss of consciousness or head trauma.  EMS was then called and the patient was brought into [facility] emergency department for evaluation.  Upon evaluation in the emergency department, patient was found to be hypoxic with oxygen saturations in the in the low 80s.  Patient was placed on supplemental oxygen, now on 3 L of oxygen via nasal cannula.  Patient underwent CT angiogram of the chest which revealed no evidence of pulmonary embolism but did reveal bilateral pleural effusions as well as an incidental finding of a cirrhotic liver and 6.3 x 5.1 cm cyst in the left upper quadrant of the liver with MRI recommended by radiology.  Patient was additionally found to have an elevated BNP of 281.  Patient was administered a dose of 40 mg of IV Lasix due to concerns for acute congestive heart failure.  The hospitalist group was then called to assess patient for admission to the hospital.   Hospital Course by Problem:   Acute hypoxic respiratory failure Patient presenting to the ED  for progressive dyspnea over the last few months.  Patient was found to be hypoxic on presentation with SPO2 in the 80s.  Nonoxygen dependent at baseline.  Etiology likely multifactorial with increased lower extremity edema and elevated BNP consistent with congestive heart failure exacerbation in conjunction with COPD  exacerbation in conjunction with continued tobacco use disorder. --Continue supplemental oxygen, maintain SPO2 greater than 88% --Continue treatment as below  Acute on chronic diastolic congestive heart failure Patient with worsening lower extremity edema over the past few months.  Elevated BNP. TTE with LVEF 55-60%, grade 1 diastolic dysfunction, RV systolic function mildly reduced. --Furosemide 40 PO daily --TED hose --Strict I's and O's and daily weights  Acute COPD exacerbation Patient with wheezing and hypoxia on presentation.  Continues with tobacco abuse.  Not on oxygen at baseline. --Completed 5-day course of prednisone --Started Dulera 200-5 mcg 2 puffs twice daily --Albuterol nebs every 2 hours as needed for shortness of breath/wheezing --Continue supplemental oxygen, titrate to maintain SPO2 greater than 88%  Hyperkalemia: Resolved  Cirrhosis with multiple hepatic cysts Incidental finding on CT angiogram chest with cirrhotic liver with multiple small hepatic cysts.  Does not follow with gastroenterology. --Consider further outpatient work-up with GI referral versus MR abdomen with liver protocol  Nicotine dependence Counseled on need for complete cessation.  Tinea cruris Patient with redness to groin area, likely secondary to incontinence --Nystatin powder twice daily to groin area  Weakness, deconditioning, gait disturbance: --PT currently recommending SNF      Medical Consultants:      Discharge Exam:   Vitals:   08/31/20 0536 08/31/20 0755  BP: 123/79   Pulse: 63   Resp: 18   Temp: 98.1 F (36.7 C)   SpO2: 98% 97%   Vitals:   08/30/20 1645 08/31/20 0058 08/31/20 0536 08/31/20 0755  BP: 118/68 125/68 123/79   Pulse: 95 83 63   Resp: 18 18 18    Temp: 98.9 F (37.2 C) 97.7 F (36.5 C) 98.1 F (36.7 C)   TempSrc: Oral Oral Oral   SpO2: 96% 98% 98% 97%  Weight:   87.4 kg   Height:        General exam: Appears calm and comfortable.    The results of significant diagnostics from this hospitalization (including imaging, microbiology, ancillary and laboratory) are listed below for reference.     Procedures and Diagnostic Studies:   CT Angio Chest PE W and/or Wo Contrast  Result Date: 08/22/2020 CLINICAL DATA:  Shortness of breath. EXAM: CT ANGIOGRAPHY CHEST WITH CONTRAST TECHNIQUE: Multidetector CT imaging of the chest was performed using the standard protocol during bolus administration of intravenous contrast. Multiplanar CT image reconstructions and MIPs were obtained to evaluate the vascular anatomy. CONTRAST:  68mL OMNIPAQUE IOHEXOL 350 MG/ML SOLN COMPARISON:  None. FINDINGS: Cardiovascular: There is mild calcification of the aortic arch. Satisfactory opacification of the pulmonary arteries to the segmental level. No evidence of pulmonary embolism. There is moderate severity cardiomegaly with moderate to marked severity coronary artery calcification. No pericardial effusion. Mediastinum/Nodes: No enlarged mediastinal, hilar, or axillary lymph nodes. Thyroid gland, trachea, and esophagus demonstrate no significant findings. Lungs/Pleura: Mild biapical scarring and/or atelectasis is seen. Mild areas of atelectasis and/or infiltrate are noted within the bilateral lung bases. There is a small right pleural effusion with small partially loculated components. A very small left pleural effusion is also seen. No pneumothorax is identified. Upper Abdomen: The liver is cirrhotic in appearance. A 1.5 cm x 1.3 cm cystic appearing  area is seen within the left lobe of the liver. Similar appearing 1.3 cm x 0.6 cm and 1.3 cm x 1.2 cm areas of low attenuation are seen within the right lobe. A mild to moderate amount of perihepatic and perisplenic fluid is noted. A 6.3 cm x 5.1 cm cyst is seen within the left upper quadrant (incompletely imaged). Musculoskeletal: Marked severity multilevel degenerative changes seen throughout the thoracic spine.  Review of the MIP images confirms the above findings. IMPRESSION: 1. No evidence of pulmonary embolism. 2. Mild bibasilar atelectasis and/or infiltrate. 3. Small bilateral pleural effusions with partially loculated components on the right. 4. Moderate severity cardiomegaly with moderate to marked severity coronary artery calcification. 5. Cirrhotic liver with multiple small hepatic cysts and/or hemangiomas. Correlation with MRI is recommended. 6. 6.3 cm x 5.1 cm cyst within the left upper quadrant (incompletely imaged). Further correlation with nonemergent MRI of the abdomen is recommended to further determine its origin. 7. Aortic atherosclerosis. Aortic Atherosclerosis (ICD10-I70.0). Electronically Signed   By: Aram Candela M.D.   On: 08/22/2020 18:08   DG Chest Port 1 View  Result Date: 08/22/2020 CLINICAL DATA:  Shortness of breath. EXAM: PORTABLE CHEST 1 VIEW COMPARISON:  08/21/2020. FINDINGS: Small right pleural effusion. Mild diffuse interstitial prominence. Right basilar opacities. No visible pneumothorax. Mildly enlarged cardiac silhouette. No acute osseous abnormality. IMPRESSION: 1. Mild diffuse interstitial prominence, which may represent mild interstitial edema. 2. Small right pleural effusion with overlying right basilar opacities, which could represent atelectasis, aspiration, and/or pneumonia. Electronically Signed   By: Feliberto Harts MD   On: 08/22/2020 16:41   ECHOCARDIOGRAM COMPLETE  Result Date: 08/23/2020    ECHOCARDIOGRAM REPORT   Patient Name:   FLOYDE DINGLEY Date of Exam: 08/23/2020 Medical Rec #:  [id number]     Height:       76.0 in Accession #:    [id number]    Weight:       225.0 lb Date of Birth:  10/28/1927     BSA:          2.329 m Patient Age:    86 years      BP:           142/74 mmHg Patient Gender: M             HR:           80 bpm. Exam Location:  Inpatient Procedure: 2D Echo Indications:    CHF- Acute Systolic I50.21  History:        Patient has no prior history  of Echocardiogram examinations.  Sonographer:    Thurman Coyer RDCS (AE) Referring Phys: [id number] Deno Lunger [facility] IMPRESSIONS  1. Poor acoustic windows limit study.  2. Left ventricular ejection fraction, by estimation, is 55 to 60%. The left ventricle has normal function. Left ventricular endocardial border not optimally defined to evaluate regional wall motion. Left ventricular diastolic parameters are consistent with Grade I diastolic dysfunction (impaired relaxation).  3. Right ventricular systolic function is mildly reduced. The right ventricular size is mildly enlarged. There is mildly elevated pulmonary artery systolic pressure.  4. Right atrial size was mildly dilated.  5. The mitral valve is normal in structure. Mild mitral valve regurgitation.  6. The aortic valve is abnormal. Aortic valve regurgitation is not visualized. Mild aortic valve sclerosis is present, with no evidence of aortic valve stenosis. FINDINGS  Left Ventricle: Left ventricular ejection fraction, by estimation, is 55 to 60%. The left ventricle has normal function.  Left ventricular endocardial border not optimally defined to evaluate regional wall motion. The left ventricular internal cavity size was normal in size. There is no left ventricular hypertrophy. Left ventricular diastolic parameters are consistent with Grade I diastolic dysfunction (impaired relaxation). Right Ventricle: The right ventricular size is mildly enlarged. Right vetricular wall thickness was not assessed. Right ventricular systolic function is mildly reduced. There is mildly elevated pulmonary artery systolic pressure. The tricuspid regurgitant velocity is 2.53 m/s, and with an assumed right atrial pressure of 15 mmHg, the estimated right ventricular systolic pressure is 40.6 mmHg. Left Atrium: Left atrial size was normal in size. Right Atrium: Right atrial size was mildly dilated. Pericardium: Trivial pericardial effusion is present. Mitral Valve: The mitral  valve is normal in structure. Mild mitral valve regurgitation. Tricuspid Valve: The tricuspid valve is normal in structure. Tricuspid valve regurgitation is mild. Aortic Valve: The aortic valve is abnormal. Aortic valve regurgitation is not visualized. Mild aortic valve sclerosis is present, with no evidence of aortic valve stenosis. Pulmonic Valve: The pulmonic valve was normal in structure. Pulmonic valve regurgitation is not visualized. Aorta: The aortic root is normal in size and structure. IAS/Shunts: The interatrial septum was not assessed.  LEFT VENTRICLE PLAX 2D LVIDd:         4.80 cm  Diastology LVIDs:         4.20 cm  LV e' medial:    6.85 cm/s LV PW:         0.90 cm  LV E/e' medial:  13.3 LV IVS:        0.90 cm  LV e' lateral:   9.14 cm/s LVOT diam:     2.20 cm  LV E/e' lateral: 9.9 LV SV:         78 LV SV Index:   33 LVOT Area:     3.80 cm  RIGHT VENTRICLE RV S prime:     11.00 cm/s TAPSE (M-mode): 2.0 cm LEFT ATRIUM           Index       RIGHT ATRIUM           Index LA diam:      2.80 cm 1.20 cm/m  RA Area:     25.30 cm LA Vol (A2C): 77.3 ml 33.19 ml/m RA Volume:   78.60 ml  33.75 ml/m LA Vol (A4C): 25.7 ml 11.04 ml/m  AORTIC VALVE LVOT Vmax:   85.80 cm/s LVOT Vmean:  55.900 cm/s LVOT VTI:    0.204 m  AORTA Ao Root diam: 4.00 cm MITRAL VALVE                TRICUSPID VALVE MV Area (PHT): 4.80 cm     TR Peak grad:   25.6 mmHg MV Decel Time: 158 msec     TR Vmax:        253.00 cm/s MV E velocity: 90.80 cm/s MV A velocity: 113.00 cm/s  SHUNTS MV E/A ratio:  0.80         Systemic VTI:  0.20 m                             Systemic Diam: 2.20 cm Dietrich Pates MD Electronically signed by Dietrich Pates MD Signature Date/Time: 08/23/2020/9:12:13 PM    Final      Labs:   Basic Metabolic Panel: Recent Labs  Lab 08/25/20 0216 08/26/20 7169 08/27/20 0257 08/28/20 0326 08/29/20 0337  NA  141 138 137 138 137  K 3.9 3.8 3.9 4.3 4.0  CL 92* 90* 88* 93* 92*  CO2 41* 38* 38* 31 36*  GLUCOSE 133* 83 114* 80  109*  BUN 30* 22 30* 35* 33*  CREATININE 0.91 0.87 0.98 1.00 0.94  CALCIUM 8.4* 8.5* 8.6* 8.6* 8.5*   GFR Estimated Creatinine Clearance: 64.1 mL/min (by C-G formula based on SCr of 0.94 mg/dL). Liver Function Tests: No results for input(s): AST, ALT, ALKPHOS, BILITOT, PROT, ALBUMIN in the last 168 hours. No results for input(s): LIPASE, AMYLASE in the last 168 hours. No results for input(s): AMMONIA in the last 168 hours. Coagulation profile No results for input(s): INR, PROTIME in the last 168 hours.  CBC: No results for input(s): WBC, NEUTROABS, HGB, HCT, MCV, PLT in the last 168 hours. Cardiac Enzymes: No results for input(s): CKTOTAL, CKMB, CKMBINDEX, TROPONINI in the last 168 hours. BNP: Invalid input(s): POCBNP CBG: No results for input(s): GLUCAP in the last 168 hours. D-Dimer No results for input(s): DDIMER in the last 72 hours. Hgb A1c No results for input(s): HGBA1C in the last 72 hours. Lipid Profile No results for input(s): CHOL, HDL, LDLCALC, TRIG, CHOLHDL, LDLDIRECT in the last 72 hours. Thyroid function studies No results for input(s): TSH, T4TOTAL, T3FREE, THYROIDAB in the last 72 hours.  Invalid input(s): FREET3 Anemia work up No results for input(s): VITAMINB12, FOLATE, FERRITIN, TIBC, IRON, RETICCTPCT in the last 72 hours. Microbiology Recent Results (from the past 240 hour(s))  Resp Panel by RT-PCR (Flu A&B, Covid) Nasopharyngeal Swab     Status: None   Collection Time: 08/22/20  4:27 PM   Specimen: Nasopharyngeal Swab; Nasopharyngeal(NP) swabs in vial transport medium  Result Value Ref Range Status   SARS Coronavirus 2 by RT PCR NEGATIVE NEGATIVE Final    Comment: (NOTE) SARS-CoV-2 target nucleic acids are NOT DETECTED.  The SARS-CoV-2 RNA is generally detectable in upper respiratory specimens during the acute phase of infection. The lowest concentration of SARS-CoV-2 viral copies this assay can detect is 138 copies/mL. A negative result does not  preclude SARS-Cov-2 infection and should not be used as the sole basis for treatment or other patient management decisions. A negative result may occur with  improper specimen collection/handling, submission of specimen other than nasopharyngeal swab, presence of viral mutation(s) within the areas targeted by this assay, and inadequate number of viral copies(<138 copies/mL). A negative result must be combined with clinical observations, patient history, and epidemiological information. The expected result is Negative.  Fact Sheet for Patients:  BloggerCourse.com  Fact Sheet for Healthcare Providers:  SeriousBroker.it  This test is no t yet approved or cleared by the [place] FDA and  has been authorized for detection and/or diagnosis of SARS-CoV-2 by FDA under an Emergency Use Authorization (EUA). This EUA will remain  in effect (meaning this test can be used) for the duration of the COVID-19 declaration under Section 564(b)(1) of the Act, 21 U.S.C.section 360bbb-3(b)(1), unless the authorization is terminated  or revoked sooner.       Influenza A by PCR NEGATIVE NEGATIVE Final   Influenza B by PCR NEGATIVE NEGATIVE Final    Comment: (NOTE) The Xpert Xpress SARS-CoV-2/FLU/RSV plus assay is intended as an aid in the diagnosis of influenza from Nasopharyngeal swab specimens and should not be used as a sole basis for treatment. Nasal washings and aspirates are unacceptable for Xpert Xpress SARS-CoV-2/FLU/RSV testing.  Fact Sheet for Patients: BloggerCourse.com  Fact Sheet for Healthcare Providers: SeriousBroker.it  This test is not yet approved or cleared by the [place] FDA and has been authorized for detection and/or diagnosis of SARS-CoV-2 by FDA under an Emergency Use Authorization (EUA). This EUA will remain in effect (meaning this test can be used) for the  duration of the COVID-19 declaration under Section 564(b)(1) of the Act, 21 U.S.C. section 360bbb-3(b)(1), unless the authorization is terminated or revoked.  Performed at [facility], [address] [place], [place] [address]   SARS Coronavirus 2 by RT PCR (hospital order, performed in [facility] hospital lab) Nasopharyngeal Nasopharyngeal Swab     Status: None   Collection Time: 08/28/20  3:32 PM   Specimen: Nasopharyngeal Swab  Result Value Ref Range Status   SARS Coronavirus 2 NEGATIVE NEGATIVE Final    Comment: (NOTE) SARS-CoV-2 target nucleic acids are NOT DETECTED.  The SARS-CoV-2 RNA is generally detectable in upper and lower respiratory specimens during the acute phase of infection. The lowest concentration of SARS-CoV-2 viral copies this assay can detect is 250 copies / mL. A negative result does not preclude SARS-CoV-2 infection and should not be used as the sole basis for treatment or other patient management decisions.  A negative result may occur with improper specimen collection / handling, submission of specimen other than nasopharyngeal swab, presence of viral mutation(s) within the areas targeted by this assay, and inadequate number of viral copies (<250 copies / mL). A negative result must be combined with clinical observations, patient history, and epidemiological information.  Fact Sheet for Patients:   BoilerBrush.com.cyhttps://www.fda.gov/media/136312/download  Fact Sheet for Healthcare Providers: https://pope.com/https://www.fda.gov/media/136313/download  This test is not yet approved or  cleared by the [place] FDA and has been authorized for detection and/or diagnosis of SARS-CoV-2 by FDA under an Emergency Use Authorization (EUA).  This EUA will remain in effect (meaning this test can be used) for the duration of the COVID-19 declaration under Section 564(b)(1) of the Act, 21 U.S.C. section 360bbb-3(b)(1), unless the authorization is terminated or revoked  sooner.  Performed at [facility], [address] [place], [place] [address]      Discharge Instructions:   Discharge Instructions    Diet - low sodium heart healthy   Complete by: As directed    Discharge wound care:   Complete by: As directed    Nystatin to groin   Increase activity slowly   Complete by: As directed      Allergies as of 08/31/2020   No Known Allergies     Medication List    STOP taking these medications   OVER THE COUNTER MEDICATION     TAKE these medications   albuterol (2.5 MG/3ML) 0.083% nebulizer solution Commonly known as: PROVENTIL Take 3 mLs (2.5 mg total) by nebulization every 2 (two) hours as needed for shortness of breath.   B COMPLEX PO Take 1 tablet by mouth daily.   CO Q-10 PO Take 1 capsule by mouth daily.   docusate sodium 100 MG capsule Commonly known as: COLACE Take 1 capsule (100 mg total) by mouth daily.   feeding supplement Liqd Take 237 mLs by mouth 2 (two) times daily between meals.   furosemide 40 MG tablet Commonly known as: LASIX Take 1 tablet (40 mg total) by mouth daily.   mometasone-formoterol 200-5 MCG/ACT Aero Commonly known as: DULERA Inhale 2 puffs into the lungs 2 (two) times daily.   nystatin powder Commonly known as: MYCOSTATIN/NYSTOP Apply topically 2 (two) times daily.  Discharge Care Instructions  (From admission, onward)         Start     Ordered   08/31/20 0000  Discharge wound care:       Comments: Nystatin to groin   08/31/20 4098          Contact information for follow-up providers    Renford Dills, MD Follow up in 1 week(s).   Specialty: Internal Medicine Contact information: [address] [place] [address] [phone]            Contact information for after-discharge care    Destination    [facility] .   Service: Skilled Nursing Contact information: [address] [place]  [address] [phone]                   Time coordinating discharge: 35 min  Signed:  Joseph Art DO  [facility] 08/31/2020, 8:32 AM

## 2020-08-31 NOTE — Social Work (Signed)
Insurance Berkley Harvey is approved. Auth number is [id number], 12/16- 12/20. Care coordinator Colbert Coyer, [facility] Clinical Social Worker [phone]

## 2020-08-31 NOTE — Progress Notes (Signed)
Nsg Discharge Note  Attempted x2 to call report to [name].    Admit Date:  08/22/2020 Discharge date: 08/31/2020   Hector Beltran to be D/C'd [name] per MD order.  AVS completed.  Copy for chart, and copy for patient signed, and dated. Patient/caregiver able to verbalize understanding.  Discharge Medication: Allergies as of 08/31/2020   No Known Allergies     Medication List    STOP taking these medications   OVER THE COUNTER MEDICATION     TAKE these medications   albuterol (2.5 MG/3ML) 0.083% nebulizer solution Commonly known as: PROVENTIL Take 3 mLs (2.5 mg total) by nebulization every 2 (two) hours as needed for shortness of breath.   B COMPLEX PO Take 1 tablet by mouth daily.   CO Q-10 PO Take 1 capsule by mouth daily.   docusate sodium 100 MG capsule Commonly known as: COLACE Take 1 capsule (100 mg total) by mouth daily.   feeding supplement Liqd Take 237 mLs by mouth 2 (two) times daily between meals.   furosemide 40 MG tablet Commonly known as: LASIX Take 1 tablet (40 mg total) by mouth daily.   mometasone-formoterol 200-5 MCG/ACT Aero Commonly known as: DULERA Inhale 2 puffs into the lungs 2 (two) times daily.   nystatin powder Commonly known as: MYCOSTATIN/NYSTOP Apply topically 2 (two) times daily.            Discharge Care Instructions  (From admission, onward)         Start     Ordered   08/31/20 0000  Discharge wound care:       Comments: Nystatin to groin   08/31/20 0832          Discharge Assessment: Vitals:   08/31/20 0755 08/31/20 1136  BP:  127/65  Pulse:  73  Resp:  18  Temp:  98.2 F (36.8 C)  SpO2: 97% 98%   Skin clean, dry and intact without evidence of skin break down, no evidence of skin tears noted. IV catheter discontinued intact. Site without signs and symptoms of complications - no redness or edema noted at insertion site, patient denies c/o pain - only slight tenderness at site.  Dressing  with slight pressure applied.  D/c Instructions-Education: Discharge instructions given to patient/family with verbalized understanding. D/c education completed with patient/family including follow up instructions, medication list, d/c activities limitations if indicated, with other d/c instructions as indicated by MD - patient able to verbalize understanding, all questions fully answered. Patient instructed to return to ED, call 911, or call MD for any changes in condition.  Patient escorted via WC, and D/C home via private auto.  Boykin Nearing, RN 08/31/2020 4:08 PM

## 2020-08-31 NOTE — TOC Transition Note (Signed)
Transition of Care [facility]) - CM/SW Discharge Note   Patient Details  Name: Hector Beltran MRN: [id number] Date of Birth: 05/13/1928  [facility] CM/SW Contact:  Jimmy Picket, [place] Phone Number: 08/31/2020, 1:51 PM   Clinical Narrative:     [facility] will [name]. Pts son [name] has been notified. Pts covid test is negative. [facility] has been requested.  Nurse to call report to [phone].  Final next level of care: Skilled Nursing Facility Barriers to Discharge: Continued Medical Work up   Patient Goals and CMS Choice Patient states their goals for this hospitalization and ongoing recovery are:: "I really want to be able to go back to my room."      Discharge Placement              [name] Patient to be transferred to facility by: [facility] Name of family member notified: [name], Son Patient and family notified of of transfer: 08/31/20  Discharge Plan and Services In-house Referral: Clinical Social Work                                   Social Determinants of Health (SDOH) Interventions     Readmission Risk Interventions No flowsheet data found.  Jimmy Picket, Theresia Majors, [place] Clinical Social Worker [phone]

## 2020-09-01 DIAGNOSIS — R627 Adult failure to thrive: Secondary | ICD-10-CM | POA: Diagnosis not present

## 2020-09-01 DIAGNOSIS — J449 Chronic obstructive pulmonary disease, unspecified: Secondary | ICD-10-CM | POA: Diagnosis not present

## 2020-09-01 DIAGNOSIS — I509 Heart failure, unspecified: Secondary | ICD-10-CM | POA: Diagnosis not present

## 2020-09-06 DIAGNOSIS — R627 Adult failure to thrive: Secondary | ICD-10-CM | POA: Diagnosis not present

## 2020-09-06 DIAGNOSIS — J449 Chronic obstructive pulmonary disease, unspecified: Secondary | ICD-10-CM | POA: Diagnosis not present

## 2020-09-06 DIAGNOSIS — I509 Heart failure, unspecified: Secondary | ICD-10-CM | POA: Diagnosis not present

## 2020-09-13 DIAGNOSIS — J029 Acute pharyngitis, unspecified: Secondary | ICD-10-CM | POA: Diagnosis not present

## 2020-09-13 DIAGNOSIS — R627 Adult failure to thrive: Secondary | ICD-10-CM | POA: Diagnosis not present

## 2020-09-13 DIAGNOSIS — J449 Chronic obstructive pulmonary disease, unspecified: Secondary | ICD-10-CM | POA: Diagnosis not present

## 2020-09-13 DIAGNOSIS — I509 Heart failure, unspecified: Secondary | ICD-10-CM | POA: Diagnosis not present

## 2020-09-22 DIAGNOSIS — I509 Heart failure, unspecified: Secondary | ICD-10-CM | POA: Diagnosis not present

## 2020-09-22 DIAGNOSIS — R627 Adult failure to thrive: Secondary | ICD-10-CM | POA: Diagnosis not present

## 2020-09-22 DIAGNOSIS — J449 Chronic obstructive pulmonary disease, unspecified: Secondary | ICD-10-CM | POA: Diagnosis not present

## 2020-09-29 DIAGNOSIS — F17210 Nicotine dependence, cigarettes, uncomplicated: Secondary | ICD-10-CM | POA: Diagnosis not present

## 2020-09-29 DIAGNOSIS — Z7951 Long term (current) use of inhaled steroids: Secondary | ICD-10-CM | POA: Diagnosis not present

## 2020-09-29 DIAGNOSIS — J9601 Acute respiratory failure with hypoxia: Secondary | ICD-10-CM | POA: Diagnosis not present

## 2020-09-29 DIAGNOSIS — K7460 Unspecified cirrhosis of liver: Secondary | ICD-10-CM | POA: Diagnosis not present

## 2020-09-29 DIAGNOSIS — J441 Chronic obstructive pulmonary disease with (acute) exacerbation: Secondary | ICD-10-CM | POA: Diagnosis not present

## 2020-09-29 DIAGNOSIS — Z87891 Personal history of nicotine dependence: Secondary | ICD-10-CM | POA: Diagnosis not present

## 2020-09-29 DIAGNOSIS — G629 Polyneuropathy, unspecified: Secondary | ICD-10-CM | POA: Diagnosis not present

## 2020-09-29 DIAGNOSIS — I5043 Acute on chronic combined systolic (congestive) and diastolic (congestive) heart failure: Secondary | ICD-10-CM | POA: Diagnosis not present

## 2020-09-29 DIAGNOSIS — K7689 Other specified diseases of liver: Secondary | ICD-10-CM | POA: Diagnosis not present

## 2020-10-05 DIAGNOSIS — Z87891 Personal history of nicotine dependence: Secondary | ICD-10-CM | POA: Diagnosis not present

## 2020-10-05 DIAGNOSIS — K7689 Other specified diseases of liver: Secondary | ICD-10-CM | POA: Diagnosis not present

## 2020-10-05 DIAGNOSIS — Z7951 Long term (current) use of inhaled steroids: Secondary | ICD-10-CM | POA: Diagnosis not present

## 2020-10-05 DIAGNOSIS — I5043 Acute on chronic combined systolic (congestive) and diastolic (congestive) heart failure: Secondary | ICD-10-CM | POA: Diagnosis not present

## 2020-10-05 DIAGNOSIS — J441 Chronic obstructive pulmonary disease with (acute) exacerbation: Secondary | ICD-10-CM | POA: Diagnosis not present

## 2020-10-05 DIAGNOSIS — K7460 Unspecified cirrhosis of liver: Secondary | ICD-10-CM | POA: Diagnosis not present

## 2020-10-05 DIAGNOSIS — G629 Polyneuropathy, unspecified: Secondary | ICD-10-CM | POA: Diagnosis not present

## 2020-10-05 DIAGNOSIS — J9601 Acute respiratory failure with hypoxia: Secondary | ICD-10-CM | POA: Diagnosis not present

## 2020-10-05 DIAGNOSIS — F17210 Nicotine dependence, cigarettes, uncomplicated: Secondary | ICD-10-CM | POA: Diagnosis not present

## 2020-10-10 DIAGNOSIS — J441 Chronic obstructive pulmonary disease with (acute) exacerbation: Secondary | ICD-10-CM | POA: Diagnosis not present

## 2020-10-10 DIAGNOSIS — K7460 Unspecified cirrhosis of liver: Secondary | ICD-10-CM | POA: Diagnosis not present

## 2020-10-10 DIAGNOSIS — Z87891 Personal history of nicotine dependence: Secondary | ICD-10-CM | POA: Diagnosis not present

## 2020-10-10 DIAGNOSIS — J9601 Acute respiratory failure with hypoxia: Secondary | ICD-10-CM | POA: Diagnosis not present

## 2020-10-10 DIAGNOSIS — K7689 Other specified diseases of liver: Secondary | ICD-10-CM | POA: Diagnosis not present

## 2020-10-10 DIAGNOSIS — Z7951 Long term (current) use of inhaled steroids: Secondary | ICD-10-CM | POA: Diagnosis not present

## 2020-10-10 DIAGNOSIS — F17210 Nicotine dependence, cigarettes, uncomplicated: Secondary | ICD-10-CM | POA: Diagnosis not present

## 2020-10-10 DIAGNOSIS — I5043 Acute on chronic combined systolic (congestive) and diastolic (congestive) heart failure: Secondary | ICD-10-CM | POA: Diagnosis not present

## 2020-10-10 DIAGNOSIS — G629 Polyneuropathy, unspecified: Secondary | ICD-10-CM | POA: Diagnosis not present

## 2020-10-23 DIAGNOSIS — J441 Chronic obstructive pulmonary disease with (acute) exacerbation: Secondary | ICD-10-CM | POA: Diagnosis not present

## 2020-10-23 DIAGNOSIS — K7689 Other specified diseases of liver: Secondary | ICD-10-CM | POA: Diagnosis not present

## 2020-10-23 DIAGNOSIS — K7460 Unspecified cirrhosis of liver: Secondary | ICD-10-CM | POA: Diagnosis not present

## 2020-10-23 DIAGNOSIS — Z87891 Personal history of nicotine dependence: Secondary | ICD-10-CM | POA: Diagnosis not present

## 2020-10-23 DIAGNOSIS — Z7951 Long term (current) use of inhaled steroids: Secondary | ICD-10-CM | POA: Diagnosis not present

## 2020-10-23 DIAGNOSIS — J9601 Acute respiratory failure with hypoxia: Secondary | ICD-10-CM | POA: Diagnosis not present

## 2020-10-23 DIAGNOSIS — F17210 Nicotine dependence, cigarettes, uncomplicated: Secondary | ICD-10-CM | POA: Diagnosis not present

## 2020-10-23 DIAGNOSIS — G629 Polyneuropathy, unspecified: Secondary | ICD-10-CM | POA: Diagnosis not present

## 2020-10-23 DIAGNOSIS — I5043 Acute on chronic combined systolic (congestive) and diastolic (congestive) heart failure: Secondary | ICD-10-CM | POA: Diagnosis not present

## 2020-10-29 DIAGNOSIS — K7460 Unspecified cirrhosis of liver: Secondary | ICD-10-CM | POA: Diagnosis not present

## 2020-10-29 DIAGNOSIS — Z87891 Personal history of nicotine dependence: Secondary | ICD-10-CM | POA: Diagnosis not present

## 2020-10-29 DIAGNOSIS — G629 Polyneuropathy, unspecified: Secondary | ICD-10-CM | POA: Diagnosis not present

## 2020-10-29 DIAGNOSIS — J441 Chronic obstructive pulmonary disease with (acute) exacerbation: Secondary | ICD-10-CM | POA: Diagnosis not present

## 2020-10-29 DIAGNOSIS — F17210 Nicotine dependence, cigarettes, uncomplicated: Secondary | ICD-10-CM | POA: Diagnosis not present

## 2020-10-29 DIAGNOSIS — J9601 Acute respiratory failure with hypoxia: Secondary | ICD-10-CM | POA: Diagnosis not present

## 2020-10-29 DIAGNOSIS — K7689 Other specified diseases of liver: Secondary | ICD-10-CM | POA: Diagnosis not present

## 2020-10-29 DIAGNOSIS — Z7951 Long term (current) use of inhaled steroids: Secondary | ICD-10-CM | POA: Diagnosis not present

## 2020-10-29 DIAGNOSIS — I5043 Acute on chronic combined systolic (congestive) and diastolic (congestive) heart failure: Secondary | ICD-10-CM | POA: Diagnosis not present

## 2020-11-06 DIAGNOSIS — M47812 Spondylosis without myelopathy or radiculopathy, cervical region: Secondary | ICD-10-CM | POA: Diagnosis not present

## 2020-11-06 DIAGNOSIS — M9901 Segmental and somatic dysfunction of cervical region: Secondary | ICD-10-CM | POA: Diagnosis not present

## 2020-11-07 DIAGNOSIS — M9901 Segmental and somatic dysfunction of cervical region: Secondary | ICD-10-CM | POA: Diagnosis not present

## 2020-11-07 DIAGNOSIS — M47812 Spondylosis without myelopathy or radiculopathy, cervical region: Secondary | ICD-10-CM | POA: Diagnosis not present

## 2020-11-08 DIAGNOSIS — M47812 Spondylosis without myelopathy or radiculopathy, cervical region: Secondary | ICD-10-CM | POA: Diagnosis not present

## 2020-11-08 DIAGNOSIS — M9901 Segmental and somatic dysfunction of cervical region: Secondary | ICD-10-CM | POA: Diagnosis not present

## 2020-11-09 DIAGNOSIS — F17210 Nicotine dependence, cigarettes, uncomplicated: Secondary | ICD-10-CM | POA: Diagnosis not present

## 2020-11-09 DIAGNOSIS — Z7951 Long term (current) use of inhaled steroids: Secondary | ICD-10-CM | POA: Diagnosis not present

## 2020-11-09 DIAGNOSIS — M47812 Spondylosis without myelopathy or radiculopathy, cervical region: Secondary | ICD-10-CM | POA: Diagnosis not present

## 2020-11-09 DIAGNOSIS — Z87891 Personal history of nicotine dependence: Secondary | ICD-10-CM | POA: Diagnosis not present

## 2020-11-09 DIAGNOSIS — M9901 Segmental and somatic dysfunction of cervical region: Secondary | ICD-10-CM | POA: Diagnosis not present

## 2020-11-09 DIAGNOSIS — K7689 Other specified diseases of liver: Secondary | ICD-10-CM | POA: Diagnosis not present

## 2020-11-09 DIAGNOSIS — J441 Chronic obstructive pulmonary disease with (acute) exacerbation: Secondary | ICD-10-CM | POA: Diagnosis not present

## 2020-11-09 DIAGNOSIS — J9601 Acute respiratory failure with hypoxia: Secondary | ICD-10-CM | POA: Diagnosis not present

## 2020-11-09 DIAGNOSIS — G629 Polyneuropathy, unspecified: Secondary | ICD-10-CM | POA: Diagnosis not present

## 2020-11-09 DIAGNOSIS — K7460 Unspecified cirrhosis of liver: Secondary | ICD-10-CM | POA: Diagnosis not present

## 2020-11-09 DIAGNOSIS — I5043 Acute on chronic combined systolic (congestive) and diastolic (congestive) heart failure: Secondary | ICD-10-CM | POA: Diagnosis not present

## 2020-11-12 DIAGNOSIS — Z20828 Contact with and (suspected) exposure to other viral communicable diseases: Secondary | ICD-10-CM | POA: Diagnosis not present

## 2020-11-12 DIAGNOSIS — M9901 Segmental and somatic dysfunction of cervical region: Secondary | ICD-10-CM | POA: Diagnosis not present

## 2020-11-12 DIAGNOSIS — M47812 Spondylosis without myelopathy or radiculopathy, cervical region: Secondary | ICD-10-CM | POA: Diagnosis not present

## 2020-11-12 DIAGNOSIS — Z1159 Encounter for screening for other viral diseases: Secondary | ICD-10-CM | POA: Diagnosis not present

## 2020-11-14 DIAGNOSIS — M9901 Segmental and somatic dysfunction of cervical region: Secondary | ICD-10-CM | POA: Diagnosis not present

## 2020-11-14 DIAGNOSIS — M47812 Spondylosis without myelopathy or radiculopathy, cervical region: Secondary | ICD-10-CM | POA: Diagnosis not present

## 2020-11-15 DIAGNOSIS — M9901 Segmental and somatic dysfunction of cervical region: Secondary | ICD-10-CM | POA: Diagnosis not present

## 2020-11-15 DIAGNOSIS — M47812 Spondylosis without myelopathy or radiculopathy, cervical region: Secondary | ICD-10-CM | POA: Diagnosis not present

## 2020-11-16 DIAGNOSIS — M9901 Segmental and somatic dysfunction of cervical region: Secondary | ICD-10-CM | POA: Diagnosis not present

## 2020-11-16 DIAGNOSIS — M47812 Spondylosis without myelopathy or radiculopathy, cervical region: Secondary | ICD-10-CM | POA: Diagnosis not present

## 2020-11-19 DIAGNOSIS — Z20828 Contact with and (suspected) exposure to other viral communicable diseases: Secondary | ICD-10-CM | POA: Diagnosis not present

## 2020-11-19 DIAGNOSIS — M9901 Segmental and somatic dysfunction of cervical region: Secondary | ICD-10-CM | POA: Diagnosis not present

## 2020-11-19 DIAGNOSIS — M47812 Spondylosis without myelopathy or radiculopathy, cervical region: Secondary | ICD-10-CM | POA: Diagnosis not present

## 2020-11-19 DIAGNOSIS — Z1159 Encounter for screening for other viral diseases: Secondary | ICD-10-CM | POA: Diagnosis not present

## 2020-11-21 DIAGNOSIS — M9901 Segmental and somatic dysfunction of cervical region: Secondary | ICD-10-CM | POA: Diagnosis not present

## 2020-11-21 DIAGNOSIS — M47812 Spondylosis without myelopathy or radiculopathy, cervical region: Secondary | ICD-10-CM | POA: Diagnosis not present

## 2020-11-23 DIAGNOSIS — K7689 Other specified diseases of liver: Secondary | ICD-10-CM | POA: Diagnosis not present

## 2020-11-23 DIAGNOSIS — G629 Polyneuropathy, unspecified: Secondary | ICD-10-CM | POA: Diagnosis not present

## 2020-11-23 DIAGNOSIS — J9601 Acute respiratory failure with hypoxia: Secondary | ICD-10-CM | POA: Diagnosis not present

## 2020-11-23 DIAGNOSIS — K7460 Unspecified cirrhosis of liver: Secondary | ICD-10-CM | POA: Diagnosis not present

## 2020-11-23 DIAGNOSIS — F17210 Nicotine dependence, cigarettes, uncomplicated: Secondary | ICD-10-CM | POA: Diagnosis not present

## 2020-11-23 DIAGNOSIS — J441 Chronic obstructive pulmonary disease with (acute) exacerbation: Secondary | ICD-10-CM | POA: Diagnosis not present

## 2020-11-23 DIAGNOSIS — Z7951 Long term (current) use of inhaled steroids: Secondary | ICD-10-CM | POA: Diagnosis not present

## 2020-11-23 DIAGNOSIS — M47812 Spondylosis without myelopathy or radiculopathy, cervical region: Secondary | ICD-10-CM | POA: Diagnosis not present

## 2020-11-23 DIAGNOSIS — M9901 Segmental and somatic dysfunction of cervical region: Secondary | ICD-10-CM | POA: Diagnosis not present

## 2020-11-23 DIAGNOSIS — Z87891 Personal history of nicotine dependence: Secondary | ICD-10-CM | POA: Diagnosis not present

## 2020-11-23 DIAGNOSIS — I5043 Acute on chronic combined systolic (congestive) and diastolic (congestive) heart failure: Secondary | ICD-10-CM | POA: Diagnosis not present

## 2020-11-26 DIAGNOSIS — Z20828 Contact with and (suspected) exposure to other viral communicable diseases: Secondary | ICD-10-CM | POA: Diagnosis not present

## 2020-11-26 DIAGNOSIS — M47812 Spondylosis without myelopathy or radiculopathy, cervical region: Secondary | ICD-10-CM | POA: Diagnosis not present

## 2020-11-26 DIAGNOSIS — M9901 Segmental and somatic dysfunction of cervical region: Secondary | ICD-10-CM | POA: Diagnosis not present

## 2020-11-26 DIAGNOSIS — Z1159 Encounter for screening for other viral diseases: Secondary | ICD-10-CM | POA: Diagnosis not present

## 2020-11-28 DIAGNOSIS — Z7951 Long term (current) use of inhaled steroids: Secondary | ICD-10-CM | POA: Diagnosis not present

## 2020-11-28 DIAGNOSIS — K7689 Other specified diseases of liver: Secondary | ICD-10-CM | POA: Diagnosis not present

## 2020-11-28 DIAGNOSIS — M9901 Segmental and somatic dysfunction of cervical region: Secondary | ICD-10-CM | POA: Diagnosis not present

## 2020-11-28 DIAGNOSIS — M47812 Spondylosis without myelopathy or radiculopathy, cervical region: Secondary | ICD-10-CM | POA: Diagnosis not present

## 2020-11-28 DIAGNOSIS — J449 Chronic obstructive pulmonary disease, unspecified: Secondary | ICD-10-CM | POA: Diagnosis not present

## 2020-11-28 DIAGNOSIS — I5043 Acute on chronic combined systolic (congestive) and diastolic (congestive) heart failure: Secondary | ICD-10-CM | POA: Diagnosis not present

## 2020-11-28 DIAGNOSIS — Z87891 Personal history of nicotine dependence: Secondary | ICD-10-CM | POA: Diagnosis not present

## 2020-11-28 DIAGNOSIS — F17210 Nicotine dependence, cigarettes, uncomplicated: Secondary | ICD-10-CM | POA: Diagnosis not present

## 2020-11-28 DIAGNOSIS — K7460 Unspecified cirrhosis of liver: Secondary | ICD-10-CM | POA: Diagnosis not present

## 2020-11-28 DIAGNOSIS — G629 Polyneuropathy, unspecified: Secondary | ICD-10-CM | POA: Diagnosis not present

## 2020-11-30 ENCOUNTER — Other Ambulatory Visit: Payer: Self-pay | Admitting: Internal Medicine

## 2020-11-30 ENCOUNTER — Ambulatory Visit
Admission: RE | Admit: 2020-11-30 | Discharge: 2020-11-30 | Disposition: A | Discharge status: Home / Self Care | Payer: Medicare PPO | Source: Ambulatory Visit | Attending: Internal Medicine | Admitting: Internal Medicine

## 2020-11-30 DIAGNOSIS — I509 Heart failure, unspecified: Secondary | ICD-10-CM

## 2020-11-30 DIAGNOSIS — M542 Cervicalgia: Secondary | ICD-10-CM | POA: Diagnosis not present

## 2020-11-30 DIAGNOSIS — I5030 Unspecified diastolic (congestive) heart failure: Secondary | ICD-10-CM | POA: Diagnosis not present

## 2020-11-30 DIAGNOSIS — I700 Atherosclerosis of aorta: Secondary | ICD-10-CM | POA: Diagnosis not present

## 2020-11-30 DIAGNOSIS — R109 Unspecified abdominal pain: Secondary | ICD-10-CM | POA: Diagnosis not present

## 2020-11-30 DIAGNOSIS — J449 Chronic obstructive pulmonary disease, unspecified: Secondary | ICD-10-CM | POA: Diagnosis not present

## 2020-11-30 DIAGNOSIS — G609 Hereditary and idiopathic neuropathy, unspecified: Secondary | ICD-10-CM | POA: Diagnosis not present

## 2020-11-30 DIAGNOSIS — I517 Cardiomegaly: Secondary | ICD-10-CM | POA: Diagnosis not present

## 2020-11-30 DIAGNOSIS — K7460 Unspecified cirrhosis of liver: Secondary | ICD-10-CM | POA: Diagnosis not present

## 2020-11-30 DIAGNOSIS — M5136 Other intervertebral disc degeneration, lumbar region: Secondary | ICD-10-CM | POA: Diagnosis not present

## 2020-12-03 DIAGNOSIS — M9901 Segmental and somatic dysfunction of cervical region: Secondary | ICD-10-CM | POA: Diagnosis not present

## 2020-12-03 DIAGNOSIS — Z20828 Contact with and (suspected) exposure to other viral communicable diseases: Secondary | ICD-10-CM | POA: Diagnosis not present

## 2020-12-03 DIAGNOSIS — M47812 Spondylosis without myelopathy or radiculopathy, cervical region: Secondary | ICD-10-CM | POA: Diagnosis not present

## 2020-12-03 DIAGNOSIS — Z1159 Encounter for screening for other viral diseases: Secondary | ICD-10-CM | POA: Diagnosis not present

## 2020-12-10 DIAGNOSIS — Z20828 Contact with and (suspected) exposure to other viral communicable diseases: Secondary | ICD-10-CM | POA: Diagnosis not present

## 2020-12-10 DIAGNOSIS — Z1159 Encounter for screening for other viral diseases: Secondary | ICD-10-CM | POA: Diagnosis not present

## 2020-12-11 DIAGNOSIS — M4692 Unspecified inflammatory spondylopathy, cervical region: Secondary | ICD-10-CM | POA: Diagnosis not present

## 2020-12-11 DIAGNOSIS — I700 Atherosclerosis of aorta: Secondary | ICD-10-CM | POA: Diagnosis not present

## 2020-12-11 DIAGNOSIS — K5900 Constipation, unspecified: Secondary | ICD-10-CM | POA: Diagnosis not present

## 2020-12-11 DIAGNOSIS — I5030 Unspecified diastolic (congestive) heart failure: Secondary | ICD-10-CM | POA: Diagnosis not present

## 2020-12-14 DIAGNOSIS — I5043 Acute on chronic combined systolic (congestive) and diastolic (congestive) heart failure: Secondary | ICD-10-CM | POA: Diagnosis not present

## 2020-12-14 DIAGNOSIS — Z7951 Long term (current) use of inhaled steroids: Secondary | ICD-10-CM | POA: Diagnosis not present

## 2020-12-14 DIAGNOSIS — G629 Polyneuropathy, unspecified: Secondary | ICD-10-CM | POA: Diagnosis not present

## 2020-12-14 DIAGNOSIS — Z87891 Personal history of nicotine dependence: Secondary | ICD-10-CM | POA: Diagnosis not present

## 2020-12-14 DIAGNOSIS — K7689 Other specified diseases of liver: Secondary | ICD-10-CM | POA: Diagnosis not present

## 2020-12-14 DIAGNOSIS — F17210 Nicotine dependence, cigarettes, uncomplicated: Secondary | ICD-10-CM | POA: Diagnosis not present

## 2020-12-14 DIAGNOSIS — J449 Chronic obstructive pulmonary disease, unspecified: Secondary | ICD-10-CM | POA: Diagnosis not present

## 2020-12-14 DIAGNOSIS — K7460 Unspecified cirrhosis of liver: Secondary | ICD-10-CM | POA: Diagnosis not present

## 2020-12-17 DIAGNOSIS — Z1159 Encounter for screening for other viral diseases: Secondary | ICD-10-CM | POA: Diagnosis not present

## 2020-12-17 DIAGNOSIS — Z20828 Contact with and (suspected) exposure to other viral communicable diseases: Secondary | ICD-10-CM | POA: Diagnosis not present

## 2020-12-21 DIAGNOSIS — Z7951 Long term (current) use of inhaled steroids: Secondary | ICD-10-CM | POA: Diagnosis not present

## 2020-12-21 DIAGNOSIS — Z87891 Personal history of nicotine dependence: Secondary | ICD-10-CM | POA: Diagnosis not present

## 2020-12-21 DIAGNOSIS — K7460 Unspecified cirrhosis of liver: Secondary | ICD-10-CM | POA: Diagnosis not present

## 2020-12-21 DIAGNOSIS — G629 Polyneuropathy, unspecified: Secondary | ICD-10-CM | POA: Diagnosis not present

## 2020-12-21 DIAGNOSIS — I5043 Acute on chronic combined systolic (congestive) and diastolic (congestive) heart failure: Secondary | ICD-10-CM | POA: Diagnosis not present

## 2020-12-21 DIAGNOSIS — K7689 Other specified diseases of liver: Secondary | ICD-10-CM | POA: Diagnosis not present

## 2020-12-21 DIAGNOSIS — F17210 Nicotine dependence, cigarettes, uncomplicated: Secondary | ICD-10-CM | POA: Diagnosis not present

## 2020-12-21 DIAGNOSIS — J449 Chronic obstructive pulmonary disease, unspecified: Secondary | ICD-10-CM | POA: Diagnosis not present

## 2020-12-24 DIAGNOSIS — Z20828 Contact with and (suspected) exposure to other viral communicable diseases: Secondary | ICD-10-CM | POA: Diagnosis not present

## 2020-12-24 DIAGNOSIS — Z1159 Encounter for screening for other viral diseases: Secondary | ICD-10-CM | POA: Diagnosis not present

## 2020-12-31 DIAGNOSIS — Z20828 Contact with and (suspected) exposure to other viral communicable diseases: Secondary | ICD-10-CM | POA: Diagnosis not present

## 2020-12-31 DIAGNOSIS — Z1159 Encounter for screening for other viral diseases: Secondary | ICD-10-CM | POA: Diagnosis not present

## 2021-01-07 ENCOUNTER — Other Ambulatory Visit: Payer: Self-pay | Admitting: Physician Assistant

## 2021-01-07 DIAGNOSIS — K7469 Other cirrhosis of liver: Secondary | ICD-10-CM

## 2021-01-07 DIAGNOSIS — R1013 Epigastric pain: Secondary | ICD-10-CM | POA: Diagnosis not present

## 2021-01-07 DIAGNOSIS — J449 Chronic obstructive pulmonary disease, unspecified: Secondary | ICD-10-CM | POA: Diagnosis not present

## 2021-01-07 DIAGNOSIS — R932 Abnormal findings on diagnostic imaging of liver and biliary tract: Secondary | ICD-10-CM | POA: Diagnosis not present

## 2021-01-07 DIAGNOSIS — Z20828 Contact with and (suspected) exposure to other viral communicable diseases: Secondary | ICD-10-CM | POA: Diagnosis not present

## 2021-01-07 DIAGNOSIS — I509 Heart failure, unspecified: Secondary | ICD-10-CM | POA: Diagnosis not present

## 2021-01-07 DIAGNOSIS — Z1211 Encounter for screening for malignant neoplasm of colon: Secondary | ICD-10-CM | POA: Diagnosis not present

## 2021-01-07 DIAGNOSIS — Z1159 Encounter for screening for other viral diseases: Secondary | ICD-10-CM | POA: Diagnosis not present

## 2021-01-14 DIAGNOSIS — Z1159 Encounter for screening for other viral diseases: Secondary | ICD-10-CM | POA: Diagnosis not present

## 2021-01-14 DIAGNOSIS — Z20828 Contact with and (suspected) exposure to other viral communicable diseases: Secondary | ICD-10-CM | POA: Diagnosis not present

## 2021-01-16 ENCOUNTER — Ambulatory Visit
Admission: RE | Admit: 2021-01-16 | Discharge: 2021-01-16 | Disposition: A | Discharge status: Home / Self Care | Payer: Medicare PPO | Source: Ambulatory Visit | Attending: Physician Assistant | Admitting: Physician Assistant

## 2021-01-16 ENCOUNTER — Other Ambulatory Visit: Payer: Self-pay

## 2021-01-16 DIAGNOSIS — N281 Cyst of kidney, acquired: Secondary | ICD-10-CM | POA: Diagnosis not present

## 2021-01-16 DIAGNOSIS — K7469 Other cirrhosis of liver: Secondary | ICD-10-CM

## 2021-01-16 DIAGNOSIS — K7689 Other specified diseases of liver: Secondary | ICD-10-CM | POA: Diagnosis not present

## 2021-01-16 DIAGNOSIS — I2510 Atherosclerotic heart disease of native coronary artery without angina pectoris: Secondary | ICD-10-CM | POA: Diagnosis not present

## 2021-01-16 DIAGNOSIS — R932 Abnormal findings on diagnostic imaging of liver and biliary tract: Secondary | ICD-10-CM

## 2021-01-16 MED ORDER — GADOBENATE DIMEGLUMINE 529 MG/ML IV SOLN
18.0000 mL | Freq: Once | INTRAVENOUS | Status: AC | PRN
  Administered 2021-01-16: 18 mL via INTRAVENOUS

## 2021-01-21 DIAGNOSIS — Z20828 Contact with and (suspected) exposure to other viral communicable diseases: Secondary | ICD-10-CM | POA: Diagnosis not present

## 2021-01-21 DIAGNOSIS — Z1159 Encounter for screening for other viral diseases: Secondary | ICD-10-CM | POA: Diagnosis not present

## 2021-01-23 DIAGNOSIS — Z85828 Personal history of other malignant neoplasm of skin: Secondary | ICD-10-CM | POA: Diagnosis not present

## 2021-01-23 DIAGNOSIS — L218 Other seborrheic dermatitis: Secondary | ICD-10-CM | POA: Diagnosis not present

## 2021-01-23 DIAGNOSIS — L720 Epidermal cyst: Secondary | ICD-10-CM | POA: Diagnosis not present

## 2021-01-28 DIAGNOSIS — Z20828 Contact with and (suspected) exposure to other viral communicable diseases: Secondary | ICD-10-CM | POA: Diagnosis not present

## 2021-01-28 DIAGNOSIS — Z1159 Encounter for screening for other viral diseases: Secondary | ICD-10-CM | POA: Diagnosis not present

## 2021-02-04 DIAGNOSIS — Z20828 Contact with and (suspected) exposure to other viral communicable diseases: Secondary | ICD-10-CM | POA: Diagnosis not present

## 2021-02-04 DIAGNOSIS — Z1159 Encounter for screening for other viral diseases: Secondary | ICD-10-CM | POA: Diagnosis not present

## 2021-02-11 DIAGNOSIS — Z20828 Contact with and (suspected) exposure to other viral communicable diseases: Secondary | ICD-10-CM | POA: Diagnosis not present

## 2021-02-11 DIAGNOSIS — Z1159 Encounter for screening for other viral diseases: Secondary | ICD-10-CM | POA: Diagnosis not present

## 2021-02-18 DIAGNOSIS — Z1159 Encounter for screening for other viral diseases: Secondary | ICD-10-CM | POA: Diagnosis not present

## 2021-02-18 DIAGNOSIS — Z20828 Contact with and (suspected) exposure to other viral communicable diseases: Secondary | ICD-10-CM | POA: Diagnosis not present

## 2021-02-20 DIAGNOSIS — L218 Other seborrheic dermatitis: Secondary | ICD-10-CM | POA: Diagnosis not present

## 2021-02-20 DIAGNOSIS — D1801 Hemangioma of skin and subcutaneous tissue: Secondary | ICD-10-CM | POA: Diagnosis not present

## 2021-02-20 DIAGNOSIS — M6281 Muscle weakness (generalized): Secondary | ICD-10-CM | POA: Diagnosis not present

## 2021-02-20 DIAGNOSIS — R2681 Unsteadiness on feet: Secondary | ICD-10-CM | POA: Diagnosis not present

## 2021-02-20 DIAGNOSIS — D692 Other nonthrombocytopenic purpura: Secondary | ICD-10-CM | POA: Diagnosis not present

## 2021-02-20 DIAGNOSIS — R262 Difficulty in walking, not elsewhere classified: Secondary | ICD-10-CM | POA: Diagnosis not present

## 2021-02-20 DIAGNOSIS — Z85828 Personal history of other malignant neoplasm of skin: Secondary | ICD-10-CM | POA: Diagnosis not present

## 2021-02-20 DIAGNOSIS — M25551 Pain in right hip: Secondary | ICD-10-CM | POA: Diagnosis not present

## 2021-02-20 DIAGNOSIS — L821 Other seborrheic keratosis: Secondary | ICD-10-CM | POA: Diagnosis not present

## 2021-02-22 DIAGNOSIS — M25551 Pain in right hip: Secondary | ICD-10-CM | POA: Diagnosis not present

## 2021-02-22 DIAGNOSIS — R2681 Unsteadiness on feet: Secondary | ICD-10-CM | POA: Diagnosis not present

## 2021-02-22 DIAGNOSIS — R262 Difficulty in walking, not elsewhere classified: Secondary | ICD-10-CM | POA: Diagnosis not present

## 2021-02-22 DIAGNOSIS — M6281 Muscle weakness (generalized): Secondary | ICD-10-CM | POA: Diagnosis not present

## 2021-02-25 DIAGNOSIS — Z20828 Contact with and (suspected) exposure to other viral communicable diseases: Secondary | ICD-10-CM | POA: Diagnosis not present

## 2021-02-25 DIAGNOSIS — R262 Difficulty in walking, not elsewhere classified: Secondary | ICD-10-CM | POA: Diagnosis not present

## 2021-02-25 DIAGNOSIS — M6281 Muscle weakness (generalized): Secondary | ICD-10-CM | POA: Diagnosis not present

## 2021-02-25 DIAGNOSIS — R2681 Unsteadiness on feet: Secondary | ICD-10-CM | POA: Diagnosis not present

## 2021-02-25 DIAGNOSIS — M25551 Pain in right hip: Secondary | ICD-10-CM | POA: Diagnosis not present

## 2021-02-25 DIAGNOSIS — Z1159 Encounter for screening for other viral diseases: Secondary | ICD-10-CM | POA: Diagnosis not present

## 2021-02-27 DIAGNOSIS — I5030 Unspecified diastolic (congestive) heart failure: Secondary | ICD-10-CM | POA: Diagnosis not present

## 2021-02-27 DIAGNOSIS — N400 Enlarged prostate without lower urinary tract symptoms: Secondary | ICD-10-CM | POA: Diagnosis not present

## 2021-02-27 DIAGNOSIS — G609 Hereditary and idiopathic neuropathy, unspecified: Secondary | ICD-10-CM | POA: Diagnosis not present

## 2021-02-27 DIAGNOSIS — K7689 Other specified diseases of liver: Secondary | ICD-10-CM | POA: Diagnosis not present

## 2021-02-27 DIAGNOSIS — I700 Atherosclerosis of aorta: Secondary | ICD-10-CM | POA: Diagnosis not present

## 2021-02-27 DIAGNOSIS — J449 Chronic obstructive pulmonary disease, unspecified: Secondary | ICD-10-CM | POA: Diagnosis not present

## 2021-02-27 DIAGNOSIS — N281 Cyst of kidney, acquired: Secondary | ICD-10-CM | POA: Diagnosis not present

## 2021-02-27 DIAGNOSIS — N1831 Chronic kidney disease, stage 3a: Secondary | ICD-10-CM | POA: Diagnosis not present

## 2021-03-01 DIAGNOSIS — R262 Difficulty in walking, not elsewhere classified: Secondary | ICD-10-CM | POA: Diagnosis not present

## 2021-03-01 DIAGNOSIS — M6281 Muscle weakness (generalized): Secondary | ICD-10-CM | POA: Diagnosis not present

## 2021-03-01 DIAGNOSIS — M25551 Pain in right hip: Secondary | ICD-10-CM | POA: Diagnosis not present

## 2021-03-01 DIAGNOSIS — R2681 Unsteadiness on feet: Secondary | ICD-10-CM | POA: Diagnosis not present

## 2021-03-04 DIAGNOSIS — Z1159 Encounter for screening for other viral diseases: Secondary | ICD-10-CM | POA: Diagnosis not present

## 2021-03-04 DIAGNOSIS — M25551 Pain in right hip: Secondary | ICD-10-CM | POA: Diagnosis not present

## 2021-03-04 DIAGNOSIS — M6281 Muscle weakness (generalized): Secondary | ICD-10-CM | POA: Diagnosis not present

## 2021-03-04 DIAGNOSIS — Z20828 Contact with and (suspected) exposure to other viral communicable diseases: Secondary | ICD-10-CM | POA: Diagnosis not present

## 2021-03-04 DIAGNOSIS — R262 Difficulty in walking, not elsewhere classified: Secondary | ICD-10-CM | POA: Diagnosis not present

## 2021-03-04 DIAGNOSIS — R2681 Unsteadiness on feet: Secondary | ICD-10-CM | POA: Diagnosis not present

## 2021-03-06 DIAGNOSIS — M25551 Pain in right hip: Secondary | ICD-10-CM | POA: Diagnosis not present

## 2021-03-06 DIAGNOSIS — R262 Difficulty in walking, not elsewhere classified: Secondary | ICD-10-CM | POA: Diagnosis not present

## 2021-03-06 DIAGNOSIS — M6281 Muscle weakness (generalized): Secondary | ICD-10-CM | POA: Diagnosis not present

## 2021-03-06 DIAGNOSIS — R2681 Unsteadiness on feet: Secondary | ICD-10-CM | POA: Diagnosis not present

## 2021-03-08 DIAGNOSIS — R2681 Unsteadiness on feet: Secondary | ICD-10-CM | POA: Diagnosis not present

## 2021-03-08 DIAGNOSIS — M6281 Muscle weakness (generalized): Secondary | ICD-10-CM | POA: Diagnosis not present

## 2021-03-08 DIAGNOSIS — M25551 Pain in right hip: Secondary | ICD-10-CM | POA: Diagnosis not present

## 2021-03-08 DIAGNOSIS — R262 Difficulty in walking, not elsewhere classified: Secondary | ICD-10-CM | POA: Diagnosis not present

## 2021-03-11 DIAGNOSIS — Z20828 Contact with and (suspected) exposure to other viral communicable diseases: Secondary | ICD-10-CM | POA: Diagnosis not present

## 2021-03-11 DIAGNOSIS — M25551 Pain in right hip: Secondary | ICD-10-CM | POA: Diagnosis not present

## 2021-03-11 DIAGNOSIS — M6281 Muscle weakness (generalized): Secondary | ICD-10-CM | POA: Diagnosis not present

## 2021-03-11 DIAGNOSIS — R262 Difficulty in walking, not elsewhere classified: Secondary | ICD-10-CM | POA: Diagnosis not present

## 2021-03-11 DIAGNOSIS — Z1159 Encounter for screening for other viral diseases: Secondary | ICD-10-CM | POA: Diagnosis not present

## 2021-03-11 DIAGNOSIS — R2681 Unsteadiness on feet: Secondary | ICD-10-CM | POA: Diagnosis not present

## 2021-03-13 DIAGNOSIS — R262 Difficulty in walking, not elsewhere classified: Secondary | ICD-10-CM | POA: Diagnosis not present

## 2021-03-13 DIAGNOSIS — M6281 Muscle weakness (generalized): Secondary | ICD-10-CM | POA: Diagnosis not present

## 2021-03-13 DIAGNOSIS — M25551 Pain in right hip: Secondary | ICD-10-CM | POA: Diagnosis not present

## 2021-03-13 DIAGNOSIS — R2681 Unsteadiness on feet: Secondary | ICD-10-CM | POA: Diagnosis not present

## 2021-03-15 DIAGNOSIS — M25551 Pain in right hip: Secondary | ICD-10-CM | POA: Diagnosis not present

## 2021-03-15 DIAGNOSIS — M6281 Muscle weakness (generalized): Secondary | ICD-10-CM | POA: Diagnosis not present

## 2021-03-15 DIAGNOSIS — R2681 Unsteadiness on feet: Secondary | ICD-10-CM | POA: Diagnosis not present

## 2021-03-15 DIAGNOSIS — R262 Difficulty in walking, not elsewhere classified: Secondary | ICD-10-CM | POA: Diagnosis not present

## 2021-03-18 DIAGNOSIS — Z20828 Contact with and (suspected) exposure to other viral communicable diseases: Secondary | ICD-10-CM | POA: Diagnosis not present

## 2021-03-18 DIAGNOSIS — Z1159 Encounter for screening for other viral diseases: Secondary | ICD-10-CM | POA: Diagnosis not present

## 2021-03-22 DIAGNOSIS — M25551 Pain in right hip: Secondary | ICD-10-CM | POA: Diagnosis not present

## 2021-03-22 DIAGNOSIS — R262 Difficulty in walking, not elsewhere classified: Secondary | ICD-10-CM | POA: Diagnosis not present

## 2021-03-22 DIAGNOSIS — M6281 Muscle weakness (generalized): Secondary | ICD-10-CM | POA: Diagnosis not present

## 2021-03-22 DIAGNOSIS — R2681 Unsteadiness on feet: Secondary | ICD-10-CM | POA: Diagnosis not present

## 2021-03-25 DIAGNOSIS — Z20828 Contact with and (suspected) exposure to other viral communicable diseases: Secondary | ICD-10-CM | POA: Diagnosis not present

## 2021-03-25 DIAGNOSIS — Z1159 Encounter for screening for other viral diseases: Secondary | ICD-10-CM | POA: Diagnosis not present

## 2021-03-27 DIAGNOSIS — R2681 Unsteadiness on feet: Secondary | ICD-10-CM | POA: Diagnosis not present

## 2021-03-27 DIAGNOSIS — R262 Difficulty in walking, not elsewhere classified: Secondary | ICD-10-CM | POA: Diagnosis not present

## 2021-03-27 DIAGNOSIS — M25551 Pain in right hip: Secondary | ICD-10-CM | POA: Diagnosis not present

## 2021-03-27 DIAGNOSIS — M6281 Muscle weakness (generalized): Secondary | ICD-10-CM | POA: Diagnosis not present

## 2021-03-29 DIAGNOSIS — R2681 Unsteadiness on feet: Secondary | ICD-10-CM | POA: Diagnosis not present

## 2021-03-29 DIAGNOSIS — M6281 Muscle weakness (generalized): Secondary | ICD-10-CM | POA: Diagnosis not present

## 2021-03-29 DIAGNOSIS — R262 Difficulty in walking, not elsewhere classified: Secondary | ICD-10-CM | POA: Diagnosis not present

## 2021-03-29 DIAGNOSIS — M25551 Pain in right hip: Secondary | ICD-10-CM | POA: Diagnosis not present

## 2021-04-01 DIAGNOSIS — R262 Difficulty in walking, not elsewhere classified: Secondary | ICD-10-CM | POA: Diagnosis not present

## 2021-04-01 DIAGNOSIS — M6281 Muscle weakness (generalized): Secondary | ICD-10-CM | POA: Diagnosis not present

## 2021-04-01 DIAGNOSIS — M25551 Pain in right hip: Secondary | ICD-10-CM | POA: Diagnosis not present

## 2021-04-01 DIAGNOSIS — R2681 Unsteadiness on feet: Secondary | ICD-10-CM | POA: Diagnosis not present

## 2021-04-01 DIAGNOSIS — Z1159 Encounter for screening for other viral diseases: Secondary | ICD-10-CM | POA: Diagnosis not present

## 2021-04-01 DIAGNOSIS — Z20828 Contact with and (suspected) exposure to other viral communicable diseases: Secondary | ICD-10-CM | POA: Diagnosis not present

## 2021-04-03 DIAGNOSIS — M6281 Muscle weakness (generalized): Secondary | ICD-10-CM | POA: Diagnosis not present

## 2021-04-03 DIAGNOSIS — R262 Difficulty in walking, not elsewhere classified: Secondary | ICD-10-CM | POA: Diagnosis not present

## 2021-04-03 DIAGNOSIS — M25551 Pain in right hip: Secondary | ICD-10-CM | POA: Diagnosis not present

## 2021-04-03 DIAGNOSIS — R2681 Unsteadiness on feet: Secondary | ICD-10-CM | POA: Diagnosis not present

## 2021-04-05 DIAGNOSIS — M6281 Muscle weakness (generalized): Secondary | ICD-10-CM | POA: Diagnosis not present

## 2021-04-05 DIAGNOSIS — M25551 Pain in right hip: Secondary | ICD-10-CM | POA: Diagnosis not present

## 2021-04-05 DIAGNOSIS — R2681 Unsteadiness on feet: Secondary | ICD-10-CM | POA: Diagnosis not present

## 2021-04-05 DIAGNOSIS — R262 Difficulty in walking, not elsewhere classified: Secondary | ICD-10-CM | POA: Diagnosis not present

## 2021-04-08 DIAGNOSIS — Z20828 Contact with and (suspected) exposure to other viral communicable diseases: Secondary | ICD-10-CM | POA: Diagnosis not present

## 2021-04-08 DIAGNOSIS — Z1159 Encounter for screening for other viral diseases: Secondary | ICD-10-CM | POA: Diagnosis not present

## 2021-04-10 DIAGNOSIS — M25551 Pain in right hip: Secondary | ICD-10-CM | POA: Diagnosis not present

## 2021-04-10 DIAGNOSIS — R2681 Unsteadiness on feet: Secondary | ICD-10-CM | POA: Diagnosis not present

## 2021-04-10 DIAGNOSIS — M6281 Muscle weakness (generalized): Secondary | ICD-10-CM | POA: Diagnosis not present

## 2021-04-10 DIAGNOSIS — R262 Difficulty in walking, not elsewhere classified: Secondary | ICD-10-CM | POA: Diagnosis not present

## 2021-04-12 DIAGNOSIS — R262 Difficulty in walking, not elsewhere classified: Secondary | ICD-10-CM | POA: Diagnosis not present

## 2021-04-12 DIAGNOSIS — M25551 Pain in right hip: Secondary | ICD-10-CM | POA: Diagnosis not present

## 2021-04-12 DIAGNOSIS — R2681 Unsteadiness on feet: Secondary | ICD-10-CM | POA: Diagnosis not present

## 2021-04-12 DIAGNOSIS — M6281 Muscle weakness (generalized): Secondary | ICD-10-CM | POA: Diagnosis not present

## 2021-04-15 DIAGNOSIS — R262 Difficulty in walking, not elsewhere classified: Secondary | ICD-10-CM | POA: Diagnosis not present

## 2021-04-15 DIAGNOSIS — Z1159 Encounter for screening for other viral diseases: Secondary | ICD-10-CM | POA: Diagnosis not present

## 2021-04-15 DIAGNOSIS — Z20828 Contact with and (suspected) exposure to other viral communicable diseases: Secondary | ICD-10-CM | POA: Diagnosis not present

## 2021-04-15 DIAGNOSIS — R2681 Unsteadiness on feet: Secondary | ICD-10-CM | POA: Diagnosis not present

## 2021-04-15 DIAGNOSIS — M6281 Muscle weakness (generalized): Secondary | ICD-10-CM | POA: Diagnosis not present

## 2021-04-15 DIAGNOSIS — M25551 Pain in right hip: Secondary | ICD-10-CM | POA: Diagnosis not present

## 2021-04-17 DIAGNOSIS — M6281 Muscle weakness (generalized): Secondary | ICD-10-CM | POA: Diagnosis not present

## 2021-04-17 DIAGNOSIS — R2681 Unsteadiness on feet: Secondary | ICD-10-CM | POA: Diagnosis not present

## 2021-04-17 DIAGNOSIS — M25551 Pain in right hip: Secondary | ICD-10-CM | POA: Diagnosis not present

## 2021-04-17 DIAGNOSIS — R262 Difficulty in walking, not elsewhere classified: Secondary | ICD-10-CM | POA: Diagnosis not present

## 2021-04-22 DIAGNOSIS — M25551 Pain in right hip: Secondary | ICD-10-CM | POA: Diagnosis not present

## 2021-04-22 DIAGNOSIS — R262 Difficulty in walking, not elsewhere classified: Secondary | ICD-10-CM | POA: Diagnosis not present

## 2021-04-22 DIAGNOSIS — M6281 Muscle weakness (generalized): Secondary | ICD-10-CM | POA: Diagnosis not present

## 2021-04-22 DIAGNOSIS — R2681 Unsteadiness on feet: Secondary | ICD-10-CM | POA: Diagnosis not present

## 2021-04-22 DIAGNOSIS — Z20828 Contact with and (suspected) exposure to other viral communicable diseases: Secondary | ICD-10-CM | POA: Diagnosis not present

## 2021-04-24 DIAGNOSIS — R2681 Unsteadiness on feet: Secondary | ICD-10-CM | POA: Diagnosis not present

## 2021-04-24 DIAGNOSIS — K5900 Constipation, unspecified: Secondary | ICD-10-CM | POA: Diagnosis not present

## 2021-04-24 DIAGNOSIS — M25551 Pain in right hip: Secondary | ICD-10-CM | POA: Diagnosis not present

## 2021-04-24 DIAGNOSIS — K7689 Other specified diseases of liver: Secondary | ICD-10-CM | POA: Diagnosis not present

## 2021-04-24 DIAGNOSIS — Z1211 Encounter for screening for malignant neoplasm of colon: Secondary | ICD-10-CM | POA: Diagnosis not present

## 2021-04-24 DIAGNOSIS — R262 Difficulty in walking, not elsewhere classified: Secondary | ICD-10-CM | POA: Diagnosis not present

## 2021-04-24 DIAGNOSIS — M6281 Muscle weakness (generalized): Secondary | ICD-10-CM | POA: Diagnosis not present

## 2021-04-29 DIAGNOSIS — M6281 Muscle weakness (generalized): Secondary | ICD-10-CM | POA: Diagnosis not present

## 2021-04-29 DIAGNOSIS — R2681 Unsteadiness on feet: Secondary | ICD-10-CM | POA: Diagnosis not present

## 2021-04-29 DIAGNOSIS — J449 Chronic obstructive pulmonary disease, unspecified: Secondary | ICD-10-CM | POA: Diagnosis not present

## 2021-04-29 DIAGNOSIS — M25551 Pain in right hip: Secondary | ICD-10-CM | POA: Diagnosis not present

## 2021-04-29 DIAGNOSIS — I509 Heart failure, unspecified: Secondary | ICD-10-CM | POA: Diagnosis not present

## 2021-04-29 DIAGNOSIS — Z20828 Contact with and (suspected) exposure to other viral communicable diseases: Secondary | ICD-10-CM | POA: Diagnosis not present

## 2021-04-29 DIAGNOSIS — R262 Difficulty in walking, not elsewhere classified: Secondary | ICD-10-CM | POA: Diagnosis not present

## 2021-05-01 DIAGNOSIS — M25551 Pain in right hip: Secondary | ICD-10-CM | POA: Diagnosis not present

## 2021-05-01 DIAGNOSIS — M6281 Muscle weakness (generalized): Secondary | ICD-10-CM | POA: Diagnosis not present

## 2021-05-01 DIAGNOSIS — R2681 Unsteadiness on feet: Secondary | ICD-10-CM | POA: Diagnosis not present

## 2021-05-01 DIAGNOSIS — R262 Difficulty in walking, not elsewhere classified: Secondary | ICD-10-CM | POA: Diagnosis not present

## 2021-05-06 DIAGNOSIS — Z8616 Personal history of COVID-19: Secondary | ICD-10-CM | POA: Diagnosis not present

## 2021-05-08 DIAGNOSIS — M6281 Muscle weakness (generalized): Secondary | ICD-10-CM | POA: Diagnosis not present

## 2021-05-08 DIAGNOSIS — R2681 Unsteadiness on feet: Secondary | ICD-10-CM | POA: Diagnosis not present

## 2021-05-08 DIAGNOSIS — R262 Difficulty in walking, not elsewhere classified: Secondary | ICD-10-CM | POA: Diagnosis not present

## 2021-05-08 DIAGNOSIS — M25551 Pain in right hip: Secondary | ICD-10-CM | POA: Diagnosis not present

## 2021-05-10 DIAGNOSIS — M25551 Pain in right hip: Secondary | ICD-10-CM | POA: Diagnosis not present

## 2021-05-10 DIAGNOSIS — R2681 Unsteadiness on feet: Secondary | ICD-10-CM | POA: Diagnosis not present

## 2021-05-10 DIAGNOSIS — M6281 Muscle weakness (generalized): Secondary | ICD-10-CM | POA: Diagnosis not present

## 2021-05-10 DIAGNOSIS — R262 Difficulty in walking, not elsewhere classified: Secondary | ICD-10-CM | POA: Diagnosis not present

## 2021-05-13 DIAGNOSIS — Z8616 Personal history of COVID-19: Secondary | ICD-10-CM | POA: Diagnosis not present

## 2021-05-13 DIAGNOSIS — M25551 Pain in right hip: Secondary | ICD-10-CM | POA: Diagnosis not present

## 2021-05-13 DIAGNOSIS — M6281 Muscle weakness (generalized): Secondary | ICD-10-CM | POA: Diagnosis not present

## 2021-05-13 DIAGNOSIS — R2681 Unsteadiness on feet: Secondary | ICD-10-CM | POA: Diagnosis not present

## 2021-05-13 DIAGNOSIS — R262 Difficulty in walking, not elsewhere classified: Secondary | ICD-10-CM | POA: Diagnosis not present

## 2021-05-15 DIAGNOSIS — R2681 Unsteadiness on feet: Secondary | ICD-10-CM | POA: Diagnosis not present

## 2021-05-15 DIAGNOSIS — M25551 Pain in right hip: Secondary | ICD-10-CM | POA: Diagnosis not present

## 2021-05-15 DIAGNOSIS — M6281 Muscle weakness (generalized): Secondary | ICD-10-CM | POA: Diagnosis not present

## 2021-05-15 DIAGNOSIS — R262 Difficulty in walking, not elsewhere classified: Secondary | ICD-10-CM | POA: Diagnosis not present

## 2021-05-20 DIAGNOSIS — Z20828 Contact with and (suspected) exposure to other viral communicable diseases: Secondary | ICD-10-CM | POA: Diagnosis not present

## 2021-05-22 DIAGNOSIS — M6281 Muscle weakness (generalized): Secondary | ICD-10-CM | POA: Diagnosis not present

## 2021-05-22 DIAGNOSIS — R262 Difficulty in walking, not elsewhere classified: Secondary | ICD-10-CM | POA: Diagnosis not present

## 2021-05-22 DIAGNOSIS — R2681 Unsteadiness on feet: Secondary | ICD-10-CM | POA: Diagnosis not present

## 2021-05-22 DIAGNOSIS — M25551 Pain in right hip: Secondary | ICD-10-CM | POA: Diagnosis not present

## 2021-05-24 DIAGNOSIS — M25551 Pain in right hip: Secondary | ICD-10-CM | POA: Diagnosis not present

## 2021-05-24 DIAGNOSIS — R262 Difficulty in walking, not elsewhere classified: Secondary | ICD-10-CM | POA: Diagnosis not present

## 2021-05-24 DIAGNOSIS — R2681 Unsteadiness on feet: Secondary | ICD-10-CM | POA: Diagnosis not present

## 2021-05-24 DIAGNOSIS — M6281 Muscle weakness (generalized): Secondary | ICD-10-CM | POA: Diagnosis not present

## 2021-05-27 DIAGNOSIS — M25551 Pain in right hip: Secondary | ICD-10-CM | POA: Diagnosis not present

## 2021-05-27 DIAGNOSIS — R2681 Unsteadiness on feet: Secondary | ICD-10-CM | POA: Diagnosis not present

## 2021-05-27 DIAGNOSIS — M6281 Muscle weakness (generalized): Secondary | ICD-10-CM | POA: Diagnosis not present

## 2021-05-27 DIAGNOSIS — U071 COVID-19: Secondary | ICD-10-CM

## 2021-05-27 DIAGNOSIS — Z8616 Personal history of COVID-19: Secondary | ICD-10-CM | POA: Diagnosis not present

## 2021-05-27 DIAGNOSIS — R262 Difficulty in walking, not elsewhere classified: Secondary | ICD-10-CM | POA: Diagnosis not present

## 2021-05-27 HISTORY — DX: COVID-19: U07.1

## 2021-05-30 ENCOUNTER — Ambulatory Visit: Payer: Medicare PPO | Admitting: Neurology

## 2021-06-03 ENCOUNTER — Ambulatory Visit: Payer: Medicare PPO | Admitting: Neurology

## 2021-06-03 DIAGNOSIS — Z20828 Contact with and (suspected) exposure to other viral communicable diseases: Secondary | ICD-10-CM | POA: Diagnosis not present

## 2021-06-04 ENCOUNTER — Telehealth: Payer: Self-pay | Admitting: Neurology

## 2021-06-04 DIAGNOSIS — M6281 Muscle weakness (generalized): Secondary | ICD-10-CM | POA: Diagnosis not present

## 2021-06-04 DIAGNOSIS — M25551 Pain in right hip: Secondary | ICD-10-CM | POA: Diagnosis not present

## 2021-06-04 DIAGNOSIS — R2681 Unsteadiness on feet: Secondary | ICD-10-CM | POA: Diagnosis not present

## 2021-06-04 DIAGNOSIS — R262 Difficulty in walking, not elsewhere classified: Secondary | ICD-10-CM | POA: Diagnosis not present

## 2021-06-04 NOTE — Telephone Encounter (Signed)
I called pt and left a vm to call back so pt could be assisted with rescheduling his appt.   If pt calls back please offer 10/3 at 130. This slot is on hold for this pt.

## 2021-06-04 NOTE — Telephone Encounter (Signed)
Pt called wanting to reschedule his appt, he is wanting to see Dr. Anne Hahn before he leaves. Where can I schedule this pt. I will call him to reschedule.

## 2021-06-04 NOTE — Telephone Encounter (Signed)
I

## 2021-06-10 DIAGNOSIS — Z8616 Personal history of COVID-19: Secondary | ICD-10-CM | POA: Diagnosis not present

## 2021-06-12 DIAGNOSIS — N1831 Chronic kidney disease, stage 3a: Secondary | ICD-10-CM | POA: Diagnosis not present

## 2021-06-12 DIAGNOSIS — I700 Atherosclerosis of aorta: Secondary | ICD-10-CM | POA: Diagnosis not present

## 2021-06-12 DIAGNOSIS — F17210 Nicotine dependence, cigarettes, uncomplicated: Secondary | ICD-10-CM | POA: Diagnosis not present

## 2021-06-12 DIAGNOSIS — R030 Elevated blood-pressure reading, without diagnosis of hypertension: Secondary | ICD-10-CM | POA: Diagnosis not present

## 2021-06-12 DIAGNOSIS — Z0000 Encounter for general adult medical examination without abnormal findings: Secondary | ICD-10-CM | POA: Diagnosis not present

## 2021-06-12 DIAGNOSIS — G629 Polyneuropathy, unspecified: Secondary | ICD-10-CM | POA: Diagnosis not present

## 2021-06-12 DIAGNOSIS — Z23 Encounter for immunization: Secondary | ICD-10-CM | POA: Diagnosis not present

## 2021-06-12 DIAGNOSIS — Z1389 Encounter for screening for other disorder: Secondary | ICD-10-CM | POA: Diagnosis not present

## 2021-06-12 DIAGNOSIS — I5030 Unspecified diastolic (congestive) heart failure: Secondary | ICD-10-CM | POA: Diagnosis not present

## 2021-06-13 ENCOUNTER — Encounter: Payer: Self-pay | Admitting: Neurology

## 2021-06-13 ENCOUNTER — Ambulatory Visit: Payer: Medicare PPO | Admitting: Neurology

## 2021-06-13 VITALS — BP 150/89 | HR 85 | Ht 75.0 in | Wt 198.0 lb

## 2021-06-13 DIAGNOSIS — G609 Hereditary and idiopathic neuropathy, unspecified: Secondary | ICD-10-CM

## 2021-06-13 DIAGNOSIS — R269 Unspecified abnormalities of gait and mobility: Secondary | ICD-10-CM | POA: Diagnosis not present

## 2021-06-13 NOTE — Progress Notes (Signed)
Reason for visit: Peripheral neuropathy, gait disturbance  Hector Beltran is an 87 y.o. male  History of present illness:  Hector Beltran is a 87 year old right-handed white male with a history of a peripheral neuropathy.  He comes in for an annual evaluation.  He is not having a lot of pain with the neuropathy.  He recently had a COVID infection on 27 May 2021, he reported no significant symptoms from this other than some mild fatigue.  He has noted ongoing issues with balance, he has not had any falls.  He uses a walker for ambulation.  He tries to exercise on a regular basis.  He is having some increasing problems with fine motor control with the hands, he has difficulty buttoning buttons and using his toothbrush.  He has some low back stiffness without pain going down the legs on either side.  He returns to this office for an evaluation.  Past Medical History:  Diagnosis Date   COVID 05/27/2021   Gait abnormality 02/09/2020   Peripheral neuropathy 09/30/2017    Past Surgical History:  Procedure Laterality Date   CYST REMOVAL NECK      History reviewed. No pertinent family history.  Social history:  reports that he quit smoking about 9 months ago. His smoking use included cigarettes. He has never used smokeless tobacco. He reports current alcohol use of about 3.0 standard drinks per week. He reports that he does not use drugs.   No Known Allergies  Medications:  Prior to Admission medications   Medication Sig Start Date End Date Taking? Authorizing Provider  albuterol (PROVENTIL) (2.5 MG/3ML) 0.083% nebulizer solution Take 3 mLs (2.5 mg total) by nebulization every 2 (two) hours as needed for shortness of breath. 08/31/20   Joseph Art, DO  B Complex Vitamins (B COMPLEX PO) Take 1 tablet by mouth daily.    [provider]  Coenzyme Q10 (CO Q-10 PO) Take 1 capsule by mouth daily.    [provider]  docusate sodium (COLACE) 100 MG capsule Take 1 capsule  (100 mg total) by mouth daily. 08/31/20   Joseph Art, DO  feeding supplement (ENSURE ENLIVE / ENSURE PLUS) LIQD Take 237 mLs by mouth 2 (two) times daily between meals. 08/31/20   Joseph Art, DO  furosemide (LASIX) 40 MG tablet Take 1 tablet (40 mg total) by mouth daily. 08/31/20   Joseph Art, DO  mometasone-formoterol (DULERA) 200-5 MCG/ACT AERO Inhale 2 puffs into the lungs 2 (two) times daily. 08/31/20   Joseph Art, DO  nystatin (MYCOSTATIN/NYSTOP) powder Apply topically 2 (two) times daily. 08/31/20   Joseph Art, DO    ROS:  Out of a complete 14 system review of symptoms, the patient complains only of the following symptoms, and all other reviewed systems are negative.  Difficulty with walking Fine motor control problems with hands  Blood pressure (!) 150/89, pulse 85, height 6\' 3"  (1.905 m), weight 198 lb (89.8 kg).  Physical Exam  General: The patient is alert and cooperative at the time of the examination.  Skin: 1+ edema below the knees is seen bilaterally.   Neurologic Exam  Mental status: The patient is alert and oriented x 3 at the time of the examination. The patient has apparent normal recent and remote memory, with an apparently normal attention span and concentration ability.   Cranial nerves: Facial symmetry is present. Speech is normal, no aphasia or dysarthria is noted. Extraocular movements are full.  Visual fields are full.  Motor: The patient has good strength in all 4 extremities, with exception of 4/5 strength with hip flexion bilaterally.  Sensory examination: Soft touch sensation is symmetric on the face, arms, and legs.  There is a stocking pattern pinprick sensory deficit in the distal third of the legs bilaterally.  Coordination: The patient has good finger-nose-finger and heel-to-shin bilaterally.  Gait and station: The patient has a shuffling gait pattern, unsteady.  With a walker, he has better stride and turns.  Tandem gait  was not attempted.  Romberg is negative but is unsteady.  Reflexes: Deep tendon reflexes are symmetric, but are depressed.   Assessment/Plan:  1.  Peripheral neuropathy  2.  Gait disturbance  The patient will be followed off and on for his neuropathy.  He is remained fairly stable with his physical abilities.  He is being safe with ambulation and he is exercising on a regular basis.  He does not require any medications for discomfort associated with neuropathy.  He will follow-up in 1 year, he may be followed through Dr. Terrace Arabia.  Marlan Palau MD 06/13/2021 3:14 PM  [facility] [address] [place], [place] [address]  Phone [phone] Fax [phone]

## 2021-06-17 DIAGNOSIS — Z8616 Personal history of COVID-19: Secondary | ICD-10-CM | POA: Diagnosis not present

## 2021-06-20 DIAGNOSIS — H524 Presbyopia: Secondary | ICD-10-CM | POA: Diagnosis not present

## 2021-06-20 DIAGNOSIS — H2513 Age-related nuclear cataract, bilateral: Secondary | ICD-10-CM | POA: Diagnosis not present

## 2021-06-24 DIAGNOSIS — Z20828 Contact with and (suspected) exposure to other viral communicable diseases: Secondary | ICD-10-CM | POA: Diagnosis not present

## 2021-07-01 DIAGNOSIS — Z8616 Personal history of COVID-19: Secondary | ICD-10-CM | POA: Diagnosis not present

## 2021-07-10 DIAGNOSIS — Z20828 Contact with and (suspected) exposure to other viral communicable diseases: Secondary | ICD-10-CM | POA: Diagnosis not present

## 2021-07-15 DIAGNOSIS — Z8616 Personal history of COVID-19: Secondary | ICD-10-CM | POA: Diagnosis not present

## 2021-07-22 DIAGNOSIS — Z20822 Contact with and (suspected) exposure to covid-19: Secondary | ICD-10-CM | POA: Diagnosis not present

## 2021-07-26 DIAGNOSIS — K7689 Other specified diseases of liver: Secondary | ICD-10-CM | POA: Diagnosis not present

## 2021-07-26 DIAGNOSIS — K5900 Constipation, unspecified: Secondary | ICD-10-CM | POA: Diagnosis not present

## 2021-07-29 DIAGNOSIS — Z20828 Contact with and (suspected) exposure to other viral communicable diseases: Secondary | ICD-10-CM | POA: Diagnosis not present

## 2021-08-05 DIAGNOSIS — Z20822 Contact with and (suspected) exposure to covid-19: Secondary | ICD-10-CM | POA: Diagnosis not present

## 2021-08-12 DIAGNOSIS — Z20828 Contact with and (suspected) exposure to other viral communicable diseases: Secondary | ICD-10-CM | POA: Diagnosis not present

## 2021-08-12 DIAGNOSIS — Z1159 Encounter for screening for other viral diseases: Secondary | ICD-10-CM | POA: Diagnosis not present

## 2021-08-19 DIAGNOSIS — Z1159 Encounter for screening for other viral diseases: Secondary | ICD-10-CM | POA: Diagnosis not present

## 2021-08-19 DIAGNOSIS — Z20828 Contact with and (suspected) exposure to other viral communicable diseases: Secondary | ICD-10-CM | POA: Diagnosis not present

## 2021-08-26 DIAGNOSIS — Z1159 Encounter for screening for other viral diseases: Secondary | ICD-10-CM | POA: Diagnosis not present

## 2021-08-26 DIAGNOSIS — Z20828 Contact with and (suspected) exposure to other viral communicable diseases: Secondary | ICD-10-CM | POA: Diagnosis not present

## 2021-08-28 DIAGNOSIS — Z20828 Contact with and (suspected) exposure to other viral communicable diseases: Secondary | ICD-10-CM | POA: Diagnosis not present

## 2021-08-28 DIAGNOSIS — Z1159 Encounter for screening for other viral diseases: Secondary | ICD-10-CM | POA: Diagnosis not present

## 2021-08-30 DIAGNOSIS — Z20828 Contact with and (suspected) exposure to other viral communicable diseases: Secondary | ICD-10-CM | POA: Diagnosis not present

## 2021-08-30 DIAGNOSIS — Z1159 Encounter for screening for other viral diseases: Secondary | ICD-10-CM | POA: Diagnosis not present

## 2021-09-02 DIAGNOSIS — Z20828 Contact with and (suspected) exposure to other viral communicable diseases: Secondary | ICD-10-CM | POA: Diagnosis not present

## 2021-09-02 DIAGNOSIS — Z1159 Encounter for screening for other viral diseases: Secondary | ICD-10-CM | POA: Diagnosis not present

## 2021-09-09 DIAGNOSIS — Z20828 Contact with and (suspected) exposure to other viral communicable diseases: Secondary | ICD-10-CM | POA: Diagnosis not present

## 2021-09-09 DIAGNOSIS — Z1159 Encounter for screening for other viral diseases: Secondary | ICD-10-CM | POA: Diagnosis not present

## 2021-09-11 DIAGNOSIS — Z1159 Encounter for screening for other viral diseases: Secondary | ICD-10-CM | POA: Diagnosis not present

## 2021-09-11 DIAGNOSIS — Z20828 Contact with and (suspected) exposure to other viral communicable diseases: Secondary | ICD-10-CM | POA: Diagnosis not present

## 2021-09-16 DIAGNOSIS — Z20822 Contact with and (suspected) exposure to covid-19: Secondary | ICD-10-CM | POA: Diagnosis not present

## 2021-09-18 DIAGNOSIS — Z20828 Contact with and (suspected) exposure to other viral communicable diseases: Secondary | ICD-10-CM | POA: Diagnosis not present

## 2021-09-18 DIAGNOSIS — Z1159 Encounter for screening for other viral diseases: Secondary | ICD-10-CM | POA: Diagnosis not present

## 2021-09-23 DIAGNOSIS — Z20822 Contact with and (suspected) exposure to covid-19: Secondary | ICD-10-CM | POA: Diagnosis not present

## 2021-09-25 ENCOUNTER — Encounter (HOSPITAL_COMMUNITY): Payer: Self-pay | Admitting: Emergency Medicine

## 2021-09-25 ENCOUNTER — Emergency Department (HOSPITAL_COMMUNITY): Payer: Medicare PPO

## 2021-09-25 ENCOUNTER — Emergency Department (HOSPITAL_COMMUNITY)
Admission: EM | Admit: 2021-09-25 | Discharge: 2021-09-25 | Disposition: A | Discharge status: Home / Self Care | Payer: Medicare PPO | Attending: Emergency Medicine | Admitting: Emergency Medicine

## 2021-09-25 DIAGNOSIS — M4312 Spondylolisthesis, cervical region: Secondary | ICD-10-CM | POA: Diagnosis not present

## 2021-09-25 DIAGNOSIS — S2232XA Fracture of one rib, left side, initial encounter for closed fracture: Secondary | ICD-10-CM | POA: Diagnosis not present

## 2021-09-25 DIAGNOSIS — K7689 Other specified diseases of liver: Secondary | ICD-10-CM | POA: Insufficient documentation

## 2021-09-25 DIAGNOSIS — R000 Tachycardia, unspecified: Secondary | ICD-10-CM | POA: Diagnosis not present

## 2021-09-25 DIAGNOSIS — R58 Hemorrhage, not elsewhere classified: Secondary | ICD-10-CM | POA: Diagnosis not present

## 2021-09-25 DIAGNOSIS — S3993XA Unspecified injury of pelvis, initial encounter: Secondary | ICD-10-CM | POA: Diagnosis not present

## 2021-09-25 DIAGNOSIS — Q859 Phakomatosis, unspecified: Secondary | ICD-10-CM | POA: Diagnosis not present

## 2021-09-25 DIAGNOSIS — I2510 Atherosclerotic heart disease of native coronary artery without angina pectoris: Secondary | ICD-10-CM | POA: Diagnosis not present

## 2021-09-25 DIAGNOSIS — R4182 Altered mental status, unspecified: Secondary | ICD-10-CM | POA: Diagnosis present

## 2021-09-25 DIAGNOSIS — I10 Essential (primary) hypertension: Secondary | ICD-10-CM | POA: Diagnosis not present

## 2021-09-25 DIAGNOSIS — J984 Other disorders of lung: Secondary | ICD-10-CM | POA: Diagnosis not present

## 2021-09-25 DIAGNOSIS — S3991XA Unspecified injury of abdomen, initial encounter: Secondary | ICD-10-CM | POA: Diagnosis not present

## 2021-09-25 DIAGNOSIS — S0181XA Laceration without foreign body of other part of head, initial encounter: Secondary | ICD-10-CM | POA: Diagnosis not present

## 2021-09-25 DIAGNOSIS — I700 Atherosclerosis of aorta: Secondary | ICD-10-CM | POA: Insufficient documentation

## 2021-09-25 DIAGNOSIS — Z23 Encounter for immunization: Secondary | ICD-10-CM | POA: Insufficient documentation

## 2021-09-25 DIAGNOSIS — N281 Cyst of kidney, acquired: Secondary | ICD-10-CM | POA: Diagnosis not present

## 2021-09-25 DIAGNOSIS — Y92410 Unspecified street and highway as the place of occurrence of the external cause: Secondary | ICD-10-CM | POA: Diagnosis not present

## 2021-09-25 DIAGNOSIS — M47812 Spondylosis without myelopathy or radiculopathy, cervical region: Secondary | ICD-10-CM | POA: Diagnosis not present

## 2021-09-25 DIAGNOSIS — S299XXA Unspecified injury of thorax, initial encounter: Secondary | ICD-10-CM | POA: Diagnosis not present

## 2021-09-25 DIAGNOSIS — R9431 Abnormal electrocardiogram [ECG] [EKG]: Secondary | ICD-10-CM | POA: Diagnosis not present

## 2021-09-25 DIAGNOSIS — S2242XA Multiple fractures of ribs, left side, initial encounter for closed fracture: Secondary | ICD-10-CM | POA: Diagnosis not present

## 2021-09-25 DIAGNOSIS — S0003XA Contusion of scalp, initial encounter: Secondary | ICD-10-CM | POA: Diagnosis not present

## 2021-09-25 LAB — CBC
HCT: 49.5 % (ref 39.0–52.0)
Hemoglobin: 16.2 g/dL (ref 13.0–17.0)
MCH: 32.4 pg (ref 26.0–34.0)
MCHC: 32.7 g/dL (ref 30.0–36.0)
MCV: 99 fL (ref 80.0–100.0)
Platelets: 160 10*3/uL (ref 150–400)
RBC: 5 MIL/uL (ref 4.22–5.81)
RDW: 13.4 % (ref 11.5–15.5)
WBC: 7.6 10*3/uL (ref 4.0–10.5)
nRBC: 0 % (ref 0.0–0.2)

## 2021-09-25 LAB — COMPREHENSIVE METABOLIC PANEL
ALT: 17 U/L (ref 0–44)
AST: 26 U/L (ref 15–41)
Albumin: 3.8 g/dL (ref 3.5–5.0)
Alkaline Phosphatase: 65 U/L (ref 38–126)
Anion gap: 10 (ref 5–15)
BUN: 17 mg/dL (ref 8–23)
CO2: 28 mmol/L (ref 22–32)
Calcium: 8.9 mg/dL (ref 8.9–10.3)
Chloride: 98 mmol/L (ref 98–111)
Creatinine, Ser: 0.98 mg/dL (ref 0.61–1.24)
GFR, Estimated: >60 mL/min (ref >60)
Glucose, Bld: 93 mg/dL (ref 70–99)
Potassium: 4.3 mmol/L (ref 3.5–5.1)
Sodium: 136 mmol/L (ref 135–145)
Total Bilirubin: 0.9 mg/dL (ref 0.3–1.2)
Total Protein: 7.2 g/dL (ref 6.5–8.1)

## 2021-09-25 LAB — I-STAT CHEM 8, ED
BUN: 21 mg/dL (ref 8–23)
Calcium, Ion: 1.04 mmol/L — ABNORMAL LOW (ref 1.15–1.40)
Chloride: 100 mmol/L (ref 98–111)
Creatinine, Ser: 1 mg/dL (ref 0.61–1.24)
Glucose, Bld: 93 mg/dL (ref 70–99)
HCT: 50 % (ref 39.0–52.0)
Hemoglobin: 17 g/dL (ref 13.0–17.0)
Potassium: 4.2 mmol/L (ref 3.5–5.1)
Sodium: 138 mmol/L (ref 135–145)
TCO2: 30 mmol/L (ref 22–32)

## 2021-09-25 LAB — SAMPLE TO BLOOD BANK

## 2021-09-25 LAB — PROTIME-INR
INR: 0.9 (ref 0.8–1.2)
Prothrombin Time: 12.2 seconds (ref 11.4–15.2)

## 2021-09-25 LAB — ETHANOL: Alcohol, Ethyl (B): <10 mg/dL (ref <10)

## 2021-09-25 MED ORDER — LIDOCAINE-EPINEPHRINE (PF) 2 %-1:200000 IJ SOLN
10.0000 mL | Freq: Once | INTRAMUSCULAR | Status: AC
  Administered 2021-09-25: 10 mL
  Filled 2021-09-25: qty 20

## 2021-09-25 MED ORDER — HYDROCODONE-ACETAMINOPHEN 5-325 MG PO TABS: 1.0000 | ORAL_TABLET | Freq: Four times a day (QID) | ORAL | 0 refills | Status: DC | PRN

## 2021-09-25 MED ORDER — TETANUS-DIPHTH-ACELL PERTUSSIS 5-2.5-18.5 LF-MCG/0.5 IM SUSY
0.5000 mL | PREFILLED_SYRINGE | Freq: Once | INTRAMUSCULAR | Status: AC
  Administered 2021-09-25: 0.5 mL via INTRAMUSCULAR
  Filled 2021-09-25: qty 0.5

## 2021-09-25 MED ORDER — DOCUSATE SODIUM 250 MG PO CAPS: 250.0000 mg | ORAL_CAPSULE | Freq: Every day | ORAL | 0 refills | Status: DC

## 2021-09-25 MED ORDER — IOHEXOL 350 MG/ML SOLN
100.0000 mL | Freq: Once | INTRAVENOUS | Status: AC | PRN
  Administered 2021-09-25: 100 mL via INTRAVENOUS

## 2021-09-25 NOTE — Discharge Instructions (Signed)
Your x-rays did show 1 rib fracture.  Take medications as needed for pain.  Return to the ED if you start having shortness of breath or worsening symptoms.  The x-ray of your head showed the possibility of a foreign body in the laceration.  I could not find a foreign body in the wound.  It is possible that we irrigated out during the procedure.  Return to the ED to be rechecked if you are having trouble with increasing pain drainage.  Continue to apply antibiotic ointment to the wound daily.  The sutures should be removed in 5 to 7 days

## 2021-09-25 NOTE — ED Notes (Signed)
Patient transported to CT 
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
## 2021-09-25 NOTE — ED Notes (Signed)
Family updated as to patient's status by Jim Like, TRN

## 2021-09-25 NOTE — ED Notes (Signed)
Pt able to eat, drink and tolerate well. Pt able to ambulate with staff for assist. Slight endorsement of dizziness.
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
## 2021-09-25 NOTE — ED Notes (Signed)
Pt became SOB during CT. Pt SpO2 dropped to 88%RA. Placed on 2 liters with increase to 96%
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
## 2021-09-30 DIAGNOSIS — Z20822 Contact with and (suspected) exposure to covid-19: Secondary | ICD-10-CM | POA: Diagnosis not present

## 2021-10-07 ENCOUNTER — Encounter: Payer: Self-pay | Admitting: Neurology

## 2021-10-07 DIAGNOSIS — Z20822 Contact with and (suspected) exposure to covid-19: Secondary | ICD-10-CM | POA: Diagnosis not present

## 2021-10-08 DIAGNOSIS — R946 Abnormal results of thyroid function studies: Secondary | ICD-10-CM | POA: Diagnosis not present

## 2021-10-08 DIAGNOSIS — R4182 Altered mental status, unspecified: Secondary | ICD-10-CM | POA: Diagnosis not present

## 2021-10-08 DIAGNOSIS — S0101XD Laceration without foreign body of scalp, subsequent encounter: Secondary | ICD-10-CM | POA: Diagnosis not present

## 2021-10-10 DIAGNOSIS — S233XXA Sprain of ligaments of thoracic spine, initial encounter: Secondary | ICD-10-CM | POA: Diagnosis not present

## 2021-10-10 DIAGNOSIS — S338XXA Sprain of other parts of lumbar spine and pelvis, initial encounter: Secondary | ICD-10-CM | POA: Diagnosis not present

## 2021-10-10 DIAGNOSIS — M9901 Segmental and somatic dysfunction of cervical region: Secondary | ICD-10-CM | POA: Diagnosis not present

## 2021-10-10 DIAGNOSIS — M9902 Segmental and somatic dysfunction of thoracic region: Secondary | ICD-10-CM | POA: Diagnosis not present

## 2021-10-10 DIAGNOSIS — S138XXA Sprain of joints and ligaments of other parts of neck, initial encounter: Secondary | ICD-10-CM | POA: Diagnosis not present

## 2021-10-10 DIAGNOSIS — M9903 Segmental and somatic dysfunction of lumbar region: Secondary | ICD-10-CM | POA: Diagnosis not present

## 2021-10-14 DIAGNOSIS — S233XXA Sprain of ligaments of thoracic spine, initial encounter: Secondary | ICD-10-CM | POA: Diagnosis not present

## 2021-10-14 DIAGNOSIS — M9902 Segmental and somatic dysfunction of thoracic region: Secondary | ICD-10-CM | POA: Diagnosis not present

## 2021-10-14 DIAGNOSIS — M9901 Segmental and somatic dysfunction of cervical region: Secondary | ICD-10-CM | POA: Diagnosis not present

## 2021-10-14 DIAGNOSIS — S338XXA Sprain of other parts of lumbar spine and pelvis, initial encounter: Secondary | ICD-10-CM | POA: Diagnosis not present

## 2021-10-14 DIAGNOSIS — S138XXA Sprain of joints and ligaments of other parts of neck, initial encounter: Secondary | ICD-10-CM | POA: Diagnosis not present

## 2021-10-14 DIAGNOSIS — M9903 Segmental and somatic dysfunction of lumbar region: Secondary | ICD-10-CM | POA: Diagnosis not present

## 2021-10-14 DIAGNOSIS — Z20828 Contact with and (suspected) exposure to other viral communicable diseases: Secondary | ICD-10-CM | POA: Diagnosis not present

## 2021-10-16 DIAGNOSIS — M9902 Segmental and somatic dysfunction of thoracic region: Secondary | ICD-10-CM | POA: Diagnosis not present

## 2021-10-16 DIAGNOSIS — S233XXA Sprain of ligaments of thoracic spine, initial encounter: Secondary | ICD-10-CM | POA: Diagnosis not present

## 2021-10-16 DIAGNOSIS — M9901 Segmental and somatic dysfunction of cervical region: Secondary | ICD-10-CM | POA: Diagnosis not present

## 2021-10-16 DIAGNOSIS — S138XXA Sprain of joints and ligaments of other parts of neck, initial encounter: Secondary | ICD-10-CM | POA: Diagnosis not present

## 2021-10-16 DIAGNOSIS — S338XXA Sprain of other parts of lumbar spine and pelvis, initial encounter: Secondary | ICD-10-CM | POA: Diagnosis not present

## 2021-10-16 DIAGNOSIS — M9903 Segmental and somatic dysfunction of lumbar region: Secondary | ICD-10-CM | POA: Diagnosis not present

## 2021-10-18 DIAGNOSIS — R4182 Altered mental status, unspecified: Secondary | ICD-10-CM | POA: Diagnosis not present

## 2021-10-22 DIAGNOSIS — I471 Supraventricular tachycardia: Secondary | ICD-10-CM | POA: Diagnosis not present

## 2021-10-22 DIAGNOSIS — R4182 Altered mental status, unspecified: Secondary | ICD-10-CM | POA: Diagnosis not present

## 2021-10-23 DIAGNOSIS — M9901 Segmental and somatic dysfunction of cervical region: Secondary | ICD-10-CM | POA: Diagnosis not present

## 2021-10-23 DIAGNOSIS — S233XXA Sprain of ligaments of thoracic spine, initial encounter: Secondary | ICD-10-CM | POA: Diagnosis not present

## 2021-10-23 DIAGNOSIS — M9902 Segmental and somatic dysfunction of thoracic region: Secondary | ICD-10-CM | POA: Diagnosis not present

## 2021-10-23 DIAGNOSIS — S338XXA Sprain of other parts of lumbar spine and pelvis, initial encounter: Secondary | ICD-10-CM | POA: Diagnosis not present

## 2021-10-23 DIAGNOSIS — M9903 Segmental and somatic dysfunction of lumbar region: Secondary | ICD-10-CM | POA: Diagnosis not present

## 2021-10-23 DIAGNOSIS — S138XXA Sprain of joints and ligaments of other parts of neck, initial encounter: Secondary | ICD-10-CM | POA: Diagnosis not present

## 2021-10-25 DIAGNOSIS — S233XXA Sprain of ligaments of thoracic spine, initial encounter: Secondary | ICD-10-CM | POA: Diagnosis not present

## 2021-10-25 DIAGNOSIS — M9902 Segmental and somatic dysfunction of thoracic region: Secondary | ICD-10-CM | POA: Diagnosis not present

## 2021-10-25 DIAGNOSIS — M9901 Segmental and somatic dysfunction of cervical region: Secondary | ICD-10-CM | POA: Diagnosis not present

## 2021-10-25 DIAGNOSIS — M9903 Segmental and somatic dysfunction of lumbar region: Secondary | ICD-10-CM | POA: Diagnosis not present

## 2021-10-25 DIAGNOSIS — S138XXA Sprain of joints and ligaments of other parts of neck, initial encounter: Secondary | ICD-10-CM | POA: Diagnosis not present

## 2021-10-25 DIAGNOSIS — S338XXA Sprain of other parts of lumbar spine and pelvis, initial encounter: Secondary | ICD-10-CM | POA: Diagnosis not present

## 2021-10-30 DIAGNOSIS — S338XXA Sprain of other parts of lumbar spine and pelvis, initial encounter: Secondary | ICD-10-CM | POA: Diagnosis not present

## 2021-10-30 DIAGNOSIS — M9902 Segmental and somatic dysfunction of thoracic region: Secondary | ICD-10-CM | POA: Diagnosis not present

## 2021-10-30 DIAGNOSIS — S233XXA Sprain of ligaments of thoracic spine, initial encounter: Secondary | ICD-10-CM | POA: Diagnosis not present

## 2021-10-30 DIAGNOSIS — M9903 Segmental and somatic dysfunction of lumbar region: Secondary | ICD-10-CM | POA: Diagnosis not present

## 2021-10-30 DIAGNOSIS — S138XXA Sprain of joints and ligaments of other parts of neck, initial encounter: Secondary | ICD-10-CM | POA: Diagnosis not present

## 2021-10-30 DIAGNOSIS — M9901 Segmental and somatic dysfunction of cervical region: Secondary | ICD-10-CM | POA: Diagnosis not present

## 2021-11-04 DIAGNOSIS — Z20822 Contact with and (suspected) exposure to covid-19: Secondary | ICD-10-CM | POA: Diagnosis not present

## 2021-11-07 DIAGNOSIS — M9903 Segmental and somatic dysfunction of lumbar region: Secondary | ICD-10-CM | POA: Diagnosis not present

## 2021-11-07 DIAGNOSIS — S233XXA Sprain of ligaments of thoracic spine, initial encounter: Secondary | ICD-10-CM | POA: Diagnosis not present

## 2021-11-07 DIAGNOSIS — M9901 Segmental and somatic dysfunction of cervical region: Secondary | ICD-10-CM | POA: Diagnosis not present

## 2021-11-07 DIAGNOSIS — M9902 Segmental and somatic dysfunction of thoracic region: Secondary | ICD-10-CM | POA: Diagnosis not present

## 2021-11-07 DIAGNOSIS — S338XXA Sprain of other parts of lumbar spine and pelvis, initial encounter: Secondary | ICD-10-CM | POA: Diagnosis not present

## 2021-11-07 DIAGNOSIS — S138XXA Sprain of joints and ligaments of other parts of neck, initial encounter: Secondary | ICD-10-CM | POA: Diagnosis not present

## 2021-11-11 DIAGNOSIS — Z8616 Personal history of COVID-19: Secondary | ICD-10-CM | POA: Diagnosis not present

## 2021-11-13 DIAGNOSIS — S138XXA Sprain of joints and ligaments of other parts of neck, initial encounter: Secondary | ICD-10-CM | POA: Diagnosis not present

## 2021-11-13 DIAGNOSIS — M9902 Segmental and somatic dysfunction of thoracic region: Secondary | ICD-10-CM | POA: Diagnosis not present

## 2021-11-13 DIAGNOSIS — S338XXA Sprain of other parts of lumbar spine and pelvis, initial encounter: Secondary | ICD-10-CM | POA: Diagnosis not present

## 2021-11-13 DIAGNOSIS — M9901 Segmental and somatic dysfunction of cervical region: Secondary | ICD-10-CM | POA: Diagnosis not present

## 2021-11-13 DIAGNOSIS — S233XXA Sprain of ligaments of thoracic spine, initial encounter: Secondary | ICD-10-CM | POA: Diagnosis not present

## 2021-11-13 DIAGNOSIS — M9903 Segmental and somatic dysfunction of lumbar region: Secondary | ICD-10-CM | POA: Diagnosis not present

## 2021-11-18 DIAGNOSIS — Z20822 Contact with and (suspected) exposure to covid-19: Secondary | ICD-10-CM | POA: Diagnosis not present

## 2021-11-18 DIAGNOSIS — R262 Difficulty in walking, not elsewhere classified: Secondary | ICD-10-CM | POA: Diagnosis not present

## 2021-11-18 DIAGNOSIS — R293 Abnormal posture: Secondary | ICD-10-CM | POA: Diagnosis not present

## 2021-11-18 DIAGNOSIS — M6281 Muscle weakness (generalized): Secondary | ICD-10-CM | POA: Diagnosis not present

## 2021-11-20 DIAGNOSIS — R293 Abnormal posture: Secondary | ICD-10-CM | POA: Diagnosis not present

## 2021-11-20 DIAGNOSIS — R262 Difficulty in walking, not elsewhere classified: Secondary | ICD-10-CM | POA: Diagnosis not present

## 2021-11-20 DIAGNOSIS — M6281 Muscle weakness (generalized): Secondary | ICD-10-CM | POA: Diagnosis not present

## 2021-11-20 DIAGNOSIS — R946 Abnormal results of thyroid function studies: Secondary | ICD-10-CM | POA: Diagnosis not present

## 2021-11-21 ENCOUNTER — Other Ambulatory Visit: Payer: Self-pay

## 2021-11-21 ENCOUNTER — Ambulatory Visit: Payer: Medicare PPO | Admitting: Internal Medicine

## 2021-11-21 ENCOUNTER — Encounter: Payer: Self-pay | Admitting: Internal Medicine

## 2021-11-21 VITALS — BP 154/81 | HR 90 | Ht 75.0 in | Wt 197.4 lb

## 2021-11-21 DIAGNOSIS — I2510 Atherosclerotic heart disease of native coronary artery without angina pectoris: Secondary | ICD-10-CM | POA: Diagnosis not present

## 2021-11-21 DIAGNOSIS — R55 Syncope and collapse: Secondary | ICD-10-CM

## 2021-11-21 DIAGNOSIS — I471 Supraventricular tachycardia, unspecified: Secondary | ICD-10-CM

## 2021-11-21 DIAGNOSIS — J449 Chronic obstructive pulmonary disease, unspecified: Secondary | ICD-10-CM

## 2021-11-21 DIAGNOSIS — I5031 Acute diastolic (congestive) heart failure: Secondary | ICD-10-CM | POA: Diagnosis not present

## 2021-11-21 DIAGNOSIS — I700 Atherosclerosis of aorta: Secondary | ICD-10-CM | POA: Diagnosis not present

## 2021-11-21 MED ORDER — DILTIAZEM HCL ER COATED BEADS 180 MG PO CP24: 180.0000 mg | ORAL_CAPSULE | Freq: Every day | ORAL | 3 refills | Status: DC

## 2021-11-21 NOTE — Patient Instructions (Signed)
Medication Instructions:  ?START cardizem 180 mg tablet by mouth daily at night.  ?*If you need a refill on your cardiac medications before your next appointment, please call your pharmacy* ? ? ?Lab Work: ?None today ?If you have labs (blood work) drawn today and your tests are completely normal, you will receive your results only by: ?MyChart Message (if you have MyChart) OR ?A paper copy in the mail ?If you have any lab test that is abnormal or we need to change your treatment, we will call you to review the results. ? ? ?Testing/Procedures: ?None today ? ? ?Follow-Up: ?At [facility], you and your health needs are our priority.  As part of our continuing mission to provide you with exceptional heart care, we have created designated Provider Care Teams.  These Care Teams include your primary Cardiologist (physician) and Advanced Practice Providers (APPs -  Physician Assistants and Nurse Practitioners) who all work together to provide you with the care you need, when you need it. ? ?We recommend signing up for the patient portal called "MyChart".  Sign up information is provided on this After Visit Summary.  MyChart is used to connect with patients for Virtual Visits (Telemedicine).  Patients are able to view lab/test results, encounter notes, upcoming appointments, etc.  Non-urgent messages can be sent to your provider as well.   ?To learn more about what you can do with MyChart, go to [email or web address].   ? ?Your next appointment:   ?3 -4 month(s) ? ?The format for your next appointment:   ?In Person ? ?Provider:   ?Early Osmond, MD   ? ? ? ?  ?
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
## 2021-11-22 DIAGNOSIS — R262 Difficulty in walking, not elsewhere classified: Secondary | ICD-10-CM | POA: Diagnosis not present

## 2021-11-22 DIAGNOSIS — R293 Abnormal posture: Secondary | ICD-10-CM | POA: Diagnosis not present

## 2021-11-22 DIAGNOSIS — M6281 Muscle weakness (generalized): Secondary | ICD-10-CM | POA: Diagnosis not present

## 2021-11-25 DIAGNOSIS — Z20822 Contact with and (suspected) exposure to covid-19: Secondary | ICD-10-CM | POA: Diagnosis not present

## 2021-11-25 DIAGNOSIS — R293 Abnormal posture: Secondary | ICD-10-CM | POA: Diagnosis not present

## 2021-11-25 DIAGNOSIS — M6281 Muscle weakness (generalized): Secondary | ICD-10-CM | POA: Diagnosis not present

## 2021-11-25 DIAGNOSIS — R262 Difficulty in walking, not elsewhere classified: Secondary | ICD-10-CM | POA: Diagnosis not present

## 2021-11-27 DIAGNOSIS — M6281 Muscle weakness (generalized): Secondary | ICD-10-CM | POA: Diagnosis not present

## 2021-11-27 DIAGNOSIS — R293 Abnormal posture: Secondary | ICD-10-CM | POA: Diagnosis not present

## 2021-11-27 DIAGNOSIS — S233XXA Sprain of ligaments of thoracic spine, initial encounter: Secondary | ICD-10-CM | POA: Diagnosis not present

## 2021-11-27 DIAGNOSIS — M9902 Segmental and somatic dysfunction of thoracic region: Secondary | ICD-10-CM | POA: Diagnosis not present

## 2021-11-27 DIAGNOSIS — R262 Difficulty in walking, not elsewhere classified: Secondary | ICD-10-CM | POA: Diagnosis not present

## 2021-11-27 DIAGNOSIS — S338XXA Sprain of other parts of lumbar spine and pelvis, initial encounter: Secondary | ICD-10-CM | POA: Diagnosis not present

## 2021-11-27 DIAGNOSIS — M9901 Segmental and somatic dysfunction of cervical region: Secondary | ICD-10-CM | POA: Diagnosis not present

## 2021-11-27 DIAGNOSIS — S138XXA Sprain of joints and ligaments of other parts of neck, initial encounter: Secondary | ICD-10-CM | POA: Diagnosis not present

## 2021-11-27 DIAGNOSIS — M9903 Segmental and somatic dysfunction of lumbar region: Secondary | ICD-10-CM | POA: Diagnosis not present

## 2021-12-02 DIAGNOSIS — R262 Difficulty in walking, not elsewhere classified: Secondary | ICD-10-CM | POA: Diagnosis not present

## 2021-12-02 DIAGNOSIS — M6281 Muscle weakness (generalized): Secondary | ICD-10-CM | POA: Diagnosis not present

## 2021-12-02 DIAGNOSIS — R293 Abnormal posture: Secondary | ICD-10-CM | POA: Diagnosis not present

## 2021-12-04 DIAGNOSIS — M6281 Muscle weakness (generalized): Secondary | ICD-10-CM | POA: Diagnosis not present

## 2021-12-04 DIAGNOSIS — M9903 Segmental and somatic dysfunction of lumbar region: Secondary | ICD-10-CM | POA: Diagnosis not present

## 2021-12-04 DIAGNOSIS — S138XXA Sprain of joints and ligaments of other parts of neck, initial encounter: Secondary | ICD-10-CM | POA: Diagnosis not present

## 2021-12-04 DIAGNOSIS — R262 Difficulty in walking, not elsewhere classified: Secondary | ICD-10-CM | POA: Diagnosis not present

## 2021-12-04 DIAGNOSIS — S233XXA Sprain of ligaments of thoracic spine, initial encounter: Secondary | ICD-10-CM | POA: Diagnosis not present

## 2021-12-04 DIAGNOSIS — M9902 Segmental and somatic dysfunction of thoracic region: Secondary | ICD-10-CM | POA: Diagnosis not present

## 2021-12-04 DIAGNOSIS — S338XXA Sprain of other parts of lumbar spine and pelvis, initial encounter: Secondary | ICD-10-CM | POA: Diagnosis not present

## 2021-12-04 DIAGNOSIS — M9901 Segmental and somatic dysfunction of cervical region: Secondary | ICD-10-CM | POA: Diagnosis not present

## 2021-12-04 DIAGNOSIS — R293 Abnormal posture: Secondary | ICD-10-CM | POA: Diagnosis not present

## 2021-12-06 DIAGNOSIS — R293 Abnormal posture: Secondary | ICD-10-CM | POA: Diagnosis not present

## 2021-12-06 DIAGNOSIS — M6281 Muscle weakness (generalized): Secondary | ICD-10-CM | POA: Diagnosis not present

## 2021-12-06 DIAGNOSIS — R262 Difficulty in walking, not elsewhere classified: Secondary | ICD-10-CM | POA: Diagnosis not present

## 2021-12-09 DIAGNOSIS — M6281 Muscle weakness (generalized): Secondary | ICD-10-CM | POA: Diagnosis not present

## 2021-12-09 DIAGNOSIS — Z20822 Contact with and (suspected) exposure to covid-19: Secondary | ICD-10-CM | POA: Diagnosis not present

## 2021-12-09 DIAGNOSIS — R262 Difficulty in walking, not elsewhere classified: Secondary | ICD-10-CM | POA: Diagnosis not present

## 2021-12-09 DIAGNOSIS — R293 Abnormal posture: Secondary | ICD-10-CM | POA: Diagnosis not present

## 2021-12-11 DIAGNOSIS — M9903 Segmental and somatic dysfunction of lumbar region: Secondary | ICD-10-CM | POA: Diagnosis not present

## 2021-12-11 DIAGNOSIS — R262 Difficulty in walking, not elsewhere classified: Secondary | ICD-10-CM | POA: Diagnosis not present

## 2021-12-11 DIAGNOSIS — R293 Abnormal posture: Secondary | ICD-10-CM | POA: Diagnosis not present

## 2021-12-11 DIAGNOSIS — M6281 Muscle weakness (generalized): Secondary | ICD-10-CM | POA: Diagnosis not present

## 2021-12-11 DIAGNOSIS — S233XXA Sprain of ligaments of thoracic spine, initial encounter: Secondary | ICD-10-CM | POA: Diagnosis not present

## 2021-12-11 DIAGNOSIS — M9902 Segmental and somatic dysfunction of thoracic region: Secondary | ICD-10-CM | POA: Diagnosis not present

## 2021-12-11 DIAGNOSIS — M9901 Segmental and somatic dysfunction of cervical region: Secondary | ICD-10-CM | POA: Diagnosis not present

## 2021-12-11 DIAGNOSIS — S138XXA Sprain of joints and ligaments of other parts of neck, initial encounter: Secondary | ICD-10-CM | POA: Diagnosis not present

## 2021-12-11 DIAGNOSIS — S338XXA Sprain of other parts of lumbar spine and pelvis, initial encounter: Secondary | ICD-10-CM | POA: Diagnosis not present

## 2021-12-13 DIAGNOSIS — M6281 Muscle weakness (generalized): Secondary | ICD-10-CM | POA: Diagnosis not present

## 2021-12-13 DIAGNOSIS — R262 Difficulty in walking, not elsewhere classified: Secondary | ICD-10-CM | POA: Diagnosis not present

## 2021-12-13 DIAGNOSIS — R293 Abnormal posture: Secondary | ICD-10-CM | POA: Diagnosis not present

## 2021-12-16 DIAGNOSIS — M6281 Muscle weakness (generalized): Secondary | ICD-10-CM | POA: Diagnosis not present

## 2021-12-16 DIAGNOSIS — Z20822 Contact with and (suspected) exposure to covid-19: Secondary | ICD-10-CM | POA: Diagnosis not present

## 2021-12-16 DIAGNOSIS — R293 Abnormal posture: Secondary | ICD-10-CM | POA: Diagnosis not present

## 2021-12-16 DIAGNOSIS — R262 Difficulty in walking, not elsewhere classified: Secondary | ICD-10-CM | POA: Diagnosis not present

## 2021-12-17 ENCOUNTER — Telehealth: Payer: Self-pay | Admitting: Internal Medicine

## 2021-12-17 NOTE — Telephone Encounter (Signed)
Pt c/o medication issue: ? ?1. Name of Medication: diltiazem (CARDIZEM CD) 180 MG 24 hr capsule ? ?2. How are you currently taking this medication (dosage and times per day)? Take 1 capsule (180 mg total) by mouth daily. ? ?3. Are you having a reaction (difficulty breathing--STAT)? no ? ?4. What is your medication issue? Patient thinks the medication is too strong. He is feeling weak and lack of energy. Please advise ? ?

## 2021-12-17 NOTE — Telephone Encounter (Signed)
Let's have him stop it then. ? ?I called the patient and let him know to stop the diltiazem.  He is in agreement and is aware of follow up in June. ?

## 2021-12-17 NOTE — Telephone Encounter (Signed)
Call was from patient's son, Hector Beltran.  He took Cardizem 180 mg for couple nights and thought it made him feel little dizzy/lightheaded so he thinks the patient has been skipping doses. ? ?I called and spoke w the patient.  He tells me he feels weak, no energy and so when he has an upcoming busy day of grocery shopping and therapy, he skips the Cardizem the night before.  On days when the dose was skipped, he feels much better.  His BP was running 130/77 at therapy.  He has not felt any irregular heart rhythms but says he never has. ? ?Pt aware I will forward to Dr. Lynnette Caffey for review and recommendations and will call him back. ?

## 2021-12-18 DIAGNOSIS — M9902 Segmental and somatic dysfunction of thoracic region: Secondary | ICD-10-CM | POA: Diagnosis not present

## 2021-12-18 DIAGNOSIS — M9903 Segmental and somatic dysfunction of lumbar region: Secondary | ICD-10-CM | POA: Diagnosis not present

## 2021-12-18 DIAGNOSIS — S138XXA Sprain of joints and ligaments of other parts of neck, initial encounter: Secondary | ICD-10-CM | POA: Diagnosis not present

## 2021-12-18 DIAGNOSIS — M9901 Segmental and somatic dysfunction of cervical region: Secondary | ICD-10-CM | POA: Diagnosis not present

## 2021-12-18 DIAGNOSIS — S233XXA Sprain of ligaments of thoracic spine, initial encounter: Secondary | ICD-10-CM | POA: Diagnosis not present

## 2021-12-18 DIAGNOSIS — S338XXA Sprain of other parts of lumbar spine and pelvis, initial encounter: Secondary | ICD-10-CM | POA: Diagnosis not present

## 2021-12-19 ENCOUNTER — Ambulatory Visit (INDEPENDENT_AMBULATORY_CARE_PROVIDER_SITE_OTHER): Payer: Medicare PPO | Admitting: Neurology

## 2021-12-19 ENCOUNTER — Encounter: Payer: Self-pay | Admitting: Neurology

## 2021-12-19 VITALS — BP 145/78 | HR 73 | Ht 75.0 in | Wt 195.0 lb

## 2021-12-19 DIAGNOSIS — R269 Unspecified abnormalities of gait and mobility: Secondary | ICD-10-CM | POA: Diagnosis not present

## 2021-12-19 DIAGNOSIS — G609 Hereditary and idiopathic neuropathy, unspecified: Secondary | ICD-10-CM
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
## 2021-12-19 NOTE — Patient Instructions (Signed)
Please be careful not to fall  ?Agree with refraining to drive  ?See you back in 1 year  ?

## 2021-12-20 DIAGNOSIS — M6281 Muscle weakness (generalized): Secondary | ICD-10-CM | POA: Diagnosis not present

## 2021-12-20 DIAGNOSIS — R262 Difficulty in walking, not elsewhere classified: Secondary | ICD-10-CM | POA: Diagnosis not present

## 2021-12-20 DIAGNOSIS — R293 Abnormal posture: Secondary | ICD-10-CM | POA: Diagnosis not present

## 2021-12-23 DIAGNOSIS — Z20822 Contact with and (suspected) exposure to covid-19: Secondary | ICD-10-CM | POA: Diagnosis not present

## 2021-12-23 DIAGNOSIS — R293 Abnormal posture: Secondary | ICD-10-CM | POA: Diagnosis not present

## 2021-12-23 DIAGNOSIS — R262 Difficulty in walking, not elsewhere classified: Secondary | ICD-10-CM | POA: Diagnosis not present

## 2021-12-23 DIAGNOSIS — M6281 Muscle weakness (generalized): Secondary | ICD-10-CM | POA: Diagnosis not present

## 2021-12-24 ENCOUNTER — Encounter (HOSPITAL_COMMUNITY): Payer: Self-pay

## 2021-12-24 ENCOUNTER — Inpatient Hospital Stay (HOSPITAL_COMMUNITY): Payer: Medicare PPO | Admitting: Anesthesiology

## 2021-12-24 ENCOUNTER — Inpatient Hospital Stay (HOSPITAL_COMMUNITY): Payer: Medicare PPO | Admitting: Certified Registered Nurse Anesthetist

## 2021-12-24 ENCOUNTER — Emergency Department (HOSPITAL_COMMUNITY): Payer: Medicare PPO

## 2021-12-24 ENCOUNTER — Other Ambulatory Visit: Payer: Self-pay

## 2021-12-24 ENCOUNTER — Encounter (HOSPITAL_COMMUNITY)
Admission: EM | Disposition: A | Discharge status: Skilled Nursing Facility | Payer: Self-pay | Source: Skilled Nursing Facility | Attending: Internal Medicine

## 2021-12-24 ENCOUNTER — Inpatient Hospital Stay (HOSPITAL_COMMUNITY)
Admission: EM | Admit: 2021-12-24 | Discharge: 2021-12-31 | DRG: 521 | Disposition: A | Discharge status: Skilled Nursing Facility | Payer: Medicare PPO | Source: Skilled Nursing Facility | Attending: Internal Medicine | Admitting: Internal Medicine

## 2021-12-24 ENCOUNTER — Inpatient Hospital Stay (HOSPITAL_COMMUNITY): Payer: Medicare PPO

## 2021-12-24 DIAGNOSIS — F319 Bipolar disorder, unspecified: Secondary | ICD-10-CM | POA: Diagnosis not present

## 2021-12-24 DIAGNOSIS — J9601 Acute respiratory failure with hypoxia: Secondary | ICD-10-CM | POA: Diagnosis present

## 2021-12-24 DIAGNOSIS — J449 Chronic obstructive pulmonary disease, unspecified: Secondary | ICD-10-CM

## 2021-12-24 DIAGNOSIS — S72002A Fracture of unspecified part of neck of left femur, initial encounter for closed fracture: Principal | ICD-10-CM | POA: Diagnosis present

## 2021-12-24 DIAGNOSIS — Z01818 Encounter for other preprocedural examination: Secondary | ICD-10-CM | POA: Diagnosis not present

## 2021-12-24 DIAGNOSIS — Z20822 Contact with and (suspected) exposure to covid-19: Secondary | ICD-10-CM | POA: Diagnosis not present

## 2021-12-24 DIAGNOSIS — W010XXA Fall on same level from slipping, tripping and stumbling without subsequent striking against object, initial encounter: Secondary | ICD-10-CM | POA: Diagnosis present

## 2021-12-24 DIAGNOSIS — I5032 Chronic diastolic (congestive) heart failure: Secondary | ICD-10-CM

## 2021-12-24 DIAGNOSIS — I272 Pulmonary hypertension, unspecified: Secondary | ICD-10-CM | POA: Diagnosis present

## 2021-12-24 DIAGNOSIS — I509 Heart failure, unspecified: Secondary | ICD-10-CM

## 2021-12-24 DIAGNOSIS — I471 Supraventricular tachycardia: Secondary | ICD-10-CM | POA: Diagnosis not present

## 2021-12-24 DIAGNOSIS — R7989 Other specified abnormal findings of blood chemistry: Secondary | ICD-10-CM | POA: Diagnosis not present

## 2021-12-24 DIAGNOSIS — S72012A Unspecified intracapsular fracture of left femur, initial encounter for closed fracture: Secondary | ICD-10-CM | POA: Diagnosis not present

## 2021-12-24 DIAGNOSIS — Z8616 Personal history of COVID-19: Secondary | ICD-10-CM

## 2021-12-24 DIAGNOSIS — S72002D Fracture of unspecified part of neck of left femur, subsequent encounter for closed fracture with routine healing: Secondary | ICD-10-CM | POA: Diagnosis not present

## 2021-12-24 DIAGNOSIS — I959 Hypotension, unspecified: Secondary | ICD-10-CM | POA: Diagnosis not present

## 2021-12-24 DIAGNOSIS — Y92009 Unspecified place in unspecified non-institutional (private) residence as the place of occurrence of the external cause: Secondary | ICD-10-CM | POA: Diagnosis not present

## 2021-12-24 DIAGNOSIS — Z66 Do not resuscitate: Secondary | ICD-10-CM | POA: Diagnosis present

## 2021-12-24 DIAGNOSIS — Z79899 Other long term (current) drug therapy: Secondary | ICD-10-CM | POA: Diagnosis not present

## 2021-12-24 DIAGNOSIS — Z471 Aftercare following joint replacement surgery: Secondary | ICD-10-CM | POA: Diagnosis not present

## 2021-12-24 DIAGNOSIS — D72829 Elevated white blood cell count, unspecified: Secondary | ICD-10-CM

## 2021-12-24 DIAGNOSIS — Z4789 Encounter for other orthopedic aftercare: Secondary | ICD-10-CM | POA: Diagnosis not present

## 2021-12-24 DIAGNOSIS — Z043 Encounter for examination and observation following other accident: Secondary | ICD-10-CM | POA: Diagnosis not present

## 2021-12-24 DIAGNOSIS — G629 Polyneuropathy, unspecified: Secondary | ICD-10-CM | POA: Diagnosis present

## 2021-12-24 DIAGNOSIS — S90415A Abrasion, left lesser toe(s), initial encounter: Secondary | ICD-10-CM

## 2021-12-24 DIAGNOSIS — W19XXXA Unspecified fall, initial encounter: Secondary | ICD-10-CM | POA: Diagnosis not present

## 2021-12-24 DIAGNOSIS — M79605 Pain in left leg: Secondary | ICD-10-CM | POA: Diagnosis not present

## 2021-12-24 DIAGNOSIS — Z7401 Bed confinement status: Secondary | ICD-10-CM | POA: Diagnosis not present

## 2021-12-24 DIAGNOSIS — R0902 Hypoxemia: Secondary | ICD-10-CM | POA: Diagnosis not present

## 2021-12-24 DIAGNOSIS — I517 Cardiomegaly: Secondary | ICD-10-CM | POA: Diagnosis not present

## 2021-12-24 DIAGNOSIS — F17210 Nicotine dependence, cigarettes, uncomplicated: Secondary | ICD-10-CM | POA: Diagnosis present

## 2021-12-24 DIAGNOSIS — R5383 Other fatigue: Secondary | ICD-10-CM | POA: Diagnosis not present

## 2021-12-24 DIAGNOSIS — S90414A Abrasion, right lesser toe(s), initial encounter: Secondary | ICD-10-CM

## 2021-12-24 DIAGNOSIS — R2242 Localized swelling, mass and lump, left lower limb: Secondary | ICD-10-CM | POA: Diagnosis not present

## 2021-12-24 DIAGNOSIS — Z96642 Presence of left artificial hip joint: Secondary | ICD-10-CM | POA: Diagnosis not present

## 2021-12-24 DIAGNOSIS — R0689 Other abnormalities of breathing: Secondary | ICD-10-CM | POA: Diagnosis not present

## 2021-12-24 DIAGNOSIS — R531 Weakness: Secondary | ICD-10-CM | POA: Diagnosis not present

## 2021-12-24 DIAGNOSIS — C3411 Malignant neoplasm of upper lobe, right bronchus or lung: Secondary | ICD-10-CM | POA: Diagnosis not present

## 2021-12-24 DIAGNOSIS — C4331 Malignant melanoma of nose: Secondary | ICD-10-CM | POA: Diagnosis not present

## 2021-12-24 HISTORY — PX: HIP ARTHROPLASTY: SHX981

## 2021-12-24 LAB — CBC WITH DIFFERENTIAL/PLATELET
Abs Immature Granulocytes: 0.12 10*3/uL — ABNORMAL HIGH (ref 0.00–0.07)
Basophils Absolute: 0 10*3/uL (ref 0.0–0.1)
Basophils Relative: 0 %
Eosinophils Absolute: 0.1 10*3/uL (ref 0.0–0.5)
Eosinophils Relative: 1 %
HCT: 47.7 % (ref 39.0–52.0)
Hemoglobin: 15.6 g/dL (ref 13.0–17.0)
Immature Granulocytes: 1 %
Lymphocytes Relative: 6 %
Lymphs Abs: 0.8 10*3/uL (ref 0.7–4.0)
MCH: 31.8 pg (ref 26.0–34.0)
MCHC: 32.7 g/dL (ref 30.0–36.0)
MCV: 97.3 fL (ref 80.0–100.0)
Monocytes Absolute: 0.5 10*3/uL (ref 0.1–1.0)
Monocytes Relative: 4 %
Neutro Abs: 12.8 10*3/uL — ABNORMAL HIGH (ref 1.7–7.7)
Neutrophils Relative %: 88 %
Platelets: 186 10*3/uL (ref 150–400)
RBC: 4.9 MIL/uL (ref 4.22–5.81)
RDW: 13 % (ref 11.5–15.5)
WBC: 14.4 10*3/uL — ABNORMAL HIGH (ref 4.0–10.5)
nRBC: 0 % (ref 0.0–0.2)

## 2021-12-24 LAB — RESP PANEL BY RT-PCR (FLU A&B, COVID) ARPGX2
Influenza A by PCR: NEGATIVE
Influenza B by PCR: NEGATIVE
SARS Coronavirus 2 by RT PCR: NEGATIVE

## 2021-12-24 LAB — BASIC METABOLIC PANEL
Anion gap: 6 (ref 5–15)
BUN: 20 mg/dL (ref 8–23)
CO2: 30 mmol/L (ref 22–32)
Calcium: 8.9 mg/dL (ref 8.9–10.3)
Chloride: 100 mmol/L (ref 98–111)
Creatinine, Ser: 0.79 mg/dL (ref 0.61–1.24)
GFR, Estimated: >60 mL/min (ref >60)
Glucose, Bld: 110 mg/dL — ABNORMAL HIGH (ref 70–99)
Potassium: 4 mmol/L (ref 3.5–5.1)
Sodium: 136 mmol/L (ref 135–145)

## 2021-12-24 LAB — PROTIME-INR
INR: 1 (ref 0.8–1.2)
Prothrombin Time: 13.3 seconds (ref 11.4–15.2)

## 2021-12-24 LAB — ABO/RH: ABO/RH(D): O POS

## 2021-12-24 LAB — TYPE AND SCREEN
ABO/RH(D): O POS
Antibody Screen: NEGATIVE

## 2021-12-24 SURGERY — HEMIARTHROPLASTY, HIP, DIRECT ANTERIOR APPROACH, FOR FRACTURE
Anesthesia: Spinal | Site: Hip | Laterality: Left

## 2021-12-24 MED ORDER — MORPHINE SULFATE (PF) 2 MG/ML IV SOLN: 0.5000 mg | INTRAVENOUS | Status: DC | PRN

## 2021-12-24 MED ORDER — BUPIVACAINE IN DEXTROSE 0.75-8.25 % IT SOLN
INTRATHECAL | Status: DC | PRN
  Administered 2021-12-24: 1.8 mL via INTRATHECAL

## 2021-12-24 MED ORDER — DEXAMETHASONE SODIUM PHOSPHATE 4 MG/ML IJ SOLN
INTRAMUSCULAR | Status: DC | PRN
  Administered 2021-12-24: 5 mg

## 2021-12-24 MED ORDER — LACTATED RINGERS IV SOLN: INTRAVENOUS | Status: DC

## 2021-12-24 MED ORDER — METOCLOPRAMIDE HCL 5 MG/ML IJ SOLN: 5.0000 mg | Freq: Three times a day (TID) | INTRAMUSCULAR | Status: DC | PRN

## 2021-12-24 MED ORDER — BUPIVACAINE HCL (PF) 0.5 % IJ SOLN
INTRAMUSCULAR | Status: AC
  Filled 2021-12-24: qty 30

## 2021-12-24 MED ORDER — TRANEXAMIC ACID-NACL 1000-0.7 MG/100ML-% IV SOLN
1000.0000 mg | INTRAVENOUS | Status: AC
  Administered 2021-12-24: 1000 mg via INTRAVENOUS
  Filled 2021-12-24: qty 100

## 2021-12-24 MED ORDER — DEXAMETHASONE SODIUM PHOSPHATE 10 MG/ML IJ SOLN
INTRAMUSCULAR | Status: AC
  Filled 2021-12-24: qty 1

## 2021-12-24 MED ORDER — MENTHOL 3 MG MT LOZG: 1.0000 | LOZENGE | OROMUCOSAL | Status: DC | PRN

## 2021-12-24 MED ORDER — TRANEXAMIC ACID-NACL 1000-0.7 MG/100ML-% IV SOLN
INTRAVENOUS | Status: AC
  Filled 2021-12-24: qty 100

## 2021-12-24 MED ORDER — PHENOL 1.4 % MT LIQD: 1.0000 | OROMUCOSAL | Status: DC | PRN

## 2021-12-24 MED ORDER — FENTANYL CITRATE PF 50 MCG/ML IJ SOSY
PREFILLED_SYRINGE | INTRAMUSCULAR | Status: AC
  Administered 2021-12-24: 50 ug via INTRAVENOUS
  Filled 2021-12-24: qty 2

## 2021-12-24 MED ORDER — PROPOFOL 500 MG/50ML IV EMUL
INTRAVENOUS | Status: DC | PRN
  Administered 2021-12-24: 75 ug/kg/min via INTRAVENOUS

## 2021-12-24 MED ORDER — POVIDONE-IODINE 10 % EX SWAB
2.0000 "application " | Freq: Once | CUTANEOUS | Status: AC
  Administered 2021-12-24: 2 via TOPICAL

## 2021-12-24 MED ORDER — METOCLOPRAMIDE HCL 5 MG PO TABS: 5.0000 mg | ORAL_TABLET | Freq: Three times a day (TID) | ORAL | Status: DC | PRN

## 2021-12-24 MED ORDER — FENTANYL CITRATE PF 50 MCG/ML IJ SOSY: 50.0000 ug | PREFILLED_SYRINGE | INTRAMUSCULAR | Status: DC

## 2021-12-24 MED ORDER — NALOXONE HCL 0.4 MG/ML IJ SOLN
0.4000 mg | INTRAMUSCULAR | Status: DC | PRN
Start: 2021-12-24 — End: 2022-01-01

## 2021-12-24 MED ORDER — HYDROCODONE-ACETAMINOPHEN 5-325 MG PO TABS
1.0000 | ORAL_TABLET | ORAL | Status: DC | PRN
  Administered 2021-12-25: 1 via ORAL
  Filled 2021-12-24: qty 1

## 2021-12-24 MED ORDER — HYDROCODONE-ACETAMINOPHEN 7.5-325 MG PO TABS: 1.0000 | ORAL_TABLET | ORAL | Status: DC | PRN

## 2021-12-24 MED ORDER — BISACODYL 10 MG RE SUPP: 10.0000 mg | Freq: Every day | RECTAL | Status: DC | PRN

## 2021-12-24 MED ORDER — ONDANSETRON HCL 4 MG/2ML IJ SOLN
INTRAMUSCULAR | Status: AC
  Filled 2021-12-24: qty 2

## 2021-12-24 MED ORDER — ONDANSETRON HCL 4 MG/2ML IJ SOLN: 4.0000 mg | Freq: Four times a day (QID) | INTRAMUSCULAR | Status: DC | PRN

## 2021-12-24 MED ORDER — ACETAMINOPHEN 500 MG PO TABS
500.0000 mg | ORAL_TABLET | Freq: Four times a day (QID) | ORAL | Status: AC
  Administered 2021-12-25 (×4): 500 mg via ORAL
  Filled 2021-12-24 (×4): qty 1

## 2021-12-24 MED ORDER — BUPIVACAINE HCL (PF) 0.5 % IJ SOLN
INTRAMUSCULAR | Status: DC | PRN
Start: 2021-12-24 — End: 2021-12-24
  Administered 2021-12-24: 15 mL

## 2021-12-24 MED ORDER — CHLORHEXIDINE GLUCONATE 4 % EX LIQD: 60.0000 mL | Freq: Once | CUTANEOUS | Status: DC

## 2021-12-24 MED ORDER — DEXAMETHASONE SODIUM PHOSPHATE 10 MG/ML IJ SOLN
INTRAMUSCULAR | Status: DC | PRN
  Administered 2021-12-24: 4 mg via INTRAVENOUS

## 2021-12-24 MED ORDER — PROPOFOL 10 MG/ML IV BOLUS
INTRAVENOUS | Status: DC | PRN
  Administered 2021-12-24 (×2): 15 mg via INTRAVENOUS

## 2021-12-24 MED ORDER — PHENYLEPHRINE HCL-NACL 20-0.9 MG/250ML-% IV SOLN
INTRAVENOUS | Status: DC | PRN
  Administered 2021-12-24: 50 ug/min via INTRAVENOUS

## 2021-12-24 MED ORDER — FENTANYL CITRATE PF 50 MCG/ML IJ SOSY
25.0000 ug | PREFILLED_SYRINGE | INTRAMUSCULAR | Status: DC | PRN
  Administered 2021-12-24: 25 ug via INTRAVENOUS
  Filled 2021-12-24: qty 1

## 2021-12-24 MED ORDER — SODIUM CHLORIDE 0.45 % IV SOLN: INTRAVENOUS | Status: DC

## 2021-12-24 MED ORDER — PHENYLEPHRINE HCL-NACL 20-0.9 MG/250ML-% IV SOLN
INTRAVENOUS | Status: AC
  Filled 2021-12-24: qty 250

## 2021-12-24 MED ORDER — ACETAMINOPHEN 325 MG PO TABS: 325.0000 mg | ORAL_TABLET | Freq: Four times a day (QID) | ORAL | Status: DC | PRN

## 2021-12-24 MED ORDER — ACETAMINOPHEN 650 MG RE SUPP: 650.0000 mg | Freq: Four times a day (QID) | RECTAL | Status: DC | PRN

## 2021-12-24 MED ORDER — CEFAZOLIN SODIUM-DEXTROSE 2-4 GM/100ML-% IV SOLN
2.0000 g | INTRAVENOUS | Status: AC
  Administered 2021-12-24: 2 g via INTRAVENOUS
  Filled 2021-12-24: qty 100

## 2021-12-24 MED ORDER — EPHEDRINE 5 MG/ML INJ
INTRAVENOUS | Status: AC
  Filled 2021-12-24: qty 5

## 2021-12-24 MED ORDER — ONDANSETRON HCL 4 MG PO TABS: 4.0000 mg | ORAL_TABLET | Freq: Four times a day (QID) | ORAL | Status: DC | PRN

## 2021-12-24 MED ORDER — ACETAMINOPHEN 325 MG PO TABS: 650.0000 mg | ORAL_TABLET | Freq: Four times a day (QID) | ORAL | Status: DC | PRN

## 2021-12-24 MED ORDER — ONDANSETRON HCL 4 MG/2ML IJ SOLN
4.0000 mg | Freq: Once | INTRAMUSCULAR | Status: AC
  Administered 2021-12-24: 4 mg via INTRAVENOUS
  Filled 2021-12-24: qty 2

## 2021-12-24 MED ORDER — TRANEXAMIC ACID-NACL 1000-0.7 MG/100ML-% IV SOLN
1000.0000 mg | Freq: Once | INTRAVENOUS | Status: AC
  Administered 2021-12-24: 1000 mg via INTRAVENOUS

## 2021-12-24 MED ORDER — PROPOFOL 1000 MG/100ML IV EMUL
INTRAVENOUS | Status: AC
  Filled 2021-12-24: qty 100

## 2021-12-24 MED ORDER — ASPIRIN EC 325 MG PO TBEC
325.0000 mg | DELAYED_RELEASE_TABLET | Freq: Every day | ORAL | Status: DC
  Administered 2021-12-25 – 2021-12-31 (×7): 325 mg via ORAL
  Filled 2021-12-24 (×8): qty 1

## 2021-12-24 MED ORDER — ROPIVACAINE HCL 5 MG/ML IJ SOLN
INTRAMUSCULAR | Status: DC | PRN
Start: 2021-12-24 — End: 2021-12-24
  Administered 2021-12-24: 30 mL via PERINEURAL

## 2021-12-24 MED ORDER — PHENYLEPHRINE 40 MCG/ML (10ML) SYRINGE FOR IV PUSH (FOR BLOOD PRESSURE SUPPORT)
PREFILLED_SYRINGE | INTRAVENOUS | Status: AC
  Filled 2021-12-24: qty 10

## 2021-12-24 MED ORDER — DOCUSATE SODIUM 100 MG PO CAPS
100.0000 mg | ORAL_CAPSULE | Freq: Two times a day (BID) | ORAL | Status: DC
  Administered 2021-12-25 – 2021-12-31 (×14): 100 mg via ORAL
  Filled 2021-12-24 (×15): qty 1

## 2021-12-24 MED ORDER — ONDANSETRON HCL 4 MG/2ML IJ SOLN
INTRAMUSCULAR | Status: DC | PRN
  Administered 2021-12-24: 4 mg via INTRAVENOUS

## 2021-12-24 SURGICAL SUPPLY — 60 items
BAG COUNTER SPONGE SURGICOUNT (BAG) IMPLANT
BALL HIP ARTICU 28 +5 (Hips) IMPLANT
BIPOLAR PROS AML 57 (Hips) ×2 IMPLANT
BLADE SAW SAG 73X25 THK (BLADE) ×1
BLADE SAW SGTL 73X25 THK (BLADE) ×1 IMPLANT
BRUSH FEMORAL CANAL (MISCELLANEOUS) IMPLANT
CHLORAPREP W/TINT 26 (MISCELLANEOUS) ×1 IMPLANT
COVER SURGICAL LIGHT HANDLE (MISCELLANEOUS) ×2 IMPLANT
DRAPE INCISE IOBAN 66X45 STRL (DRAPES) ×2 IMPLANT
DRAPE ORTHO SPLIT 77X108 STRL (DRAPES) ×4
DRAPE POUCH INSTRU U-SHP 10X18 (DRAPES) ×2 IMPLANT
DRAPE SURG ORHT 6 SPLT 77X108 (DRAPES) ×2 IMPLANT
DRAPE U-SHAPE 47X51 STRL (DRAPES) ×2 IMPLANT
DRAPE WARM FLUID 44X44 (DRAPES) ×1 IMPLANT
DRSG MEPILEX BORDER 4X8 (GAUZE/BANDAGES/DRESSINGS) ×1 IMPLANT
DRSG PAD ABDOMINAL 8X10 ST (GAUZE/BANDAGES/DRESSINGS) ×1 IMPLANT
ELECT BLADE TIP CTD 4 INCH (ELECTRODE) ×2 IMPLANT
ELECT REM PT RETURN 15FT ADLT (MISCELLANEOUS) ×2 IMPLANT
EVACUATOR 1/8 PVC DRAIN (DRAIN) ×1 IMPLANT
FACESHIELD WRAPAROUND (MASK) ×10 IMPLANT
FACESHIELD WRAPAROUND OR TEAM (MASK) ×5 IMPLANT
GAUZE SPONGE 4X4 12PLY STRL (GAUZE/BANDAGES/DRESSINGS) ×3 IMPLANT
GAUZE XEROFORM 5X9 LF (GAUZE/BANDAGES/DRESSINGS) ×2 IMPLANT
GLOVE BIOGEL PI IND STRL 8 (GLOVE) ×1 IMPLANT
GLOVE BIOGEL PI INDICATOR 8 (GLOVE) ×1
GLOVE ORTHO TXT STRL SZ7.5 (GLOVE) ×2 IMPLANT
HANDPIECE INTERPULSE COAX TIP (DISPOSABLE) ×2
HEAD BIPOLAR PROS AML 57 (Hips) IMPLANT
HIP BALL ARTICU 28 +5 (Hips) ×2 IMPLANT
IMMOBILIZER KNEE 20 (SOFTGOODS) ×4 IMPLANT
IMMOBILIZER KNEE 20 THIGH 36 (SOFTGOODS) ×1 IMPLANT
KIT BASIN OR (CUSTOM PROCEDURE TRAY) ×2 IMPLANT
KIT TURNOVER KIT A (KITS) IMPLANT
NDL MAYO CATGUT SZ4 TPR NDL (NEEDLE) ×1 IMPLANT
NEEDLE HYPO 22GX1.5 SAFETY (NEEDLE) IMPLANT
NEEDLE MAYO CATGUT SZ4 (NEEDLE) ×2 IMPLANT
NS IRRIG 1000ML POUR BTL (IV SOLUTION) ×4 IMPLANT
PACK TOTAL JOINT (CUSTOM PROCEDURE TRAY) ×2 IMPLANT
PASSER SUT SWANSON 36MM LOOP (INSTRUMENTS) ×2 IMPLANT
PROTECTOR NERVE ULNAR (MISCELLANEOUS) ×2 IMPLANT
SET HNDPC FAN SPRY TIP SCT (DISPOSABLE) IMPLANT
SPONGE T-LAP 18X18 ~~LOC~~+RFID (SPONGE) IMPLANT
STAPLER VISISTAT 35W (STAPLE) ×2 IMPLANT
STEM SUMMIT PRESSFIT SZ 6 (Hips) ×1 IMPLANT
SUCTION FRAZIER HANDLE 12FR (TUBING) ×2
SUCTION TUBE FRAZIER 12FR DISP (TUBING) ×1 IMPLANT
SUT ETHIBOND NAB CT1 #1 30IN (SUTURE) ×8 IMPLANT
SUT VIC AB 0 CT1 27 (SUTURE) ×2
SUT VIC AB 0 CT1 27XBRD ANTBC (SUTURE) ×1 IMPLANT
SUT VIC AB 1 CT1 27 (SUTURE)
SUT VIC AB 1 CT1 27XBRD ANTBC (SUTURE) IMPLANT
SUT VIC AB 1 CTX 36 (SUTURE)
SUT VIC AB 1 CTX36XBRD ANBCTR (SUTURE) IMPLANT
SUT VIC AB 2-0 CT1 36 (SUTURE) ×6 IMPLANT
SYR 30ML LL (SYRINGE) IMPLANT
TAPE CLOTH SURG 6X10 WHT LF (GAUZE/BANDAGES/DRESSINGS) ×1 IMPLANT
TOWEL OR 17X26 10 PK STRL BLUE (TOWEL DISPOSABLE) ×4 IMPLANT
TOWER CARTRIDGE SMART MIX (DISPOSABLE) IMPLANT
TRAY FOLEY MTR SLVR 16FR STAT (SET/KITS/TRAYS/PACK) ×2 IMPLANT
WATER STERILE IRR 1000ML POUR (IV SOLUTION) ×2 IMPLANT

## 2021-12-24 NOTE — ED Notes (Signed)
Pt denies pain despite elevated HR, desat to 82% spO2, writer increased O2 to 5 liters per minute. Educated pt to breath in deep and blow out with return demonstration.  ?

## 2021-12-24 NOTE — Op Note (Signed)
Preop diagnosis: Left displaced femoral neck fracture, closed ? ?Postop diagnosis: Same ? ?Procedure: Left hip hemiarthroplasty for femoral neck fracture. ? ?Surgeon: Rodell Perna, MD ? ?Anesthesia: Spinal +15 cc Marcaine skin local. ? ?EBL 200 cc ? ?Implants:Implants ? ?BIPOLAR PROS AML 57MM - [id number] ? ?Inventory Item: BIPOLAR PROS AML 57MM Serial no.:  Model/Cat no.: [id number]  ?Implant nameEulis Foster AML 57MM - [id number] Laterality: Left Area: Hip  ?Manufacturer: Dewitt Hoes Date of Manufacture:    ?Action: Implanted Number Used: 1   ?Device Identifier:  Device Identifier Type:    ? ?STEM SUMMIT PRESSFIT SZ 6 - [id number] ? ?Inventory Item: STEM SUMMIT PRESSFIT SZ 6 Serial no.:  Model/Cat no.: [id number]  ?Implant name: STEM SUMMIT PRESSFIT SZ 6 - [id number] Laterality: Left Area: Hip  ?Manufacturer: Dewitt Hoes Date of Manufacture:    ?Action: Implanted Number Used: 1   ?Device Identifier:  Device Identifier Type:    ? ?HIP BALL ARTICU 28 +5 - [id number] ? ?Inventory Item: HIP BALL ARTICU 28 +5 Serial no.:  Model/Cat no.: [id number]  ?Implant name: HIP BALL ARTICU 28 +5 - [id number] Laterality: Left Area: Hip  ?Manufacturer: Dewitt Hoes Date of Manufacture:    ?Action: Implanted Number Used: 1   ?Device Identifier:  Device Identifier Type:    ? ? ?Procedure: Patient had preoperative hip block ordered for holding area.  Spinal anesthesia was used and patient was placed in lateral position after induction of spinal anesthesia with the marked to frame careful padding and positioning.  Axillary roll was used.  Hip was prepped with DuraPrep the usual total hip sheets drapes impervious stockinette sterile skin marker Coban x2 was applied timeout procedure was completed 2 g Ancef prophylaxis and 1 g IV TXA. ? ?Posterior approach was made gluteus maximus was split in line with the fibers piriformis tagged posterior capsule was opened femoral neck was cut 1 fingerbreadth above the lesser trochanter  and ball was removed with a corkscrew.  There is some fragments in the joint that had to be removed.  Capsule sutures were placed for retraction.  Socket was checked all loose pieces were out of the socket.  Neck cut was about 2 mm shorter than normal measured at 9 mm and patient was 65 we progressed up to a size 6 broach for press-fit Summit basic size 6 stem which was inserted after trial showed the +1.5 ball was slightly unstable and a +5 ball gave good stability, restoration of leg lengths and good stability flexion and 90 degrees internal rotation 80 degrees.  Permanent stem was inserted head ball was assembled with the bipolar and a +5 neck length.  Once it was assembled it was impacted sciatic nerve again was protected with a finger and hip was reduced by me with good stability and identical stability findings.  Copious irrigation.  Capsule sutures repaired with #1 Ethibond.  Gluteus maximus was repaired after piriformis repaired the gluteus medius tendon with a free needle.  #1 Ethibond and the tensor fascia gluteus maximus fascia 2-0 Vicryl subtenons tissue skin staple closure Marcaine infiltration Mepilex 4 x 4's and ABD with tape.  Knee immobilizer is applied leg lengths were identical and patient was transferred to cover room in stable condition. ?

## 2021-12-24 NOTE — ED Notes (Signed)
Called to add on a blue top to the lab. And patient has taken off his condom catheter x2. ?

## 2021-12-24 NOTE — ED Triage Notes (Signed)
Patient BIB [facility] from [facility]. Went to use the bathroom, lost balance and eased himself to the ground. Did not hit head, remembers the fall well. Having major pain in left upper thigh.  ?

## 2021-12-24 NOTE — H&P (View-Only) (Signed)
? ? ?Reason for Consult: Left displaced femoral neck fracture ?Referring Physician: Cherylann Ratel, DO ? ?Hector Beltran is an 88 y.o. male.  ?HPI: 88 year old male has a walker at home ambulates and states he got up to the bathroom at night lost his balance, fell and injured his left hip.  Had immediate pain, he was unable to walk.  Had severe pain with motion of his left hip.  Patient crawled to the phone he was brought to the ER by EMS. ? ?Past Medical History:  ?Diagnosis Date  ? Constipation   ? COVID 05/27/2021  ? Gait abnormality 02/09/2020  ? Neuropathy   ? Peripheral neuropathy 09/30/2017  ? SVT (supraventricular tachycardia) ([place])   ? ? ?Past Surgical History:  ?Procedure Laterality Date  ? CYST REMOVAL NECK    ? ? ?History reviewed. No pertinent family history. ? ?Social History:  reports that he quit smoking about 16 months ago. His smoking use included cigarettes. He has never used smokeless tobacco. He reports current alcohol use of about 3.0 standard drinks per week. He reports that he does not use drugs. ? ?Allergies: No Known Allergies ? ?Medications: I have reviewed the patient's current medications. ? ?Results for orders placed or performed during the hospital encounter of 12/24/21 (from the past 48 hour(s))  ?CBC with Differential/Platelet     Status: Abnormal  ? Collection Time: 12/24/21  3:50 AM  ?Result Value Ref Range  ? WBC 14.4 (H) 4.0 - 10.5 K/uL  ? RBC 4.90 4.22 - 5.81 MIL/uL  ? Hemoglobin 15.6 13.0 - 17.0 g/dL  ? HCT 47.7 39.0 - 52.0 %  ? MCV 97.3 80.0 - 100.0 fL  ? MCH 31.8 26.0 - 34.0 pg  ? MCHC 32.7 30.0 - 36.0 g/dL  ? RDW 13.0 11.5 - 15.5 %  ? Platelets 186 150 - 400 K/uL  ? nRBC 0.0 0.0 - 0.2 %  ? Neutrophils Relative % 88 %  ? Neutro Abs 12.8 (H) 1.7 - 7.7 K/uL  ? Lymphocytes Relative 6 %  ? Lymphs Abs 0.8 0.7 - 4.0 K/uL  ? Monocytes Relative 4 %  ? Monocytes Absolute 0.5 0.1 - 1.0 K/uL  ? Eosinophils Relative 1 %  ? Eosinophils Absolute 0.1 0.0 - 0.5 K/uL  ? Basophils Relative 0 %  ?  Basophils Absolute 0.0 0.0 - 0.1 K/uL  ? Immature Granulocytes 1 %  ? Abs Immature Granulocytes 0.12 (H) 0.00 - 0.07 K/uL  ?  Comment: Performed at [facility], [place] [address], [place], [place] [address]  ?Basic metabolic panel     Status: Abnormal  ? Collection Time: 12/24/21  3:50 AM  ?Result Value Ref Range  ? Sodium 136 135 - 145 mmol/L  ? Potassium 4.0 3.5 - 5.1 mmol/L  ? Chloride 100 98 - 111 mmol/L  ? CO2 30 22 - 32 mmol/L  ? Glucose, Bld 110 (H) 70 - 99 mg/dL  ?  Comment: Glucose reference range applies only to samples taken after fasting for at least 8 hours.  ? BUN 20 8 - 23 mg/dL  ? Creatinine, Ser 0.79 0.61 - 1.24 mg/dL  ? Calcium 8.9 8.9 - 10.3 mg/dL  ? GFR, Estimated >60 >60 mL/min  ?  Comment: (NOTE) ?Calculated using the CKD-EPI Creatinine Equation (2021) ?  ? Anion gap 6 5 - 15  ?  Comment: Performed at [facility], [place] [address] [place], [place] [address]  ?Type and screen [facility]     Status: None  ? Collection Time: 12/24/21  3:50 AM  ?Result Value Ref Range  ? ABO/RH(D) O POS   ? Antibody Screen NEG   ? Sample Expiration    ?  12/27/2021,2359 ?Performed at [facility], [place] [address] [place], [place] [address] ?  ?Protime-INR     Status: None  ? Collection Time: 12/24/21  3:50 AM  ?Result Value Ref Range  ? Prothrombin Time 13.3 11.4 - 15.2 seconds  ? INR 1.0 0.8 - 1.2  ?  Comment: (NOTE) ?INR goal varies based on device and disease states. ?Performed at [facility], [place] [address] ?[place], [place] [address] ?  ?ABO/Rh     Status: None  ? Collection Time: 12/24/21  3:51 AM  ?Result Value Ref Range  ? ABO/RH(D)    ?  O POS ?Performed at [facility], [place] [address], [place], [place] [address] ?  ?Resp Panel by RT-PCR (Flu A&B, Covid) Nasopharyngeal Swab     Status: None  ? Collection Time: 12/24/21  4:00 AM  ? Specimen: Nasopharyngeal Swab; Nasopharyngeal(NP) swabs in vial  transport medium  ?Result Value Ref Range  ? SARS Coronavirus 2 by RT PCR NEGATIVE NEGATIVE  ?  Comment: (NOTE) ?SARS-CoV-2 target nucleic acids are NOT DETECTED. ? ?The SARS-CoV-2 RNA is generally detectable in upper respiratory ?specimens during the acute phase of infection. The lowest ?concentration of SARS-CoV-2 viral copies this assay can detect is ?138 copies/mL. A negative result does not preclude SARS-Cov-2 ?infection and should not be used as the sole basis for treatment or ?other patient management decisions. A negative result may occur with  ?improper specimen collection/handling, submission of specimen other ?than nasopharyngeal swab, presence of viral mutation(s) within the ?areas targeted by this assay, and inadequate number of viral ?copies(<138 copies/mL). A negative result must be combined with ?clinical observations, patient history, and epidemiological ?information. The expected result is Negative. ? ?Fact Sheet for Patients:  ?EntrepreneurPulse.com.au ? ?Fact Sheet for Healthcare Providers:  ?IncredibleEmployment.be ? ?This test is no t yet approved or cleared by the [place] FDA and  ?has been authorized for detection and/or diagnosis of SARS-CoV-2 by ?FDA under an Emergency Use Authorization (EUA). This EUA will remain  ?in effect (meaning this test can be used) for the duration of the ?COVID-19 declaration under Section 564(b)(1) of the Act, 21 ?U.S.C.section 360bbb-3(b)(1), unless the authorization is terminated  ?or revoked sooner.  ? ? ?  ? Influenza A by PCR NEGATIVE NEGATIVE  ? Influenza B by PCR NEGATIVE NEGATIVE  ?  Comment: (NOTE) ?The Xpert Xpress SARS-CoV-2/FLU/RSV plus assay is intended as an aid ?in the diagnosis of influenza from Nasopharyngeal swab specimens and ?should not be used as a sole basis for treatment. Nasal washings and ?aspirates are unacceptable for Xpert Xpress SARS-CoV-2/FLU/RSV ?testing. ? ?Fact Sheet for  Patients: ?EntrepreneurPulse.com.au ? ?Fact Sheet for Healthcare Providers: ?IncredibleEmployment.be ? ?This test is not yet approved or cleared by the [place] FDA and ?has been authorized for detection and/or diagnosis of SARS-CoV-2 by ?FDA under an Emergency Use Authorization (EUA). This EUA will remain ?in effect (meaning this test can be used) for the duration of the ?COVID-19 declaration under Section 564(b)(1) of the Act, 21 U.S.C. ?section 360bbb-3(b)(1), unless the authorization is terminated or ?revoked. ? ?Performed at [facility], [place] [address] ?[place], [place] [address] ?  ? ? ?DG Chest Port 1 View ? ?Result  Date: 12/24/2021 ?CLINICAL DATA:  Preop, hip fracture. EXAM: PORTABLE CHEST 1 VIEW COMPARISON:  09/25/2021. FINDINGS: The heart is enlarged and the mediastinal contour is within normal limits. Atherosclerotic calcification of the aorta is noted. No consolidation, effusion, or pneumothorax. No acute osseous abnormality. IMPRESSION: No active disease. Electronically Signed   By: Brett Fairy M.D.   On: 12/24/2021 04:08  ? ?DG HIP UNILAT WITH PELVIS 2-3 VIEWS LEFT ? ?Result Date: 12/24/2021 ?CLINICAL DATA:  Fall, landed on left side with left hip pain. EXAM: DG HIP (WITH OR WITHOUT PELVIS) 2-3V LEFT; LEFT FEMUR 2 VIEWS COMPARISON:  01/22/2016. FINDINGS: There is a subcapital impaction fracture of the proximal left femur with mild superior subluxation of the distal fracture fragment. No dislocation. Degenerative changes are present at the hips bilaterally, left knee, and in the lower lumbar spine. A trace suprapatellar joint effusion is noted on the right. Vascular calcifications are noted in the soft tissues. IMPRESSION: Subcapital impaction fracture of the proximal left femur. Electronically Signed   By: Brett Fairy M.D.   On: 12/24/2021 03:50  ? ?DG FEMUR MIN 2 VIEWS LEFT ? ?Result Date: 12/24/2021 ?CLINICAL DATA:  Fall, landed on left  side with left hip pain. EXAM: DG HIP (WITH OR WITHOUT PELVIS) 2-3V LEFT; LEFT FEMUR 2 VIEWS COMPARISON:  01/22/2016. FINDINGS: There is a subcapital impaction fracture of the proximal left femur with mild superior subl

## 2021-12-24 NOTE — Progress Notes (Signed)
AssistedDr. Hulan Beltran with left, pericapsular nerve group (PENG) block. Side rails up, monitors on throughout procedure. See vital signs in flow sheet. Tolerated Procedure well. ? ?

## 2021-12-24 NOTE — Anesthesia Procedure Notes (Signed)
Spinal ? ?Patient location during procedure: OR ?End time: 12/24/2021 5:25 PM ?Reason for block: surgical anesthesia ?Staffing ?Performed: resident/CRNA  ?Resident/CRNA: Rosaland Lao, CRNA ?Preanesthetic Checklist ?Completed: patient identified, IV checked, site marked, risks and benefits discussed, surgical consent, monitors and equipment checked, pre-op evaluation and timeout performed ?Spinal Block ?Patient position: sitting ?Prep: DuraPrep ?Patient monitoring: heart rate, cardiac monitor, continuous pulse ox and blood pressure ?Approach: midline ?Location: L3-4 ?Injection technique: single-shot ?Needle ?Needle type: Pencan  ?Needle gauge: 24 G ?Needle length: 10 cm ?Assessment ?Sensory level: T4 ?Events: CSF return ? ? ? ?

## 2021-12-24 NOTE — ED Notes (Signed)
Pt off the floor to PACU for nerve block at this time.  ?

## 2021-12-24 NOTE — H&P (Signed)
?History and Physical  ? ? ?Patient: Hector Beltran OQH:[id number] DOB: Dec 05, 1927 ?DOA: 12/24/2021 ?DOS: the patient was seen and examined on 12/24/2021 ?PCP: Hector Dills, MD  ?Patient coming from: ALF/ILF ? ?Chief Complaint:  ?Chief Complaint  ?Patient presents with  ? Fall  ? ?HPI: Hector Beltran is a 88 y.o. male with medical history significant of chronic diastolic HF, peripheral neuropathy, SVT. Presenting with hip pain after a fall. He reports that he went to the bathroom last night and lost his balance while at the toilet. He fell backwards. He didn't hit his head or have LOC. He couldn't get up afterwards d/t pain. He had to crawl to get to his phone. Prior to his fall he didn't have any CP or palpitations. He called for EMS to bring him to the ED. He denies any other aggravating or alleviating factors.    ? ?Review of Systems: As mentioned in the history of present illness. All other systems reviewed and are negative. ?Past Medical History:  ?Diagnosis Date  ? Constipation   ? COVID 05/27/2021  ? Gait abnormality 02/09/2020  ? Neuropathy   ? Peripheral neuropathy 09/30/2017  ? SVT (supraventricular tachycardia) ([facility])   ? ?Past Surgical History:  ?Procedure Laterality Date  ? CYST REMOVAL NECK    ? ?Social History:  reports that he quit smoking about 16 months ago. His smoking use included cigarettes. He has never used smokeless tobacco. He reports current alcohol use of about 3.0 standard drinks per week. He reports that he does not use drugs. ? ?No Known Allergies ? ?History reviewed. No pertinent family history. ? ?Prior to Admission medications   ?Medication Sig Start Date End Date Taking? Authorizing Provider  ?B Complex Vitamins (B COMPLEX PO) Take 1 tablet by mouth daily.    [provider]  ?Coenzyme Q10 (CO Q-10 PO) Take 1 capsule by mouth daily.    [provider]  ?docusate sodium (COLACE) 250 MG capsule Take 1 capsule (250 mg total) by mouth daily. 09/25/21   Linwood Dibbles, MD   ?feeding supplement (ENSURE ENLIVE / ENSURE PLUS) LIQD Take 237 mLs by mouth 2 (two) times daily between meals. 08/31/20   Joseph Art, DO  ?furosemide (LASIX) 40 MG tablet Take 1 tablet (40 mg total) by mouth daily. 08/31/20   Joseph Art, DO  ?HYDROcodone-acetaminophen (NORCO/VICODIN) 5-325 MG tablet Take 1 tablet by mouth every 6 (six) hours as needed. 09/25/21   Linwood Dibbles, MD  ? ? ?Physical Exam: ?Vitals:  ? 12/24/21 0400 12/24/21 0530 12/24/21 0630 12/24/21 0653  ?BP: 140/86 (!) 146/78 (!) 147/94   ?Pulse: (!) 114 (!) 110 (!) 112 (!) 106  ?Resp: (!) 26 18 (!) 24 (!) 24  ?Temp:      ?TempSrc:      ?SpO2: 91% 91% 93% 93%  ? ?General: 88 y.o. male resting in bed in NAD ?Eyes: PERRL, normal sclera ?ENMT: Nares patent w/o discharge, orophaynx clear, dentition normal, ears w/o discharge/lesions/ulcers ?Neck: Supple, trachea midline ?Cardiovascular: RRR, +S1, S2, no m/g/r, equal pulses throughout ?Respiratory: CTABL, no w/r/r, normal WOB ?GI: BS+, NDNT, no masses noted, no organomegaly noted ?MSK: No e/c/c; limited ROM LLE d/t pain, multiple excoriations on b/l feet after fall ?Neuro: A&O x 3, other than HoH no focal deficits ?Psyc: Appropriate interaction and affect, calm/cooperative ? ?Data Reviewed: ? ?WBC 14.4 ? ? ?CXR: No active disease. ?XR left femur: Subcapital impaction fracture of the proximal left femur. ?XR left hip: Subcapital  impaction fracture of the proximal left femur. ? ?EKG: sinus tach, no st elevations ? ?Assessment and Plan: ?No notes have been filed under this hospital service. ?Service: Hospitalist ?Left hip fracture ?Fall ?    - admitted to inpt, progressive ?    - Ortho to take to OR today ?    - ASA, SCDs ?    - NPO ?    - PT/OT after procedure ?    - TOC consult ? ?Leukocytosis ?    - likely reactive, follow ? ?Chronic diastolic HF ?    - continue home regimen when confirmed ? ?Peripheral neuropathy ?    - continue home regimen when confirmed ? ?COPD ?    - resume home regimen when  confirmed ? ?Tobacco abuse ?    - counsel against further use ?    - nicotine patch available ? ? Advance Care Planning:   Code Status: DNR ? ?Consults: Orthopedics (Dr. Ophelia Charter) ? ?Family Communication: None at bedside ? ?Severity of Illness: ?The appropriate patient status for this patient is INPATIENT. Inpatient status is judged to be reasonable and necessary in order to provide the required intensity of service to ensure the patient's safety. The patient's presenting symptoms, physical exam findings, and initial radiographic and laboratory data in the context of their chronic comorbidities is felt to place them at high risk for further clinical deterioration. Furthermore, it is not anticipated that the patient will be medically stable for discharge from the hospital within 2 midnights of admission.  ? ?* I certify that at the point of admission it is my clinical judgment that the patient will require inpatient hospital care spanning beyond 2 midnights from the point of admission due to high intensity of service, high risk for further deterioration and high frequency of surveillance required.* ? ?Author: ?Teddy Spike, DO ?12/24/2021 7:25 AM ? ?For on call review [email or web address].  ?

## 2021-12-24 NOTE — Interval H&P Note (Signed)
History and Physical Interval Note: ? ?12/24/2021 ?5:05 PM ? ?Hector Beltran  has presented today for surgery, with the diagnosis of LEFT FEMORAL NECK FRACTURE.  The various methods of treatment have been discussed with the patient and family. After consideration of risks, benefits and other options for treatment, the patient has consented to  Procedure(s): ?ARTHROPLASTY BIPOLAR HIP (HEMIARTHROPLASTY) (Left) as a surgical intervention.  The patient's history has been reviewed, patient examined, no change in status, stable for surgery.  I have reviewed the patient's chart and labs.  Questions were answered to the patient's satisfaction.   ? ? ?[name] ? ? ?

## 2021-12-24 NOTE — Anesthesia Pain Management Evaluation Note (Signed)
?  Anesthesia Pain Consult Note ? ?Patient: Hector Beltran, 88 y.o., male ? ?Consult Requested by: Teddy Spike, DO ? ?Reason for Consult: hip fracture ? ?Level of Consciousness: alert ? ?Pain: mild, moderate  ? ?Last Vitals:  ?Vitals:  ? 12/24/21 1300 12/24/21 1543  ?BP: (!) 145/79 (!) 150/88  ?Pulse: 87 100  ?Resp: 17 (!) 24  ?Temp:  (!) 36.4 ?C  ?SpO2: 96% (!) 88%  ? ? ?Plan: Peripheral nerve block for pain control ? ?Risks of wet tap, epidural hematoma and spinal cord injury explained to:  ? ?Consent:Risks of procedure as well as the alternatives and risks of each were explained to the (patient/caregiver).  Consent for procedure obtained. ?  ?No Known Allergies ? ?Physical exam: ?PULM normal and clear to auscultation  ?CARDIO Heart sounds are normal.  Regular rate and rhythm without murmur, gallop or rub.  ?OTHER   ? ?I have reviewed the patient's medications listed below. ?? chlorhexidine  60 mL Topical Once  ?? fentaNYL (SUBLIMAZE) injection  50-100 mcg Intravenous UD  ?? povidone-iodine  2 application. Topical Once  ? ?? [START ON 12/25/2021]  ceFAZolin (ANCEF) IV    ?? tranexamic acid    ? ?[MAR Hold] acetaminophen **OR** [MAR Hold] acetaminophen, [MAR Hold] fentaNYL (SUBLIMAZE) injection, [MAR Hold] naLOXone (NARCAN)  injection, [MAR Hold] ondansetron (ZOFRAN) IV ? ?Past Medical History:  ?Diagnosis Date  ?? Constipation   ?? COVID 05/27/2021  ?? Gait abnormality 02/09/2020  ?? Neuropathy   ?? Peripheral neuropathy 09/30/2017  ?? SVT (supraventricular tachycardia) ([facility])   ? ?Past Surgical History:  ?Procedure Laterality Date  ?? CYST REMOVAL NECK    ? ? reports that he quit smoking about 16 months ago. His smoking use included cigarettes. He has never used smokeless tobacco. He reports current alcohol use of about 3.0 standard drinks per week. He reports that he does not use drugs.  ? ? ?Shataya Winkles L Aracelly Tencza ?12/24/2021 ? ? ? ? ?

## 2021-12-24 NOTE — ED Notes (Signed)
Patient returned back from PACU at this time.  ?

## 2021-12-24 NOTE — Progress Notes (Addendum)
Patient from ED, RN stated he Desats easily.  O2 6/ Orr; pt. Continued to sat mid to upper 80's even with encouragement. Pt. States he is NOT short of breath.  RR 24.   ? ?1620  Desated to 82- placed on simple mask- O2Sat now upper 96-100% ? ? ?1650 Called Pharmacy for Med Rec prior to surgery ?

## 2021-12-24 NOTE — Consult Note (Signed)
? ? ?Reason for Consult: Left displaced femoral neck fracture ?Referring Physician: Cherylann Ratel, DO ? ?Hector Beltran is an 88 y.o. male.  ?HPI: 88 year old male has a walker at home ambulates and states he got up to the bathroom at night lost his balance, fell and injured his left hip.  Had immediate pain, he was unable to walk.  Had severe pain with motion of his left hip.  Patient crawled to the phone he was brought to the ER by EMS. ? ?Past Medical History:  ?Diagnosis Date  ? Constipation   ? COVID 05/27/2021  ? Gait abnormality 02/09/2020  ? Neuropathy   ? Peripheral neuropathy 09/30/2017  ? SVT (supraventricular tachycardia) ([place])   ? ? ?Past Surgical History:  ?Procedure Laterality Date  ? CYST REMOVAL NECK    ? ? ?History reviewed. No pertinent family history. ? ?Social History:  reports that he quit smoking about 16 months ago. His smoking use included cigarettes. He has never used smokeless tobacco. He reports current alcohol use of about 3.0 standard drinks per week. He reports that he does not use drugs. ? ?Allergies: No Known Allergies ? ?Medications: I have reviewed the patient's current medications. ? ?Results for orders placed or performed during the hospital encounter of 12/24/21 (from the past 48 hour(s))  ?CBC with Differential/Platelet     Status: Abnormal  ? Collection Time: 12/24/21  3:50 AM  ?Result Value Ref Range  ? WBC 14.4 (H) 4.0 - 10.5 K/uL  ? RBC 4.90 4.22 - 5.81 MIL/uL  ? Hemoglobin 15.6 13.0 - 17.0 g/dL  ? HCT 47.7 39.0 - 52.0 %  ? MCV 97.3 80.0 - 100.0 fL  ? MCH 31.8 26.0 - 34.0 pg  ? MCHC 32.7 30.0 - 36.0 g/dL  ? RDW 13.0 11.5 - 15.5 %  ? Platelets 186 150 - 400 K/uL  ? nRBC 0.0 0.0 - 0.2 %  ? Neutrophils Relative % 88 %  ? Neutro Abs 12.8 (H) 1.7 - 7.7 K/uL  ? Lymphocytes Relative 6 %  ? Lymphs Abs 0.8 0.7 - 4.0 K/uL  ? Monocytes Relative 4 %  ? Monocytes Absolute 0.5 0.1 - 1.0 K/uL  ? Eosinophils Relative 1 %  ? Eosinophils Absolute 0.1 0.0 - 0.5 K/uL  ? Basophils Relative 0 %  ?  Basophils Absolute 0.0 0.0 - 0.1 K/uL  ? Immature Granulocytes 1 %  ? Abs Immature Granulocytes 0.12 (H) 0.00 - 0.07 K/uL  ?  Comment: Performed at [facility], [place] [address], [place], [place] [address]  ?Basic metabolic panel     Status: Abnormal  ? Collection Time: 12/24/21  3:50 AM  ?Result Value Ref Range  ? Sodium 136 135 - 145 mmol/L  ? Potassium 4.0 3.5 - 5.1 mmol/L  ? Chloride 100 98 - 111 mmol/L  ? CO2 30 22 - 32 mmol/L  ? Glucose, Bld 110 (H) 70 - 99 mg/dL  ?  Comment: Glucose reference range applies only to samples taken after fasting for at least 8 hours.  ? BUN 20 8 - 23 mg/dL  ? Creatinine, Ser 0.79 0.61 - 1.24 mg/dL  ? Calcium 8.9 8.9 - 10.3 mg/dL  ? GFR, Estimated >60 >60 mL/min  ?  Comment: (NOTE) ?Calculated using the CKD-EPI Creatinine Equation (2021) ?  ? Anion gap 6 5 - 15  ?  Comment: Performed at [facility], [place] [address] [place], [place] [address]  ?Type and screen [facility]     Status: None  ? Collection Time: 12/24/21  3:50 AM  ?Result Value Ref Range  ? ABO/RH(D) O POS   ? Antibody Screen NEG   ? Sample Expiration    ?  12/27/2021,2359 ?Performed at [facility], [place] [address] [place], [place] [address] ?  ?Protime-INR     Status: None  ? Collection Time: 12/24/21  3:50 AM  ?Result Value Ref Range  ? Prothrombin Time 13.3 11.4 - 15.2 seconds  ? INR 1.0 0.8 - 1.2  ?  Comment: (NOTE) ?INR goal varies based on device and disease states. ?Performed at [facility], [place] [address] ?[place], [place] [address] ?  ?ABO/Rh     Status: None  ? Collection Time: 12/24/21  3:51 AM  ?Result Value Ref Range  ? ABO/RH(D)    ?  O POS ?Performed at [facility], [place] [address], [place], [place] [address] ?  ?Resp Panel by RT-PCR (Flu A&B, Covid) Nasopharyngeal Swab     Status: None  ? Collection Time: 12/24/21  4:00 AM  ? Specimen: Nasopharyngeal Swab; Nasopharyngeal(NP) swabs in vial  transport medium  ?Result Value Ref Range  ? SARS Coronavirus 2 by RT PCR NEGATIVE NEGATIVE  ?  Comment: (NOTE) ?SARS-CoV-2 target nucleic acids are NOT DETECTED. ? ?The SARS-CoV-2 RNA is generally detectable in upper respiratory ?specimens during the acute phase of infection. The lowest ?concentration of SARS-CoV-2 viral copies this assay can detect is ?138 copies/mL. A negative result does not preclude SARS-Cov-2 ?infection and should not be used as the sole basis for treatment or ?other patient management decisions. A negative result may occur with  ?improper specimen collection/handling, submission of specimen other ?than nasopharyngeal swab, presence of viral mutation(s) within the ?areas targeted by this assay, and inadequate number of viral ?copies(<138 copies/mL). A negative result must be combined with ?clinical observations, patient history, and epidemiological ?information. The expected result is Negative. ? ?Fact Sheet for Patients:  ?EntrepreneurPulse.com.au ? ?Fact Sheet for Healthcare Providers:  ?IncredibleEmployment.be ? ?This test is no t yet approved or cleared by the [place] FDA and  ?has been authorized for detection and/or diagnosis of SARS-CoV-2 by ?FDA under an Emergency Use Authorization (EUA). This EUA will remain  ?in effect (meaning this test can be used) for the duration of the ?COVID-19 declaration under Section 564(b)(1) of the Act, 21 ?U.S.C.section 360bbb-3(b)(1), unless the authorization is terminated  ?or revoked sooner.  ? ? ?  ? Influenza A by PCR NEGATIVE NEGATIVE  ? Influenza B by PCR NEGATIVE NEGATIVE  ?  Comment: (NOTE) ?The Xpert Xpress SARS-CoV-2/FLU/RSV plus assay is intended as an aid ?in the diagnosis of influenza from Nasopharyngeal swab specimens and ?should not be used as a sole basis for treatment. Nasal washings and ?aspirates are unacceptable for Xpert Xpress SARS-CoV-2/FLU/RSV ?testing. ? ?Fact Sheet for  Patients: ?EntrepreneurPulse.com.au ? ?Fact Sheet for Healthcare Providers: ?IncredibleEmployment.be ? ?This test is not yet approved or cleared by the [place] FDA and ?has been authorized for detection and/or diagnosis of SARS-CoV-2 by ?FDA under an Emergency Use Authorization (EUA). This EUA will remain ?in effect (meaning this test can be used) for the duration of the ?COVID-19 declaration under Section 564(b)(1) of the Act, 21 U.S.C. ?section 360bbb-3(b)(1), unless the authorization is terminated or ?revoked. ? ?Performed at [facility], [place] [address] ?[place], [place] [address] ?  ? ? ?DG Chest Port 1 View ? ?Result  Date: 12/24/2021 ?CLINICAL DATA:  Preop, hip fracture. EXAM: PORTABLE CHEST 1 VIEW COMPARISON:  09/25/2021. FINDINGS: The heart is enlarged and the mediastinal contour is within normal limits. Atherosclerotic calcification of the aorta is noted. No consolidation, effusion, or pneumothorax. No acute osseous abnormality. IMPRESSION: No active disease. Electronically Signed   By: Brett Fairy M.D.   On: 12/24/2021 04:08  ? ?DG HIP UNILAT WITH PELVIS 2-3 VIEWS LEFT ? ?Result Date: 12/24/2021 ?CLINICAL DATA:  Fall, landed on left side with left hip pain. EXAM: DG HIP (WITH OR WITHOUT PELVIS) 2-3V LEFT; LEFT FEMUR 2 VIEWS COMPARISON:  01/22/2016. FINDINGS: There is a subcapital impaction fracture of the proximal left femur with mild superior subluxation of the distal fracture fragment. No dislocation. Degenerative changes are present at the hips bilaterally, left knee, and in the lower lumbar spine. A trace suprapatellar joint effusion is noted on the right. Vascular calcifications are noted in the soft tissues. IMPRESSION: Subcapital impaction fracture of the proximal left femur. Electronically Signed   By: Brett Fairy M.D.   On: 12/24/2021 03:50  ? ?DG FEMUR MIN 2 VIEWS LEFT ? ?Result Date: 12/24/2021 ?CLINICAL DATA:  Fall, landed on left  side with left hip pain. EXAM: DG HIP (WITH OR WITHOUT PELVIS) 2-3V LEFT; LEFT FEMUR 2 VIEWS COMPARISON:  01/22/2016. FINDINGS: There is a subcapital impaction fracture of the proximal left femur with mild superior subl

## 2021-12-24 NOTE — ED Provider Notes (Signed)
? ?[facility] DEPT ?Provider Note: Georgena Spurling, MD, [place] ? ?CSN: [id number] ?MRN: [id number] ?ARRIVAL: 12/24/21 at [place] ?ROOM: WA21/WA21 ? ? ?CHIEF COMPLAINT  ?Fall ? ? ?HISTORY OF PRESENT ILLNESS  ?12/24/21 3:48 AM ?Hector Beltran is a 88 y.o. male who got up to use the bathroom at his nursing home, lost his balance and fell to the ground.  He did not hit his head but did experience immediate pain in his left thigh.  He is not able to bear weight on his left lower extremity.  He rates the pain as a 10 out of 10, worse with attempted movement of the left leg.  EMS also notes abrasions to his toes.  The patient was noted to be hypoxic down to the 80s and tachypneic although he denies being short of breath.  Oxygen saturation improved with oxygen by nasal cannula. ? ? ?Past Medical History:  ?Diagnosis Date  ? Constipation   ? COVID 05/27/2021  ? Gait abnormality 02/09/2020  ? Neuropathy   ? Peripheral neuropathy 09/30/2017  ? SVT (supraventricular tachycardia) ([place])   ? ? ?Past Surgical History:  ?Procedure Laterality Date  ? CYST REMOVAL NECK    ? ? ?History reviewed. No pertinent family history. ? ?Social History  ? ?Tobacco Use  ? Smoking status: Former  ?  Types: Cigarettes  ?  Quit date: 08/15/2020  ?  Years since quitting: 1.3  ? Smokeless tobacco: Never  ? Tobacco comments:  ?  2 packs per week, quit 08/2020  ?Vaping Use  ? Vaping Use: Never used  ?Substance Use Topics  ? Alcohol use: Yes  ?  Alcohol/week: 3.0 standard drinks  ?  Types: 1 Glasses of wine, 1 Cans of beer, 1 Shots of liquor per week  ?  Comment: occasionally  ? Drug use: No  ? ? ?Prior to Admission medications   ?Medication Sig Start Date End Date Taking? Authorizing Provider  ?B Complex Vitamins (B COMPLEX PO) Take 1 tablet by mouth daily.    [provider]  ?Coenzyme Q10 (CO Q-10 PO) Take 1 capsule by mouth daily.    [provider]  ?docusate sodium (COLACE) 250 MG capsule Take 1 capsule (250 mg total) by mouth daily.  09/25/21   Dorie Rank, MD  ?feeding supplement (ENSURE ENLIVE / ENSURE PLUS) LIQD Take 237 mLs by mouth 2 (two) times daily between meals. 08/31/20   Geradine Girt, DO  ?furosemide (LASIX) 40 MG tablet Take 1 tablet (40 mg total) by mouth daily. 08/31/20   Geradine Girt, DO  ?HYDROcodone-acetaminophen (NORCO/VICODIN) 5-325 MG tablet Take 1 tablet by mouth every 6 (six) hours as needed. 09/25/21   Dorie Rank, MD  ? ? ?Allergies ?Patient has no known allergies. ? ? ?REVIEW OF SYSTEMS  ?Negative except as noted here or in the History of Present Illness. ? ? ?PHYSICAL EXAMINATION  ?Initial Vital Signs ?Blood pressure (!) 147/62, pulse (!) 108, temperature 98.2 ?F (36.8 ?C), temperature source Oral, resp. rate 20, SpO2 91 %. ? ?Examination ?General: Well-developed, well-nourished male in no acute distress; appearance consistent with age of record ?HENT: normocephalic; atraumatic ?Eyes: pupils equal, round and reactive to light; extraocular muscles intact ?Neck: supple ?Heart: regular rate and rhythm; tachycardia ?Lungs: Mild coarse sounds in bases; tachypnea ?Abdomen: soft; nondistended; nontender; bowel sounds present ?Extremities: No deformity; pain on attempted movement of left hip; chronic appearing deformities of toes with superficial abrasions: ? ? ? ? ? ?Neurologic: Awake, alert and oriented;  motor function intact in all extremities and symmetric; no facial droop ?Skin: Warm and dry; maculopapular rash of chest with confluence in areas: ? ? ? ?Psychiatric: Anxious ? ? ?RESULTS  ?Summary of this visit's results, reviewed and interpreted by myself: ? ? EKG Interpretation ? ?Date/Time:  Tuesday December 24 2021 04:10:37 EDT ?Ventricular Rate:  115 ?PR Interval:  177 ?QRS Duration: 96 ?QT Interval:  319 ?QTC Calculation: 442 ?R Axis:   23 ?Text Interpretation: Sinus tachycardia Multiple premature complexes, vent & supraven RSR' in V1 or V2, right VCD or RVH No significant change was found Confirmed by Krystiana Fornes  [phone]) on 12/24/2021 4:15:14 AM ?  ? ?  ? ?Laboratory Studies: ?Results for orders placed or performed during the hospital encounter of 12/24/21 (from the past 24 hour(s))  ?CBC with Differential/Platelet     Status: Abnormal  ? Collection Time: 12/24/21  3:50 AM  ?Result Value Ref Range  ? WBC 14.4 (H) 4.0 - 10.5 K/uL  ? RBC 4.90 4.22 - 5.81 MIL/uL  ? Hemoglobin 15.6 13.0 - 17.0 g/dL  ? HCT 47.7 39.0 - 52.0 %  ? MCV 97.3 80.0 - 100.0 fL  ? MCH 31.8 26.0 - 34.0 pg  ? MCHC 32.7 30.0 - 36.0 g/dL  ? RDW 13.0 11.5 - 15.5 %  ? Platelets 186 150 - 400 K/uL  ? nRBC 0.0 0.0 - 0.2 %  ? Neutrophils Relative % 88 %  ? Neutro Abs 12.8 (H) 1.7 - 7.7 K/uL  ? Lymphocytes Relative 6 %  ? Lymphs Abs 0.8 0.7 - 4.0 K/uL  ? Monocytes Relative 4 %  ? Monocytes Absolute 0.5 0.1 - 1.0 K/uL  ? Eosinophils Relative 1 %  ? Eosinophils Absolute 0.1 0.0 - 0.5 K/uL  ? Basophils Relative 0 %  ? Basophils Absolute 0.0 0.0 - 0.1 K/uL  ? Immature Granulocytes 1 %  ? Abs Immature Granulocytes 0.12 (H) 0.00 - 0.07 K/uL  ?Basic metabolic panel     Status: Abnormal  ? Collection Time: 12/24/21  3:50 AM  ?Result Value Ref Range  ? Sodium 136 135 - 145 mmol/L  ? Potassium 4.0 3.5 - 5.1 mmol/L  ? Chloride 100 98 - 111 mmol/L  ? CO2 30 22 - 32 mmol/L  ? Glucose, Bld 110 (H) 70 - 99 mg/dL  ? BUN 20 8 - 23 mg/dL  ? Creatinine, Ser 0.79 0.61 - 1.24 mg/dL  ? Calcium 8.9 8.9 - 10.3 mg/dL  ? GFR, Estimated >60 >60 mL/min  ? Anion gap 6 5 - 15  ?Type and screen [place]     Status: None (Preliminary result)  ? Collection Time: 12/24/21  3:50 AM  ?Result Value Ref Range  ? ABO/RH(D) PENDING   ? Antibody Screen PENDING   ? Sample Expiration    ?  12/27/2021,2359 ?Performed at [facility], [place] [address], [place],  [address] ?  ? ?Imaging Studies: ?DG Chest Port 1 View ? ?Result Date: 12/24/2021 ?CLINICAL DATA:  Preop, hip fracture. EXAM: PORTABLE CHEST 1 VIEW COMPARISON:  09/25/2021. FINDINGS: The heart is enlarged  and the mediastinal contour is within normal limits. Atherosclerotic calcification of the aorta is noted. No consolidation, effusion, or pneumothorax. No acute osseous abnormality. IMPRESSION: No active disease. Electronically Signed   By: Brett Fairy M.D.   On: 12/24/2021 04:08  ? ?DG HIP UNILAT WITH PELVIS 2-3 VIEWS LEFT ? ?Result Date: 12/24/2021 ?CLINICAL DATA:  Fall, landed on left  side with left hip pain. EXAM: DG HIP (WITH OR WITHOUT PELVIS) 2-3V LEFT; LEFT FEMUR 2 VIEWS COMPARISON:  01/22/2016. FINDINGS: There is a subcapital impaction fracture of the proximal left femur with mild superior subluxation of the distal fracture fragment. No dislocation. Degenerative changes are present at the hips bilaterally, left knee, and in the lower lumbar spine. A trace suprapatellar joint effusion is noted on the right. Vascular calcifications are noted in the soft tissues. IMPRESSION: Subcapital impaction fracture of the proximal left femur. Electronically Signed   By: Brett Fairy M.D.   On: 12/24/2021 03:50  ? ?DG FEMUR MIN 2 VIEWS LEFT ? ?Result Date: 12/24/2021 ?CLINICAL DATA:  Fall, landed on left side with left hip pain. EXAM: DG HIP (WITH OR WITHOUT PELVIS) 2-3V LEFT; LEFT FEMUR 2 VIEWS COMPARISON:  01/22/2016. FINDINGS: There is a subcapital impaction fracture of the proximal left femur with mild superior subluxation of the distal fracture fragment. No dislocation. Degenerative changes are present at the hips bilaterally, left knee, and in the lower lumbar spine. A trace suprapatellar joint effusion is noted on the right. Vascular calcifications are noted in the soft tissues. IMPRESSION: Subcapital impaction fracture of the proximal left femur. Electronically Signed   By: Brett Fairy M.D.   On: 12/24/2021 03:50   ? ?ED COURSE and MDM  ?Nursing notes, initial and subsequent vitals signs, including pulse oximetry, reviewed and interpreted by myself. ? ?Vitals:  ? 12/24/21 0248 12/24/21 0249 12/24/21 0300  12/24/21 0400  ?BP: (!) 147/62  (!) 152/87 140/86  ?Pulse: (!) 108  (!) 115 (!) 114  ?Resp: 20  (!) 27 (!) 26  ?Temp:  98.2 ?F (36.8 ?C)    ?TempSrc:  Oral    ?SpO2: 91%  90% 91%  ? ?Medications  ?fentaNYL (SUBLIMAZE

## 2021-12-24 NOTE — ED Notes (Signed)
Offered patient pain medication, denied it right now. ?

## 2021-12-24 NOTE — Anesthesia Postprocedure Evaluation (Signed)
Anesthesia Post Note ? ?Patient: Hector Beltran ? ?Procedure(s) Performed: ARTHROPLASTY BIPOLAR HIP (HEMIARTHROPLASTY) (Left: Hip) ? ?  ? ?Patient location during evaluation: PACU ?Anesthesia Type: Spinal ?Level of consciousness: awake and alert ?Pain management: pain level controlled ?Vital Signs Assessment: post-procedure vital signs reviewed and stable ?Respiratory status: spontaneous breathing, nonlabored ventilation, respiratory function stable and patient connected to nasal cannula oxygen ?Cardiovascular status: blood pressure returned to baseline and stable ?Postop Assessment: no apparent nausea or vomiting, spinal receding, no backache, no headache and patient able to bend at knees ?Anesthetic complications: no ? ? ?No notable events documented. ? ?Last Vitals:  ?Vitals:  ? 12/24/21 1915 12/24/21 2015  ?BP: 120/86 (!) 153/80  ?Pulse: 89 91  ?Resp: (!) 21 20  ?Temp:    ?SpO2: 94% 95%  ?  ?Last Pain:  ?Vitals:  ? 12/24/21 2015  ?TempSrc:   ?PainSc: 0-No pain  ? ? ?  ?  ?  ?  ?  ?  ? ?[name] ? ? ? ? ?

## 2021-12-24 NOTE — Anesthesia Preprocedure Evaluation (Addendum)
Anesthesia Evaluation  ?Patient identified by MRN, date of birth, ID band ?Patient awake ? ? ? ?Reviewed: ?Allergy & Precautions, NPO status , Patient's Chart, lab work & pertinent test results ? ?Airway ?Mallampati: II ? ?TM Distance: >3 FB ?Neck ROM: Full ? ? ? Dental ?no notable dental hx. ?(+) Teeth Intact, Dental Advisory Given ?  ?Pulmonary ?COPD, former smoker,  ?  ?Pulmonary exam normal ?breath sounds clear to auscultation ? ? ? ? ? ? Cardiovascular ?+CHF  ?Normal cardiovascular exam+ dysrhythmias Supra Ventricular Tachycardia  ?Rhythm:Regular Rate:Normal ? ?TTE 2021 ?1. Poor acoustic windows limit study.  ??2. Left ventricular ejection fraction, by estimation, is 55 to 60%. The  ?left ventricle has normal function. Left ventricular endocardial border  ?not optimally defined to evaluate regional wall motion. Left ventricular  ?diastolic parameters are consistent  ?with Grade I diastolic dysfunction (impaired relaxation).  ??3. Right ventricular systolic function is mildly reduced. The right  ?ventricular size is mildly enlarged. There is mildly elevated pulmonary  ?artery systolic pressure.  ??4. Right atrial size was mildly dilated.  ??5. The mitral valve is normal in structure. Mild mitral valve  ?regurgitation.  ??6. The aortic valve is abnormal. Aortic valve regurgitation is not  ?visualized. Mild aortic valve sclerosis is present, with no evidence of  ?aortic valve stenosis.  ?  ?Neuro/Psych ?negative neurological ROS ? negative psych ROS  ? GI/Hepatic ?negative GI ROS, (+) Cirrhosis  ?  ?  ? ,   ?Endo/Other  ?negative endocrine ROS ? Renal/GU ?negative Renal ROS  ?negative genitourinary ?  ?Musculoskeletal ?negative musculoskeletal ROS ?(+)  ? Abdominal ?  ?Peds ? Hematology ?negative hematology ROS ?(+)   ?Anesthesia Other Findings ? ? Reproductive/Obstetrics ? ?  ? ? ? ? ? ? ? ? ? ? ? ? ? ?  ?  ? ? ? ? ? ? ? ?Anesthesia Physical ?Anesthesia Plan ? ?ASA:  3 ? ?Anesthesia Plan: Spinal  ? ?Post-op Pain Management: Tylenol PO (pre-op)*  ? ?Induction:  ? ?PONV Risk Score and Plan: Treatment may vary due to age or medical condition, Propofol infusion, Dexamethasone and Ondansetron ? ?Airway Management Planned: Natural Airway ? ?Additional Equipment:  ? ?Intra-op Plan:  ? ?Post-operative Plan:  ? ?Informed Consent: I have reviewed the patients History and Physical, chart, labs and discussed the procedure including the risks, benefits and alternatives for the proposed anesthesia with the patient or authorized representative who has indicated his/her understanding and acceptance.  ? ?Patient has DNR.  ?Discussed DNR with patient and Suspend DNR. ?  ?Dental advisory given ? ?Plan Discussed with: CRNA ? ?Anesthesia Plan Comments:   ? ? ? ? ? ? ?Anesthesia Quick Evaluation ? ?

## 2021-12-24 NOTE — Progress Notes (Signed)
?  Carryover admission to the Day Admitter.  I discussed this case with the EDP, Dr. Read Drivers.  Per these discussions: ? ? ?[name] who is being admitted for acute left hip fracture after presenting from skilled nursing facility for acute onset left hip pain after ground-level mechanical fall earlier this evening. ? ?Patient reportedly woke up in the middle of the night to use the bathroom, and while attempting to ambulate to the bathroom tripped, falling on his left side, landing on left hip is present at point of contact, developing acute onset left hip pain from which he is unable to bear weight on the left lower extremity.  No loss of consciousness.  Reportedly did not hit his head.  Ensuing imaging of the left hip revealed acute left hip fracture.  Case was discussed with on-call orthopedic surgery, Dr. Ophelia Charter, Who will formally consult and see the patient in the morning.  Dr. Ophelia Charter conveys that the patient can stay at [facility] long.  EKG reportedly shows no evidence of acute ischemic changes. ? ?I have placed an order for inpatient admission for further evaluation management of acute left hip fracture. ? ?I have placed some additional preliminary admit orders via the adult multi-morbid admission order set. I have also ordered DNR/DNI status on the basis of on-file MOST form.  N.p.o., prn will, prn Zofran.  Fall precautions.  Add on INR. ? ? ? ?Hector Pigg, DO ?Hospitalist ? ?

## 2021-12-24 NOTE — Transfer of Care (Signed)
Immediate Anesthesia Transfer of Care Note ? ?Patient: Hector Beltran ? ?Procedure(s) Performed: ARTHROPLASTY BIPOLAR HIP (HEMIARTHROPLASTY) (Left: Hip) ? ?Patient Location: PACU ? ?Anesthesia Type:Spinal ? ?Level of Consciousness: awake and alert  ? ?Airway & Oxygen Therapy: Patient Spontanous Breathing and Patient connected to face mask ? ?Post-op Assessment: Report given to RN and Post -op Vital signs reviewed and stable ? ?Post vital signs: Reviewed and stable ? ?Last Vitals:  ?Vitals Value Taken Time  ?BP 121/74 12/24/21 1856  ?Temp    ?Pulse    ?Resp 17 12/24/21 1900  ?SpO2    ?Vitals shown include unvalidated device data. ? ?Last Pain:  ?Vitals:  ? 12/24/21 1657  ?TempSrc:   ?PainSc: Asleep  ?   ? ?Patients Stated Pain Goal: 1 (12/24/21 1543) ? ?Complications: No notable events documented. ?

## 2021-12-24 NOTE — Anesthesia Procedure Notes (Signed)
Anesthesia Regional Block: Peng block  ? ?Pre-Anesthetic Checklist: , timeout performed,  Correct Patient, Correct Site, Correct Laterality,  Correct Procedure, Correct Position, site marked,  Risks and benefits discussed,  Surgical consent,  Pre-op evaluation,  At surgeon's request and post-op pain management ? ?Laterality: Left ? ?Prep: Maximum Sterile Barrier Precautions used, chloraprep     ?  ?Needles:  ?Injection technique: Single-shot ? ?Needle Type: Echogenic Stimulator Needle   ? ? ?Needle Length: 9cm  ?Needle Gauge: 22  ? ? ? ?Additional Needles: ? ? ?Procedures:,,,, ultrasound used (permanent image in chart),,    ?Narrative:  ?Start time: 12/24/2021 9:58 AM ?End time: 12/24/2021 10:02 AM ?Injection made incrementally with aspirations every 5 mL. ? ?Performed by: Personally  ?Anesthesiologist: Freddrick March, MD ? ?Additional Notes: ?Monitors applied. No increased pain on injection. No increased resistance to injection. Injection made in 5cc increments. Good needle visualization. Patient tolerated procedure well.  ? ? ? ? ?

## 2021-12-24 NOTE — ED Notes (Signed)
Gave an update to patient son.  ?

## 2021-12-25 ENCOUNTER — Inpatient Hospital Stay (HOSPITAL_COMMUNITY): Payer: Medicare PPO

## 2021-12-25 ENCOUNTER — Encounter (HOSPITAL_COMMUNITY): Payer: Self-pay | Admitting: Orthopaedic Surgery

## 2021-12-25 DIAGNOSIS — S72002A Fracture of unspecified part of neck of left femur, initial encounter for closed fracture: Secondary | ICD-10-CM | POA: Diagnosis not present

## 2021-12-25 LAB — D-DIMER, QUANTITATIVE: D-Dimer, Quant: 3.58 ug/mL-FEU — ABNORMAL HIGH (ref 0.00–0.50)

## 2021-12-25 LAB — CBC
HCT: 39.7 % (ref 39.0–52.0)
Hemoglobin: 12.9 g/dL — ABNORMAL LOW (ref 13.0–17.0)
MCH: 31.9 pg (ref 26.0–34.0)
MCHC: 32.5 g/dL (ref 30.0–36.0)
MCV: 98.3 fL (ref 80.0–100.0)
Platelets: 140 10*3/uL — ABNORMAL LOW (ref 150–400)
RBC: 4.04 MIL/uL — ABNORMAL LOW (ref 4.22–5.81)
RDW: 13 % (ref 11.5–15.5)
WBC: 14.4 10*3/uL — ABNORMAL HIGH (ref 4.0–10.5)
nRBC: 0 % (ref 0.0–0.2)

## 2021-12-25 LAB — BASIC METABOLIC PANEL
Anion gap: 3 — ABNORMAL LOW (ref 5–15)
BUN: 20 mg/dL (ref 8–23)
CO2: 32 mmol/L (ref 22–32)
Calcium: 8.2 mg/dL — ABNORMAL LOW (ref 8.9–10.3)
Chloride: 100 mmol/L (ref 98–111)
Creatinine, Ser: 0.92 mg/dL (ref 0.61–1.24)
GFR, Estimated: >60 mL/min (ref >60)
Glucose, Bld: 132 mg/dL — ABNORMAL HIGH (ref 70–99)
Potassium: 5 mmol/L (ref 3.5–5.1)
Sodium: 135 mmol/L (ref 135–145)

## 2021-12-25 LAB — BRAIN NATRIURETIC PEPTIDE: B Natriuretic Peptide: 210 pg/mL — ABNORMAL HIGH (ref 0.0–100.0)

## 2021-12-25 MED ORDER — FUROSEMIDE 40 MG PO TABS
40.0000 mg | ORAL_TABLET | Freq: Every day | ORAL | Status: DC
  Administered 2021-12-25 – 2021-12-31 (×7): 40 mg via ORAL
  Filled 2021-12-25 (×7): qty 1

## 2021-12-25 MED ORDER — IOHEXOL 350 MG/ML SOLN
80.0000 mL | Freq: Once | INTRAVENOUS | Status: AC | PRN
  Administered 2021-12-25: 80 mL via INTRAVENOUS

## 2021-12-25 NOTE — Progress Notes (Signed)
Initial Nutrition Assessment ? ?DOCUMENTATION CODES:  ? ?Not applicable ? ?INTERVENTION:  ?- continue to encourage intakes of meals.  ? ? ?NUTRITION DIAGNOSIS:  ? ?Increased nutrient needs related to hip fracture, post-op healing as evidenced by estimated needs. ? ?GOAL:  ? ?Patient will meet greater than or equal to 90% of their needs ? ?MONITOR:  ? ?PO intake, Labs, Weight trends ? ?REASON FOR ASSESSMENT:  ? ?Consult ?Hip fracture protocol ? ?ASSESSMENT:  ? ?88 y.o. male with medical history of CHF, peripheral neuropathy, and SVT. He presented to the ED due to L hip pain after a fall. In the ED, imaging showed L hip fracture and he underwent L hip hemiarthroplasty on 4/11. ? ?Patient sitting in the chair with his son at bedside.  ? ?Flow sheet documentation indicates patient ate 100% of breakfast; patient confirms this. Visualized lunch tray with 100% completion. Patient shares that he had ordered vanilla ice cream but not received it; RD provided patient with ice cream from the Nourishment room.  ? ?These two meals provided a total of 1526 kcal and 55 grams protein.  ? ?Patient reports good appetite now and PTA. He shares that he weighs himself daily and has for several years after having an instance of BLE edema. He reports UBW is 190-195 lb. ? ?Weight yesterday was documented as 195 lb and today as 210 lb. Non-pitting edema documented in the edema section of flow sheet. ? ?Patient is POD #1 L hip hemiarthroplasty. ?  ? ?Labs reviewed; Ca: 8.2 mg/dl.  ?Medications reviewed; 100 mg colace BID, 40 mg oral lasix/day. ? ? ? ?Diet Order:   ?Diet Order   ? ?       ?  Diet Heart Room service appropriate? Yes; Fluid consistency: Thin  Diet effective now       ?  ? ?  ?  ? ?  ? ? ?EDUCATION NEEDS:  ? ?No education needs have been identified at this time ? ?Skin:  Skin Assessment: Skin Integrity Issues: ?Skin Integrity Issues:: Incisions ?Incisions: L hip (4/11) ? ?Last BM:  PTA/unknown ? ?Height:  ? ?Ht Readings from  Last 1 Encounters:  ?12/24/21 6\' 3"  (1.905 m)  ? ? ?Weight:  ? ?Wt Readings from Last 1 Encounters:  ?12/25/21 95.4 kg  ? ? ? ?BMI:  Body mass index is 26.29 kg/m?. ? ?Estimated Nutritional Needs:  ?Kcal:  1900-2150 kcal ?Protein:  95-110 grams ?Fluid:  >/= 2.5 L/day ? ? ? ? ?02/24/22, MS, RD, LDN ?Registered Dietitian II ?Inpatient Clinical Nutrition ?RD pager # and on-call/weekend pager # available in [facility]  ? ?

## 2021-12-25 NOTE — Evaluation (Signed)
Occupational Therapy Evaluation ?Patient Details ?Name: Hector Beltran ?MRN: [id number] ?DOB: [birth year] ?Today's Date: 12/25/2021 ? ? ?History of Present Illness Patient is 88 y.o. male s/p Lt hemiarthroplasty on 12/24/21 due to Lt femoral neck fracture after fall in bathroom at ALF. PMH significant for COVID in 9/22, neuropathy, SVT.  ? ?Clinical Impression ?  ?Mr. Hector Beltran is a 88 year old man admitted to hospital with above medical history and presents with decreased Rom and strength in LLE, decreased activity tolerance, impaired balance and pain. Patient needing mod x 2 to stand and take steps to recliner and increased assistance with ADLs including max-total assist for LB ADLs and toileting. Patient will benefit from skilled OT services while in hospital to improve deficits and learn compensatory strategies as needed in order to return to PLOF.  Recommend short term rehab at discharge.  ?  ?   ? ?Recommendations for follow up therapy are one component of a multi-disciplinary discharge planning process, led by the attending physician.  Recommendations may be updated based on patient status, additional functional criteria and insurance authorization.  ? ?Follow Up Recommendations ? Skilled nursing-short term rehab (<3 hours/day)  ?  ?Assistance Recommended at Discharge Frequent or constant Supervision/Assistance  ?Patient can return home with the following Two people to help with walking and/or transfers;Two people to help with bathing/dressing/bathroom;Help with stairs or ramp for entrance;Assistance with cooking/housework ? ?  ?Functional Status Assessment ? Patient has had a recent decline in their functional status and demonstrates the ability to make significant improvements in function in a reasonable and predictable amount of time.  ?Equipment Recommendations ? None recommended by OT  ?  ?Recommendations for Other Services   ? ? ?  ?Precautions / Restrictions Precautions ?Precautions: Fall;Posterior  Hip ?Restrictions ?Weight Bearing Restrictions: No ?Other Position/Activity Restrictions: WBAT  ? ?  ? ?Mobility Bed Mobility ?  ?  ?  ?  ?  ?  ?  ?  ?  ? ?Transfers ?  ?  ?  ?  ?  ?  ?  ?  ?  ?  ?  ? ?  ?Balance Overall balance assessment: Needs assistance ?Sitting-balance support: Feet supported, Bilateral upper extremity supported ?Sitting balance-Leahy Scale: Fair ?  ?  ?Standing balance support: Reliant on assistive device for balance, During functional activity, Bilateral upper extremity supported ?Standing balance-Leahy Scale: Poor ?  ?  ?  ?  ?  ?  ?  ?  ?  ?  ?  ?  ?   ? ?ADL either performed or assessed with clinical judgement  ? ?ADL Overall ADL's : Needs assistance/impaired ?Eating/Feeding: Independent ?  ?Grooming: Set up;Sitting ?  ?Upper Body Bathing: Set up;Sitting ?  ?Lower Body Bathing: Maximal assistance;Sit to/from stand;+2 for physical assistance ?  ?Upper Body Dressing : Set up;Sitting ?  ?Lower Body Dressing: Total assistance;+2 for physical assistance;Sit to/from stand ?  ?Toilet Transfer: Moderate assistance;+2 for physical assistance;BSC/3in1;Stand-pivot;Rolling walker (2 wheels) ?  ?Toileting- Clothing Manipulation and Hygiene: Total assistance;Sit to/from stand;+2 for physical assistance ?  ?  ?  ?Functional mobility during ADLs: Moderate assistance;+2 for physical assistance;Rolling walker (2 wheels) ?   ? ? ? ?Vision Patient Visual Report: No change from baseline ?   ?   ?Perception   ?  ?Praxis   ?  ? ?Pertinent Vitals/Pain Pain Assessment ?Pain Assessment: Faces ?Faces Pain Scale: Hurts even more ?Pain Location: Lt hip ?Pain Descriptors / Indicators: Discomfort, Grimacing, Guarding, Operative site guarding ?  Pain Intervention(s): Limited activity within patient's tolerance, Premedicated before session  ? ? ? ?Hand Dominance   ?  ?Extremity/Trunk Assessment Upper Extremity Assessment ?Upper Extremity Assessment: RUE deficits/detail;LUE deficits/detail ?RUE Deficits / Details: WFL ROm,  grossly 4/5 strength ?RUE Sensation: WNL ?RUE Coordination: WNL ?LUE Deficits / Details: WFL ROm, grossly 4/5 strength ?LUE Sensation: WNL ?LUE Coordination: WNL ?  ?Lower Extremity Assessment ?Lower Extremity Assessment: Defer to PT evaluation ?LLE: Unable to fully assess due to pain;Unable to fully assess due to immobilization ?  ?Cervical / Trunk Assessment ?Cervical / Trunk Assessment: Kyphotic ?  ?Communication Communication ?Communication: HOH ?  ?Cognition Arousal/Alertness: Awake/alert ?Behavior During Therapy: [facility] for tasks assessed/performed ?Overall Cognitive Status: Within Functional Limits for tasks assessed ?  ?  ?  ?  ?  ?  ?  ?  ?  ?  ?  ?  ?  ?  ?  ?  ?  ?  ?  ?General Comments    ? ?  ?Exercises   ?  ?Shoulder Instructions    ? ? ?Home Living Family/patient expects to be discharged to:: Assisted living ?  ?  ?  ?  ?  ?  ?  ?  ?  ?  ?  ?  ?  ?  ?Home Equipment: Agricultural consultant (2 wheels) ?  ?Additional Comments: [facility]. ?  ? ?  ?Prior Functioning/Environment Prior Level of Function : Independent/Modified Independent;Patient poor historian/Family not available ?  ?  ?  ?  ?  ?  ?Mobility Comments: pt reports does PT 2-3x/week, uses RW for mobility, has not been driving since January due to [facility] per chart but pt reports he is driving still. ?ADLs Comments: reports independent with ADL's ?  ? ?  ?  ?OT Problem List: Decreased strength;Decreased range of motion;Decreased activity tolerance;Impaired balance (sitting and/or standing);Decreased knowledge of use of DME or AE;Decreased knowledge of precautions;Pain;Cardiopulmonary status limiting activity ?  ?   ?OT Treatment/Interventions:    ?  ?OT Goals(Current goals can be found in the care plan section) Acute Rehab OT Goals ?Patient Stated Goal: move better ?OT Goal Formulation: With patient ?Time For Goal Achievement: 01/08/22 ?Potential to Achieve Goals: Good  ?OT Frequency:   ?  ? ?Co-evaluation PT/OT/SLP Co-Evaluation/Treatment: Yes  (co-eval) ?Reason for Co-Treatment: To address functional/ADL transfers;For patient/therapist safety ?PT goals addressed during session: Mobility/safety with mobility;Balance ?OT goals addressed during session: ADL's and self-care;Strengthening/ROM ?  ? ?  ?AM-PAC OT "6 Clicks" Daily Activity     ?Outcome Measure Help from another person eating meals?: None ?Help from another person taking care of personal grooming?: A Little ?Help from another person toileting, which includes using toliet, bedpan, or urinal?: Total ?Help from another person bathing (including washing, rinsing, drying)?: A Lot ?Help from another person to put on and taking off regular upper body clothing?: A Little ?Help from another person to put on and taking off regular lower body clothing?: Total ?6 Click Score: 14 ?  ?End of Session Equipment Utilized During Treatment: Rolling walker (2 wheels);Gait belt ?Nurse Communication: Mobility status ? ?Activity Tolerance: Patient tolerated treatment well ?Patient left: in chair;with call bell/phone within reach ? ?OT Visit Diagnosis: Other abnormalities of gait and mobility (R26.89)  ?              ?Time: 2458-0998 ?OT Time Calculation (min): 26 min ?Charges:  OT General Charges ?$OT Visit: 1 Visit ?OT Evaluation ?$OT Eval Low Complexity: 1 Low ? ?Joceline Hinchcliff, OTR/L ?  Acute Care Rehab Services  ?Office [phone] ?Pager: [phone]  ? ?Rishit Burkhalter L Sirena Riddle ?12/25/2021, 1:06 PM ?

## 2021-12-25 NOTE — Progress Notes (Signed)
?PROGRESS NOTE ? ? ? ?Hector Beltran  [id number] DOB: [birth year] DOA: 12/24/2021 ?PCP: Seward Carol, MD  ? ?  ?Brief Narrative:  ?Hector Beltran is a 88 y.o. male with medical history significant of chronic diastolic HF, peripheral neuropathy, SVT. Presenting with hip pain after a fall. He reports that he went to the bathroom last night and lost his balance while at the toilet. He fell backwards. He didn't hit his head or have LOC. He couldn't get up afterwards due to pain. He had to crawl to get to his phone. Prior to his fall he didn't have any CP or palpitations. He called for EMS to bring him to the ED. He denies any other aggravating or alleviating factors.  Imaging revealed left hip fracture and he underwent left hip hemiarthroplasty 4/11.  ? ?New events last 24 hours / Subjective: ?Patient working with PT OT. He has no complaints, but his SpO2 dropped to 83% while working with PT. Now on 6L O2 and SpO2 improved to 88-91%.  ? ?Assessment & Plan: ?  ?Principal Problem: ?  Closed left hip fracture ([place]) ?Active Problems: ?  COPD (chronic obstructive pulmonary disease) ([place]) ?  Nicotine dependence, cigarettes, uncomplicated ?  Peripheral neuropathy ?  Chronic diastolic HF (heart failure) ([place]) ?  Fall at home, initial encounter ?  Leukocytosis ? ? ?Left hip fracture after mechanical fall ?-Status post left hip hemiarthroplasty with Dr. Rodell Perna 4/11 ?-PT OT recommending SNF placement ? ?Acute hypoxic respiratory failure ?-Requiring 6 L nasal cannula O2 to maintain oxygen saturation above 88% ?-BNP 210.0  ?-D-dimer 3.58 --> obtain CTA chest to rule out PE  ? ?Chronic diastolic heart failure ?-Stop IV fluids ?-Home lasix  ? ? ? ?DVT prophylaxis:  ?SCDs Start: 12/24/21 2031 ?SCDs Start: 12/24/21 1915 ?SCDs Start: 12/24/21 0435 ? ?Code Status: DNR  ?Family Communication: None at bedside  ?Disposition Plan:  ?Status is: Inpatient ?Remains inpatient appropriate because: SNF placement is pending ? ? ?Antimicrobials:   ?Anti-infectives (From admission, onward)  ? ? Start     Dose/Rate Route Frequency Ordered Stop  ? 12/25/21 0600  ceFAZolin (ANCEF) IVPB 2g/100 mL premix       ? 2 g ?200 mL/hr over 30 Minutes Intravenous On call to O.R. 12/24/21 1533 12/24/21 1750  ? ?  ? ? ? ?Objective: ?Vitals:  ? 12/25/21 0847 12/25/21 0851 12/25/21 0852 12/25/21 1215  ?BP:    100/76  ?Pulse:    81  ?Resp:    18  ?Temp:    98.2 ?F (36.8 ?C)  ?TempSrc:    Oral  ?SpO2: 92% (!) 87% 92% 96%  ?Weight:      ?Height:      ? ? ?Intake/Output Summary (Last 24 hours) at 12/25/2021 1356 ?Last data filed at 12/25/2021 1314 ?Gross per 24 hour  ?Intake 2000 ml  ?Output 725 ml  ?Net 1275 ml  ? ?Filed Weights  ? 12/24/21 1543 12/25/21 0649  ?Weight: 88.5 kg 95.4 kg  ? ? ?Examination:  ?General exam: Appears calm and comfortable  ?Respiratory system: Clear to auscultation. Respiratory effort normal. No respiratory distress. No conversational dyspnea.  On 6 L nasal cannula oxygen ?Cardiovascular system: S1 & S2 heard, RRR. No murmurs. No pedal edema. ?Gastrointestinal system: Abdomen is nondistended, soft and nontender. Normal bowel sounds heard. ?Central nervous system: Alert and oriented. No focal neurological deficits. Speech clear.  ?Extremities: Symmetric in appearance  ?Skin: No rashes, lesions or ulcers on exposed skin  ?Psychiatry:  Judgement and insight appear normal. Mood & affect appropriate.  ? ?Data Reviewed: I have personally reviewed following labs and imaging studies ? ?CBC: ?Recent Labs  ?Lab 12/24/21 ?0350 12/25/21 ?0448  ?WBC 14.4* 14.4*  ?NEUTROABS 12.8*  --   ?HGB 15.6 12.9*  ?HCT 47.7 39.7  ?MCV 97.3 98.3  ?PLT 186 140*  ? ?Basic Metabolic Panel: ?Recent Labs  ?Lab 12/24/21 ?0350 12/25/21 ?0448  ?NA 136 135  ?K 4.0 5.0  ?CL 100 100  ?CO2 30 32  ?GLUCOSE 110* 132*  ?BUN 20 20  ?CREATININE 0.79 0.92  ?CALCIUM 8.9 8.2*  ? ?GFR: ?Estimated Creatinine Clearance: 61.2 mL/min (by C-G formula based on SCr of 0.92 mg/dL). ?Liver Function Tests: ?No  results for input(s): AST, ALT, ALKPHOS, BILITOT, PROT, ALBUMIN in the last 168 hours. ?No results for input(s): LIPASE, AMYLASE in the last 168 hours. ?No results for input(s): AMMONIA in the last 168 hours. ?Coagulation Profile: ?Recent Labs  ?Lab 12/24/21 ?0350  ?INR 1.0  ? ?Cardiac Enzymes: ?No results for input(s): CKTOTAL, CKMB, CKMBINDEX, TROPONINI in the last 168 hours. ?BNP (last 3 results) ?No results for input(s): PROBNP in the last 8760 hours. ?HbA1C: ?No results for input(s): HGBA1C in the last 72 hours. ?CBG: ?No results for input(s): GLUCAP in the last 168 hours. ?Lipid Profile: ?No results for input(s): CHOL, HDL, LDLCALC, TRIG, CHOLHDL, LDLDIRECT in the last 72 hours. ?Thyroid Function Tests: ?No results for input(s): TSH, T4TOTAL, FREET4, T3FREE, THYROIDAB in the last 72 hours. ?Anemia Panel: ?No results for input(s): VITAMINB12, FOLATE, FERRITIN, TIBC, IRON, RETICCTPCT in the last 72 hours. ?Sepsis Labs: ?No results for input(s): PROCALCITON, LATICACIDVEN in the last 168 hours. ? ?Recent Results (from the past 240 hour(s))  ?Resp Panel by RT-PCR (Flu A&B, Covid) Nasopharyngeal Swab     Status: None  ? Collection Time: 12/24/21  4:00 AM  ? Specimen: Nasopharyngeal Swab; Nasopharyngeal(NP) swabs in vial transport medium  ?Result Value Ref Range Status  ? SARS Coronavirus 2 by RT PCR NEGATIVE NEGATIVE Final  ?  Comment: (NOTE) ?SARS-CoV-2 target nucleic acids are NOT DETECTED. ? ?The SARS-CoV-2 RNA is generally detectable in upper respiratory ?specimens during the acute phase of infection. The lowest ?concentration of SARS-CoV-2 viral copies this assay can detect is ?138 copies/mL. A negative result does not preclude SARS-Cov-2 ?infection and should not be used as the sole basis for treatment or ?other patient management decisions. A negative result may occur with  ?improper specimen collection/handling, submission of specimen other ?than nasopharyngeal swab, presence of viral mutation(s) within  the ?areas targeted by this assay, and inadequate number of viral ?copies(<138 copies/mL). A negative result must be combined with ?clinical observations, patient history, and epidemiological ?information. The expected result is Negative. ? ?Fact Sheet for Patients:  ?EntrepreneurPulse.com.au ? ?Fact Sheet for Healthcare Providers:  ?IncredibleEmployment.be ? ?This test is no t yet approved or cleared by the [place] FDA and  ?has been authorized for detection and/or diagnosis of SARS-CoV-2 by ?FDA under an Emergency Use Authorization (EUA). This EUA will remain  ?in effect (meaning this test can be used) for the duration of the ?COVID-19 declaration under Section 564(b)(1) of the Act, 21 ?U.S.C.section 360bbb-3(b)(1), unless the authorization is terminated  ?or revoked sooner.  ? ? ?  ? Influenza A by PCR NEGATIVE NEGATIVE Final  ? Influenza B by PCR NEGATIVE NEGATIVE Final  ?  Comment: (NOTE) ?The Xpert Xpress SARS-CoV-2/FLU/RSV plus assay is intended as an aid ?in the diagnosis of influenza  from Nasopharyngeal swab specimens and ?should not be used as a sole basis for treatment. Nasal washings and ?aspirates are unacceptable for Xpert Xpress SARS-CoV-2/FLU/RSV ?testing. ? ?Fact Sheet for Patients: ?EntrepreneurPulse.com.au ? ?Fact Sheet for Healthcare Providers: ?IncredibleEmployment.be ? ?This test is not yet approved or cleared by the [place] FDA and ?has been authorized for detection and/or diagnosis of SARS-CoV-2 by ?FDA under an Emergency Use Authorization (EUA). This EUA will remain ?in effect (meaning this test can be used) for the duration of the ?COVID-19 declaration under Section 564(b)(1) of the Act, 21 U.S.C. ?section 360bbb-3(b)(1), unless the authorization is terminated or ?revoked. ? ?Performed at [facility], [address] ?[place], [place] [address] ?  ?  ? ? ?Radiology Studies: ?Pelvis  Portable ? ?Result Date: 12/24/2021 ?CLINICAL DATA:  Status post left hip arthroplasty. EXAM: PORTABLE PELVIS 1-2 VIEWS COMPARISON:  Pelvis and right hip radiographs 01/22/2016, pelvis and left hip radiographs 04/11

## 2021-12-25 NOTE — Evaluation (Signed)
Physical Therapy Evaluation ?Patient Details ?Name: Hector Beltran ?MRN: [id number] ?DOB: [birth year] ?Today's Date: 12/25/2021 ? ?History of Present Illness ? Patient is 88 y.o. male s/p Lt hemiarthroplasty on 12/24/21 due to Lt femoral neck fracture after fall in bathroom at ALF. PMH significant for COVID in 9/22, neuropathy, SVT. ? ?  ?Clinical Impression ? Hector Beltran is 88 y.o. male admitted with above HPI and diagnosis. Patient is currently limited by functional impairments below (see PT problem list). Patient lives at ALF and per self report is modified independent with RW for mobility at baseline. He currently requires Mod-Max +2 assist for bed mobility and transfers bed<>chair. He required manual assist to maintain posterior hip precautions during mobility. Patient will benefit from continued skilled PT interventions to address impairments and progress independence with mobility, recommending SNF follow for rehab. Acute PT will follow and progress as able.    ?   ? ?Recommendations for follow up therapy are one component of a multi-disciplinary discharge planning process, led by the attending physician.  Recommendations may be updated based on patient status, additional functional criteria and insurance authorization. ? ?Follow Up Recommendations Skilled nursing-short term rehab (<3 hours/day) ? ?  ?Assistance Recommended at Discharge Frequent or constant Supervision/Assistance  ?Patient can return home with the following ? Two people to help with walking and/or transfers;Two people to help with bathing/dressing/bathroom;Assistance with cooking/housework;Direct supervision/assist for medications management;Assist for transportation;Help with stairs or ramp for entrance ? ?  ?Equipment Recommendations None recommended by PT  ?Recommendations for Other Services ?    ?  ?Functional Status Assessment Patient has had a recent decline in their functional status and demonstrates the ability to make significant  improvements in function in a reasonable and predictable amount of time.  ? ?  ?Precautions / Restrictions Precautions ?Precautions: Fall;Posterior Hip ?Restrictions ?Weight Bearing Restrictions: No ?Other Position/Activity Restrictions: WBAT  ? ?  ? ?Mobility ? Bed Mobility ?Overal bed mobility: Needs Assistance ?Bed Mobility: Supine to Sit ?  ?  ?Supine to sit: Mod assist, +2 for physical assistance, +2 for safety/equipment, HOB elevated, Max assist ?  ?  ?  ?  ? ?Transfers ?Overall transfer level: Needs assistance ?Equipment used: Rolling walker (2 wheels) ?Transfers: Sit to/from Stand, Bed to chair/wheelchair/BSC ?Sit to Stand: Mod assist, +2 physical assistance, +2 safety/equipment, From elevated surface ?  ?Step pivot transfers: Mod assist, +2 safety/equipment, +2 physical assistance, From elevated surface ?  ?  ?  ?General transfer comment: cues for hand placment on RW to encourage safe power up for posterior hip precautions. Mod assist to steady and complete rise from EOB. manual blocking/positioning required for Lt LE to maintain posterior hip precuations with rise and during lower to recliner. Mod assist to guide RW position and sequence steps for turn. ?  ? ?Ambulation/Gait ?  ?  ?  ?  ?  ?  ?  ?  ? ?Stairs ?  ?  ?  ?  ?  ? ?Wheelchair Mobility ?  ? ?Modified Rankin (Stroke Patients Only) ?  ? ?  ? ?Balance Overall balance assessment: Needs assistance ?Sitting-balance support: Feet supported, Bilateral upper extremity supported ?Sitting balance-Leahy Scale: Fair ?  ?  ?Standing balance support: Reliant on assistive device for balance, During functional activity, Bilateral upper extremity supported ?Standing balance-Leahy Scale: Poor ?  ?  ?  ?  ?  ?  ?  ?  ?  ?  ?  ?  ?   ? ? ? ?  Pertinent Vitals/Pain Pain Assessment ?Pain Assessment: Faces ?Faces Pain Scale: Hurts even more ?Pain Location: Lt hip ?Pain Descriptors / Indicators: Discomfort, Grimacing, Guarding, Operative site guarding ?Pain  Intervention(s): Limited activity within patient's tolerance, Monitored during session, Repositioned  ? ? ?Home Living Family/patient expects to be discharged to:: Assisted living ?  ?  ?  ?  ?  ?  ?  ?  ?Home Equipment: Agricultural consultant (2 wheels) ?Additional Comments: [facility].  ?  ?Prior Function Prior Level of Function : Independent/Modified Independent;Patient poor historian/Family not available ?  ?  ?  ?  ?  ?  ?Mobility Comments: pt reports does PT 2-3x/week, uses RW for mobility, has not been driving since January due to [facility] per chart but pt reports he is driving still. ?ADLs Comments: reports independent with ADL's ?  ? ? ?Hand Dominance  ?   ? ?  ?Extremity/Trunk Assessment  ? Upper Extremity Assessment ?Upper Extremity Assessment: Defer to OT evaluation ?  ? ?Lower Extremity Assessment ?Lower Extremity Assessment: Generalized weakness;LLE deficits/detail ?LLE: Unable to fully assess due to pain;Unable to fully assess due to immobilization ?  ? ?Cervical / Trunk Assessment ?Cervical / Trunk Assessment: Kyphotic  ?Communication  ? Communication: HOH  ?Cognition Arousal/Alertness: Awake/alert ?Behavior During Therapy: [facility] for tasks assessed/performed ?Overall Cognitive Status: No family/caregiver present to determine baseline cognitive functioning ?  ?  ?  ?  ?  ?  ?  ?  ?  ?  ?  ?  ?  ?  ?  ?  ?General Comments: pt seems oriented to situation but unclear how reliable a historian he is. ?  ?  ? ?  ?General Comments   ? ?  ?Exercises    ? ?Assessment/Plan  ?  ?PT Assessment Patient needs continued PT services  ?PT Problem List Decreased strength;Decreased range of motion;Decreased activity tolerance;Decreased balance;Decreased mobility;Decreased knowledge of use of DME;Decreased knowledge of precautions;Pain;Decreased safety awareness ? ?   ?  ?PT Treatment Interventions DME instruction;Gait training;Stair training;Functional mobility training;Therapeutic activities;Therapeutic exercise;Balance  training;Neuromuscular re-education;Patient/family education   ? ?PT Goals (Current goals can be found in the Care Plan section)  ?Acute Rehab PT Goals ?Patient Stated Goal: regain independence ?PT Goal Formulation: With patient ?Time For Goal Achievement: 01/08/22 ?Potential to Achieve Goals: Good ? ?  ?Frequency Min 3X/week ?  ? ? ?Co-evaluation PT/OT/SLP Co-Evaluation/Treatment: Yes ?Reason for Co-Treatment: To address functional/ADL transfers;For patient/therapist safety ?PT goals addressed during session: Mobility/safety with mobility;Balance ?OT goals addressed during session: ADL's and self-care;Strengthening/ROM ?  ? ? ?  ?AM-PAC PT "6 Clicks" Mobility  ?Outcome Measure Help needed turning from your back to your side while in a flat bed without using bedrails?: A Lot ?Help needed moving from lying on your back to sitting on the side of a flat bed without using bedrails?: Total ?Help needed moving to and from a bed to a chair (including a wheelchair)?: A Lot ?Help needed standing up from a chair using your arms (e.g., wheelchair or bedside chair)?: A Lot ?Help needed to walk in hospital room?: Total ?Help needed climbing 3-5 steps with a railing? : Total ?6 Click Score: 9 ? ?  ?End of Session Equipment Utilized During Treatment: Gait belt;Oxygen ?Activity Tolerance: Patient tolerated treatment well ?Patient left: in chair;with call bell/phone within reach (no posy box in room) ?Nurse Communication: Mobility status ?PT Visit Diagnosis: Unsteadiness on feet (R26.81);Muscle weakness (generalized) (M62.81);History of falling (Z91.81);Difficulty in walking, not elsewhere classified (R26.2);Pain ?Pain - Right/Left:  Left ?Pain - part of body: Hip ?  ? ?Time: 7829-56210939-1005 ?PT Time Calculation (min) (ACUTE ONLY): 26 min ? ? ?Charges:   PT Evaluation ?$PT Eval Low Complexity: 1 Low ?  ?  ?   ? ? ?Renaldo Fiddlerachel Q. PT, DPT ?Acute Rehabilitation Services ?Office [phone] ?Pager [phone]  ? ?Anitra Lauthachel E Quinn-Brown ?12/25/2021,  12:13 PM ? ?

## 2021-12-26 DIAGNOSIS — S72002A Fracture of unspecified part of neck of left femur, initial encounter for closed fracture: Secondary | ICD-10-CM | POA: Diagnosis not present

## 2021-12-26 LAB — CBC
HCT: 43.5 % (ref 39.0–52.0)
Hemoglobin: 14.1 g/dL (ref 13.0–17.0)
MCH: 32 pg (ref 26.0–34.0)
MCHC: 32.4 g/dL (ref 30.0–36.0)
MCV: 98.9 fL (ref 80.0–100.0)
Platelets: 143 10*3/uL — ABNORMAL LOW (ref 150–400)
RBC: 4.4 MIL/uL (ref 4.22–5.81)
RDW: 13 % (ref 11.5–15.5)
WBC: 11.5 10*3/uL — ABNORMAL HIGH (ref 4.0–10.5)
nRBC: 0 % (ref 0.0–0.2)

## 2021-12-26 LAB — BASIC METABOLIC PANEL
Anion gap: 5 (ref 5–15)
BUN: 26 mg/dL — ABNORMAL HIGH (ref 8–23)
CO2: 35 mmol/L — ABNORMAL HIGH (ref 22–32)
Calcium: 8.3 mg/dL — ABNORMAL LOW (ref 8.9–10.3)
Chloride: 98 mmol/L (ref 98–111)
Creatinine, Ser: 1.01 mg/dL (ref 0.61–1.24)
GFR, Estimated: >60 mL/min (ref >60)
Glucose, Bld: 100 mg/dL — ABNORMAL HIGH (ref 70–99)
Potassium: 3.9 mmol/L (ref 3.5–5.1)
Sodium: 138 mmol/L (ref 135–145)

## 2021-12-26 MED ORDER — ASPIRIN 325 MG PO TBEC: 325.0000 mg | DELAYED_RELEASE_TABLET | Freq: Every day | ORAL | 0 refills | Status: DC

## 2021-12-26 MED ORDER — HYDROCODONE-ACETAMINOPHEN 5-325 MG PO TABS: 1.0000 | ORAL_TABLET | ORAL | 0 refills | Status: DC | PRN

## 2021-12-26 NOTE — Addendum Note (Signed)
Addendum  created 12/26/21 0755 by Elmer Picker, MD  ? Attestation recorded in Intraprocedure, Flowsheet accepted, Intraprocedure Attestations filed, Intraprocedure Event edited, Optician, dispensing edited  ?  ?

## 2021-12-26 NOTE — Discharge Instructions (Signed)

## 2021-12-26 NOTE — Progress Notes (Signed)
Report from [place], [place]. Care assumed for pt at 1500. Pt resting in chair, eyes closed. Assessment unchanged from AM assessment. Callbell and personal items in reach.  ?

## 2021-12-26 NOTE — [place] FL2 (Signed)
?[place] MEDICAID FL2 LEVEL OF CARE SCREENING TOOL  ?  ? ?IDENTIFICATION  ?Patient Name: ?Hector Beltran Birthdate: March 30, 1928 Sex: male Admission Date (Current Location): ?12/24/2021  ?[place] Number: ?   ?  Facility and Address:  ?[facility],  [address], [place] [address] ?     Provider Number: ?[id number]  ?Attending Physician Name and Address:  ?Noralee Stain, DO ? Relative Name and Phone Number:  ?Leveon Pelzer. ([facility])[phone] ?   ?Current Level of Care: ?Hospital Recommended Level of Care: ?Skilled Nursing Facility Prior Approval Number: ?  ? ?Date Approved/Denied: ?  PASRR Number: ?[id number] A ? ?Discharge Plan: ?SNF ?  ? ?Current Diagnoses: ?Patient Active Problem List  ? Diagnosis Date Noted  ? Closed left hip fracture (HCC) 12/24/2021  ? Chronic diastolic HF (heart failure) (HCC) 12/24/2021  ? Fall at home, initial encounter 12/24/2021  ? Leukocytosis 12/24/2021  ? Acute CHF (congestive heart failure) (HCC) 08/22/2020  ? Cirrhosis of liver (HCC) 08/22/2020  ? COPD (chronic obstructive pulmonary disease) (HCC) 08/22/2020  ? Nicotine dependence, cigarettes, uncomplicated 08/22/2020  ? Hyperkalemia 08/22/2020  ? Hepatic cyst 08/22/2020  ? Elevated troponin level not due myocardial infarction 08/22/2020  ? Acute respiratory failure with hypoxia (HCC) 08/22/2020  ? Gait abnormality 02/09/2020  ? Norovirus 11/22/2018  ? Diarrhea of presumed infectious origin 11/21/2018  ? Peripheral neuropathy 09/30/2017  ? ? ?Orientation RESPIRATION BLADDER Height & Weight   ?  ?Self, Time ? O2 Continent Weight: 95.4 kg ?Height:  6\' 3"  (190.5 cm)  ?BEHAVIORAL SYMPTOMS/MOOD NEUROLOGICAL BOWEL NUTRITION STATUS  ?    Continent Diet (Regular)  ?AMBULATORY STATUS COMMUNICATION OF NEEDS Skin   ?Limited Assist Verbally Surgical wounds (L hip surgical incision) ?  ?  ?  ?    ?     ?     ? ? ?Personal Care Assistance Level of Assistance  ?Feeding, Dressing   ?Feeding assistance: Limited  assistance ?Dressing Assistance: Limited assistance ?   ? ?Functional Limitations Info  ?Sight, Hearing, Speech Sight Info:  (eyeglasses) ?Hearing Info: Adequate ?Speech Info: Adequate  ? ? ?SPECIAL CARE FACTORS FREQUENCY  ?PT (By licensed PT), OT (By licensed OT)   ?  ?PT Frequency:  (5x week) ?OT Frequency:  (5x week) ?  ?  ?  ?   ? ? ?Contractures Contractures Info: Not present  ? ? ?Additional Factors Info  ?Code Status, Allergies Code Status Info:  (DNR) ?Allergies Info:  (NKA) ?  ?  ?  ?   ? ?Current Medications (12/26/2021):  This is the current hospital active medication list ?Current Facility-Administered Medications  ?Medication Dose Route Frequency Provider Last Rate Last Admin  ? acetaminophen (TYLENOL) tablet 325-650 mg  325-650 mg Oral Q6H PRN 12/28/2021, MD      ? aspirin EC tablet 325 mg  325 mg Oral Q breakfast Eldred Manges, MD   325 mg at 12/26/21 1024  ? bisacodyl (DULCOLAX) suppository 10 mg  10 mg Rectal Daily PRN 12/28/21, MD      ? docusate sodium (COLACE) capsule 100 mg  100 mg Oral BID Eldred Manges, MD   100 mg at 12/26/21 1024  ? fentaNYL (SUBLIMAZE) injection 25 mcg  25 mcg Intravenous Q2H PRN Molpus, John, MD   25 mcg at 12/24/21 0525  ? furosemide (LASIX) tablet 40 mg  40 mg Oral Daily 02/23/22, DO   40 mg at 12/26/21 1024  ?  HYDROcodone-acetaminophen (NORCO) 7.5-325 MG per tablet 1-2 tablet  1-2 tablet Oral Q4H PRN Eldred Manges, MD      ? HYDROcodone-acetaminophen (NORCO/VICODIN) 5-325 MG per tablet 1-2 tablet  1-2 tablet Oral Q4H PRN Eldred Manges, MD   1 tablet at 12/25/21 4782  ? menthol-cetylpyridinium (CEPACOL) lozenge 3 mg  1 lozenge Oral PRN Eldred Manges, MD      ? Or  ? phenol (CHLORASEPTIC) mouth spray 1 spray  1 spray Mouth/Throat PRN Eldred Manges, MD      ? metoCLOPramide (REGLAN) tablet 5-10 mg  5-10 mg Oral Q8H PRN Eldred Manges, MD      ? Or  ? metoCLOPramide (REGLAN) injection 5-10 mg  5-10 mg Intravenous Q8H PRN Eldred Manges, MD      ? morphine (PF) 2  MG/ML injection 0.5-1 mg  0.5-1 mg Intravenous Q2H PRN Eldred Manges, MD      ? naloxone [facility]) injection 0.4 mg  0.4 mg Intravenous PRN Howerter, Justin B, DO      ? ondansetron (ZOFRAN) tablet 4 mg  4 mg Oral Q6H PRN Eldred Manges, MD      ? Or  ? ondansetron (ZOFRAN) injection 4 mg  4 mg Intravenous Q6H PRN Eldred Manges, MD      ? ? ? ?Discharge Medications: ?Please see discharge summary for a list of discharge medications. ? ?Relevant Imaging Results: ? ?Relevant Lab Results: ? ? ?Additional Information ? (NF#[phone]) ? ?Lanier Clam, RN ? ? ? ? ?

## 2021-12-26 NOTE — Progress Notes (Signed)
?PROGRESS NOTE ? ? ? ?Hector Beltran  YTK:[id number] DOB: [birth year] DOA: 12/24/2021 ?PCP: Renford Dills, MD  ? ?  ?Brief Narrative:  ?Hector Beltran is a 88 y.o. male with medical history significant of chronic diastolic HF, peripheral neuropathy, SVT. Presenting with hip pain after a fall. He reports that he went to the bathroom last night and lost his balance while at the toilet. He fell backwards. He didn't hit his head or have LOC. He couldn't get up afterwards due to pain. He had to crawl to get to his phone. Prior to his fall he didn't have any CP or palpitations. He called for EMS to bring him to the ED. He denies any other aggravating or alleviating factors.  Imaging revealed left hip fracture and he underwent left hip hemiarthroplasty 4/11.  Due to hypoxia, patient underwent CTA chest which was negative for PE. ? ?New events last 24 hours / Subjective: ?Patient feeling well without any complaints of pain or shortness of breath.  He remains on 5 L nasal cannula O2 satting in the mid 90s.  Oxygen was turned down to 4 L today with saturation maintaining in the 90s. ? ?Assessment & Plan: ?  ?Principal Problem: ?  Closed left hip fracture (HCC) ?Active Problems: ?  COPD (chronic obstructive pulmonary disease) (HCC) ?  Acute respiratory failure with hypoxia (HCC) ?  Nicotine dependence, cigarettes, uncomplicated ?  Peripheral neuropathy ?  Chronic diastolic HF (heart failure) (HCC) ?  Fall at home, initial encounter ?  Leukocytosis ? ? ?Left hip fracture after mechanical fall ?-Status post left hip hemiarthroplasty with Dr. Annell Greening 4/11 ?-PT OT recommending SNF placement ? ?Acute hypoxic respiratory failure ?-BNP 210.0  ?-D-dimer 3.58 --> CTA neg for PE ?-Wean O2 to maintain sat >88%   ? ?Chronic diastolic heart failure ?-Stop IV fluids ?-Continue lasix  ? ?Tobacco abuse ?-States that he has been smoking since he was 88 years old.  Cessation counseling ? ? ? ?DVT prophylaxis:  ?SCDs Start: 12/24/21 2031 ?SCDs  Start: 12/24/21 1915 ?SCDs Start: 12/24/21 0435 ? ?Code Status: DNR  ?Family Communication: None at bedside  ?Disposition Plan:  ?Status is: Inpatient ?Remains inpatient appropriate because: SNF placement is pending ? ? ?Antimicrobials:  ?Anti-infectives (From admission, onward)  ? ? Start     Dose/Rate Route Frequency Ordered Stop  ? 12/25/21 0600  ceFAZolin (ANCEF) IVPB 2g/100 mL premix       ? 2 g ?200 mL/hr over 30 Minutes Intravenous On call to O.R. 12/24/21 1533 12/24/21 1750  ? ?  ? ? ? ?Objective: ?Vitals:  ? 12/25/21 0852 12/25/21 1215 12/25/21 2001 12/26/21 0450  ?BP:  100/76 125/73 (!) 147/73  ?Pulse:  81 93 88  ?Resp:  18 20 20   ?Temp:  98.2 ?F (36.8 ?C) 98.7 ?F (37.1 ?C) 97.8 ?F (36.6 ?C)  ?TempSrc:  Oral Oral Oral  ?SpO2: 92% 96% 98% 99%  ?Weight:      ?Height:      ? ? ?Intake/Output Summary (Last 24 hours) at 12/26/2021 1048 ?Last data filed at 12/26/2021 0945 ?Gross per 24 hour  ?Intake 360 ml  ?Output 3550 ml  ?Net -3190 ml  ? ?Filed Weights  ? 12/24/21 1543 12/25/21 0649  ?Weight: 88.5 kg 95.4 kg  ? ? ?Examination:  ?General exam: Appears calm and comfortable  ?Respiratory system: Clear to auscultation. Respiratory effort normal. No respiratory distress. No conversational dyspnea.  On 4 L nasal cannula oxygen ?Cardiovascular system: S1 & S2  heard, RRR. No murmurs. No pedal edema. ?Gastrointestinal system: Abdomen is nondistended, soft and nontender. Normal bowel sounds heard. ?Central nervous system: Alert and oriented. No focal neurological deficits. Speech clear.  ?Extremities: Symmetric in appearance  ?Skin: No rashes, lesions or ulcers on exposed skin  ?Psychiatry: Judgement and insight appear normal. Mood & affect appropriate.  ? ?Data Reviewed: I have personally reviewed following labs and imaging studies ? ?CBC: ?Recent Labs  ?Lab 12/24/21 ?0350 12/25/21 ?0448 12/26/21 ?0507  ?WBC 14.4* 14.4* 11.5*  ?NEUTROABS 12.8*  --   --   ?HGB 15.6 12.9* 14.1  ?HCT 47.7 39.7 43.5  ?MCV 97.3 98.3 98.9   ?PLT 186 140* 143*  ? ?Basic Metabolic Panel: ?Recent Labs  ?Lab 12/24/21 ?0350 12/25/21 ?0448 12/26/21 ?0507  ?NA 136 135 138  ?K 4.0 5.0 3.9  ?CL 100 100 98  ?CO2 30 32 35*  ?GLUCOSE 110* 132* 100*  ?BUN 20 20 26*  ?CREATININE 0.79 0.92 1.01  ?CALCIUM 8.9 8.2* 8.3*  ? ?GFR: ?Estimated Creatinine Clearance: 55.8 mL/min (by C-G formula based on SCr of 1.01 mg/dL). ?Liver Function Tests: ?No results for input(s): AST, ALT, ALKPHOS, BILITOT, PROT, ALBUMIN in the last 168 hours. ?No results for input(s): LIPASE, AMYLASE in the last 168 hours. ?No results for input(s): AMMONIA in the last 168 hours. ?Coagulation Profile: ?Recent Labs  ?Lab 12/24/21 ?0350  ?INR 1.0  ? ?Cardiac Enzymes: ?No results for input(s): CKTOTAL, CKMB, CKMBINDEX, TROPONINI in the last 168 hours. ?BNP (last 3 results) ?No results for input(s): PROBNP in the last 8760 hours. ?HbA1C: ?No results for input(s): HGBA1C in the last 72 hours. ?CBG: ?No results for input(s): GLUCAP in the last 168 hours. ?Lipid Profile: ?No results for input(s): CHOL, HDL, LDLCALC, TRIG, CHOLHDL, LDLDIRECT in the last 72 hours. ?Thyroid Function Tests: ?No results for input(s): TSH, T4TOTAL, FREET4, T3FREE, THYROIDAB in the last 72 hours. ?Anemia Panel: ?No results for input(s): VITAMINB12, FOLATE, FERRITIN, TIBC, IRON, RETICCTPCT in the last 72 hours. ?Sepsis Labs: ?No results for input(s): PROCALCITON, LATICACIDVEN in the last 168 hours. ? ?Recent Results (from the past 240 hour(s))  ?Resp Panel by RT-PCR (Flu A&B, Covid) Nasopharyngeal Swab     Status: None  ? Collection Time: 12/24/21  4:00 AM  ? Specimen: Nasopharyngeal Swab; Nasopharyngeal(NP) swabs in vial transport medium  ?Result Value Ref Range Status  ? SARS Coronavirus 2 by RT PCR NEGATIVE NEGATIVE Final  ?  Comment: (NOTE) ?SARS-CoV-2 target nucleic acids are NOT DETECTED. ? ?The SARS-CoV-2 RNA is generally detectable in upper respiratory ?specimens during the acute phase of infection. The  lowest ?concentration of SARS-CoV-2 viral copies this assay can detect is ?138 copies/mL. A negative result does not preclude SARS-Cov-2 ?infection and should not be used as the sole basis for treatment or ?other patient management decisions. A negative result may occur with  ?improper specimen collection/handling, submission of specimen other ?than nasopharyngeal swab, presence of viral mutation(s) within the ?areas targeted by this assay, and inadequate number of viral ?copies(<138 copies/mL). A negative result must be combined with ?clinical observations, patient history, and epidemiological ?information. The expected result is Negative. ? ?Fact Sheet for Patients:  ?BloggerCourse.com ? ?Fact Sheet for Healthcare Providers:  ?SeriousBroker.it ? ?This test is no t yet approved or cleared by the [place] FDA and  ?has been authorized for detection and/or diagnosis of SARS-CoV-2 by ?FDA under an Emergency Use Authorization (EUA). This EUA will remain  ?in effect (meaning this test can be used)  for the duration of the ?COVID-19 declaration under Section 564(b)(1) of the Act, 21 ?U.S.C.section 360bbb-3(b)(1), unless the authorization is terminated  ?or revoked sooner.  ? ? ?  ? Influenza A by PCR NEGATIVE NEGATIVE Final  ? Influenza B by PCR NEGATIVE NEGATIVE Final  ?  Comment: (NOTE) ?The Xpert Xpress SARS-CoV-2/FLU/RSV plus assay is intended as an aid ?in the diagnosis of influenza from Nasopharyngeal swab specimens and ?should not be used as a sole basis for treatment. Nasal washings and ?aspirates are unacceptable for Xpert Xpress SARS-CoV-2/FLU/RSV ?testing. ? ?Fact Sheet for Patients: ?BloggerCourse.comhttps://www.fda.gov/media/152166/download ? ?Fact Sheet for Healthcare Providers: ?SeriousBroker.ithttps://www.fda.gov/media/152162/download ? ?This test is not yet approved or cleared by the [place] FDA and ?has been authorized for detection and/or diagnosis of SARS-CoV-2 by ?FDA under  an Emergency Use Authorization (EUA). This EUA will remain ?in effect (meaning this test can be used) for the duration of the ?COVID-19 declaration under Section 564(b)(1) of the Act, 21 U.S.C. ?section 360bbb-3(b)(1), unles

## 2021-12-26 NOTE — TOC Initial Note (Signed)
Transition of Care (TOC) - Initial/Assessment Note  ? ? ?Patient Details  ?Name: Hector Beltran ?MRN: [id number] ?Date of Birth: Nov 29, 1927 ? ?Transition of Care [facility]) CM/SW Contact:    ?Dessa Phi, RN ?Phone Number: ?12/26/2021, 1:39 PM ? ?Clinical Narrative:  Patient agree to SNF-await bed offers. Prefers [name].               ? ? ?Expected Discharge Plan: [place] ?Barriers to Discharge: Continued Medical Work up ? ? ?Patient Goals and CMS Choice ?Patient states their goals for this hospitalization and ongoing recovery are:: Rehab ?CMS Medicare.gov Compare Post Acute Care list provided to:: Patient ?Choice offered to / list presented to : Patient ? ?Expected Discharge Plan and Services ?Expected Discharge Plan: [place] ?  ?Discharge Planning Services: CM Consult ?Post Acute Care Choice: [place] ?  ?                ?  ?  ?  ?  ?  ?  ?  ?  ?  ?  ? ?Prior Living Arrangements/Services ?  ?Lives with:: Spouse ?Patient language and need for interpreter reviewed:: Yes ?Do you feel safe going back to the place where you live?: Yes      ?Need for Family Participation in Patient Care: Yes (Comment) ?Care giver support system in place?: Yes (comment) ?Current home services: DME (rw) ?  ? ?Activities of Daily Living ?Home Assistive Devices/Equipment: Grab bars around toilet, Grab bars in shower, Hand-held shower hose, Walker (specify type), Nebulizer (3/4 wheeled walker) ?ADL Screening (condition at time of admission) ?Patient's cognitive ability adequate to safely complete daily activities?: Yes ?Is the patient deaf or have difficulty hearing?: No ?Does the patient have difficulty seeing, even when wearing glasses/contacts?: No ?Does the patient have difficulty concentrating, remembering, or making decisions?: No ?Patient able to express need for assistance with ADLs?: Yes ?Does the patient have difficulty dressing or bathing?: Yes (secondary to left thigh pain and  weakness) ?Independently performs ADLs?: No (secondary to left thigh pain and weakness) ?Communication: Independent ?Dressing (OT): Needs assistance ?Is this a change from baseline?: Change from baseline, expected to last >3 days ?Grooming: Independent ?Feeding: Independent ?Bathing: Needs assistance ?Is this a change from baseline?: Change from baseline, expected to last >3 days ?Toileting: Dependent ?Is this a change from baseline?: Change from baseline, expected to last >3days ?In/Out Bed: Dependent ?Is this a change from baseline?: Change from baseline, expected to last >3 days ?Walks in Home: Dependent ?Is this a change from baseline?: Change from baseline, expected to last >3 days ?Does the patient have difficulty walking or climbing stairs?: Yes (secondary to left thigh pain and weakness) ?Weakness of Legs: Left (peripheral neuropathy bilaterally) ?Weakness of Arms/Hands: Both ? ?Permission Sought/Granted ?Permission sought to share information with : Case Manager ?Permission granted to share information with : Yes, Verbal Permission Granted ? Share Information with NAME: Case Manager ?   ?   ?   ? ?Emotional Assessment ?Appearance:: Appears stated age ?Attitude/Demeanor/Rapport: Gracious ?Affect (typically observed): Accepting ?Orientation: : Oriented to Self, Oriented to Place, Oriented to  Time, Oriented to Situation ?Alcohol / Substance Use: Not Applicable ?Psych Involvement: No (comment) ? ?Admission diagnosis:  Closed left hip fracture (Hector Beltran) [S72.002A] ?Abrasion of second toe of left foot, initial encounter [S90.415A] ?Closed fracture of left hip, initial encounter (Hector Beltran) [S72.002A] ?Abrasion of second toe of right foot, initial encounter [S90.414A] ?Abrasion of fifth toe of left foot, initial encounter [  S90.415A] ?Fall at nursing home, initial encounter [phone].Hector Beltran, [id number] ?Patient Active Problem List  ? Diagnosis Date Noted  ? Closed left hip fracture (Hector Beltran) 12/24/2021  ? Chronic diastolic HF (heart  failure) (Hector Beltran) 12/24/2021  ? Fall at home, initial encounter 12/24/2021  ? Leukocytosis 12/24/2021  ? Acute CHF (congestive heart failure) (Hector Beltran) 08/22/2020  ? Cirrhosis of liver (Hector Beltran) 08/22/2020  ? COPD (chronic obstructive pulmonary disease) (Hector Beltran) 08/22/2020  ? Nicotine dependence, cigarettes, uncomplicated [id number]  ? Hyperkalemia 08/22/2020  ? Hepatic cyst 08/22/2020  ? Elevated troponin level not due myocardial infarction 08/22/2020  ? Acute respiratory failure with hypoxia (Hector Beltran) 08/22/2020  ? Gait abnormality 02/09/2020  ? Norovirus 11/22/2018  ? Diarrhea of presumed infectious origin 11/21/2018  ? Peripheral neuropathy 09/30/2017  ? ?PCP:  Seward Carol, MD ?Pharmacy:   ?[id number] - [place],  - [address] AT [place] ?[place] ?[place] [address] ?Phone: [phone] Fax: [phone] ? ? ? ? ?Social Determinants of Health (SDOH) Interventions ?  ? ?Readmission Risk Interventions ?   ? View : No data to display.  ?  ?  ?  ? ? ? ?

## 2021-12-26 NOTE — Progress Notes (Signed)
Patient ID: Hector Beltran, male   DOB: 1928-08-10, 88 y.o.   MRN: [id number] ? ? ?Subjective: ?2 Days Post-Op Procedure(s) (LRB): ?ARTHROPLASTY BIPOLAR HIP (HEMIARTHROPLASTY) (Left) ?Patient reports pain as mild and moderate.   ? ?Objective: ?Vital signs in last 24 hours: ?Temp:  [97.8 ?F (36.6 ?C)-98.7 ?F (37.1 ?C)] 97.8 ?F (36.6 ?C) (04/13 0450) ?Pulse Rate:  [81-93] 88 (04/13 0450) ?Resp:  [18-20] 20 (04/13 0450) ?BP: (100-147)/(73-76) 147/73 (04/13 0450) ?SpO2:  [87 %-99 %] 99 % (04/13 0450) ? ?Intake/Output from previous day: ?04/12 0701 - 04/13 0700 ?In: 240 [P.O.:240] ?Out: 3550 [id number] ?Intake/Output this shift: ?No intake/output data recorded. ? ?Recent Labs  ?  12/24/21 ?0350 12/25/21 ?0448 12/26/21 ?0507  ?HGB 15.6 12.9* 14.1  ? ?Recent Labs  ?  12/25/21 ?0448 12/26/21 ?0507  ?WBC 14.4* 11.5*  ?RBC 4.04* 4.40  ?HCT 39.7 43.5  ?PLT 140* 143*  ? ?Recent Labs  ?  12/25/21 ?0448 12/26/21 ?0507  ?NA 135 138  ?K 5.0 3.9  ?CL 100 98  ?CO2 32 35*  ?BUN 20 26*  ?CREATININE 0.92 1.01  ?GLUCOSE 132* 100*  ?CALCIUM 8.2* 8.3*  ? ?Recent Labs  ?  12/24/21 ?0350  ?INR 1.0  ? ? ?Neurologically intact ?CT Angio Chest Pulmonary Embolism (PE) W or WO Contrast ? ?Result Date: 12/25/2021 ?CLINICAL DATA:  Post fall, positive D-dimer, suspected pulmonary embolism EXAM: CT ANGIOGRAPHY CHEST WITH CONTRAST TECHNIQUE: Multidetector CT imaging of the chest was performed using the standard protocol during bolus administration of intravenous contrast. Multiplanar CT image reconstructions and MIPs were obtained to evaluate the vascular anatomy. RADIATION DOSE REDUCTION: This exam was performed according to the departmental dose-optimization program which includes automated exposure control, adjustment of the mA and/or kV according to patient size and/or use of iterative reconstruction technique. CONTRAST:  57mL OMNIPAQUE IOHEXOL 350 MG/ML SOLN COMPARISON:  09/25/2021 and previous FINDINGS: Cardiovascular: Mild predominately  right-sided cardiomegaly. Dilated central pulmonary arteries suggesting pulmonary hypertension. Satisfactory opacification of pulmonary arteries noted, and there is no evidence of pulmonary emboli. Moderate coronary calcifications. Adequate contrast opacification of the thoracic aorta with no evidence of dissection, aneurysm, or stenosis. There is classic 3-vessel brachiocephalic arch anatomy without proximal stenosis. Scattered calcified atheromatous plaque in the arch and descending thoracic segment as well as in the visualized proximal abdominal aorta which is nondilated. Mediastinum/Nodes: No mediastinal hematoma, mass or adenopathy. Lungs/Pleura: Small pleural effusions, new since previous. Dependent atelectasis posteriorly in the lung bases. Lungs otherwise clear. Upper Abdomen: Multiple small hepatic cystic lesions as before, none require follow-up. Left renal cysts as before, do not require follow-up. No acute findings. Musculoskeletal: Spondylitic changes in the lower cervical spine. Anterior vertebral endplate spurring at multiple levels in the mid and lower thoracic spine, and visualized upper lumbar spine. Review of the MIP images confirms the above findings. IMPRESSION: 1. Negative for acute PE or thoracic aortic dissection. 2. Dilated central pulmonary arteries, suggesting pulmonary hypertension 3. New small bilateral pleural effusions 4. Coronary and Aortic Atherosclerosis (ICD10-170.0). Electronically Signed   By: Lucrezia Europe M.D.   On: 12/25/2021 16:51   ? ?Assessment/Plan: ?2 Days Post-Op Procedure(s) (LRB): ?ARTHROPLASTY BIPOLAR HIP (HEMIARTHROPLASTY) (Left) ?Up with therapy, SNF.  ASA one daily for one month.  Norco Rx printed and signed , placed on chart. Likely SNF then back to [facility].   Office 2 wks to see me.  ? ?Hector Beltran ?12/26/2021, 7:30 AM ? ? ? ? ? ?

## 2021-12-27 DIAGNOSIS — S72002A Fracture of unspecified part of neck of left femur, initial encounter for closed fracture: Secondary | ICD-10-CM | POA: Diagnosis not present

## 2021-12-27 LAB — CBC
HCT: 39.6 % (ref 39.0–52.0)
Hemoglobin: 13 g/dL (ref 13.0–17.0)
MCH: 32.2 pg (ref 26.0–34.0)
MCHC: 32.8 g/dL (ref 30.0–36.0)
MCV: 98 fL (ref 80.0–100.0)
Platelets: 144 10*3/uL — ABNORMAL LOW (ref 150–400)
RBC: 4.04 MIL/uL — ABNORMAL LOW (ref 4.22–5.81)
RDW: 12.9 % (ref 11.5–15.5)
WBC: 9.4 10*3/uL (ref 4.0–10.5)
nRBC: 0 % (ref 0.0–0.2)

## 2021-12-27 LAB — BASIC METABOLIC PANEL
Anion gap: 4 — ABNORMAL LOW (ref 5–15)
BUN: 20 mg/dL (ref 8–23)
CO2: 38 mmol/L — ABNORMAL HIGH (ref 22–32)
Calcium: 8.4 mg/dL — ABNORMAL LOW (ref 8.9–10.3)
Chloride: 96 mmol/L — ABNORMAL LOW (ref 98–111)
Creatinine, Ser: 0.79 mg/dL (ref 0.61–1.24)
GFR, Estimated: >60 mL/min (ref >60)
Glucose, Bld: 110 mg/dL — ABNORMAL HIGH (ref 70–99)
Potassium: 4.3 mmol/L (ref 3.5–5.1)
Sodium: 138 mmol/L (ref 135–145)

## 2021-12-27 MED ORDER — IPRATROPIUM-ALBUTEROL 0.5-2.5 (3) MG/3ML IN SOLN: 3.0000 mL | Freq: Four times a day (QID) | RESPIRATORY_TRACT | Status: DC | PRN

## 2021-12-27 NOTE — Progress Notes (Signed)
?PROGRESS NOTE ? ? ? ?TREYVEON OUTING  [id number] DOB: 10-26-1927 DOA: 12/24/2021 ?PCP: Seward Carol, MD  ? ?  ?Brief Narrative:  ?Hector Beltran is a 88 y.o. male with medical history significant of chronic diastolic HF, peripheral neuropathy, SVT. Presenting with hip pain after a fall. He reports that he went to the bathroom last night and lost his balance while at the toilet. He fell backwards. He didn't hit his head or have LOC. He couldn't get up afterwards due to pain. He had to crawl to get to his phone. Prior to his fall he didn't have any CP or palpitations. He called for EMS to bring him to the ED. He denies any other aggravating or alleviating factors.  Imaging revealed left hip fracture and he underwent left hip hemiarthroplasty 4/11.  Due to hypoxia, patient underwent CTA chest which was negative for PE, showed mild pulmonary hypertension. ? ?New events last 24 hours / Subjective: ?Patient feeling well.  He has no complaints on examination today.  Remains stable on 4 L nasal cannula O2 ? ?Assessment & Plan: ?  ?Principal Problem: ?  Closed left hip fracture ([place]) ?Active Problems: ?  COPD (chronic obstructive pulmonary disease) ([place]) ?  Acute respiratory failure with hypoxia ([place]) ?  Nicotine dependence, cigarettes, uncomplicated ?  Peripheral neuropathy ?  Chronic diastolic HF (heart failure) ([place]) ?  Fall at home, initial encounter ?  Leukocytosis ? ? ?Left hip fracture after mechanical fall ?-Status post left hip hemiarthroplasty with Dr. Rodell Perna 4/11 ?-PT OT recommending SNF placement ? ?Acute hypoxic respiratory failure in setting of mild pulm hypertension and COPD  ?-BNP 210.0  ?-D-dimer 3.58 --> CTA neg for PE ?-Wean O2 to maintain sat >88%   ?-Duonebs  ? ?Chronic diastolic heart failure ?-Stop IV fluids ?-Continue lasix  ?-Without exacerbation  ? ?Tobacco abuse ?-States that he has been smoking since he was 88 years old.  Cessation counseling ? ? ? ?DVT prophylaxis:  ?SCDs Start: 12/24/21  2031 ?SCDs Start: 12/24/21 1915 ?SCDs Start: 12/24/21 0435 ? ?Code Status: DNR  ?Family Communication: None at bedside  ?Disposition Plan:  ?Status is: Inpatient ?Remains inpatient appropriate because: SNF placement is pending ? ? ?Antimicrobials:  ?Anti-infectives (From admission, onward)  ? ? Start     Dose/Rate Route Frequency Ordered Stop  ? 12/25/21 0600  ceFAZolin (ANCEF) IVPB 2g/100 mL premix       ? 2 g ?200 mL/hr over 30 Minutes Intravenous On call to O.R. 12/24/21 1533 12/24/21 1750  ? ?  ? ? ? ?Objective: ?Vitals:  ? 12/27/21 0800 12/27/21 0900 12/27/21 1000 12/27/21 1031  ?BP: 131/67 (!) 147/96 139/69   ?Pulse: 69 94 78 94  ?Resp: 20 (!) 23 (!) 21 (!) 25  ?Temp:      ?TempSrc:      ?SpO2: 95% 90%  93%  ?Weight:      ?Height:      ? ? ?Intake/Output Summary (Last 24 hours) at 12/27/2021 1301 ?Last data filed at 12/27/2021 0202 ?Gross per 24 hour  ?Intake 360 ml  ?Output 400 ml  ?Net -40 ml  ? ? ?Filed Weights  ? 12/24/21 1543 12/25/21 0649  ?Weight: 88.5 kg 95.4 kg  ? ? ?Examination:  ?General exam: Appears calm and comfortable  ?Respiratory system: Diminished breath sounds, respiratory effort is normal without distress.  No conversational dyspnea.  On 4 L nasal cannula oxygen ?Cardiovascular system: S1 & S2 heard, RRR. No murmurs. No pedal edema. ?  Gastrointestinal system: Abdomen is nondistended, soft and nontender. Normal bowel sounds heard. ?Central nervous system: Alert and oriented. No focal neurological deficits. Speech clear.  ?Extremities: Symmetric in appearance  ?Skin: No rashes, lesions or ulcers on exposed skin  ?Psychiatry: Judgement and insight appear normal. Mood & affect appropriate.  ? ?Data Reviewed: I have personally reviewed following labs and imaging studies ? ?CBC: ?Recent Labs  ?Lab 12/24/21 ?0350 12/25/21 ?0448 12/26/21 ?0507 12/27/21 ?0501  ?WBC 14.4* 14.4* 11.5* 9.4  ?NEUTROABS 12.8*  --   --   --   ?HGB 15.6 12.9* 14.1 13.0  ?HCT 47.7 39.7 43.5 39.6  ?MCV 97.3 98.3 98.9 98.0  ?PLT  186 140* 143* 144*  ? ? ?Basic Metabolic Panel: ?Recent Labs  ?Lab 12/24/21 ?0350 12/25/21 ?0448 12/26/21 ?0507 12/27/21 ?0501  ?NA 136 135 138 138  ?K 4.0 5.0 3.9 4.3  ?CL 100 100 98 96*  ?CO2 30 32 35* 38*  ?GLUCOSE 110* 132* 100* 110*  ?BUN 20 20 26* 20  ?CREATININE 0.79 0.92 1.01 0.79  ?CALCIUM 8.9 8.2* 8.3* 8.4*  ? ? ?GFR: ?Estimated Creatinine Clearance: 70.4 mL/min (by C-G formula based on SCr of 0.79 mg/dL). ?Liver Function Tests: ?No results for input(s): AST, ALT, ALKPHOS, BILITOT, PROT, ALBUMIN in the last 168 hours. ?No results for input(s): LIPASE, AMYLASE in the last 168 hours. ?No results for input(s): AMMONIA in the last 168 hours. ?Coagulation Profile: ?Recent Labs  ?Lab 12/24/21 ?0350  ?INR 1.0  ? ? ?Cardiac Enzymes: ?No results for input(s): CKTOTAL, CKMB, CKMBINDEX, TROPONINI in the last 168 hours. ?BNP (last 3 results) ?No results for input(s): PROBNP in the last 8760 hours. ?HbA1C: ?No results for input(s): HGBA1C in the last 72 hours. ?CBG: ?No results for input(s): GLUCAP in the last 168 hours. ?Lipid Profile: ?No results for input(s): CHOL, HDL, LDLCALC, TRIG, CHOLHDL, LDLDIRECT in the last 72 hours. ?Thyroid Function Tests: ?No results for input(s): TSH, T4TOTAL, FREET4, T3FREE, THYROIDAB in the last 72 hours. ?Anemia Panel: ?No results for input(s): VITAMINB12, FOLATE, FERRITIN, TIBC, IRON, RETICCTPCT in the last 72 hours. ?Sepsis Labs: ?No results for input(s): PROCALCITON, LATICACIDVEN in the last 168 hours. ? ?Recent Results (from the past 240 hour(s))  ?Resp Panel by RT-PCR (Flu A&B, Covid) Nasopharyngeal Swab     Status: None  ? Collection Time: 12/24/21  4:00 AM  ? Specimen: Nasopharyngeal Swab; Nasopharyngeal(NP) swabs in vial transport medium  ?Result Value Ref Range Status  ? SARS Coronavirus 2 by RT PCR NEGATIVE NEGATIVE Final  ?  Comment: (NOTE) ?SARS-CoV-2 target nucleic acids are NOT DETECTED. ? ?The SARS-CoV-2 RNA is generally detectable in upper respiratory ?specimens during  the acute phase of infection. The lowest ?concentration of SARS-CoV-2 viral copies this assay can detect is ?138 copies/mL. A negative result does not preclude SARS-Cov-2 ?infection and should not be used as the sole basis for treatment or ?other patient management decisions. A negative result may occur with  ?improper specimen collection/handling, submission of specimen other ?than nasopharyngeal swab, presence of viral mutation(s) within the ?areas targeted by this assay, and inadequate number of viral ?copies(<138 copies/mL). A negative result must be combined with ?clinical observations, patient history, and epidemiological ?information. The expected result is Negative. ? ?Fact Sheet for Patients:  ?EntrepreneurPulse.com.au ? ?Fact Sheet for Healthcare Providers:  ?IncredibleEmployment.be ? ?This test is no t yet approved or cleared by the [place] FDA and  ?has been authorized for detection and/or diagnosis of SARS-CoV-2 by ?FDA under an Emergency  Use Authorization (EUA). This EUA will remain  ?in effect (meaning this test can be used) for the duration of the ?COVID-19 declaration under Section 564(b)(1) of the Act, 21 ?U.S.C.section 360bbb-3(b)(1), unless the authorization is terminated  ?or revoked sooner.  ? ? ?  ? Influenza A by PCR NEGATIVE NEGATIVE Final  ? Influenza B by PCR NEGATIVE NEGATIVE Final  ?  Comment: (NOTE) ?The Xpert Xpress SARS-CoV-2/FLU/RSV plus assay is intended as an aid ?in the diagnosis of influenza from Nasopharyngeal swab specimens and ?should not be used as a sole basis for treatment. Nasal washings and ?aspirates are unacceptable for Xpert Xpress SARS-CoV-2/FLU/RSV ?testing. ? ?Fact Sheet for Patients: ?EntrepreneurPulse.com.au ? ?Fact Sheet for Healthcare Providers: ?IncredibleEmployment.be ? ?This test is not yet approved or cleared by the [place] FDA and ?has been authorized for detection and/or  diagnosis of SARS-CoV-2 by ?FDA under an Emergency Use Authorization (EUA). This EUA will remain ?in effect (meaning this test can be used) for the duration of the ?COVID-19 declaration under Section 564(

## 2021-12-27 NOTE — Plan of Care (Signed)
Soul has been oob taking small steps in the room and sitting up in the chair most of the day.  He is using his IS to 750 hourly.  Attempts to wean his O2 from 4lnc have been unsuccessful as he drops below 90% on 3lnc. He reports some soreness along his incision but has declined any pain meds today, stating his pain is manageable.  ?Problem: Health Behavior/Discharge Planning: ?Goal: Ability to manage health-related needs will improve ?Outcome: Progressing ?  ?Problem: Clinical Measurements: ?Goal: Ability to maintain clinical measurements within normal limits will improve ?Outcome: Progressing ?Goal: Respiratory complications will improve ?Outcome: Progressing ?Goal: Cardiovascular complication will be avoided ?Outcome: Progressing ?  ?Problem: Activity: ?Goal: Risk for activity intolerance will decrease ?Outcome: Progressing ?  ?Problem: Skin Integrity: ?Goal: Risk for impaired skin integrity will decrease ?Outcome: Progressing ?  ?

## 2021-12-27 NOTE — Care Management Important Message (Signed)
Important Message ? ?Patient Details IM Letter placed in Patients room. ?Name: Hector Beltran ?MRN: [id number] ?Date of Birth: [birth year] ? ? ?Medicare Important Message Given:  Yes ? ? ? ? ?Caren Macadam ?12/27/2021, 12:51 PM ?

## 2021-12-27 NOTE — TOC Progression Note (Signed)
Transition of Care (TOC) - Progression Note  ? ? ?Patient Details  ?Name: Hector Beltran ?MRN: [id number] ?Date of Birth: 13-Nov-1927 ? ?Transition of Care (TOC) CM/SW Contact  ?Lanier Clam, RN ?Phone Number: ?12/27/2021, 11:39 AM ? ?Clinical Narrative:  Await choice. ?  ?1. ?1.3 mi ?[facility] ?[address] ?[place], [place] [address] ?[phone] ?Overall rating ?Average ?2. ?1.6 mi ?[facility] ?[address] ?[place], [place] [address] ?[phone] ?Overall rating ?Much below average ?3. ?2.1 mi ?[facility] ?[address] ?[place], [place] [address] ?[phone] ?Overall rating ?Much below average ?4. ?2.5 mi ?[facility] ?[address] ?[place], [place] [address] ?[phone] ?Overall rating ?Below average ?5. ?2.8 mi ?[facility] ?[address] ?[place], [place] [address] ?[phone] ?Overall rating ?Average ?6. ?2.8 mi ?[facility] ?[address] ?[place], [place] [address] ?[phone] ?Overall rating ?Much below average ?7. ?3 mi ?[facility] ?[address] ?[place], [place] [address] ?[phone] ?Overall rating ?Much above average ?8. ?3.6 mi ?[facility] ?[address] ?[place], [place] [address] ?[phone] ?Overall rating ?Above average ?9. ?3.6 mi ?[facility] ?[address] ?[place], [place] [address] ?[phone] ?Overall rating ?Much below average ?10. ?3.9 mi ?[facility] ?[address] ?[place], [place] [address] ?[phone] ?Overall rating ?Much below average ?11. ?4.4 mi ?[facility] ?[address] ?[place], [place] [address] ?[phone] ?Overall rating ?Much above average ?12. ?5.5 mi ?[facility] ?[address] ?[place], [place] [address] ?[phone] ?Overall rating ?Above average ?13. ?8.2 mi ?[facility] ?[address] ?[place], [place] [address] ?[phone] ?Overall rating ?Much above average ?14. ?9 mi ?[name] ?[address] ?[place], [place] [address] ?[phone] ?Overall rating ?Above average ?15. ?9.1 mi ?[facility] ?[address] ?[place], [place] [address] ?[phone] ?Overall rating ?Average ?16. ?9.2 mi ?[facility] ?[address] ?[place], [place] [address] ?[phone] ?Overall rating ?Much above average ?17. ?10.8 mi ?[facility] ?[address] ?[place], [place] [address] ?[phone] ?Overall rating ?Much above average ?18. ?12.6 mi ?[facility] ?[address] ?[place], [place] [address] ?[phone] ?Overall rating ?Much below average ?19. ?12.8 mi ?[facility] ?[address] ?[place], [place] [address] ?[phone] ?Overall rating ?Much below average ?20. ?14.2 mi ?[facility] ?[address] ?[place], [place] [address] ?[phone] ?Overall rating ?Below average ?21. ?14.4 mi ?[facility] ?[address] ?[place], [place] [address] ?[phone] ?Overall rating ?Above average ?22. ?14.8 mi ?[facility] ?[address] ?[place], [place] [address] ?[phone] ?Overall rating ?Much above average ?23. ?14.9 mi ?[facility] ?[address] ?[place], [place] [address] ?[phone] ?Overall rating ?Much below average ?24. ?16.5 mi ?[facility] ?[address] [place] [address], [place] [address] ?[phone] ?Overall rating ?Average ?25. ?16.7 mi ?[facility] ?[address] ?[place], [place] [address] ?[phone] ?Overall rating ?Much above average ?26. ?17.9 mi ?[facility] ?[address] ?[place], [place] [address] ?([phone] ?Overall rating ?Below average ?27. ?18.1 mi ?  [facility] ?[address] ?[place], [place] [address] ?[phone] ?Overall rating ?Much below average ?28. ?19.7 mi ?[facility] ?[address] ?[place], [place] [address] ?[phone] ?Overall rating ?Much below average ?29. ?20 mi ?[facility] [place] ?[address] ?[place], [place] [address] ?([phone] ?Overall rating ?Much above average ?30. ?21.1 mi ?[facility] ?[address] ?[place], [place] [address] ?[phone] ?Overall rating ?Much below average ?31. ?21.6 mi ?[facility] ?[address] ?[place], [place] [address] ?[phone] ?Overall rating ?Below average ?32. ?21.6 mi ?[facility] ?[address] ?[place], [place] [address] ?[phone] ?Overall rating ?Average ?33. ?21.8 mi ?[facility] ?[address] ?[place], [place] [address] ?[phone] ?Overall rating ?Much above average ?34. ?22 mi ?[facility] ?[address] ?[place], [place] [address] ?[phone] ?Overall rating ?Much above average ?35. ?22.6 mi ?[facility] ?[address] ?[place], [place] [address] ?[phone] ?Overall rating ?Average ?36. ?22.7 mi ?[facility] ?[address] ?[place], [place] [address] ?[phone] ?Overall rating ?Much below average ?37. ?23.3 mi ?[facility] ?[address] ?[place], [place] [address] ?[phone] ?Overall rating ?Above average ?38. ?23.5 mi ?[facility] ?[address] ?[place], [place] [address] ?[phone] ?Overall rating ?Not available18 ?39. ?24.1 mi ?[facility] ?[address] ?[place], [place] [address] ?[phone] ?Overall rating ?Below average ?40. ?24.2 mi ?[facility] ?[address] ?[place], [place] [address] ?[phone] ?Overall rating ?Below average ?41. ?24.4 mi ?[facility] ?[address] ?[place], [place] [address] ?[phone] ?Overall rating ?Much below average ?42. ?24.5 mi ?[facility] ?[address] ?[place], [place] [address] ?[phone] ?Overall rating ?Above average ?To explore and download nu ? ? ?Expected Discharge Plan: Skilled Nursing Facility ?Barriers to Discharge: Continued Medical Work up ? ?Expected Discharge Plan and Services ?Expected Discharge Plan: Skilled Nursing Facility ?  ?Discharge Planning Services: CM Consult ?Post Acute Care Choice: Skilled Nursing Facility ?  ?                ?  ?  ?  ?  ?  ?  ?  ?  ?  ?  ? ? ?Social Determinants of Health (SDOH) Interventions ?  ? ?Readmission Risk Interventions ?   ? View : No data to display.  ?  ?  ?  ? ? ?

## 2021-12-28 DIAGNOSIS — S72002A Fracture of unspecified part of neck of left femur, initial encounter for closed fracture: Secondary | ICD-10-CM | POA: Diagnosis not present

## 2021-12-28 NOTE — Plan of Care (Signed)
?  Problem: Health Behavior/Discharge Planning: ?Goal: Ability to manage health-related needs will improve ?Outcome: Progressing ?  ?Problem: Clinical Measurements: ?Goal: Cardiovascular complication will be avoided ?Outcome: Progressing ?  ?Problem: Activity: ?Goal: Risk for activity intolerance will decrease ?Outcome: Progressing ?  ?Problem: Skin Integrity: ?Goal: Risk for impaired skin integrity will decrease ?Outcome: Progressing ?  ?

## 2021-12-28 NOTE — Progress Notes (Signed)
Physical Therapy Treatment ?Patient Details ?Name: Hector Beltran ?MRN: [id number] ?DOB: Sep 02, 1928 ?Today's Date: 12/28/2021 ? ? ?History of Present Illness Patient is 88 y.o. male s/p Lt hemiarthroplasty on 12/24/21 due to Lt femoral neck fracture after fall in bathroom at ALF. PMH significant for COVID in 9/22, neuropathy, SVT. ? ?  ?PT Comments  ? ? Pt is progressing toward goals. Increasing activity tolerance, able to amb 8'  before requiring seated rest. SpO2=94-96 on 4L. Reviewed hip exercise program and posterior hip precautions. Continue to recommend SNF post acute. Pt is motivated to work with PT.  ?Recommendations for follow up therapy are one component of a multi-disciplinary discharge planning process, led by the attending physician.  Recommendations may be updated based on patient status, additional functional criteria and insurance authorization. ? ?Follow Up Recommendations ? Skilled nursing-short term rehab (<3 hours/day) ?  ?  ?Assistance Recommended at Discharge Frequent or constant Supervision/Assistance  ?Patient can return home with the following Two people to help with walking and/or transfers;Two people to help with bathing/dressing/bathroom;Assistance with cooking/housework;Direct supervision/assist for medications management;Assist for transportation;Help with stairs or ramp for entrance ?  ?Equipment Recommendations ? None recommended by PT  ?  ?Recommendations for Other Services   ? ? ?  ?Precautions / Restrictions Precautions ?Precautions: Fall;Posterior Hip ?Precaution Comments: monitor O2; reviewed posterior hip precautions. pt is able to recall 1/3 ?Restrictions ?Weight Bearing Restrictions: No ?Other Position/Activity Restrictions: WBAT  ?  ? ?Mobility ? Bed Mobility ?  ?  ?  ?  ?  ?  ?  ?General bed mobility comments: in recliner ?  ? ?Transfers ?  ?Equipment used: Rolling walker (2 wheels) ?Transfers: Sit to/from Stand ?Sit to Stand: Mod assist, +2 physical assistance, +2  safety/equipment ?  ?  ?  ?  ?  ?General transfer comment: repeated x2. incr pt effort on second trial. cues for hand placment on RW to encourage safe power up for posterior hip precautions. Mod assist to steady and complete rise from chair. manual blocking/positioning required for Lt LE to maintain posterior hip precuations with ascent and descent. ?  ? ?Ambulation/Gait ?Ambulation/Gait assistance: Min assist, Mod assist, +2 safety/equipment ?Gait Distance (Feet): 8 Feet ?Assistive device: Rolling walker (2 wheels) ?Gait Pattern/deviations: Step-to pattern, Decreased stance time - left, Trunk flexed ?Gait velocity: decr ?  ?  ?General Gait Details: multi-modal cues for trunk extension/upward gaze, step length and Rw position from self. fatigues rapidly requiring seated rest ? ? ?Stairs ?  ?  ?  ?  ?  ? ? ?Wheelchair Mobility ?  ? ?Modified Rankin (Stroke Patients Only) ?  ? ? ?  ?Balance   ?Sitting-balance support: Feet supported, Bilateral upper extremity supported ?Sitting balance-Leahy Scale: Fair ?  ?  ?Standing balance support: Reliant on assistive device for balance, During functional activity, Bilateral upper extremity supported ?Standing balance-Leahy Scale: Poor ?  ?  ?  ?  ?  ?  ?  ?  ?  ?  ?  ?  ?  ? ?  ?Cognition Arousal/Alertness: Awake/alert ?Behavior During Therapy: [facility] for tasks assessed/performed ?Overall Cognitive Status: Within Functional Limits for tasks assessed ?  ?  ?  ?  ?  ?  ?  ?  ?  ?  ?  ?  ?  ?  ?  ?  ?General Comments: oriented to self and situation, states he is unsure of the day/date ?  ?  ? ?  ?Exercises General Exercises - Lower Extremity ?  Ankle Circles/Pumps: AROM, Both, 10 reps ?Quad Sets: AROM, Both, 10 reps ?Heel Slides: AROM, AAROM, Left, 10 reps ? ?  ?General Comments   ?  ?  ? ?Pertinent Vitals/Pain Pain Assessment ?Pain Assessment: Faces ?Faces Pain Scale: Hurts little more ?Pain Location: Lt hip ?Pain Descriptors / Indicators: Discomfort, Grimacing, Guarding, Operative  site guarding ?Pain Intervention(s): Limited activity within patient's tolerance, Monitored during session, Repositioned  ? ? ?Home Living   ?  ?  ?  ?  ?  ?  ?  ?  ?  ?   ?  ?Prior Function    ?  ?  ?   ? ?PT Goals (current goals can now be found in the care plan section) Acute Rehab PT Goals ?Patient Stated Goal: regain independence ?PT Goal Formulation: With patient ?Time For Goal Achievement: 01/08/22 ?Potential to Achieve Goals: Good ?Progress towards PT goals: Progressing toward goals ? ?  ?Frequency ? ? ? Min 3X/week ? ? ? ?  ?PT Plan Current plan remains appropriate  ? ? ?Co-evaluation   ?  ?  ?  ?  ? ?  ?AM-PAC PT "6 Clicks" Mobility   ?Outcome Measure ? Help needed turning from your back to your side while in a flat bed without using bedrails?: A Lot ?Help needed moving from lying on your back to sitting on the side of a flat bed without using bedrails?: Total ?Help needed moving to and from a bed to a chair (including a wheelchair)?: A Lot ?Help needed standing up from a chair using your arms (e.g., wheelchair or bedside chair)?: A Lot ?Help needed to walk in hospital room?: A Lot ?Help needed climbing 3-5 steps with a railing? : Total ?6 Click Score: 10 ? ?  ?End of Session Equipment Utilized During Treatment: Gait belt;Oxygen ?Activity Tolerance: Patient tolerated treatment well;Patient limited by fatigue ?Patient left: in chair;with call bell/phone within reach;with chair alarm set ?Nurse Communication: Mobility status ?PT Visit Diagnosis: Unsteadiness on feet (R26.81);Muscle weakness (generalized) (M62.81);History of falling (Z91.81);Difficulty in walking, not elsewhere classified (R26.2);Pain ?Pain - Right/Left: Left ?Pain - part of body: Hip ?  ? ? ?Time: [id number] ?PT Time Calculation (min) (ACUTE ONLY): 20 min ? ?Charges:  $Gait Training: 8-22 mins          ?          ? Baxter Flattery, PT ? ?Acute Rehab Dept [facility]) [phone] ?Pager [phone] ? ?12/28/2021 ? ? ? ?Bani Gianfrancesco ?12/28/2021, 12:21 PM ? ?

## 2021-12-28 NOTE — Progress Notes (Signed)
?PROGRESS NOTE ? ? ? ?Hector Beltran  JIR:[id number] DOB: [birth year] DOA: 12/24/2021 ?PCP: Renford Dills, MD  ? ?  ?Brief Narrative:  ?Hector Beltran is a 88 y.o. male with medical history significant of chronic diastolic HF, peripheral neuropathy, SVT. Presenting with hip pain after a fall. He reports that he went to the bathroom the night prior to admission and lost his balance while at the toilet. He fell backwards. He didn't hit his head or have LOC. He couldn't get up afterwards due to pain. He had to crawl to get to his phone. Prior to his fall he didn't have any CP or palpitations. He called for EMS to bring him to the ED. He denies any other aggravating or alleviating factors.  Imaging revealed left hip fracture and he underwent left hip hemiarthroplasty 4/11.  Due to hypoxia, patient underwent CTA chest which was negative for PE, showed mild pulmonary hypertension. ? ? ?New events last 24 hours / Subjective: ?SNF rec'd, son needs to pick a facility and ins needs to approve. Likely not home today. Pt reports feeling fine. No complaints. Not on O2 at home, breathing well on 4 L while here.  ? ?Assessment & Plan: ?  ?Principal Problem: ?  Closed left hip fracture (HCC) ?Active Problems: ?  Peripheral neuropathy ?  COPD (chronic obstructive pulmonary disease) (HCC) ?  Nicotine dependence, cigarettes, uncomplicated ?  Acute respiratory failure with hypoxia (HCC) ?  Chronic diastolic HF (heart failure) (HCC) ?  Fall at home, initial encounter ?  Leukocytosis ? ?Left hip fracture after mechanical fall ? Status post left hip hemiarthroplasty by Dr. Ophelia Charter on 4/11 ? SNF recommended ? Pain control ? Aspirin ? ?COPD ? DuoNebs as needed ? ?Acute respiratory failure with hypoxia (HCC) ? 4 L nasal cannula, maintain oxygen saturation greater than 88% ? ?CHF, diastolic (HCC) ? Continue Lasix 40 mg daily ? ?Leukocytosis ? Resolved ? ?Nutrition Problem: Increased nutrient needs ?Etiology: hip fracture, post-op healing ? ? ?DVT  prophylaxis: SCDs ?Code Status: DNR ?Family Communication: None bedside ?Coming From: Home ?Disposition Plan: SNF ?Barriers to Discharge: SNF placement ? ?Antimicrobials:  ?Anti-infectives (From admission, onward)  ? ? Start     Dose/Rate Route Frequency Ordered Stop  ? 12/25/21 0600  ceFAZolin (ANCEF) IVPB 2g/100 mL premix       ? 2 g ?200 mL/hr over 30 Minutes Intravenous On call to O.R. 12/24/21 1533 12/24/21 1750  ? ?  ? ? ? ?Objective: ?Vitals:  ? 12/28/21 0412 12/28/21 0601 12/28/21 1000 12/28/21 1010  ?BP:   (!) 142/78   ?Pulse:   93 98  ?Resp:  20    ?Temp:  98.1 ?F (36.7 ?C)  97.6 ?F (36.4 ?C)  ?TempSrc:  Oral  Oral  ?SpO2:   94% 93%  ?Weight: 89.5 kg     ?Height:      ? ? ?Intake/Output Summary (Last 24 hours) at 12/28/2021 1157 ?Last data filed at 12/28/2021 0900 ?Gross per 24 hour  ?Intake 550 ml  ?Output 900 ml  ?Net -350 ml  ? ?Filed Weights  ? 12/24/21 1543 12/25/21 0649 12/28/21 0412  ?Weight: 88.5 kg 95.4 kg 89.5 kg  ? ? ?Examination:  ?General exam: Appears calm and comfortable  ?Respiratory system: Clear to auscultation. Respiratory effort normal. No respiratory distress. No conversational dyspnea.  ?Cardiovascular system: S1 & S2 heard, RRR. No murmurs. No pedal edema. ?Central nervous system: Alert and oriented. No focal neurological deficits. Speech clear.  ?Extremities: Symmetric  in appearance  ?Skin: No rashes, lesions or ulcers on exposed skin  ?Psychiatry: Judgement and insight appear normal. Mood & affect appropriate.  ? ?Data Reviewed: I have personally reviewed following labs and imaging studies ? ?CBC: ?Recent Labs  ?Lab 12/24/21 ?0350 12/25/21 ?0448 12/26/21 ?0507 12/27/21 ?0501  ?WBC 14.4* 14.4* 11.5* 9.4  ?NEUTROABS 12.8*  --   --   --   ?HGB 15.6 12.9* 14.1 13.0  ?HCT 47.7 39.7 43.5 39.6  ?MCV 97.3 98.3 98.9 98.0  ?PLT 186 140* 143* 144*  ? ?Basic Metabolic Panel: ?Recent Labs  ?Lab 12/24/21 ?0350 12/25/21 ?0448 12/26/21 ?0507 12/27/21 ?0501  ?NA 136 135 138 138  ?K 4.0 5.0 3.9 4.3   ?CL 100 100 98 96*  ?CO2 30 32 35* 38*  ?GLUCOSE 110* 132* 100* 110*  ?BUN 20 20 26* 20  ?CREATININE 0.79 0.92 1.01 0.79  ?CALCIUM 8.9 8.2* 8.3* 8.4*  ? ?GFR: ?Estimated Creatinine Clearance: 70.4 mL/min (by C-G formula based on SCr of 0.79 mg/dL). ? ?Coagulation Profile: ?Recent Labs  ?Lab 12/24/21 ?0350  ?INR 1.0  ? ? ?Recent Results (from the past 240 hour(s))  ?Resp Panel by RT-PCR (Flu A&B, Covid) Nasopharyngeal Swab     Status: None  ? Collection Time: 12/24/21  4:00 AM  ? Specimen: Nasopharyngeal Swab; Nasopharyngeal(NP) swabs in vial transport medium  ?Result Value Ref Range Status  ? SARS Coronavirus 2 by RT PCR NEGATIVE NEGATIVE Final  ?  Comment: (NOTE) ?SARS-CoV-2 target nucleic acids are NOT DETECTED. ? ?The SARS-CoV-2 RNA is generally detectable in upper respiratory ?specimens during the acute phase of infection. The lowest ?concentration of SARS-CoV-2 viral copies this assay can detect is ?138 copies/mL. A negative result does not preclude SARS-Cov-2 ?infection and should not be used as the sole basis for treatment or ?other patient management decisions. A negative result may occur with  ?improper specimen collection/handling, submission of specimen other ?than nasopharyngeal swab, presence of viral mutation(s) within the ?areas targeted by this assay, and inadequate number of viral ?copies(<138 copies/mL). A negative result must be combined with ?clinical observations, patient history, and epidemiological ?information. The expected result is Negative. ? ?Fact Sheet for Patients:  ?BloggerCourse.comhttps://www.fda.gov/media/152166/download ? ?Fact Sheet for Healthcare Providers:  ?SeriousBroker.ithttps://www.fda.gov/media/152162/download ? ?This test is no t yet approved or cleared by the [place] FDA and  ?has been authorized for detection and/or diagnosis of SARS-CoV-2 by ?FDA under an Emergency Use Authorization (EUA). This EUA will remain  ?in effect (meaning this test can be used) for the duration of the ?COVID-19  declaration under Section 564(b)(1) of the Act, 21 ?U.S.C.section 360bbb-3(b)(1), unless the authorization is terminated  ?or revoked sooner.  ? ? ?  ? Influenza A by PCR NEGATIVE NEGATIVE Final  ? Influenza B by PCR NEGATIVE NEGATIVE Final  ?  Comment: (NOTE) ?The Xpert Xpress SARS-CoV-2/FLU/RSV plus assay is intended as an aid ?in the diagnosis of influenza from Nasopharyngeal swab specimens and ?should not be used as a sole basis for treatment. Nasal washings and ?aspirates are unacceptable for Xpert Xpress SARS-CoV-2/FLU/RSV ?testing. ? ?Fact Sheet for Patients: ?BloggerCourse.comhttps://www.fda.gov/media/152166/download ? ?Fact Sheet for Healthcare Providers: ?SeriousBroker.ithttps://www.fda.gov/media/152162/download ? ?This test is not yet approved or cleared by the [place] FDA and ?has been authorized for detection and/or diagnosis of SARS-CoV-2 by ?FDA under an Emergency Use Authorization (EUA). This EUA will remain ?in effect (meaning this test can be used) for the duration of the ?COVID-19 declaration under Section 564(b)(1) of the Act, 21 U.S.C. ?section  360bbb-3(b)(1), unless the authorization is terminated or ?revoked. ? ?Performed at [facility], [address] ?[place], [place] [address] ?  ? ?Scheduled Meds: ? aspirin EC  325 mg Oral Q breakfast  ? docusate sodium  100 mg Oral BID  ? furosemide  40 mg Oral Daily  ? ?Continuous Infusions: ? ? LOS: 4 days  ? ? ?Time spent: 15 minutes  ? ?Sharlene Dory, DO ?[facility] ?12/28/2021, 11:57 AM  ? ?Available via Epic secure chat 7am-7pm ?After these hours, please refer to coverage provider listed on [email or web address] ? ?

## 2021-12-29 DIAGNOSIS — S72002A Fracture of unspecified part of neck of left femur, initial encounter for closed fracture: Secondary | ICD-10-CM | POA: Diagnosis not present

## 2021-12-29 LAB — BASIC METABOLIC PANEL
Anion gap: 6 (ref 5–15)
BUN: 21 mg/dL (ref 8–23)
CO2: 35 mmol/L — ABNORMAL HIGH (ref 22–32)
Calcium: 8.4 mg/dL — ABNORMAL LOW (ref 8.9–10.3)
Chloride: 98 mmol/L (ref 98–111)
Creatinine, Ser: 0.74 mg/dL (ref 0.61–1.24)
GFR, Estimated: >60 mL/min (ref >60)
Glucose, Bld: 106 mg/dL — ABNORMAL HIGH (ref 70–99)
Potassium: 4 mmol/L (ref 3.5–5.1)
Sodium: 139 mmol/L (ref 135–145)

## 2021-12-29 LAB — CBC
HCT: 39.8 % (ref 39.0–52.0)
Hemoglobin: 13.4 g/dL (ref 13.0–17.0)
MCH: 32.8 pg (ref 26.0–34.0)
MCHC: 33.7 g/dL (ref 30.0–36.0)
MCV: 97.3 fL (ref 80.0–100.0)
Platelets: 179 10*3/uL (ref 150–400)
RBC: 4.09 MIL/uL — ABNORMAL LOW (ref 4.22–5.81)
RDW: 12.8 % (ref 11.5–15.5)
WBC: 9.5 10*3/uL (ref 4.0–10.5)
nRBC: 0 % (ref 0.0–0.2)

## 2021-12-29 NOTE — Progress Notes (Signed)
Patient weaned from 4L to 3L O2 via [place]. Patient tolerating well for full shift and had no c/o SOB. Breathing regular and unlabored. O2 sats 91-94%. ?

## 2021-12-29 NOTE — TOC Progression Note (Signed)
Transition of Care (TOC) - Progression Note  ? ? ?Patient Details  ?Name: Hector Beltran ?MRN: [id number] ?Date of Birth: [birth year] ? ?Transition of Care (TOC) CM/SW Contact  ?Cecille Po, RN ?Phone Number: ?12/29/2021, 1:21 PM ? ?Clinical Narrative:    ? ?[name]. Called admissions, left message.  TOC will follow up Monday.  ? ? ?Expected Discharge Plan: Skilled Nursing Facility ?Barriers to Discharge: Continued Medical Work up ? ?Expected Discharge Plan and Services ?Expected Discharge Plan: Skilled Nursing Facility ?  ?Discharge Planning Services: CM Consult ?Post Acute Care Choice: Skilled Nursing Facility ?  ?                ?  ? ? ?

## 2021-12-29 NOTE — Progress Notes (Signed)
?PROGRESS NOTE ? ? ? ?Hector Beltran  ZOX:[id number] DOB: 02/02/1928 DOA: 12/24/2021 ?PCP: Renford Dills, MD  ? ? ?Brief Narrative:  ?88 y.o. male with medical history significant of chronic diastolic HF, peripheral neuropathy, SVT. Presenting with hip pain after a fall. He reports that he went to the bathroom the night prior to admission and lost his balance while at the toilet. He fell backwards. He didn't hit his head or have LOC. He couldn't get up afterwards due to pain. He had to crawl to get to his phone. Prior to his fall he didn't have any CP or palpitations. He called for EMS to bring him to the ED. He denies any other aggravating or alleviating factors.  Imaging revealed left hip fracture and he underwent left hip hemiarthroplasty 4/11.  Due to hypoxia, patient underwent CTA chest which was negative for PE, showed mild pulmonary hypertension. ? ?Remains on 4 L nasal cannula as of 4/16 ? ? ?Assessment & Plan: ?  ?Principal Problem: ?  Closed left hip fracture (HCC) ?Active Problems: ?  COPD (chronic obstructive pulmonary disease) (HCC) ?  Acute respiratory failure with hypoxia (HCC) ?  Nicotine dependence, cigarettes, uncomplicated ?  Peripheral neuropathy ?  Chronic diastolic HF (heart failure) (HCC) ?  Fall at home, initial encounter ?  Leukocytosis ? ? ?Left hip fracture after mechanical fall ?            Status post left hip hemiarthroplasty by Dr. Ophelia Charter on 4/11 ? Postoperative course uncomplicated ?            SNF recommended ? Plan: ? Await SNF placement.  Patient currently medically stable ?            Pain control ?            Aspirin ?  ?COPD ?            DuoNebs as needed ? Not acutely exacerbated ?  ?Acute respiratory failure with hypoxia (HCC) ?Suspect underlying pulmonary hypertension as primary driver for chronic hypoxia.  Patient notes no deterioration in ability for ADLs.  Reports he is in his church choir and has not experienced any overt shortness of breath.  Volume status  euvolemic ?Plan: ?Continue nasal cannula, wean as tolerated ?Will likely need to oxygen at time of discharge ?  ?CHF, diastolic (HCC) ?            Continue Lasix 40 p.o. mg daily ?  ?Leukocytosis ?            Resolved ? ?Tobacco use ?Per patient he has been smoking since he was 88 years old.  Likely underlying some of the chronic hypoxia. ? ?DVT prophylaxis: SCD; aspirin 325 mg daily per orthopedic recommendations ?Code Status: DNR ?Family Communication: None today ?Disposition Plan: Status is: Inpatient ?Remains inpatient appropriate because: Unsafe discharge plan.  Patient medically stable for discharge at this time.  Pending skilled nursing facility placement. ? ? ?Level of care: Progressive ? ?Consultants:  ?Orthopedics ? ?Procedures:  ?Left hip hemiarthroplasty 4/11 ? ?Antimicrobials: ?None ? ? ?Subjective: ?Patient seen and examined.  Resting comfortably in bed.  No visible distress.  Pain well controlled.  No complaints of shortness of breath or chest pain. ? ?Objective: ?Vitals:  ? 12/29/21 0300 12/29/21 0400 12/29/21 0500 12/29/21 0558  ?BP: 116/64 (!) 115/59 132/78   ?Pulse: 79 70 87   ?Resp: 17 18 20    ?Temp:    (!) 97.4 ?F (36.3 ?C)  ?TempSrc:  Oral  ?SpO2: 95% 94% 94%   ?Weight:   89 kg   ?Height:      ? ? ?Intake/Output Summary (Last 24 hours) at 12/29/2021 1139 ?Last data filed at 12/29/2021 0900 ?Gross per 24 hour  ?Intake 250 ml  ?Output --  ?Net 250 ml  ? ?Filed Weights  ? 12/25/21 0649 12/28/21 0412 12/29/21 0500  ?Weight: 95.4 kg 89.5 kg 89 kg  ? ? ?Examination: ? ?General exam: No acute distress ?Respiratory system: Diminished lung sounds.  Bibasilar crackles.  Normal work of breathing.  4 L ?Cardiovascular system: S1-S2, RRR, no murmurs, no pedal edema ?Gastrointestinal system: Soft, NT/ND, normal bowel sounds ?Central nervous system: Alert and oriented. No focal neurological deficits. ?Extremities: Symmetric 5 x 5 power. ?Skin: No rashes, lesions or ulcers ?Psychiatry: Judgement and insight  appear normal. Mood & affect appropriate.  ? ? ? ?Data Reviewed: I have personally reviewed following labs and imaging studies ? ?CBC: ?Recent Labs  ?Lab 12/24/21 ?0350 12/25/21 ?0448 12/26/21 ?0507 12/27/21 ?0501 12/29/21 ?0500  ?WBC 14.4* 14.4* 11.5* 9.4 9.5  ?NEUTROABS 12.8*  --   --   --   --   ?HGB 15.6 12.9* 14.1 13.0 13.4  ?HCT 47.7 39.7 43.5 39.6 39.8  ?MCV 97.3 98.3 98.9 98.0 97.3  ?PLT 186 140* 143* 144* 179  ? ?Basic Metabolic Panel: ?Recent Labs  ?Lab 12/24/21 ?0350 12/25/21 ?0448 12/26/21 ?0507 12/27/21 ?0501 12/29/21 ?0500  ?NA 136 135 138 138 139  ?K 4.0 5.0 3.9 4.3 4.0  ?CL 100 100 98 96* 98  ?CO2 30 32 35* 38* 35*  ?GLUCOSE 110* 132* 100* 110* 106*  ?BUN 20 20 26* 20 21  ?CREATININE 0.79 0.92 1.01 0.79 0.74  ?CALCIUM 8.9 8.2* 8.3* 8.4* 8.4*  ? ?GFR: ?Estimated Creatinine Clearance: 70.4 mL/min (by C-G formula based on SCr of 0.74 mg/dL). ?Liver Function Tests: ?No results for input(s): AST, ALT, ALKPHOS, BILITOT, PROT, ALBUMIN in the last 168 hours. ?No results for input(s): LIPASE, AMYLASE in the last 168 hours. ?No results for input(s): AMMONIA in the last 168 hours. ?Coagulation Profile: ?Recent Labs  ?Lab 12/24/21 ?0350  ?INR 1.0  ? ?Cardiac Enzymes: ?No results for input(s): CKTOTAL, CKMB, CKMBINDEX, TROPONINI in the last 168 hours. ?BNP (last 3 results) ?No results for input(s): PROBNP in the last 8760 hours. ?HbA1C: ?No results for input(s): HGBA1C in the last 72 hours. ?CBG: ?No results for input(s): GLUCAP in the last 168 hours. ?Lipid Profile: ?No results for input(s): CHOL, HDL, LDLCALC, TRIG, CHOLHDL, LDLDIRECT in the last 72 hours. ?Thyroid Function Tests: ?No results for input(s): TSH, T4TOTAL, FREET4, T3FREE, THYROIDAB in the last 72 hours. ?Anemia Panel: ?No results for input(s): VITAMINB12, FOLATE, FERRITIN, TIBC, IRON, RETICCTPCT in the last 72 hours. ?Sepsis Labs: ?No results for input(s): PROCALCITON, LATICACIDVEN in the last 168 hours. ? ?Recent Results (from the past 240  hour(s))  ?Resp Panel by RT-PCR (Flu A&B, Covid) Nasopharyngeal Swab     Status: None  ? Collection Time: 12/24/21  4:00 AM  ? Specimen: Nasopharyngeal Swab; Nasopharyngeal(NP) swabs in vial transport medium  ?Result Value Ref Range Status  ? SARS Coronavirus 2 by RT PCR NEGATIVE NEGATIVE Final  ?  Comment: (NOTE) ?SARS-CoV-2 target nucleic acids are NOT DETECTED. ? ?The SARS-CoV-2 RNA is generally detectable in upper respiratory ?specimens during the acute phase of infection. The lowest ?concentration of SARS-CoV-2 viral copies this assay can detect is ?138 copies/mL. A negative result does not preclude SARS-Cov-2 ?infection and  should not be used as the sole basis for treatment or ?other patient management decisions. A negative result may occur with  ?improper specimen collection/handling, submission of specimen other ?than nasopharyngeal swab, presence of viral mutation(s) within the ?areas targeted by this assay, and inadequate number of viral ?copies(<138 copies/mL). A negative result must be combined with ?clinical observations, patient history, and epidemiological ?information. The expected result is Negative. ? ?Fact Sheet for Patients:  ?BloggerCourse.comhttps://www.fda.gov/media/152166/download ? ?Fact Sheet for Healthcare Providers:  ?SeriousBroker.ithttps://www.fda.gov/media/152162/download ? ?This test is no t yet approved or cleared by the [place] FDA and  ?has been authorized for detection and/or diagnosis of SARS-CoV-2 by ?FDA under an Emergency Use Authorization (EUA). This EUA will remain  ?in effect (meaning this test can be used) for the duration of the ?COVID-19 declaration under Section 564(b)(1) of the Act, 21 ?U.S.C.section 360bbb-3(b)(1), unless the authorization is terminated  ?or revoked sooner.  ? ? ?  ? Influenza A by PCR NEGATIVE NEGATIVE Final  ? Influenza B by PCR NEGATIVE NEGATIVE Final  ?  Comment: (NOTE) ?The Xpert Xpress SARS-CoV-2/FLU/RSV plus assay is intended as an aid ?in the diagnosis of influenza from  Nasopharyngeal swab specimens and ?should not be used as a sole basis for treatment. Nasal washings and ?aspirates are unacceptable for Xpert Xpress SARS-CoV-2/FLU/RSV ?testing. ? ?Fact Sheet for Patients: ?https://www.fda.g

## 2021-12-29 NOTE — Plan of Care (Signed)
  Problem: Health Behavior/Discharge Planning: Goal: Ability to manage health-related needs will improve Outcome: Progressing   Problem: Activity: Goal: Risk for activity intolerance will decrease Outcome: Progressing   Problem: Skin Integrity: Goal: Risk for impaired skin integrity will decrease Outcome: Progressing   

## 2021-12-30 DIAGNOSIS — S72002A Fracture of unspecified part of neck of left femur, initial encounter for closed fracture: Secondary | ICD-10-CM | POA: Diagnosis not present

## 2021-12-30 LAB — CBC
HCT: 43 % (ref 39.0–52.0)
Hemoglobin: 14 g/dL (ref 13.0–17.0)
MCH: 31.4 pg (ref 26.0–34.0)
MCHC: 32.6 g/dL (ref 30.0–36.0)
MCV: 96.4 fL (ref 80.0–100.0)
Platelets: 191 10*3/uL (ref 150–400)
RBC: 4.46 MIL/uL (ref 4.22–5.81)
RDW: 12.9 % (ref 11.5–15.5)
WBC: 9.4 10*3/uL (ref 4.0–10.5)
nRBC: 0 % (ref 0.0–0.2)

## 2021-12-30 LAB — BASIC METABOLIC PANEL
Anion gap: 6 (ref 5–15)
BUN: 26 mg/dL — ABNORMAL HIGH (ref 8–23)
CO2: 35 mmol/L — ABNORMAL HIGH (ref 22–32)
Calcium: 8.7 mg/dL — ABNORMAL LOW (ref 8.9–10.3)
Chloride: 97 mmol/L — ABNORMAL LOW (ref 98–111)
Creatinine, Ser: 0.85 mg/dL (ref 0.61–1.24)
GFR, Estimated: >60 mL/min (ref >60)
Glucose, Bld: 112 mg/dL — ABNORMAL HIGH (ref 70–99)
Potassium: 4.4 mmol/L (ref 3.5–5.1)
Sodium: 138 mmol/L (ref 135–145)

## 2021-12-30 NOTE — TOC Progression Note (Signed)
Transition of Care (TOC) - Progression Note  ? ? ?Patient Details  ?Name: Hector Beltran ?MRN: [id number] ?Date of Birth: [birth year] ? ?Transition of Care (TOC) CM/SW Contact  ?Lanier Clam, RN ?Phone Number: ?12/30/2021, 3:25 PM ? ?Clinical Narrative:   awaiting auth for [name] #[id number]. ? ? ? ?Expected Discharge Plan: Skilled Nursing Facility ?Barriers to Discharge: Insurance Authorization ? ?Expected Discharge Plan and Services ?Expected Discharge Plan: Skilled Nursing Facility ?  ?Discharge Planning Services: CM Consult ?Post Acute Care Choice: Skilled Nursing Facility ?  ?                ?  ?  ?  ?  ?  ?  ?  ?  ?  ?  ? ? ?Social Determinants of Health (SDOH) Interventions ?  ? ?Readmission Risk Interventions ?   ? View : No data to display.  ?  ?  ?  ? ? ?

## 2021-12-30 NOTE — Progress Notes (Signed)
Physical Therapy Treatment ?Patient Details ?Name: Hector Beltran ?MRN: [id number] ?DOB: September 08, 1928 ?Today's Date: 12/30/2021 ? ? ?History of Present Illness Patient is 88 y.o. male s/p Lt hemiarthroplasty on 12/24/21 due to Lt femoral neck fracture after fall in bathroom at ALF. PMH significant for COVID in 9/22, neuropathy, SVT. ? ?  ?PT Comments  ? ? POD # 6 ?General Comments: AxO x 3 able to recall his fall, aware he had surgery "last Friday" and following all commands.  Did require repeat instruction on his THP. Pt lives at [facility] and was amb Indep with his walker.  Assisted OOB to amb required increased effort and increased time.  General transfer comment: 75% VC's to avoid hip flex >90 degrees.  Increased assist from elevated surface.  Poor balance.  Increased fear/anxiety of falling.  Mild tremors.  Max Assist to control desend to recliner and to avoid hip flex > 90 degrees.  HIGH FALL RISK. General Gait Details: Very unsteady gait with poor flexed posture and decreased weight shift to L LE.  Avg RA with amb was 90% and HR 68.  RR 28. Distance limited by fatigue/effort.   HIGH FALL RISK. ?Pt resides at ALF and was Indep amb with a walker.  Pt will need ST Rehab at SNF prior to safely returning. ? ?SATURATION QUALIFICATIONS: (This note is used to comply with regulatory documentation for home oxygen) ? ?Patient Saturations on Room Air at Rest =88% ? ?Patient Saturations on Room Air while Ambulating = 90% ? ?Please briefly explain why patient needs home oxygen: pt's RA sats increased with activity.  Encouraged spirometer use.  Pt may need supplemental oxygen at rest/sleep. ?  ?Recommendations for follow up therapy are one component of a multi-disciplinary discharge planning process, led by the attending physician.  Recommendations may be updated based on patient status, additional functional criteria and insurance authorization. ? ?Follow Up Recommendations ? Skilled nursing-short term rehab (<3  hours/day) ?  ?  ?Assistance Recommended at Discharge Frequent or constant Supervision/Assistance  ?Patient can return home with the following Two people to help with walking and/or transfers;Two people to help with bathing/dressing/bathroom;Assistance with cooking/housework;Direct supervision/assist for medications management;Assist for transportation;Help with stairs or ramp for entrance ?  ?Equipment Recommendations ? None recommended by PT  ?  ?Recommendations for Other Services   ? ? ?  ?Precautions / Restrictions Precautions ?Precautions: Fall;Posterior Hip ?Precaution Comments: monitor O2; reviewed posterior hip precautions. pt is able to recall 1/3 ?Restrictions ?Weight Bearing Restrictions: No ?Other Position/Activity Restrictions: WBAT  ?  ? ?Mobility ? Bed Mobility ?Overal bed mobility: Needs Assistance ?Bed Mobility: Supine to Sit ?  ?  ?Supine to sit: Mod assist ?  ?  ?General bed mobility comments: Mod Asisst to guide L LE to avoid THP and increased time. ?  ? ?Transfers ?Overall transfer level: Needs assistance ?Equipment used: Rolling walker (2 wheels) ?Transfers: Sit to/from Stand ?Sit to Stand: Mod assist ?  ?  ?  ?  ?  ?General transfer comment: 75% VC's to avoid hip flex >90 degrees.  Increased assist from elevated surface.  Poor balance.  Increased fear/anxiety of falling.  Mild tremors.  Max Assist to control desend to recliner and to avoid hip flex > 90 degrees.  HIGH FALL RISK. ?  ? ?Ambulation/Gait ?Ambulation/Gait assistance: Mod assist, Max assist ?Gait Distance (Feet): 9 Feet ?Assistive device: Rolling walker (2 wheels) ?Gait Pattern/deviations: Step-to pattern, Decreased stance time - left, Trunk flexed ?Gait velocity: decr ?  ?  ?  General Gait Details: Very unsteady gait with poor flexed posture and decreased weight shift to L LE.  Avg RA with amb was 90% and HR 68.  RR 28. Distance limited by fatigue/effort.   HIGH FALL RISK. ? ? ?Stairs ?  ?  ?  ?  ?  ? ? ?Wheelchair Mobility ?   ? ?Modified Rankin (Stroke Patients Only) ?  ? ? ?  ?Balance   ?  ?  ?  ?  ?  ?  ?  ?  ?  ?  ?  ?  ?  ?  ?  ?  ?  ?  ?  ? ?  ?Cognition Arousal/Alertness: Awake/alert ?Behavior During Therapy: [facility] for tasks assessed/performed ?  ?  ?  ?  ?  ?  ?  ?  ?  ?  ?  ?  ?  ?  ?  ?  ?  ?General Comments: AxO x 3 able to recall his fall, aware he had surgery "last Friday" and following all commands.  Did require repeat instruction on his THP. ?  ?  ? ?  ?Exercises   ? ?  ?General Comments   ?  ?  ? ?Pertinent Vitals/Pain Pain Assessment ?Pain Assessment: Faces ?Faces Pain Scale: Hurts a little bit ?Pain Location: L hip  "not bad" ?Pain Descriptors / Indicators: Discomfort, Operative site guarding ?Pain Intervention(s): Monitored during session, Repositioned  ? ? ?Home Living   ?  ?  ?  ?  ?  ?  ?  ?  ?  ?   ?  ?Prior Function    ?  ?  ?   ? ?PT Goals (current goals can now be found in the care plan section) Progress towards PT goals: Progressing toward goals ? ?  ?Frequency ? ? ? Min 3X/week ? ? ? ?  ?PT Plan Current plan remains appropriate  ? ? ?Co-evaluation   ?  ?  ?  ?  ? ?  ?AM-PAC PT "6 Clicks" Mobility   ?Outcome Measure ? Help needed turning from your back to your side while in a flat bed without using bedrails?: A Lot ?Help needed moving from lying on your back to sitting on the side of a flat bed without using bedrails?: A Lot ?Help needed moving to and from a bed to a chair (including a wheelchair)?: A Lot ?Help needed standing up from a chair using your arms (e.g., wheelchair or bedside chair)?: A Lot ?Help needed to walk in hospital room?: A Lot ?Help needed climbing 3-5 steps with a railing? : Total ?6 Click Score: 11 ? ?  ?End of Session Equipment Utilized During Treatment: Gait belt ?Activity Tolerance: Patient tolerated treatment well;Patient limited by fatigue ?Patient left: in chair;with call bell/phone within reach;with chair alarm set ?Nurse Communication: Mobility status ?PT Visit Diagnosis:  Unsteadiness on feet (R26.81);Muscle weakness (generalized) (M62.81);History of falling (Z91.81);Difficulty in walking, not elsewhere classified (R26.2);Pain ?Pain - Right/Left: Left ?Pain - part of body: Hip ?  ? ? ?Time: [id number] ?PT Time Calculation (min) (ACUTE ONLY): 25 min ? ?Charges:  $Gait Training: 8-22 mins ?$Therapeutic Activity: 8-22 mins          ?          ?Rica Koyanagi  PTA ?Acute  Rehabilitation Services ?Pager      [phone] ?Office      [phone] ? ?

## 2021-12-30 NOTE — Progress Notes (Signed)
?[facility] Progress Note ? ?Patient: Hector Beltran     ?[id number]: 12/24/2021   ?PCP: Seward Carol, MD  ? ?  ?  ?Brief hospital course: ?88-year-old male with diastolic heart failure, COPD, tobacco abuse who presents to the hospital after a fall while in the bathroom.  In the ED he was found to have a left hip fracture and underwent a hemiarthroplasty on 4/11. ? ?Subjective:  ?Has no complaints today.  We discussed his low oxygen levels and he states that at baseline his oxygen level is around 91 to 92%. ?Assessment and Plan: ?Left hip fracture ?- Status post left arthroplasty on 4/11 and has had an uncomplicated postop course ?-On aspirin 325 mg daily for DVT prophylaxis and Norco for pain relief ?- Awaiting skilled nursing facility placement ? ?COPD-nicotine abuse ?- Chronically low pulse ox-90% at rest and 88% when ambulating ?- No wheezing currently-respiratory status quite stable ?- Encouraged him to cut back on cigarettes ? ?Chronic diastolic heart failure ?- He is receiving 40 mg of Lasix daily which can be continued ? ? ? ? ?DVT prophylaxis:  SCDs Start: 12/24/21 2031 ?SCDs Start: 12/24/21 1915 ?SCDs Start: 12/24/21 0435 ?  ?  Code Status: DNR  ?Level of Care: Level of care: Progressive ?Disposition Plan:  ?Status is: Inpatient ?Remains inpatient appropriate because: awaiting SNF ? ?Objective: ?  ?Vitals:  ? 12/29/21 2009 12/30/21 0500 12/30/21 0605 12/30/21 1251  ?BP: (!) 126/55  (!) 130/93 126/66  ?Pulse: 90  61 (!) 102  ?Resp: (!) 22  (!) 23 19  ?Temp: 97.8 ?F (36.6 ?C)  97.6 ?F (36.4 ?C) 98 ?F (36.7 ?C)  ?TempSrc: Oral  Oral Oral  ?SpO2: 94%  91% 96%  ?Weight:  86.9 kg    ?Height:      ? ?Filed Weights  ? 12/28/21 0412 12/29/21 0500 12/30/21 0500  ?Weight: 89.5 kg 89 kg 86.9 kg  ? ?Exam: ?General exam: Appears comfortable  ?HEENT: PERRLA, oral mucosa moist, no sclera icterus or thrush ?Respiratory system: Clear to auscultation. Respiratory effort normal. ?Cardiovascular system: S1 &  S2 heard, regular rate and rhythm ?Gastrointestinal system: Abdomen soft, non-tender, nondistended. Normal bowel sounds   ?Central nervous system: Alert and oriented. No focal neurological deficits. ?Extremities: No cyanosis, clubbing or edema ?Skin: No rashes or ulcers ?Psychiatry:  Mood & affect appropriate.   ? ?Imaging and lab data was personally reviewed ? ? ? CBC: ?Recent Labs  ?Lab 12/24/21 ?0350 12/25/21 ?0448 12/26/21 ?0507 12/27/21 ?0501 12/29/21 ?0500 12/30/21 ?0457  ?WBC 14.4* 14.4* 11.5* 9.4 9.5 9.4  ?NEUTROABS 12.8*  --   --   --   --   --   ?HGB 15.6 12.9* 14.1 13.0 13.4 14.0  ?HCT 47.7 39.7 43.5 39.6 39.8 43.0  ?MCV 97.3 98.3 98.9 98.0 97.3 96.4  ?PLT 186 140* 143* 144* 179 191  ? ?Basic Metabolic Panel: ?Recent Labs  ?Lab 12/25/21 ?0448 12/26/21 ?0507 12/27/21 ?0501 12/29/21 ?0500 12/30/21 ?0457  ?NA 135 138 138 139 138  ?K 5.0 3.9 4.3 4.0 4.4  ?CL 100 98 96* 98 97*  ?CO2 32 35* 38* 35* 35*  ?GLUCOSE 132* 100* 110* 106* 112*  ?BUN 20 26* 20 21 26*  ?CREATININE 0.92 1.01 0.79 0.74 0.85  ?CALCIUM 8.2* 8.3* 8.4* 8.4* 8.7*  ? ?GFR: ?Estimated Creatinine Clearance: 66.3 mL/min (by C-G formula based on SCr of 0.85 mg/dL). ? ?Scheduled Meds: ? aspirin EC  325 mg Oral Q breakfast  ? docusate  sodium  100 mg Oral BID  ? furosemide  40 mg Oral Daily  ? ?Continuous Infusions: ? ? LOS: 6 days  ? ?Author: ?Debbe Odea  ?12/30/2021 4:01 PM ?   ?

## 2021-12-30 NOTE — Plan of Care (Signed)
  Problem: Skin Integrity: Goal: Risk for impaired skin integrity will decrease Outcome: Progressing   

## 2021-12-31 DIAGNOSIS — L89620 Pressure ulcer of left heel, unstageable: Secondary | ICD-10-CM | POA: Diagnosis not present

## 2021-12-31 DIAGNOSIS — S72002D Fracture of unspecified part of neck of left femur, subsequent encounter for closed fracture with routine healing: Secondary | ICD-10-CM | POA: Diagnosis not present

## 2021-12-31 DIAGNOSIS — Z96642 Presence of left artificial hip joint: Secondary | ICD-10-CM | POA: Diagnosis not present

## 2021-12-31 DIAGNOSIS — I509 Heart failure, unspecified: Secondary | ICD-10-CM | POA: Diagnosis not present

## 2021-12-31 DIAGNOSIS — C3411 Malignant neoplasm of upper lobe, right bronchus or lung: Secondary | ICD-10-CM | POA: Diagnosis not present

## 2021-12-31 DIAGNOSIS — R5383 Other fatigue: Secondary | ICD-10-CM | POA: Diagnosis not present

## 2021-12-31 DIAGNOSIS — S72002A Fracture of unspecified part of neck of left femur, initial encounter for closed fracture: Secondary | ICD-10-CM | POA: Diagnosis not present

## 2021-12-31 DIAGNOSIS — Z4789 Encounter for other orthopedic aftercare: Secondary | ICD-10-CM | POA: Diagnosis not present

## 2021-12-31 DIAGNOSIS — J449 Chronic obstructive pulmonary disease, unspecified: Secondary | ICD-10-CM | POA: Diagnosis not present

## 2021-12-31 DIAGNOSIS — R627 Adult failure to thrive: Secondary | ICD-10-CM | POA: Diagnosis not present

## 2021-12-31 DIAGNOSIS — I471 Supraventricular tachycardia: Secondary | ICD-10-CM | POA: Diagnosis not present

## 2021-12-31 DIAGNOSIS — C4331 Malignant melanoma of nose: Secondary | ICD-10-CM | POA: Diagnosis not present

## 2021-12-31 DIAGNOSIS — G629 Polyneuropathy, unspecified: Secondary | ICD-10-CM | POA: Diagnosis not present

## 2021-12-31 DIAGNOSIS — Z7401 Bed confinement status: Secondary | ICD-10-CM | POA: Diagnosis not present

## 2021-12-31 DIAGNOSIS — J029 Acute pharyngitis, unspecified: Secondary | ICD-10-CM | POA: Diagnosis not present

## 2021-12-31 DIAGNOSIS — F319 Bipolar disorder, unspecified: Secondary | ICD-10-CM | POA: Diagnosis not present

## 2021-12-31 DIAGNOSIS — I10 Essential (primary) hypertension: Secondary | ICD-10-CM | POA: Diagnosis not present

## 2021-12-31 DIAGNOSIS — R531 Weakness: Secondary | ICD-10-CM | POA: Diagnosis not present

## 2021-12-31 DIAGNOSIS — I5032 Chronic diastolic (congestive) heart failure: Secondary | ICD-10-CM | POA: Diagnosis not present

## 2021-12-31 LAB — BASIC METABOLIC PANEL
Anion gap: 6 (ref 5–15)
BUN: 26 mg/dL — ABNORMAL HIGH (ref 8–23)
CO2: 32 mmol/L (ref 22–32)
Calcium: 8.6 mg/dL — ABNORMAL LOW (ref 8.9–10.3)
Chloride: 96 mmol/L — ABNORMAL LOW (ref 98–111)
Creatinine, Ser: 0.89 mg/dL (ref 0.61–1.24)
GFR, Estimated: >60 mL/min (ref >60)
Glucose, Bld: 116 mg/dL — ABNORMAL HIGH (ref 70–99)
Potassium: 4.3 mmol/L (ref 3.5–5.1)
Sodium: 134 mmol/L — ABNORMAL LOW (ref 135–145)

## 2021-12-31 LAB — CBC
HCT: 43.2 % (ref 39.0–52.0)
Hemoglobin: 14.5 g/dL (ref 13.0–17.0)
MCH: 32.6 pg (ref 26.0–34.0)
MCHC: 33.6 g/dL (ref 30.0–36.0)
MCV: 97.1 fL (ref 80.0–100.0)
Platelets: 205 10*3/uL (ref 150–400)
RBC: 4.45 MIL/uL (ref 4.22–5.81)
RDW: 13.1 % (ref 11.5–15.5)
WBC: 9.5 10*3/uL (ref 4.0–10.5)
nRBC: 0 % (ref 0.0–0.2)

## 2021-12-31 MED ORDER — DOCUSATE SODIUM 100 MG PO CAPS
100.0000 mg | ORAL_CAPSULE | Freq: Two times a day (BID) | ORAL | 0 refills | Status: DC
Start: 2021-12-31 — End: 2022-05-16

## 2021-12-31 MED ORDER — FUROSEMIDE 40 MG PO TABS: 40.0000 mg | ORAL_TABLET | Freq: Every day | ORAL | Status: DC

## 2021-12-31 MED ORDER — BISACODYL 10 MG RE SUPP: 10.0000 mg | Freq: Every day | RECTAL | 0 refills | Status: DC | PRN

## 2021-12-31 NOTE — Progress Notes (Signed)
[facility] called to [facility]. Awaiting PTAR for transport to facility. ?

## 2021-12-31 NOTE — Progress Notes (Signed)
Occupational Therapy Treatment ?Patient Details ?Name: Hector Beltran ?MRN: [id number] ?DOB: [birth year] ?Today's Date: 12/31/2021 ? ? ?History of present illness Patient is 88 y.o. male s/p Lt hemiarthroplasty on 12/24/21 due to Lt femoral neck fracture after fall in bathroom at ALF. PMH significant for COVID in 9/22, neuropathy, SVT. ?  ?OT comments ? Patient able to recall 1/3 hip precautions this session. With min G assist/cues for maintaining hip precautions patient able to sit up out of bed with head of bed elevated. Patient needing two attempts, bed height elevated and mod A to power up to standing. Once balance obtained patient needing min A to take few steps to recliner chair, able to recall safe sequencing. Patient needing min A for eccentric control when sitting. Patient then set up assist to perform grooming/hygiene in recliner. Progressing towards acute OT goals.   ? ?Recommendations for follow up therapy are one component of a multi-disciplinary discharge planning process, led by the attending physician.  Recommendations may be updated based on patient status, additional functional criteria and insurance authorization. ?   ?Follow Up Recommendations ? Skilled nursing-short term rehab (<3 hours/day)  ?  ?Assistance Recommended at Discharge Frequent or constant Supervision/Assistance  ?Patient can return home with the following ? A lot of help with walking and/or transfers;A lot of help with bathing/dressing/bathroom;Help with stairs or ramp for entrance;Assistance with cooking/housework ?  ?Equipment Recommendations ? None recommended by OT  ?  ?   ?Precautions / Restrictions Precautions ?Precautions: Fall;Posterior Hip ?Precaution Comments: monitor O2; reviewed posterior hip precautions. pt is able to recall 1/3 ?Restrictions ?Weight Bearing Restrictions: No  ? ? ?  ? ?Mobility Bed Mobility ?Overal bed mobility: Needs Assistance ?Bed Mobility: Supine to Sit ?  ?  ?Supine to sit: Min guard, HOB elevated ?  ?   ?General bed mobility comments: Min cues for maintaining hip precautions, otherwise self able ?  ? ? ?  ?Balance Overall balance assessment: Needs assistance ?Sitting-balance support: Feet supported ?Sitting balance-Leahy Scale: Fair ?  ?  ?Standing balance support: Reliant on assistive device for balance ?Standing balance-Leahy Scale: Poor ?  ?  ?  ?  ?  ?  ?  ?  ?  ?  ?  ?  ?   ? ?ADL either performed or assessed with clinical judgement  ? ?ADL Overall ADL's : Needs assistance/impaired ?  ?  ?Grooming: Oral care;Wash/dry face;Wash/dry hands;Set up;Sitting ?  ?  ?  ?  ?  ?  ?  ?  ?  ?Toilet Transfer: Moderate assistance;Cueing for safety;Stand-pivot;Rolling walker (2 wheels) ?Toilet Transfer Details (indicate cue type and reason): To recliner chair. Mod A to power up to standing from elevated bed height. Min A for steadying while taking steps to recliner ?  ?  ?  ?  ?Functional mobility during ADLs: Moderate assistance;Cueing for safety;Rolling walker (2 wheels) ?  ?  ? ? ? ?Cognition Arousal/Alertness: Awake/alert ?Behavior During Therapy: [facility] for tasks assessed/performed ?Overall Cognitive Status: Within Functional Limits for tasks assessed ?  ?  ?  ?  ?  ?  ?  ?  ?  ?  ?  ?  ?  ?  ?  ?  ?General Comments: Can recall "don't go past 90 degrees" only ?  ?  ?   ?   ?   ?   ? ? ?Pertinent Vitals/ Pain       Pain Assessment ?Pain Assessment: No/denies pain ? ?   ?   ? ?  Frequency ? Min 2X/week  ? ? ? ? ?  ?Progress Toward Goals ? ?OT Goals(current goals can now be found in the care plan section) ? Progress towards OT goals: Progressing toward goals ? ?Acute Rehab OT Goals ?Patient Stated Goal: Get out of bed ?OT Goal Formulation: With patient ?Time For Goal Achievement: 01/08/22 ?Potential to Achieve Goals: Good ?ADL Goals ?Pt Will Perform Lower Body Dressing: with min assist;with adaptive equipment;sit to/from stand ?Pt Will Transfer to Toilet: with min assist;ambulating;regular height toilet;grab bars;bedside  commode ?Pt Will Perform Toileting - Clothing Manipulation and hygiene: with min assist;sit to/from stand ?Additional ADL Goal #1: Patient will stand at sink to perform grooming task as evidence of improving activity tolerance  ?Plan Discharge plan remains appropriate   ? ?   ?AM-PAC OT "6 Clicks" Daily Activity     ?Outcome Measure ? ? Help from another person eating meals?: None ?Help from another person taking care of personal grooming?: A Little ?Help from another person toileting, which includes using toliet, bedpan, or urinal?: Total ?Help from another person bathing (including washing, rinsing, drying)?: A Lot ?Help from another person to put on and taking off regular upper body clothing?: A Little ?Help from another person to put on and taking off regular lower body clothing?: Total ?6 Click Score: 14 ? ?  ?End of Session Equipment Utilized During Treatment: Rolling walker (2 wheels);Gait belt ? ?OT Visit Diagnosis: Other abnormalities of gait and mobility (R26.89) ?  ?Activity Tolerance Patient tolerated treatment well ?  ?Patient Left in chair;with call bell/phone within reach;with chair alarm set ?  ?Nurse Communication Mobility status ?  ? ?   ? ?Time: 8315-1761 ?OT Time Calculation (min): 19 min ? ?Charges: OT General Charges ?$OT Visit: 1 Visit ?OT Treatments ?$Self Care/Home Management : 8-22 mins ? ?Marlyce Huge OT ?OT pager: [phone] ? ? ?Carmelia Roller ?12/31/2021, 12:53 PM ?

## 2021-12-31 NOTE — Plan of Care (Signed)
  Problem: Clinical Measurements: Goal: Respiratory complications will improve Outcome: Progressing Goal: Cardiovascular complication will be avoided Outcome: Progressing   Problem: Activity: Goal: Risk for activity intolerance will decrease Outcome: Progressing   Problem: Skin Integrity: Goal: Risk for impaired skin integrity will decrease Outcome: Progressing   

## 2021-12-31 NOTE — Progress Notes (Signed)
Nutrition Follow-up ? ?DOCUMENTATION CODES:  ? ?Not applicable ? ?INTERVENTION:  ?- continue to encourage intakes of meals. ?- will monitor for possible needs prior to d/c. ? ?NUTRITION DIAGNOSIS:  ? ?Increased nutrient needs related to hip fracture, post-op healing as evidenced by estimated needs. -ongoing ? ?GOAL:  ? ?Patient will meet greater than or equal to 90% of their needs -met on average ? ?MONITOR:  ? ?PO intake, Labs, Weight trends ? ?ASSESSMENT:  ? ?88 y.o. male with medical history of CHF, peripheral neuropathy, and SVT. He presented to the ED due to L hip pain after a fall. In the ED, imaging showed L hip fracture and he underwent L hip hemiarthroplasty on 4/11. ? ?Patient working with therapy at the time of first attempted visit and on the phone at the time of second attempted visit.  ? ?Patient has been eating 100% at meals since lunch on 4/13 including 100% of breakfast this AM.  ? ?Weight has been trending down (-4 lb since 4/11) with lasix order in place. Non-pitting edema to BLE documented in the edema section of flow sheet.  ? ?Per MD note yesterday: pending SNF placement. ? ? ?Labs reviewed; Na: 134 mmol/l, Cl: 96 mmol/l, BUN: 26 mg/dl, Ca: 8.6 mg/dl. ?Medications reviewed; 40 mg oral lasix/day. ? ? ?Diet Order:   ?Diet Order   ? ?       ?  Diet Heart Room service appropriate? Yes; Fluid consistency: Thin  Diet effective now       ?  ? ?  ?  ? ?  ? ? ?EDUCATION NEEDS:  ? ?No education needs have been identified at this time ? ?Skin:  Skin Assessment: Skin Integrity Issues: ?Skin Integrity Issues:: Incisions ?Incisions: L hip (4/11) ? ?Last BM:  4/17 (type 4 x1, large amount) ? ?Height:  ? ?Ht Readings from Last 1 Encounters:  ?12/24/21 '6\' 3"'  (1.905 m)  ? ? ?Weight:  ? ?Wt Readings from Last 1 Encounters:  ?12/31/21 86.9 kg  ? ? ?BMI:  Body mass index is 23.95 kg/m?. ? ?Estimated Nutritional Needs:  ?Kcal:  1900-2150 kcal ?Protein:  95-110 grams ?Fluid:  >/= 2.5 L/day ? ? ? ? ?Jarome Matin,  MS, RD, LDN ?Registered Dietitian II ?Inpatient Clinical Nutrition ?RD pager # and on-call/weekend pager # available in [place]  ? ?

## 2021-12-31 NOTE — TOC Transition Note (Signed)
Transition of Care (TOC) - CM/SW Discharge Note ? ? ?Patient Details  ?Name: Hector Beltran ?MRN: [id number] ?Date of Birth: [birth year] ? ?[facility]) CM/SW Contact:  ?Lanier Clam, RN ?Phone Number: ?12/31/2021, 2:44 PM ? ? ?Clinical Narrative: Received auth to [name] rm#[id number] cal report [phone]. [facility] called. No further CM needs.   ? ? ? ?Final next level of care: Skilled Nursing Facility ?Barriers to Discharge: No Barriers Identified ? ? ?Patient Goals and CMS Choice ?Patient states their goals for this hospitalization and ongoing recovery are:: Rehab ?CMS Medicare.gov Compare Post Acute Care list provided to:: Patient ?Choice offered to / list presented to : Patient ? ?Discharge Placement ?PASRR number recieved: 12/30/21 ?           ?[name] ?Patient to be transferred to facility by: [facility] ?Name of family member notified: [name] ?Patient and family notified of of transfer: 12/31/21 ? ?Discharge Plan and Services ?  ?Discharge Planning Services: CM Consult ?Post Acute Care Choice: Skilled Nursing Facility          ?  ?  ?  ?  ?  ?  ?  ?  ?  ?  ? ?Social Determinants of Health (SDOH) Interventions ?  ? ? ?Readmission Risk Interventions ?   ? View : No data to display.  ?  ?  ?  ? ? ? ? ? ?

## 2021-12-31 NOTE — Discharge Summary (Addendum)
Physician Discharge Summary  ?Hector Beltran [id number] DOB: [birth year] DOA: 12/24/2021 ? ?PCP: Seward Carol, MD ? ?Admit date: 12/24/2021 ?Discharge date: 12/31/2021 ?Discharging to: SNF ?Recommendations for Outpatient Follow-up:  ?Encourage smoking cessation ?Bmet in 1 wk ?Daily weights ? ?Consults:  ?Orthopedic surgery ?Procedures:  ?Left hip arthroplasty ? ? ?Discharge Diagnoses:  ? Principal Problem: ?  Closed left hip fracture ([place]) ?Active Problems: ?  COPD (chronic obstructive pulmonary disease) ([place]) ?  Nicotine dependence, cigarettes, uncomplicated ?  Peripheral neuropathy ?  Chronic diastolic HF (heart failure) ([place]) ?  Fall at home, initial encounter ? ? ? ? ? ?Hospital Course: ? 88-year-old male with diastolic heart failure, COPD, tobacco abuse who presents to the hospital after a fall while in the bathroom.  In the ED he was found to have a left hip fracture and underwent a hemiarthroplasty on 4/11 ? ?Assessment and Plan: ? Left hip fracture after a fall ?- Status post left arthroplasty on 4/11 and has had an uncomplicated postop course ?-On aspirin 325 mg daily for DVT prophylaxis and Norco for pain relief ?- Awaiting skilled nursing facility placement ?  ?COPD-nicotine abuse ?- Chronically low pulse ox-90% at rest and 88% when ambulating ?- No wheezing currently-respiratory status quite stable ?- Encouraged him to cut back on cigarette smoking smoking ? ?Chronic diastolic heart failure ?- He is receiving 40 mg of Lasix daily which can be continued ?  ? ?Discharge Instructions ? ?Discharge Instructions   ? ? Diet - low sodium heart healthy   Complete by: As directed ?  ? Increase activity slowly   Complete by: As directed ?  ? No wound care   Complete by: As directed ?  ? ?  ? ?Allergies as of 12/31/2021   ?No Known Allergies ?  ? ?  ?Medication List  ?  ? ?TAKE these medications   ? ?aspirin 325 MG EC tablet ?Take 1 tablet (325 mg total) by mouth daily with breakfast. ?  ?B COMPLEX PO ?Take 1 tablet by  mouth daily. ?  ?bisacodyl 10 MG suppository ?Commonly known as: DULCOLAX ?Place 1 suppository (10 mg total) rectally daily as needed for moderate constipation. ?  ?CO Q-10 PO ?Take 1 capsule by mouth daily. ?  ?docusate sodium 100 MG capsule ?Commonly known as: COLACE ?Take 1 capsule (100 mg total) by mouth 2 (two) times daily. ?  ?furosemide 40 MG tablet ?Commonly known as: LASIX ?Take 1 tablet (40 mg total) by mouth daily. ?Start taking on: January 01, 2022 ?  ?HYDROcodone-acetaminophen 5-325 MG tablet ?Commonly known as: NORCO/VICODIN ?Take 1-2 tablets by mouth every 4 (four) hours as needed for moderate pain (pain score 4-6). ?  ?OMEGA 3 PO ?Take 1 capsule by mouth daily. ?  ? ?  ? ? Follow-up Information   ? ? [name], MD Follow up in 2 week(s).   ?Specialty: Orthopedic Surgery ?Contact information: ?[address] ?[place] [address] ?[phone] ? ? ?  ?  ? ?  ?  ? ?  ? ?  ?  ?The results of significant diagnostics from this hospitalization (including imaging, microbiology, ancillary and laboratory) are listed below for reference.   ? ?CT Angio Chest Pulmonary Embolism (PE) W or WO Contrast ? ?Result Date: 12/25/2021 ?CLINICAL DATA:  Post fall, positive D-dimer, suspected pulmonary embolism EXAM: CT ANGIOGRAPHY CHEST WITH CONTRAST TECHNIQUE: Multidetector CT imaging of the chest was performed using the standard protocol during bolus administration of intravenous contrast. Multiplanar CT image  reconstructions and MIPs were obtained to evaluate the vascular anatomy. RADIATION DOSE REDUCTION: This exam was performed according to the departmental dose-optimization program which includes automated exposure control, adjustment of the mA and/or kV according to patient size and/or use of iterative reconstruction technique. CONTRAST:  70mL OMNIPAQUE IOHEXOL 350 MG/ML SOLN COMPARISON:  09/25/2021 and previous FINDINGS: Cardiovascular: Mild predominately right-sided cardiomegaly. Dilated central pulmonary  arteries suggesting pulmonary hypertension. Satisfactory opacification of pulmonary arteries noted, and there is no evidence of pulmonary emboli. Moderate coronary calcifications. Adequate contrast opacification of the thoracic aorta with no evidence of dissection, aneurysm, or stenosis. There is classic 3-vessel brachiocephalic arch anatomy without proximal stenosis. Scattered calcified atheromatous plaque in the arch and descending thoracic segment as well as in the visualized proximal abdominal aorta which is nondilated. Mediastinum/Nodes: No mediastinal hematoma, mass or adenopathy. Lungs/Pleura: Small pleural effusions, new since previous. Dependent atelectasis posteriorly in the lung bases. Lungs otherwise clear. Upper Abdomen: Multiple small hepatic cystic lesions as before, none require follow-up. Left renal cysts as before, do not require follow-up. No acute findings. Musculoskeletal: Spondylitic changes in the lower cervical spine. Anterior vertebral endplate spurring at multiple levels in the mid and lower thoracic spine, and visualized upper lumbar spine. Review of the MIP images confirms the above findings. IMPRESSION: 1. Negative for acute PE or thoracic aortic dissection. 2. Dilated central pulmonary arteries, suggesting pulmonary hypertension 3. New small bilateral pleural effusions 4. Coronary and Aortic Atherosclerosis (ICD10-170.0). Electronically Signed   By: Lucrezia Europe M.D.   On: 12/25/2021 16:51  ? ?Pelvis Portable ? ?Result Date: 12/24/2021 ?CLINICAL DATA:  Status post left hip arthroplasty. EXAM: PORTABLE PELVIS 1-2 VIEWS COMPARISON:  Pelvis and right hip radiographs 01/22/2016, pelvis and left hip radiographs 12/24/2021 FINDINGS: Interval total left hip arthroplasty. No perihardware lucency is seen to indicate hardware failure or loosening. Expected postoperative changes including left hip subcutaneous air and soft tissue swelling. Superolateral left hip surgical skin staples. Moderate right  femoroacetabular joint space narrowing. Diffuse decreased bone mineralization. IMPRESSION: Status post total left hip arthroplasty without evidence of hardware failure. Electronically Signed   By: Yvonne Kendall M.D.   On: 12/24/2021 19:47  ? ?DG Chest Port 1 View ? ?Result Date: 12/24/2021 ?CLINICAL DATA:  Preop, hip fracture. EXAM: PORTABLE CHEST 1 VIEW COMPARISON:  09/25/2021. FINDINGS: The heart is enlarged and the mediastinal contour is within normal limits. Atherosclerotic calcification of the aorta is noted. No consolidation, effusion, or pneumothorax. No acute osseous abnormality. IMPRESSION: No active disease. Electronically Signed   By: Brett Fairy M.D.   On: 12/24/2021 04:08  ? ?DG HIP UNILAT WITH PELVIS 2-3 VIEWS LEFT ? ?Result Date: 12/24/2021 ?CLINICAL DATA:  Fall, landed on left side with left hip pain. EXAM: DG HIP (WITH OR WITHOUT PELVIS) 2-3V LEFT; LEFT FEMUR 2 VIEWS COMPARISON:  01/22/2016. FINDINGS: There is a subcapital impaction fracture of the proximal left femur with mild superior subluxation of the distal fracture fragment. No dislocation. Degenerative changes are present at the hips bilaterally, left knee, and in the lower lumbar spine. A trace suprapatellar joint effusion is noted on the right. Vascular calcifications are noted in the soft tissues. IMPRESSION: Subcapital impaction fracture of the proximal left femur. Electronically Signed   By: Brett Fairy M.D.   On: 12/24/2021 03:50  ? ?DG FEMUR MIN 2 VIEWS LEFT ? ?Result Date: 12/24/2021 ?CLINICAL DATA:  Fall, landed on left side with left hip pain. EXAM: DG HIP (WITH OR WITHOUT PELVIS) 2-3V LEFT; LEFT FEMUR  2 VIEWS COMPARISON:  01/22/2016. FINDINGS: There is a subcapital impaction fracture of the proximal left femur with mild superior subluxation of the distal fracture fragment. No dislocation. Degenerative changes are present at the hips bilaterally, left knee, and in the lower lumbar spine. A trace suprapatellar joint effusion is  noted on the right. Vascular calcifications are noted in the soft tissues. IMPRESSION: Subcapital impaction fracture of the proximal left femur. Electronically Signed   By: Brett Fairy M.D.   On: 12/24/2021

## 2022-01-02 DIAGNOSIS — J449 Chronic obstructive pulmonary disease, unspecified: Secondary | ICD-10-CM | POA: Diagnosis not present

## 2022-01-02 DIAGNOSIS — R627 Adult failure to thrive: Secondary | ICD-10-CM | POA: Diagnosis not present

## 2022-01-02 DIAGNOSIS — S72002A Fracture of unspecified part of neck of left femur, initial encounter for closed fracture: Secondary | ICD-10-CM | POA: Diagnosis not present

## 2022-01-02 DIAGNOSIS — I509 Heart failure, unspecified: Secondary | ICD-10-CM | POA: Diagnosis not present

## 2022-01-10 DIAGNOSIS — J029 Acute pharyngitis, unspecified: Secondary | ICD-10-CM | POA: Diagnosis not present

## 2022-01-10 DIAGNOSIS — I509 Heart failure, unspecified: Secondary | ICD-10-CM | POA: Diagnosis not present

## 2022-01-10 DIAGNOSIS — S72002A Fracture of unspecified part of neck of left femur, initial encounter for closed fracture: Secondary | ICD-10-CM | POA: Diagnosis not present

## 2022-01-10 DIAGNOSIS — J449 Chronic obstructive pulmonary disease, unspecified: Secondary | ICD-10-CM | POA: Diagnosis not present

## 2022-01-13 DIAGNOSIS — L89620 Pressure ulcer of left heel, unstageable: Secondary | ICD-10-CM | POA: Diagnosis not present

## 2022-01-16 ENCOUNTER — Telehealth: Payer: Self-pay

## 2022-01-16 NOTE — Telephone Encounter (Signed)
Please advise 

## 2022-01-16 NOTE — Telephone Encounter (Signed)
Hector Beltran with [name] would like to know if sutures can be removed before F/U on Friday, 01/31/2022?  Patient had left hip surgery on 12/24/2021.  Cb# [phone].  Please advise.  Thank you. ?

## 2022-01-16 NOTE — Telephone Encounter (Signed)
I called Melissa and advised. ?

## 2022-01-20 ENCOUNTER — Telehealth: Payer: Self-pay | Admitting: Orthopaedic Surgery

## 2022-01-20 DIAGNOSIS — L89620 Pressure ulcer of left heel, unstageable: Secondary | ICD-10-CM | POA: Diagnosis not present

## 2022-01-20 NOTE — Telephone Encounter (Signed)
noted 

## 2022-01-20 NOTE — Telephone Encounter (Signed)
Received call from patient's son concerning staple removal. I advised Melissa with [name] has already be contacted concerning the staple removal.    Patient's son advised he will call Melissa with [facility] to see if Staples were removed. Number to contact [name] is [phone] ?

## 2022-01-21 ENCOUNTER — Encounter: Payer: Self-pay | Admitting: Orthopaedic Surgery

## 2022-01-21 ENCOUNTER — Ambulatory Visit (INDEPENDENT_AMBULATORY_CARE_PROVIDER_SITE_OTHER): Payer: Medicare PPO

## 2022-01-21 ENCOUNTER — Ambulatory Visit (INDEPENDENT_AMBULATORY_CARE_PROVIDER_SITE_OTHER): Payer: Medicare PPO | Admitting: Orthopaedic Surgery

## 2022-01-21 VITALS — BP 118/75 | HR 83 | Ht 75.0 in | Wt 191.0 lb

## 2022-01-21 DIAGNOSIS — S72002D Fracture of unspecified part of neck of left femur, subsequent encounter for closed fracture with routine healing: Secondary | ICD-10-CM

## 2022-01-21 DIAGNOSIS — Z96649 Presence of unspecified artificial hip joint: Secondary | ICD-10-CM

## 2022-01-21 NOTE — Progress Notes (Signed)
? ?  Post-Op Visit Note ?  ?Patient: Hector Beltran           ?Date of Birth: Feb 20, 1928           ?MRN: [id number] ?Visit Date: 01/21/2022 ?PCP: Renford Dills, MD ? ? ?Assessment & Plan: Continue ambulation follow-up as needed. ? ?Chief Complaint:  ?Chief Complaint  ?Patient presents with  ? Left Hip - Routine Post Op  ?  12/24/2021 Left hip hemiarthroplasty  ? ?Visit Diagnoses:  ?1. Closed fracture of left hip with routine healing, subsequent encounter   ?2. S/P hip hemiarthroplasty   ? ? ?Plan: Cherlynn Polo are removed and incision and x-rays look good.  He will gradually increase his ambulation with weightbearing as tolerated.  He does have a history of respiratory failure heart failure and COPD. ? ?Follow-Up Instructions: No follow-ups on file.  ? ?Orders:  ?Orders Placed This Encounter  ?Procedures  ? XR HIP UNILAT W OR W/O PELVIS 2-3 VIEWS LEFT  ? ?No orders of the defined types were placed in this encounter. ? ? ?Imaging: ?XR HIP UNILAT W OR W/O PELVIS 2-3 VIEWS LEFT ? ?Result Date: 01/21/2022 ?Three-view x-rays left hip obtained and reviewed.  This shows hemiarthroplasty in satisfactory position no loosening or subsidence staples are noted. Impression: Satisfactory left hip hemiarthroplasty.  ? ?PMFS History: ?Patient Active Problem List  ? Diagnosis Date Noted  ? S/P hip hemiarthroplasty 01/21/2022  ? Closed left hip fracture ([facility]) 12/24/2021  ? Chronic diastolic HF (heart failure) ([facility]) 12/24/2021  ? Fall at home, initial encounter 12/24/2021  ? Leukocytosis 12/24/2021  ? Acute CHF (congestive heart failure) ([facility]) 08/22/2020  ? Cirrhosis of liver ([facility]) 08/22/2020  ? COPD (chronic obstructive pulmonary disease) ([facility]) 08/22/2020  ? Nicotine dependence, cigarettes, uncomplicated 08/22/2020  ? Hyperkalemia 08/22/2020  ? Hepatic cyst 08/22/2020  ? Elevated troponin level not due myocardial infarction 08/22/2020  ? Acute respiratory failure with hypoxia ([facility]) 08/22/2020  ? Gait abnormality 02/09/2020  ? Norovirus  11/22/2018  ? Diarrhea of presumed infectious origin 11/21/2018  ? Peripheral neuropathy 09/30/2017  ? ?Past Medical History:  ?Diagnosis Date  ? Constipation   ? COVID 05/27/2021  ? Gait abnormality 02/09/2020  ? Neuropathy   ? Peripheral neuropathy 09/30/2017  ? SVT (supraventricular tachycardia) ([facility])   ?  ?No family history on file.  ?Past Surgical History:  ?Procedure Laterality Date  ? CYST REMOVAL NECK    ? HIP ARTHROPLASTY Left 12/24/2021  ? Procedure: ARTHROPLASTY BIPOLAR HIP (HEMIARTHROPLASTY);  Surgeon: Eldred Manges, MD;  Location: [facility];  Service: Orthopedics;  Laterality: Left;  ? ?Social History  ? ?Occupational History  ? Occupation: Retired  ?Tobacco Use  ? Smoking status: Former  ?  Types: Cigarettes  ?  Quit date: 08/15/2020  ?  Years since quitting: 1.4  ? Smokeless tobacco: Never  ? Tobacco comments:  ?  2 packs per week, quit 08/2020  ?Vaping Use  ? Vaping Use: Never used  ?Substance and Sexual Activity  ? Alcohol use: Yes  ?  Alcohol/week: 3.0 standard drinks  ?  Types: 1 Glasses of wine, 1 Cans of beer, 1 Shots of liquor per week  ?  Comment: occasionally  ? Drug use: No  ? Sexual activity: Not on file  ? ? ? ?

## 2022-01-22 DIAGNOSIS — I471 Supraventricular tachycardia: Secondary | ICD-10-CM | POA: Diagnosis not present

## 2022-01-22 DIAGNOSIS — I5032 Chronic diastolic (congestive) heart failure: Secondary | ICD-10-CM | POA: Diagnosis not present

## 2022-01-22 DIAGNOSIS — M6281 Muscle weakness (generalized): Secondary | ICD-10-CM | POA: Diagnosis not present

## 2022-01-22 DIAGNOSIS — Z4789 Encounter for other orthopedic aftercare: Secondary | ICD-10-CM | POA: Diagnosis not present

## 2022-01-22 DIAGNOSIS — J449 Chronic obstructive pulmonary disease, unspecified: Secondary | ICD-10-CM | POA: Diagnosis not present

## 2022-01-22 DIAGNOSIS — G629 Polyneuropathy, unspecified: Secondary | ICD-10-CM | POA: Diagnosis not present

## 2022-01-22 DIAGNOSIS — Z96642 Presence of left artificial hip joint: Secondary | ICD-10-CM | POA: Diagnosis not present

## 2022-01-22 DIAGNOSIS — L97509 Non-pressure chronic ulcer of other part of unspecified foot with unspecified severity: Secondary | ICD-10-CM | POA: Diagnosis not present

## 2022-01-22 DIAGNOSIS — S72002D Fracture of unspecified part of neck of left femur, subsequent encounter for closed fracture with routine healing: Secondary | ICD-10-CM | POA: Diagnosis not present

## 2022-01-22 DIAGNOSIS — R269 Unspecified abnormalities of gait and mobility: Secondary | ICD-10-CM | POA: Diagnosis not present

## 2022-01-23 DIAGNOSIS — Z96642 Presence of left artificial hip joint: Secondary | ICD-10-CM | POA: Diagnosis not present

## 2022-01-23 DIAGNOSIS — I471 Supraventricular tachycardia: Secondary | ICD-10-CM | POA: Diagnosis not present

## 2022-01-23 DIAGNOSIS — G629 Polyneuropathy, unspecified: Secondary | ICD-10-CM | POA: Diagnosis not present

## 2022-01-23 DIAGNOSIS — Z4789 Encounter for other orthopedic aftercare: Secondary | ICD-10-CM | POA: Diagnosis not present

## 2022-01-23 DIAGNOSIS — S72002D Fracture of unspecified part of neck of left femur, subsequent encounter for closed fracture with routine healing: Secondary | ICD-10-CM | POA: Diagnosis not present

## 2022-01-23 DIAGNOSIS — J449 Chronic obstructive pulmonary disease, unspecified: Secondary | ICD-10-CM | POA: Diagnosis not present

## 2022-01-23 DIAGNOSIS — R269 Unspecified abnormalities of gait and mobility: Secondary | ICD-10-CM | POA: Diagnosis not present

## 2022-01-23 DIAGNOSIS — M6281 Muscle weakness (generalized): Secondary | ICD-10-CM | POA: Diagnosis not present

## 2022-01-23 DIAGNOSIS — I5032 Chronic diastolic (congestive) heart failure: Secondary | ICD-10-CM | POA: Diagnosis not present

## 2022-01-24 DIAGNOSIS — G629 Polyneuropathy, unspecified: Secondary | ICD-10-CM | POA: Diagnosis not present

## 2022-01-24 DIAGNOSIS — Z96642 Presence of left artificial hip joint: Secondary | ICD-10-CM | POA: Diagnosis not present

## 2022-01-24 DIAGNOSIS — R269 Unspecified abnormalities of gait and mobility: Secondary | ICD-10-CM | POA: Diagnosis not present

## 2022-01-24 DIAGNOSIS — J029 Acute pharyngitis, unspecified: Secondary | ICD-10-CM | POA: Diagnosis not present

## 2022-01-24 DIAGNOSIS — J449 Chronic obstructive pulmonary disease, unspecified: Secondary | ICD-10-CM | POA: Diagnosis not present

## 2022-01-24 DIAGNOSIS — I5032 Chronic diastolic (congestive) heart failure: Secondary | ICD-10-CM | POA: Diagnosis not present

## 2022-01-24 DIAGNOSIS — R627 Adult failure to thrive: Secondary | ICD-10-CM | POA: Diagnosis not present

## 2022-01-24 DIAGNOSIS — S72002D Fracture of unspecified part of neck of left femur, subsequent encounter for closed fracture with routine healing: Secondary | ICD-10-CM | POA: Diagnosis not present

## 2022-01-24 DIAGNOSIS — I471 Supraventricular tachycardia: Secondary | ICD-10-CM | POA: Diagnosis not present

## 2022-01-24 DIAGNOSIS — S72002A Fracture of unspecified part of neck of left femur, initial encounter for closed fracture: Secondary | ICD-10-CM | POA: Diagnosis not present

## 2022-01-24 DIAGNOSIS — M6281 Muscle weakness (generalized): Secondary | ICD-10-CM | POA: Diagnosis not present

## 2022-01-24 DIAGNOSIS — Z4789 Encounter for other orthopedic aftercare: Secondary | ICD-10-CM | POA: Diagnosis not present

## 2022-01-24 DIAGNOSIS — I509 Heart failure, unspecified: Secondary | ICD-10-CM | POA: Diagnosis not present

## 2022-01-25 DIAGNOSIS — M7989 Other specified soft tissue disorders: Secondary | ICD-10-CM | POA: Diagnosis not present

## 2022-01-27 DIAGNOSIS — Z4789 Encounter for other orthopedic aftercare: Secondary | ICD-10-CM | POA: Diagnosis not present

## 2022-01-27 DIAGNOSIS — R269 Unspecified abnormalities of gait and mobility: Secondary | ICD-10-CM | POA: Diagnosis not present

## 2022-01-27 DIAGNOSIS — L89620 Pressure ulcer of left heel, unstageable: Secondary | ICD-10-CM | POA: Diagnosis not present

## 2022-01-27 DIAGNOSIS — I471 Supraventricular tachycardia: Secondary | ICD-10-CM | POA: Diagnosis not present

## 2022-01-27 DIAGNOSIS — I5032 Chronic diastolic (congestive) heart failure: Secondary | ICD-10-CM | POA: Diagnosis not present

## 2022-01-27 DIAGNOSIS — J449 Chronic obstructive pulmonary disease, unspecified: Secondary | ICD-10-CM | POA: Diagnosis not present

## 2022-01-27 DIAGNOSIS — G629 Polyneuropathy, unspecified: Secondary | ICD-10-CM | POA: Diagnosis not present

## 2022-01-27 DIAGNOSIS — Z96642 Presence of left artificial hip joint: Secondary | ICD-10-CM | POA: Diagnosis not present

## 2022-01-27 DIAGNOSIS — S72002D Fracture of unspecified part of neck of left femur, subsequent encounter for closed fracture with routine healing: Secondary | ICD-10-CM | POA: Diagnosis not present

## 2022-01-27 DIAGNOSIS — M6281 Muscle weakness (generalized): Secondary | ICD-10-CM | POA: Diagnosis not present

## 2022-01-28 DIAGNOSIS — Z4789 Encounter for other orthopedic aftercare: Secondary | ICD-10-CM | POA: Diagnosis not present

## 2022-01-28 DIAGNOSIS — I471 Supraventricular tachycardia: Secondary | ICD-10-CM | POA: Diagnosis not present

## 2022-01-28 DIAGNOSIS — J449 Chronic obstructive pulmonary disease, unspecified: Secondary | ICD-10-CM | POA: Diagnosis not present

## 2022-01-28 DIAGNOSIS — M6281 Muscle weakness (generalized): Secondary | ICD-10-CM | POA: Diagnosis not present

## 2022-01-28 DIAGNOSIS — Z96642 Presence of left artificial hip joint: Secondary | ICD-10-CM | POA: Diagnosis not present

## 2022-01-28 DIAGNOSIS — S72002D Fracture of unspecified part of neck of left femur, subsequent encounter for closed fracture with routine healing: Secondary | ICD-10-CM | POA: Diagnosis not present

## 2022-01-28 DIAGNOSIS — G629 Polyneuropathy, unspecified: Secondary | ICD-10-CM | POA: Diagnosis not present

## 2022-01-28 DIAGNOSIS — I5032 Chronic diastolic (congestive) heart failure: Secondary | ICD-10-CM | POA: Diagnosis not present

## 2022-01-28 DIAGNOSIS — R269 Unspecified abnormalities of gait and mobility: Secondary | ICD-10-CM | POA: Diagnosis not present

## 2022-01-29 DIAGNOSIS — I5032 Chronic diastolic (congestive) heart failure: Secondary | ICD-10-CM | POA: Diagnosis not present

## 2022-01-29 DIAGNOSIS — J9601 Acute respiratory failure with hypoxia: Secondary | ICD-10-CM | POA: Diagnosis not present

## 2022-01-29 DIAGNOSIS — M6281 Muscle weakness (generalized): Secondary | ICD-10-CM | POA: Diagnosis not present

## 2022-01-29 DIAGNOSIS — J449 Chronic obstructive pulmonary disease, unspecified: Secondary | ICD-10-CM | POA: Diagnosis not present

## 2022-01-29 DIAGNOSIS — I509 Heart failure, unspecified: Secondary | ICD-10-CM | POA: Diagnosis not present

## 2022-01-29 DIAGNOSIS — Z96642 Presence of left artificial hip joint: Secondary | ICD-10-CM | POA: Diagnosis not present

## 2022-01-29 DIAGNOSIS — R269 Unspecified abnormalities of gait and mobility: Secondary | ICD-10-CM | POA: Diagnosis not present

## 2022-01-29 DIAGNOSIS — S91302D Unspecified open wound, left foot, subsequent encounter: Secondary | ICD-10-CM | POA: Diagnosis not present

## 2022-01-29 DIAGNOSIS — I471 Supraventricular tachycardia: Secondary | ICD-10-CM | POA: Diagnosis not present

## 2022-01-29 DIAGNOSIS — S72002D Fracture of unspecified part of neck of left femur, subsequent encounter for closed fracture with routine healing: Secondary | ICD-10-CM | POA: Diagnosis not present

## 2022-01-29 DIAGNOSIS — G629 Polyneuropathy, unspecified: Secondary | ICD-10-CM | POA: Diagnosis not present

## 2022-01-29 DIAGNOSIS — Z4789 Encounter for other orthopedic aftercare: Secondary | ICD-10-CM | POA: Diagnosis not present

## 2022-01-30 DIAGNOSIS — Z4789 Encounter for other orthopedic aftercare: Secondary | ICD-10-CM | POA: Diagnosis not present

## 2022-01-30 DIAGNOSIS — J449 Chronic obstructive pulmonary disease, unspecified: Secondary | ICD-10-CM | POA: Diagnosis not present

## 2022-01-30 DIAGNOSIS — G629 Polyneuropathy, unspecified: Secondary | ICD-10-CM | POA: Diagnosis not present

## 2022-01-30 DIAGNOSIS — Z96642 Presence of left artificial hip joint: Secondary | ICD-10-CM | POA: Diagnosis not present

## 2022-01-30 DIAGNOSIS — I471 Supraventricular tachycardia: Secondary | ICD-10-CM | POA: Diagnosis not present

## 2022-01-30 DIAGNOSIS — I5032 Chronic diastolic (congestive) heart failure: Secondary | ICD-10-CM | POA: Diagnosis not present

## 2022-01-30 DIAGNOSIS — M6281 Muscle weakness (generalized): Secondary | ICD-10-CM | POA: Diagnosis not present

## 2022-01-30 DIAGNOSIS — S72002D Fracture of unspecified part of neck of left femur, subsequent encounter for closed fracture with routine healing: Secondary | ICD-10-CM | POA: Diagnosis not present

## 2022-01-30 DIAGNOSIS — R269 Unspecified abnormalities of gait and mobility: Secondary | ICD-10-CM | POA: Diagnosis not present

## 2022-01-31 ENCOUNTER — Encounter: Payer: Self-pay | Admitting: Orthopaedic Surgery

## 2022-01-31 ENCOUNTER — Ambulatory Visit (INDEPENDENT_AMBULATORY_CARE_PROVIDER_SITE_OTHER): Payer: Medicare PPO | Admitting: Orthopaedic Surgery

## 2022-01-31 VITALS — BP 108/67 | HR 84 | Ht 75.0 in | Wt 191.0 lb

## 2022-01-31 DIAGNOSIS — Z96649 Presence of unspecified artificial hip joint: Secondary | ICD-10-CM

## 2022-01-31 DIAGNOSIS — Z96642 Presence of left artificial hip joint: Secondary | ICD-10-CM

## 2022-01-31 NOTE — Progress Notes (Signed)
   Post-Op Visit Note   Patient: Hector Beltran           Date of Birth: 14-Dec-1927           MRN: [id number] Visit Date: 01/31/2022 PCP: Renford Dills, MD   Assessment & Plan: Reviewed follow-up left hip hemiarthroplasty for femoral neck fracture.  Surgery date 12/24/2021.  Incision looks good he is gradually working up to his goal of 150 feet so he can make it from his room to the cafeteria at [employer].  He can follow-up with me as needed.  Chief Complaint:  Chief Complaint  Patient presents with   Left Hip - Follow-up    12/24/2021 left hip hemiarthroplasty   Visit Diagnoses: S/P Hemiarthroplasty left hip for Fx.   Plan: return PRN    Follow-Up Instructions: No follow-ups on file.   Orders:  No orders of the defined types were placed in this encounter.  No orders of the defined types were placed in this encounter.   Imaging: No results found.  PMFS History: Patient Active Problem List   Diagnosis Date Noted   S/P hip hemiarthroplasty 01/21/2022   Closed left hip fracture (HCC) 12/24/2021   Chronic diastolic HF (heart failure) (HCC) 12/24/2021   Fall at home, initial encounter 12/24/2021   Leukocytosis 12/24/2021   Acute CHF (congestive heart failure) (HCC) 08/22/2020   Cirrhosis of liver (HCC) 08/22/2020   COPD (chronic obstructive pulmonary disease) (HCC) 08/22/2020   Nicotine dependence, cigarettes, uncomplicated 08/22/2020   Hyperkalemia 08/22/2020   Hepatic cyst 08/22/2020   Elevated troponin level not due myocardial infarction 08/22/2020   Acute respiratory failure with hypoxia (HCC) 08/22/2020   Gait abnormality 02/09/2020   Norovirus 11/22/2018   Diarrhea of presumed infectious origin 11/21/2018   Peripheral neuropathy 09/30/2017   Past Medical History:  Diagnosis Date   Constipation    COVID 05/27/2021   Gait abnormality 02/09/2020   Neuropathy    Peripheral neuropathy 09/30/2017   SVT (supraventricular tachycardia) (HCC)     No family history  on file.  Past Surgical History:  Procedure Laterality Date   CYST REMOVAL NECK     HIP ARTHROPLASTY Left 12/24/2021   Procedure: ARTHROPLASTY BIPOLAR HIP (HEMIARTHROPLASTY);  Surgeon: Eldred Manges, MD;  Location: [facility];  Service: Orthopedics;  Laterality: Left;   Social History   Occupational History   Occupation: Retired  Tobacco Use   Smoking status: Former    Types: Cigarettes    Quit date: 08/15/2020    Years since quitting: 1.4   Smokeless tobacco: Never   Tobacco comments:    2 packs per week, quit 08/2020  Vaping Use   Vaping Use: Never used  Substance and Sexual Activity   Alcohol use: Yes    Alcohol/week: 3.0 standard drinks    Types: 1 Glasses of wine, 1 Cans of beer, 1 Shots of liquor per week    Comment: occasionally   Drug use: No   Sexual activity: Not on file

## 2022-02-01 DIAGNOSIS — I5032 Chronic diastolic (congestive) heart failure: Secondary | ICD-10-CM | POA: Diagnosis not present

## 2022-02-01 DIAGNOSIS — J449 Chronic obstructive pulmonary disease, unspecified: Secondary | ICD-10-CM | POA: Diagnosis not present

## 2022-02-01 DIAGNOSIS — Z96642 Presence of left artificial hip joint: Secondary | ICD-10-CM | POA: Diagnosis not present

## 2022-02-01 DIAGNOSIS — S72002D Fracture of unspecified part of neck of left femur, subsequent encounter for closed fracture with routine healing: Secondary | ICD-10-CM | POA: Diagnosis not present

## 2022-02-01 DIAGNOSIS — Z4789 Encounter for other orthopedic aftercare: Secondary | ICD-10-CM | POA: Diagnosis not present

## 2022-02-01 DIAGNOSIS — I471 Supraventricular tachycardia: Secondary | ICD-10-CM | POA: Diagnosis not present

## 2022-02-01 DIAGNOSIS — G629 Polyneuropathy, unspecified: Secondary | ICD-10-CM | POA: Diagnosis not present

## 2022-02-01 DIAGNOSIS — M6281 Muscle weakness (generalized): Secondary | ICD-10-CM | POA: Diagnosis not present

## 2022-02-01 DIAGNOSIS — R269 Unspecified abnormalities of gait and mobility: Secondary | ICD-10-CM | POA: Diagnosis not present

## 2022-02-05 DIAGNOSIS — Z96642 Presence of left artificial hip joint: Secondary | ICD-10-CM | POA: Diagnosis not present

## 2022-02-05 DIAGNOSIS — R262 Difficulty in walking, not elsewhere classified: Secondary | ICD-10-CM | POA: Diagnosis not present

## 2022-02-05 DIAGNOSIS — S72002D Fracture of unspecified part of neck of left femur, subsequent encounter for closed fracture with routine healing: Secondary | ICD-10-CM | POA: Diagnosis not present

## 2022-02-05 DIAGNOSIS — M6281 Muscle weakness (generalized): Secondary | ICD-10-CM | POA: Diagnosis not present

## 2022-02-06 DIAGNOSIS — Z9181 History of falling: Secondary | ICD-10-CM | POA: Diagnosis not present

## 2022-02-06 DIAGNOSIS — R278 Other lack of coordination: Secondary | ICD-10-CM | POA: Diagnosis not present

## 2022-02-06 DIAGNOSIS — R262 Difficulty in walking, not elsewhere classified: Secondary | ICD-10-CM | POA: Diagnosis not present

## 2022-02-06 DIAGNOSIS — Z96642 Presence of left artificial hip joint: Secondary | ICD-10-CM | POA: Diagnosis not present

## 2022-02-06 DIAGNOSIS — M6281 Muscle weakness (generalized): Secondary | ICD-10-CM | POA: Diagnosis not present

## 2022-02-06 DIAGNOSIS — S72002D Fracture of unspecified part of neck of left femur, subsequent encounter for closed fracture with routine healing: Secondary | ICD-10-CM | POA: Diagnosis not present

## 2022-02-13 ENCOUNTER — Other Ambulatory Visit: Payer: Self-pay | Admitting: Internal Medicine

## 2022-02-13 ENCOUNTER — Ambulatory Visit
Admission: RE | Admit: 2022-02-13 | Discharge: 2022-02-13 | Disposition: A | Discharge status: Home / Self Care | Payer: Medicare PPO | Source: Ambulatory Visit | Attending: Internal Medicine | Admitting: Internal Medicine

## 2022-02-13 DIAGNOSIS — L89626 Pressure-induced deep tissue damage of left heel: Secondary | ICD-10-CM

## 2022-02-13 DIAGNOSIS — S72002D Fracture of unspecified part of neck of left femur, subsequent encounter for closed fracture with routine healing: Secondary | ICD-10-CM | POA: Diagnosis not present

## 2022-02-13 DIAGNOSIS — I700 Atherosclerosis of aorta: Secondary | ICD-10-CM | POA: Diagnosis not present

## 2022-02-13 DIAGNOSIS — M7732 Calcaneal spur, left foot: Secondary | ICD-10-CM | POA: Diagnosis not present

## 2022-02-13 DIAGNOSIS — I471 Supraventricular tachycardia: Secondary | ICD-10-CM | POA: Diagnosis not present

## 2022-02-13 DIAGNOSIS — I5030 Unspecified diastolic (congestive) heart failure: Secondary | ICD-10-CM | POA: Diagnosis not present

## 2022-02-13 DIAGNOSIS — S99922A Unspecified injury of left foot, initial encounter: Secondary | ICD-10-CM | POA: Diagnosis not present

## 2022-02-24 ENCOUNTER — Ambulatory Visit: Payer: Medicare PPO | Admitting: Podiatry

## 2022-02-24 ENCOUNTER — Encounter: Payer: Self-pay | Admitting: Podiatry

## 2022-02-24 DIAGNOSIS — M79675 Pain in left toe(s): Secondary | ICD-10-CM

## 2022-02-24 DIAGNOSIS — B351 Tinea unguium: Secondary | ICD-10-CM | POA: Diagnosis not present

## 2022-02-24 DIAGNOSIS — M79674 Pain in right toe(s): Secondary | ICD-10-CM | POA: Diagnosis not present

## 2022-02-24 DIAGNOSIS — L97422 Non-pressure chronic ulcer of left heel and midfoot with fat layer exposed: Secondary | ICD-10-CM | POA: Diagnosis not present

## 2022-02-24 NOTE — Progress Notes (Signed)
Subjective:   Patient ID: Hector Beltran, male   DOB: 88 y.o.   MRN: [id number]   HPI Patient presents with 2 caregivers and he has had a fractured hip has been in rehab and developed an ulcer on his left heel and has some crusted tissue on his right second and third digit and has very long nailbeds.  Due to go to wound care on Wednesday does not currently smoke is not active in wheelchair   Review of Systems  All other systems reviewed and are negative.       Objective:  Physical Exam Vitals and nursing note reviewed.  Constitutional:      Appearance: He is well-developed.  Pulmonary:     Effort: Pulmonary effort is normal.  Musculoskeletal:        General: Normal range of motion.  Skin:    General: Skin is warm.  Neurological:     Mental Status: He is alert.     Vascular status shows that he has diminishment of PT DP pulses as edema secondary to immobilization that he has been undergoing and has superficial breakdown of the left heel secondary to pressure ulceration and on the right second and third toes are some crusted tissue which occurred when he had his injury and broke his hip.  Does have good digital perfusion his feet are relatively warm.  Patient has severely elongated thickened nailbeds 1-5 both feet they cannot take care of     Assessment:  Very poor health individual with history of fractured hip and implant with pressure ulcer left and digital deformities right and has mycotic nail infection with pain 1-5 both feet     Plan:  H&P reviewed conditions and the importance of trying to keep the pressure off of the heel and we went over techniques to do that.  I went ahead today and I debrided nailbeds 1-5 both feet he is going to see wound care and see what they can do to try to help him and I think if he keep pressure off this will heal I do not see signs of current infection

## 2022-02-25 NOTE — Progress Notes (Signed)
AZARIAH, KRAJEWSKI ([id number]) Visit Report for 02/26/2022 Allergy List Details Patient Name: Date of Service: [name] 02/26/2022 1:15 PM Medical Record Number: [id number] Patient Account Number: [id number] Date of Birth/Sex: Treating RN: 07-08-1928 (88 y.o. [name] Primary Care Joangel Vanosdol: Seward Carol Other Clinician: Referring Roschelle Calandra: Treating Brittni Hult/Extender: Jennye Moccasin in Treatment: 0 Allergies Active Allergies No Known Allergies Allergy Notes Electronic Signature(s) Signed: 02/26/2022 5:21:51 PM By: Lorrin Jackson Previous Signature: 02/25/2022 4:21:18 PM Version By: Lorrin Jackson Entered By: Lorrin Jackson on 02/26/2022 13:42:22 -------------------------------------------------------------------------------- Arrival Information Details Patient Name: Date of Service: [name], [name] 02/26/2022 1:15 PM Medical Record Number: [id number] Patient Account Number: [id number] Date of Birth/Sex: Treating RN: 11-15-1927 (88 y.o. [name] Primary Care Anyelina Claycomb: Seward Carol Other Clinician: Referring Joliene Salvador: Treating Shaylah Mcghie/Extender: Jennye Moccasin in Treatment: 0 Visit Information Patient Arrived: Wheel Chair Arrival Time: 13:32 Accompanied By: Pandora Leiter, Daughter in Law Transfer Assistance: Manual Patient Identification Verified: Yes Secondary Verification Process Completed: Yes Patient Requires Transmission-Based Precautions: No Patient Has Alerts: No Electronic Signature(s) Signed: 02/26/2022 5:21:51 PM By: Lorrin Jackson Entered By: Lorrin Jackson on 02/26/2022 13:36:49 -------------------------------------------------------------------------------- Clinic Level of Care Assessment Details Patient Name: Date of Service: [name] 02/26/2022 1:15 PM Medical Record Number: [id number] Patient Account Number: [id number] Date of Birth/Sex: Treating RN: 28-Apr-1928 (88 y.o. [name] Primary Care Ozzie Knobel: Seward Carol Other Clinician: Referring Lynia Landry: Treating Herson Prichard/Extender: Jennye Moccasin in Treatment: 0 Clinic Level of Care Assessment Items TOOL 1 Quantity Score X- 1 0 Use when EandM and Procedure is performed on INITIAL visit ASSESSMENTS - Nursing Assessment / Reassessment X- 1 20 General Physical Exam (combine w/ comprehensive assessment (listed just below) when performed on new pt. evals) X- 1 25 Comprehensive Assessment (HX, ROS, Risk Assessments, Wounds Hx, etc.) ASSESSMENTS - Wound and Skin Assessment / Reassessment []  - 0 Dermatologic / Skin Assessment (not related to wound area) ASSESSMENTS - Ostomy and/or Continence Assessment and Care []  - 0 Incontinence Assessment and Management []  - 0 Ostomy Care Assessment and Management (repouching, etc.) PROCESS - Coordination of Care []  - 0 Simple Patient / Family Education for ongoing care X- 1 20 Complex (extensive) Patient / Family Education for ongoing care X- 1 10 Staff obtains Programmer, systems, Records, T Results / Process Orders est X- 1 10 Staff telephones HHA, Nursing Homes / Clarify orders / etc []  - 0 Routine Transfer to another Facility (non-emergent condition) []  - 0 Routine Hospital Admission (non-emergent condition) []  - 0 New Admissions / Biomedical engineer / Ordering NPWT Apligraf, etc. , []  - 0 Emergency Hospital Admission (emergent condition) PROCESS - Special Needs []  - 0 Pediatric / Minor Patient Management []  - 0 Isolation Patient Management []  - 0 Hearing / Language / Visual special needs []  - 0 Assessment of Community assistance (transportation, D/C planning, etc.) []  - 0 Additional assistance / Altered mentation []  - 0 Support Surface(s) Assessment (bed, cushion, seat, etc.) INTERVENTIONS - Miscellaneous []  - 0 External ear exam []  - 0 Patient Transfer (multiple staff / Civil Service fast streamer / Similar devices) []  - 0 Simple Staple /  Suture removal (25 or less) []  - 0 Complex Staple / Suture removal (26 or more) []  - 0 Hypo/Hyperglycemic Management (do not check if billed separately) X- 1 15 Ankle / Brachial Index (ABI) - do not check if billed separately Has the patient been seen at the hospital within  the last three years: Yes Total Score: 100 Level Of Care: New/Established - Level 3 Electronic Signature(s) Signed: 02/26/2022 5:21:51 PM By: Lorrin Jackson Entered By: Lorrin Jackson on 02/26/2022 14:40:41 -------------------------------------------------------------------------------- Encounter Discharge Information Details Patient Name: Date of Service: [name], [name] 02/26/2022 1:15 PM Medical Record Number: [id number] Patient Account Number: [id number] Date of Birth/Sex: Treating RN: 21-Oct-1927 (88 y.o. [name] Primary Care Elin Fenley: Seward Carol Other Clinician: Referring Carol Theys: Treating Jubilee Vivero/Extender: Jennye Moccasin in Treatment: 0 Encounter Discharge Information Items Post Procedure Vitals Discharge Condition: Stable Temperature (F): 97.7 Ambulatory Status: Wheelchair Pulse (bpm): 87 Discharge Destination: Home Respiratory Rate (breaths/min): 20 Transportation: Private Auto Blood Pressure (mmHg): 146/76 Accompanied By: Pandora Leiter, Daughter in Law Schedule Follow-up Appointment: Yes Clinical Summary of Care: Provided on 02/26/2022 Form Type Recipient Paper Patient Patient Electronic Signature(s) Signed: 02/26/2022 2:51:00 PM By: Lorrin Jackson Entered By: Lorrin Jackson on 02/26/2022 14:51:00 -------------------------------------------------------------------------------- Lower Extremity Assessment Details Patient Name: Date of Service: [name] 02/26/2022 1:15 PM Medical Record Number: [id number] Patient Account Number: [id number] Date of Birth/Sex: Treating RN: [birth year] (88 y.o. [name] Primary Care Moses Odoherty: Seward Carol Other  Clinician: Referring Kooper Godshall: Treating Kazuko Clemence/Extender: Jennye Moccasin in Treatment: 0 Edema Assessment Assessed: Shirlyn Goltz: Yes] [Right: No] Edema: [Left: Ye] [Right: s] Calf Left: Right: Point of Measurement: 33 cm From Medial Instep 35.7 cm Ankle Left: Right: Point of Measurement: 10 cm From Medial Instep 24.9 cm Knee To Floor Left: Right: From Medial Instep 43 cm Vascular Assessment Pulses: Dorsalis Pedis Palpable: [Left:Yes] Doppler Audible: [Left:Yes] Blood Pressure: Brachial: [Left:170] Ankle: [Right:Dorsalis Pedis: 146 0.86] Electronic Signature(s) Signed: 02/26/2022 5:21:51 PM By: Lorrin Jackson Entered By: Lorrin Jackson on 02/26/2022 13:56:08 -------------------------------------------------------------------------------- Multi-Disciplinary Care Plan Details Patient Name: Date of Service: [name], [name] 02/26/2022 1:15 PM Medical Record Number: [id number] Patient Account Number: [id number] Date of Birth/Sex: Treating RN: [birth year] (88 y.o. [name] Primary Care Abdifatah Colquhoun: Seward Carol Other Clinician: Referring Laisa Larrick: Treating Stratton Villwock/Extender: Jennye Moccasin in Treatment: 0 Active Inactive Abuse / Safety / Falls / Self Care Management Nursing Diagnoses: History of Falls Goals: Patient will remain injury free related to falls Date Initiated: 02/26/2022 Target Resolution Date: 04/02/2022 Goal Status: Active Interventions: Assess fall risk on admission and as needed Assess impairment of mobility on admission and as needed per policy Notes: Wound/Skin Impairment Nursing Diagnoses: Impaired tissue integrity Goals: Patient/caregiver will verbalize understanding of skin care regimen Date Initiated: 02/26/2022 Target Resolution Date: 04/02/2022 Goal Status: Active Ulcer/skin breakdown will have a volume reduction of 30% by week 4 Date Initiated: 02/26/2022 Target Resolution Date: 04/02/2022 Goal  Status: Active Interventions: Assess patient/caregiver ability to obtain necessary supplies Assess patient/caregiver ability to perform ulcer/skin care regimen upon admission and as needed Assess ulceration(s) every visit Provide education on ulcer and skin care Treatment Activities: Topical wound management initiated : 02/26/2022 Notes: Electronic Signature(s) Signed: 02/26/2022 5:21:51 PM By: Lorrin Jackson Entered By: Lorrin Jackson on 02/26/2022 14:02:09 -------------------------------------------------------------------------------- Pain Assessment Details Patient Name: Date of Service: [name], [name] 02/26/2022 1:15 PM Medical Record Number: [id number] Patient Account Number: [id number] Date of Birth/Sex: Treating RN: [birth year] (88 y.o. [name] Primary Care Finnley Lewis: Seward Carol Other Clinician: Referring Samanta Gal: Treating Perfecto Purdy/Extender: Jennye Moccasin in Treatment: 0 Active Problems Location of Pain Severity and Description of Pain Patient Has Paino No Site Locations Pain Management and Medication Current Pain Management: Electronic  Signature(s) Signed: 02/26/2022 5:21:51 PM By: Lorrin Jackson Entered By: Lorrin Jackson on 02/26/2022 13:47:28 -------------------------------------------------------------------------------- Patient/Caregiver Education Details Patient Name: Date of Service: [name], [name] 6/14/2023andnbsp1:15 PM Medical Record Number: [id number] Patient Account Number: [id number] Date of Birth/Gender: Treating RN: 02-03-1928 (88 y.o. [name] Primary Care Physician: Seward Carol Other Clinician: Referring Physician: Treating Physician/Extender: Jennye Moccasin in Treatment: 0 Education Assessment Education Provided To: Patient Education Topics Provided Safety: Methods: Explain/Verbal Responses: State content correctly Wound/Skin Impairment: Methods: Demonstration,  Explain/Verbal, Printed Responses: State content correctly Electronic Signature(s) Signed: 02/26/2022 5:21:51 PM By: Lorrin Jackson Entered By: Lorrin Jackson on 02/26/2022 14:02:32 -------------------------------------------------------------------------------- Wound Assessment Details Patient Name: Date of Service: [name], [name] 02/26/2022 1:15 PM Medical Record Number: [id number] Patient Account Number: [id number] Date of Birth/Sex: Treating RN: 11-06-1927 (88 y.o. [name] Primary Care Verner Kopischke: Seward Carol Other Clinician: Referring Talasia Saulter: Treating Zamirah Denny/Extender: Jennye Moccasin in Treatment: 0 Wound Status Wound Number: 1 Primary Pressure Ulcer Etiology: Wound Location: Left Calcaneus Wound Open Wounding Event: Pressure Injury Status: Date Acquired: 01/08/2022 Comorbid Chronic Obstructive Pulmonary Disease (COPD), Congestive Weeks Of Treatment: 0 History: Heart Failure, Coronary Artery Disease, Hypertension, Cirrhosis , Clustered Wound: No Neuropathy Photos Wound Measurements Length: (cm) 3 Width: (cm) 3 Depth: (cm) 0.2 Area: (cm) 7.069 Volume: (cm) 1.414 % Reduction in Area: 0% % Reduction in Volume: 99.3% Epithelialization: None Tunneling: No Undermining: No Wound Description Classification: Category/Stage III Wound Margin: Distinct, outline attached Exudate Amount: Medium Exudate Type: Serosanguineous Exudate Color: red, brown Foul Odor After Cleansing: No Slough/Fibrino Yes Wound Bed Granulation Amount: Small (1-33%) Exposed Structure Granulation Quality: Red, Pink Fascia Exposed: No Necrotic Amount: Large (67-100%) Fat Layer (Subcutaneous Tissue) Exposed: Yes Tendon Exposed: No Muscle Exposed: No Joint Exposed: No Bone Exposed: No Treatment Notes Wound #1 (Calcaneus) Wound Laterality: Left Cleanser Soap and Water Discharge Instruction: Wash with Dial Antibacterial Soap Peri-Wound  Care Topical Primary Dressing Hydrofera Blue Ready Foam, 4x5 in Discharge Instruction: Apply to wound bed as instructed Secondary Dressing Woven Gauze Sponge, Non-Sterile 4x4 in Discharge Instruction: Apply over primary dressing as directed. Secured With [employer], 4.5x3.1 (in/yd) Discharge Instruction: Secure with Kerlix as directed. 14M Medipore H Soft Cloth Surgical T ape, 4 x 10 (in/yd) Discharge Instruction: Secure with tape as directed. Compression Wrap Compression Stockings Add-Ons Electronic Signature(s) Signed: 02/26/2022 5:21:51 PM By: Lorrin Jackson Entered By: Lorrin Jackson on 02/26/2022 14:26:56 -------------------------------------------------------------------------------- Vitals Details Patient Name: Date of Service: [name], [name] 02/26/2022 1:15 PM Medical Record Number: [id number] Patient Account Number: [id number] Date of Birth/Sex: Treating RN: [birth year] (88 y.o. [name] Primary Care Danira Nylander: Seward Carol Other Clinician: Referring Shasha Buchbinder: Treating Farzad Tibbetts/Extender: Jennye Moccasin in Treatment: 0 Vital Signs Time Taken: 13:36 Temperature (F): 97.7 Height (in): 75 Pulse (bpm): 87 Source: Stated Respiratory Rate (breaths/min): 20 Weight (lbs): 192 Blood Pressure (mmHg): 146/76 Source: Stated Reference Range: 80 - 120 mg / dl Body Mass Index (BMI): 24 Airway Pulse Oximetry (%): 93 Electronic Signature(s) Signed: 02/26/2022 5:21:51 PM By: Lorrin Jackson Entered By: Lorrin Jackson on 02/26/2022 13:41:58

## 2022-02-26 ENCOUNTER — Encounter (HOSPITAL_BASED_OUTPATIENT_CLINIC_OR_DEPARTMENT_OTHER): Payer: Medicare PPO | Attending: Physician Assistant | Admitting: Physician Assistant

## 2022-02-26 DIAGNOSIS — Z6824 Body mass index (BMI) 24.0-24.9, adult: Secondary | ICD-10-CM | POA: Insufficient documentation

## 2022-02-26 DIAGNOSIS — Z87891 Personal history of nicotine dependence: Secondary | ICD-10-CM | POA: Diagnosis not present

## 2022-02-26 DIAGNOSIS — I5042 Chronic combined systolic (congestive) and diastolic (congestive) heart failure: Secondary | ICD-10-CM | POA: Insufficient documentation

## 2022-02-26 DIAGNOSIS — I110 Hypertensive heart disease with heart failure: Secondary | ICD-10-CM | POA: Insufficient documentation

## 2022-02-26 DIAGNOSIS — Z96642 Presence of left artificial hip joint: Secondary | ICD-10-CM | POA: Diagnosis not present

## 2022-02-26 DIAGNOSIS — E440 Moderate protein-calorie malnutrition: Secondary | ICD-10-CM | POA: Diagnosis not present

## 2022-02-26 DIAGNOSIS — L89623 Pressure ulcer of left heel, stage 3: Secondary | ICD-10-CM | POA: Insufficient documentation

## 2022-02-26 DIAGNOSIS — I2510 Atherosclerotic heart disease of native coronary artery without angina pectoris: Secondary | ICD-10-CM | POA: Insufficient documentation

## 2022-02-26 DIAGNOSIS — J449 Chronic obstructive pulmonary disease, unspecified: Secondary | ICD-10-CM | POA: Diagnosis not present

## 2022-02-26 NOTE — Progress Notes (Signed)
Hector Beltran ([id number]) Visit Report for 02/26/2022 Chief Complaint Document Details Patient Name: Date of Service: Hector Beltran 02/26/2022 1:15 PM Medical Record Number: [id number] Patient Account Number: [id number] Date of Birth/Sex: Treating RN: 16-Mar-1928 (88 y.o. Hector Beltran Primary Care Provider: Renford Dills Other Clinician: Referring Provider: Treating Provider/Extender: Prudencio Burly in Treatment: 0 Information Obtained from: Patient Chief Complaint Pressure ulcer left heel Electronic Signature(s) Signed: 02/26/2022 2:17:25 PM By: Lenda Kelp PA-C Entered By: Lenda Kelp on 02/26/2022 14:17:25 -------------------------------------------------------------------------------- Debridement Details Patient Name: Date of Service: Hector Beltran, [name] 02/26/2022 1:15 PM Medical Record Number: [id number] Patient Account Number: [id number] Date of Birth/Sex: Treating RN: 25-Jun-1928 (88 y.o. Hector Beltran Primary Care Provider: Renford Dills Other Clinician: Referring Provider: Treating Provider/Extender: Prudencio Burly in Treatment: 0 Debridement Performed for Assessment: Wound #1 Left Calcaneus Performed By: Physician Lenda Kelp, PA Debridement Type: Debridement Level of Consciousness (Pre-procedure): Awake and Alert Pre-procedure Verification/Time Out Yes - 14:21 Taken: Start Time: 14:22 Pain Control: Other : Benzocaine T Area Debrided (L x W): otal 3 (cm) x 3 (cm) = 9 (cm) Tissue and other material debrided: Non-Viable, Slough, Subcutaneous, Slough Level: Skin/Subcutaneous Tissue Debridement Description: Excisional Instrument: Curette Bleeding: Minimum Hemostasis Achieved: Pressure End Time: 14:26 Response to Treatment: Procedure was tolerated well Level of Consciousness (Post- Awake and Alert procedure): Post Debridement Measurements of Total Wound Length: (cm) 3 Stage: Category/Stage  III Width: (cm) 3 Depth: (cm) 0.2 Volume: (cm) 1.414 Character of Wound/Ulcer Post Debridement: Stable Post Procedure Diagnosis Same as Pre-procedure Electronic Signature(s) Signed: 02/26/2022 5:11:12 PM By: Lenda Kelp PA-C Signed: 02/26/2022 5:21:51 PM By: Antonieta Iba Entered By: Antonieta Iba on 02/26/2022 14:28:11 -------------------------------------------------------------------------------- HPI Details Patient Name: Date of Service: Hector Beltran, [name] 02/26/2022 1:15 PM Medical Record Number: [id number] Patient Account Number: [id number] Date of Birth/Sex: Treating RN: 1928-05-08 (88 y.o. Hector Beltran Primary Care Provider: Renford Dills Other Clinician: Referring Provider: Treating Provider/Extender: Prudencio Burly in Treatment: 0 History of Present Illness HPI Description: 02-26-2022 upon evaluation today patient presents for initial inspection here in our clinic concerning issues that he has been having with his left heel he has a pressure ulcer that ensued following having fallen and fractured his hip. This actually occurred in April and he had surgery for total hip replacement on the left of December 24, 2021. He was discharged to a SNF at the point of leaving the hospital and it sometime during the hospital stay and SNF stay he developed a pressure ulcer to his left heel. At this point it does not seem to be getting any worse he is at [facility] independent living and they need orders to know what to do. Right now they do think that they can do the dressing changes but he needs to have the supplies and the recommendations on hand. He did have an x-ray performed on 02-13-2022 which was negative for osteomyelitis this is good news right now they just been using a dry dressing and that is good. Patient does have a history of a left hip replacement total, hypertension, coronary artery disease, COPD, congestive heart failure, and protein  calorie malnutrition as noted by low serum albumin and protein. Electronic Signature(s) Signed: 02/26/2022 3:23:38 PM By: Lenda Kelp PA-C Entered By: Lenda Kelp on 02/26/2022 15:23:38 -------------------------------------------------------------------------------- Physical Exam Details Patient Name: Date of Service: Hector Beltran, [name]  02/26/2022 1:15 PM Medical Record Number: [id number] Patient Account Number: [id number] Date of Birth/Sex: Treating RN: 11-24-1927 (88 y.o. [name] Primary Care Provider: Seward Carol Other Clinician: Referring Provider: Treating Provider/Extender: Jennye Moccasin in Treatment: 0 Constitutional patient is hypertensive.. pulse regular and within target range for patient.[name] respirations regular, non-labored and within target range for patient.[name] temperature within target range for patient.. Well-nourished and well-hydrated in no acute distress. Eyes conjunctiva clear no eyelid edema noted. pupils equal round and reactive to light and accommodation. Ears, Nose, Mouth, and Throat no gross abnormality of ear auricles or external auditory canals. normal hearing noted during conversation. mucus membranes moist. Respiratory normal breathing without difficulty. Cardiovascular Absent posterior tibial and dorsalis pedis pulses bilateral lower extremities. no clubbing, cyanosis, significant edema, <3 sec cap refill. Musculoskeletal Patient unable to walk without assistance. Psychiatric this patient is able to make decisions and demonstrates good insight into disease process. Alert and Oriented x 3. pleasant and cooperative. Notes Upon inspection patient's wound bed actually showed signs of having some slough and biofilm on the surface of the wound I discussed with him that we do need to see what we can do about trying to debride the wound in order to improve the overall status and healing possibilities here. The good news is the  wounds on the back of the heels that should not affect him being able to walk in physical therapy. With that being said overall I do feel like that he is actually doing decently well and I think walking is not going to be a major issue due to the fact that this is more posterior he should be able to stand on the plantar aspect without causing damage he just needs to have shoes that do not rub on this posterior aspect. Electronic Signature(s) Signed: 02/26/2022 3:24:19 PM By: Worthy Keeler PA-C Entered By: Worthy Keeler on 02/26/2022 15:24:19 -------------------------------------------------------------------------------- Physician Orders Details Patient Name: Date of Service: Hector Beltran, [name] 02/26/2022 1:15 PM Medical Record Number: [id number] Patient Account Number: [id number] Date of Birth/Sex: Treating RN: 03/17/1928 (88 y.o. [name] Primary Care Provider: Seward Carol Other Clinician: Referring Provider: Treating Provider/Extender: Jennye Moccasin in Treatment: 0 Verbal / Phone Orders: No Diagnosis Coding ICD-10 Coding Code Description [phone] Pressure ulcer of left heel, stage 3 Z96.642 Presence of left artificial hip joint I10 Essential (primary) hypertension I25.10 Atherosclerotic heart disease of native coronary artery without angina pectoris J44.9 Chronic obstructive pulmonary disease, unspecified I50.42 Chronic combined systolic (congestive) and diastolic (congestive) heart failure E44.0 Moderate protein-calorie malnutrition Follow-up Appointments ppointment in 1 week. - 03/05/22 @ 12:30pm with Orland Jarred, RN (Room 7) Return A 03/12/22 @ 2:45pm Bathing/ Shower/ Hygiene May shower with protection but do not get wound dressing(s) wet. - May use cast protector bag from [employer], [place] or [facility] on days dressing not being changed. May shower and wash wound with soap and water. - May wash with antibacterial soap when changing  dressing. Edema Control - Lymphedema / SCD / Other Elevate legs to the level of the heart or above for 30 minutes daily and/or when sitting, a frequency of: - throughout the day Avoid standing for long periods of time. Off-Loading Other: - Avoid/Limit pressure to heel. Recommend wearing a sandal that does not rub heel area. Additional Orders / Instructions Follow Nutritious Diet - High Protein Diet: 100g of Protein daily Wound Treatment Wound #1 - Calcaneus Wound Laterality: Left Cleanser:  Soap and Water 3 x Per Week/30 Days Discharge Instructions: Wash with Dial Antibacterial Soap Prim Dressing: Hydrofera Blue Ready Foam, 4x5 in (DME) (Generic) 3 x Per Week/30 Days ary Discharge Instructions: Apply to wound bed as instructed Secondary Dressing: Woven Gauze Sponge, Non-Sterile 4x4 in (DME) (Generic) 3 x Per Week/30 Days Discharge Instructions: Apply over primary dressing as directed. Secured With: [employer], 4.5x3.1 (in/yd) (DME) (Generic) 3 x Per Week/30 Days Discharge Instructions: Secure with Kerlix as directed. Secured With: 82M Medipore H Soft Cloth Surgical T ape, 4 x 10 (in/yd) (DME) (Generic) 3 x Per Week/30 Days Discharge Instructions: Secure with tape as directed. Electronic Signature(s) Signed: 02/26/2022 5:11:12 PM By: Worthy Keeler PA-C Signed: 02/26/2022 5:21:51 PM By: Lorrin Jackson Entered By: Lorrin Jackson on 02/26/2022 14:52:05 -------------------------------------------------------------------------------- Problem List Details Patient Name: Date of Service: Hector Beltran, [name] 02/26/2022 1:15 PM Medical Record Number: [id number] Patient Account Number: [id number] Date of Birth/Sex: Treating RN: 30-Sep-1927 (88 y.o. [name] Primary Care Provider: Seward Carol Other Clinician: Referring Provider: Treating Provider/Extender: Jennye Moccasin in Treatment: 0 Active Problems ICD-10 Encounter Code Description Active Date  MDM Diagnosis [phone] Pressure ulcer of left heel, stage 3 02/26/2022 No Yes Z96.642 Presence of left artificial hip joint 02/26/2022 No Yes I10 Essential (primary) hypertension 02/26/2022 No Yes I25.10 Atherosclerotic heart disease of native coronary artery without angina pectoris 02/26/2022 No Yes J44.9 Chronic obstructive pulmonary disease, unspecified 02/26/2022 No Yes I50.42 Chronic combined systolic (congestive) and diastolic (congestive) heart failure 02/26/2022 No Yes E44.0 Moderate protein-calorie malnutrition 02/26/2022 No Yes Inactive Problems Resolved Problems Electronic Signature(s) Signed: 02/26/2022 2:16:55 PM By: Worthy Keeler PA-C Entered By: Worthy Keeler on 02/26/2022 14:16:54 -------------------------------------------------------------------------------- Progress Note Details Patient Name: Date of Service: Hector Beltran, [name] 02/26/2022 1:15 PM Medical Record Number: [id number] Patient Account Number: [id number] Date of Birth/Sex: Treating RN: 1928/06/21 (88 y.o. [name] Primary Care Provider: Seward Carol Other Clinician: Referring Provider: Treating Provider/Extender: Jennye Moccasin in Treatment: 0 Subjective Chief Complaint Information obtained from Patient Pressure ulcer left heel History of Present Illness (HPI) 02-26-2022 upon evaluation today patient presents for initial inspection here in our clinic concerning issues that he has been having with his left heel he has a pressure ulcer that ensued following having fallen and fractured his hip. This actually occurred in April and he had surgery for total hip replacement on the left of December 24, 2021. He was discharged to a SNF at the point of leaving the hospital and it sometime during the hospital stay and SNF stay he developed a pressure ulcer to his left heel. At this point it does not seem to be getting any worse he is at [facility] independent living and they need orders to  know what to do. Right now they do think that they can do the dressing changes but he needs to have the supplies and the recommendations on hand. He did have an x-ray performed on 02-13-2022 which was negative for osteomyelitis this is good news right now they just been using a dry dressing and that is good. Patient does have a history of a left hip replacement total, hypertension, coronary artery disease, COPD, congestive heart failure, and protein calorie malnutrition as noted by low serum albumin and protein. Patient History Information obtained from Patient, Chart. Allergies No Known Allergies Family History Heart Disease - Father, Hypertension - Father, Stroke - Mother, No family history of  Cancer, Diabetes, Hereditary Spherocytosis, Kidney Disease, Lung Disease, Seizures, Thyroid Problems, Tuberculosis. Social History Former smoker, Marital Status - Widowed, Alcohol Use - Moderate, Drug Use - No History, Caffeine Use - Daily. Medical History Respiratory Patient has history of Chronic Obstructive Pulmonary Disease (COPD) Cardiovascular Patient has history of Congestive Heart Failure, Coronary Artery Disease, Hypertension Gastrointestinal Patient has history of Cirrhosis Neurologic Patient has history of Neuropathy Medical A Surgical History Notes nd Genitourinary BPH Review of Systems (ROS) Eyes Complains or has symptoms of Glasses / Contacts. Ear/Nose/Mouth/Throat Denies complaints or symptoms of Chronic sinus problems or rhinitis. Endocrine Denies complaints or symptoms of Heat/cold intolerance. Integumentary (Skin) Complains or has symptoms of Wounds. Psychiatric Denies complaints or symptoms of Claustrophobia, Suicidal. Objective Constitutional patient is hypertensive.. pulse regular and within target range for patient.[name] respirations regular, non-labored and within target range for patient.[name] temperature within target range for patient.. Well-nourished and well-hydrated  in no acute distress. Vitals Time Taken: 1:36 PM, Height: 75 in, Source: Stated, Weight: 192 lbs, Source: Stated, BMI: 24, Temperature: 97.7 F, Pulse: 87 bpm, Respiratory Rate: 20 breaths/min, Blood Pressure: 146/76 mmHg, Pulse Oximetry: 93 %. Eyes conjunctiva clear no eyelid edema noted. pupils equal round and reactive to light and accommodation. Ears, Nose, Mouth, and Throat no gross abnormality of ear auricles or external auditory canals. normal hearing noted during conversation. mucus membranes moist. Respiratory normal breathing without difficulty. Cardiovascular Absent posterior tibial and dorsalis pedis pulses bilateral lower extremities. no clubbing, cyanosis, significant edema, Musculoskeletal Patient unable to walk without assistance. Psychiatric this patient is able to make decisions and demonstrates good insight into disease process. Alert and Oriented x 3. pleasant and cooperative. General Notes: Upon inspection patient's wound bed actually showed signs of having some slough and biofilm on the surface of the wound I discussed with him that we do need to see what we can do about trying to debride the wound in order to improve the overall status and healing possibilities here. The good news is the wounds on the back of the heels that should not affect him being able to walk in physical therapy. With that being said overall I do feel like that he is actually doing decently well and I think walking is not going to be a major issue due to the fact that this is more posterior he should be able to stand on the plantar aspect without causing damage he just needs to have shoes that do not rub on this posterior aspect. Integumentary (Hair, Skin) Wound #1 status is Open. Original cause of wound was Pressure Injury. The date acquired was: 01/08/2022. The wound is located on the Left Calcaneus. The wound measures 3cm length x 3cm width x 0.2cm depth; 7.069cm^2 area and 1.414cm^3 volume. There  is Fat Layer (Subcutaneous Tissue) exposed. There is no tunneling or undermining noted. There is a medium amount of serosanguineous drainage noted. The wound margin is distinct with the outline attached to the wound base. There is small (1-33%) red, pink granulation within the wound bed. There is a large (67-100%) amount of necrotic tissue within the wound bed. 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 There was a  Excisional Skin/Subcutaneous Tissue Debridement with a total area of 9 sq cm performed by Worthy Keeler, PA. With the following instrument(s): Curette to remove Non-Viable tissue/material. Material removed includes Subcutaneous Tissue and Slough and after achieving pain control using Other (Benzocaine). No specimens were taken. A time out was conducted at 14:21, prior to the start of the procedure. A Minimum amount of bleeding was controlled with Pressure. The procedure was tolerated well. Post Debridement Measurements: 3cm length x 3cm width x 0.2cm depth; 1.414cm^3 volume. Post debridement Stage noted as Category/Stage III. Character of Wound/Ulcer Post Debridement is stable. Post procedure Diagnosis Wound #1: Same as Pre-Procedure Plan Follow-up Appointments: Return Appointment in 1 week. - 03/05/22 @ 12:30pm with Orland Jarred, RN (Room 7) 03/12/22 @ 2:45pm Bathing/ Shower/ Hygiene: May shower with protection but do not get wound dressing(s) wet. - May use cast protector bag from [employer], [place] or [facility] on days dressing not  being changed. May shower and wash wound with soap and water. - May wash with antibacterial soap when changing dressing. Edema Control - Lymphedema / SCD / Other: Elevate legs to the level of the heart or above for 30 minutes daily and/or when sitting, a frequency of: - throughout the day Avoid standing for long periods of time. Off-Loading: Other: - Avoid/Limit pressure to heel. Recommend wearing a sandal that does not rub heel area. Additional Orders / Instructions: Follow Nutritious Diet - High Protein Diet: 100g of Protein daily WOUND #1: - Calcaneus Wound Laterality: Left Cleanser: Soap and Water 3 x Per Week/30 Days Discharge Instructions: Wash with Dial Antibacterial Soap Prim Dressing: Hydrofera Blue Ready Foam, 4x5 in (DME) (Generic) 3 x Per Week/30 Days ary Discharge Instructions: Apply to wound bed as instructed Secondary Dressing: Woven Gauze Sponge, Non-Sterile 4x4 in (DME) (Generic) 3 x Per Week/30 Days Discharge Instructions: Apply over primary dressing as directed. Secured With: [employer], 4.5x3.1 (in/yd) (DME) (Generic) 3 x Per Week/30 Days Discharge Instructions: Secure with Kerlix as directed. Secured With: 44M Medipore H Soft Cloth Surgical T ape, 4 x 10 (in/yd) (DME) (Generic) 3 x Per Week/30 Days Discharge Instructions: Secure with tape as directed. 1. I would recommend currently that we going continue with the wound care measures as before and the patient is in agreement with the plan. This includes the use of the [facility] dressing which I do think is doing quite well. 2. I am also can recommend that we have the patient continue with the ABD pad and roll gauze to secure in place. 3. I am also going to suggest the patient should continue to monitor for any signs of worsening or infection if anything changes he should let me know and his family will be keeping an eye as well my hope is however this will heal and start turning around quite readily with the  [facility]. We will see patient back for reevaluation in 1 week here in the clinic. If anything worsens or changes patient will contact our office for additional recommendations. Electronic Signature(s) Signed: 02/26/2022 3:25:05 PM By: Worthy Keeler PA-C Entered By: Worthy Keeler on 02/26/2022 15:25:05 -------------------------------------------------------------------------------- HxROS Details Patient Name: Date of Service: Hector Beltran, [name] 02/26/2022 1:15 PM Medical Record Number: [id number] Patient Account Number: [id number] Date of Birth/Sex: Treating RN: 03-07-1928 (88 y.o. [name] Primary Care Provider: Seward Carol Other Clinician: Referring Provider: Treating Provider/Extender: Jennye Moccasin in Treatment: 0 Information Obtained From Patient Chart Eyes Complaints  and Symptoms: Positive for: Glasses / Contacts Ear/Nose/Mouth/Throat Complaints and Symptoms: Negative for: Chronic sinus problems or rhinitis Endocrine Complaints and Symptoms: Negative for: Heat/cold intolerance Integumentary (Skin) Complaints and Symptoms: Positive for: Wounds Psychiatric Complaints and Symptoms: Negative for: Claustrophobia; Suicidal Hematologic/Lymphatic Respiratory Medical History: Positive for: Chronic Obstructive Pulmonary Disease (COPD) Cardiovascular Medical History: Positive for: Congestive Heart Failure; Coronary Artery Disease; Hypertension Gastrointestinal Medical History: Positive for: Cirrhosis Genitourinary Medical History: Past Medical History Notes: BPH Immunological Neurologic Medical History: Positive for: Neuropathy Oncologic Immunizations Pneumococcal Vaccine: Received Pneumococcal Vaccination: No Implantable Devices None Family and Social History Cancer: No; Diabetes: No; Heart Disease: Yes - Father; Hereditary Spherocytosis: No; Hypertension: Yes - Father; Kidney Disease: No; Lung Disease: No; Seizures: No;  Stroke: Yes - Mother; Thyroid Problems: No; Tuberculosis: No; Former smoker; Marital Status - Widowed; Alcohol Use: Moderate; Drug Use: No History; Caffeine Use: Daily; Financial Concerns: No; Food, Clothing or Shelter Needs: No; Support System Lacking: No; Transportation Concerns: No Electronic Signature(s) Signed: 02/26/2022 5:11:12 PM By: Worthy Keeler PA-C Signed: 02/26/2022 5:21:51 PM By: Lorrin Jackson Entered By: Lorrin Jackson on 02/26/2022 13:44:17 -------------------------------------------------------------------------------- SuperBill Details Patient Name: Date of Service: Hector Beltran, [name] 02/26/2022 Medical Record Number: [id number] Patient Account Number: [id number] Date of Birth/Sex: Treating RN: 28-Feb-1928 (88 y.o. [name] Primary Care Provider: Seward Carol Other Clinician: Referring Provider: Treating Provider/Extender: Jennye Moccasin in Treatment: 0 Diagnosis Coding ICD-10 Codes Code Description [phone] Pressure ulcer of left heel, stage 3 Z96.642 Presence of left artificial hip joint I10 Essential (primary) hypertension I25.10 Atherosclerotic heart disease of native coronary artery without angina pectoris J44.9 Chronic obstructive pulmonary disease, unspecified I50.42 Chronic combined systolic (congestive) and diastolic (congestive) heart failure E44.0 Moderate protein-calorie malnutrition Facility Procedures CPT4 Code: [id number] Description: [place] VISIT-LEV 3 EST PT Modifier: 25 Quantity: 1 CPT4 Code: [id number] Description: [id number] - DEB SUBQ TISSUE 20 SQ CM/< ICD-10 Diagnosis Description L89.623 Pressure ulcer of left heel, stage 3 Modifier: Quantity: 1 Physician Procedures : CPT4 Code Description Modifier [id number] WC PHYS LEVEL 3 NEW PT 25 ICD-10 Diagnosis Description L89.623 Pressure ulcer of left heel, stage 3 Z96.642 Presence of left artificial hip joint I10 Essential (primary) hypertension I25.10  Atherosclerotic heart  disease of native coronary artery without angina pectoris Quantity: 1 : [id number] - WC PHYS SUBQ TISS 20 SQ CM ICD-10 Diagnosis Description L89.623 Pressure ulcer of left heel, stage 3 Quantity: 1 Electronic Signature(s) Signed: 02/26/2022 3:25:31 PM By: Worthy Keeler PA-C Previous Signature: 02/26/2022 2:49:51 PM Version By: Lorrin Jackson Entered By: Worthy Keeler on 02/26/2022 15:25:30

## 2022-02-26 NOTE — Progress Notes (Signed)
Hector, Beltran ([id number]) Visit Report for 02/26/2022 Abuse Risk Screen Details Patient Name: Date of Service: Hector Beltran 02/26/2022 1:15 PM Medical Record Number: [id number] Patient Account Number: [id number] Date of Birth/Sex: Treating RN: 06/22/1928 (88 y.o. Hector Beltran Primary Care Hector Beltran: Seward Carol Other Clinician: Referring Hector Beltran: Treating Nichol Ator/Extender: Hector Beltran in Treatment: 0 Abuse Risk Screen Items Answer ABUSE RISK SCREEN: Has anyone close to you tried to hurt or harm you recentlyo No Do you feel uncomfortable with anyone in your familyo No Has anyone forced you do things that you didnt want to doo No Electronic Signature(s) Signed: 02/26/2022 5:21:51 PM By: Lorrin Jackson Entered By: Lorrin Jackson on 02/26/2022 13:44:24 -------------------------------------------------------------------------------- Activities of Daily Living Details Patient Name: Date of ServiceLaurence Beltran 02/26/2022 1:15 PM Medical Record Number: [id number] Patient Account Number: [id number] Date of Birth/Sex: Treating RN: [birth year] (88 y.o. Hector Beltran Primary Care Hector Beltran: Seward Carol Other Clinician: Referring Hector Beltran: Treating Suzette Flagler/Extender: Hector Beltran in Treatment: 0 Activities of Daily Living Items Answer Activities of Daily Living (Please select one for each item) Drive Automobile Not Able T Medications ake Completely Able Use T elephone Completely Able Care for Appearance Completely Able Use T oilet Completely Able Bath / Shower Need Assistance Dress Self Need Assistance Feed Self Completely Able Walk Need Assistance Get In / Out Bed Completely [place] for Self Need Assistance Electronic Signature(s) Signed: 02/26/2022 5:21:51 PM By: Lorrin Jackson Entered By: Lorrin Jackson on 02/26/2022  13:45:48 -------------------------------------------------------------------------------- Education Screening Details Patient Name: Date of Service: Hector Beltran, [name] 02/26/2022 1:15 PM Medical Record Number: [id number] Patient Account Number: [id number] Date of Birth/Sex: Treating RN: 03-05-1928 (88 y.o. Hector Beltran Primary Care Nazar Beltran: Seward Carol Other Clinician: Referring Hector Beltran: Treating Hector Beltran/Extender: Hector Beltran in Treatment: 0 Primary Learner Assessed: Patient Learning Preferences/Education Level/Primary Language Learning Preference: Explanation, Demonstration, Printed Material Highest Education Level: College or Above Preferred Language: English Cognitive Barrier Language Barrier: No Translator Needed: No Memory Deficit: No Emotional Barrier: No Cultural/Religious Beliefs Affecting Medical Care: No Physical Barrier Impaired Vision: Yes Glasses Impaired Hearing: No Decreased Hand dexterity: No Knowledge/Comprehension Knowledge Level: High Comprehension Level: High Ability to understand written instructions: High Ability to understand verbal instructions: High Motivation Anxiety Level: Calm Cooperation: Cooperative Education Importance: Acknowledges Need Interest in Health Problems: Asks Questions Perception: Coherent Willingness to Engage in Self-Management High Activities: Readiness to Engage in Self-Management High Activities: Electronic Signature(s) Signed: 02/26/2022 5:21:51 PM By: Lorrin Jackson Entered By: Lorrin Jackson on 02/26/2022 13:46:17 -------------------------------------------------------------------------------- Fall Risk Assessment Details Patient Name: Date of Service: Hector Beltran, [name] 02/26/2022 1:15 PM Medical Record Number: [id number] Patient Account Number: [id number] Date of Birth/Sex: Treating RN: 05-24-1928 (88 y.o. Hector Beltran Primary Care Hector Beltran: Seward Carol Other  Clinician: Referring Hector Beltran: Treating Hector Beltran/Extender: Hector Beltran in Treatment: 0 Fall Risk Assessment Items Have you had 2 or more falls in the last 12 monthso 0 Yes Have you had any fall that resulted in injury in the last 12 monthso 0 Yes FALLS RISK SCREEN History of falling - immediate or within 3 months 25 Yes Secondary diagnosis (Do you have 2 or more medical diagnoseso) 0 No Ambulatory aid None/bed rest/wheelchair/nurse 0 No Crutches/cane/walker 15 Yes Furniture 0 No Intravenous therapy Access/Saline/Heparin Lock 0 No Gait/Transferring Normal/ bed rest/ wheelchair 0 No  Weak (short steps with or without shuffle, stooped but able to lift head while walking, may seek 10 Yes support from furniture) Impaired (short steps with shuffle, may have difficulty arising from chair, head down, impaired 0 No balance) Mental Status Oriented to own ability 0 Yes Electronic Signature(s) Signed: 02/26/2022 5:21:51 PM By: Lorrin Jackson Entered By: Lorrin Jackson on 02/26/2022 13:46:57 -------------------------------------------------------------------------------- Foot Assessment Details Patient Name: Date of Service: Hector Beltran, [name] 02/26/2022 1:15 PM Medical Record Number: [id number] Patient Account Number: [id number] Date of Birth/Sex: Treating RN: 08-11-1928 (88 y.o. Hector Beltran Primary Care Hector Beltran: Seward Carol Other Clinician: Referring Hector Beltran: Treating Hector Beltran/Extender: Hector Beltran in Treatment: 0 Foot Assessment Items Site Locations + = Sensation present, - = Sensation absent, C = Callus, U = Ulcer R = Redness, W = Warmth, M = Maceration, PU = Pre-ulcerative lesion F = Fissure, S = Swelling, D = Dryness Assessment Right: Left: Other Deformity: No No Prior Foot Ulcer: No No Prior Amputation: No No Charcot Joint: No No Ambulatory Status: Ambulatory With Help Assistance Device: Walker Gait:  Steady Electronic Signature(s) Signed: 02/26/2022 5:21:51 PM By: Lorrin Jackson Entered By: Lorrin Jackson on 02/26/2022 13:51:26 -------------------------------------------------------------------------------- Nutrition Risk Screening Details Patient Name: Date of ServiceLaurence Beltran 02/26/2022 1:15 PM Medical Record Number: [id number] Patient Account Number: [id number] Date of Birth/Sex: Treating RN: 1927/11/04 (87 y.o. Hector Beltran Primary Care Camia Dipinto: Seward Carol Other Clinician: Referring Trevelle Mcgurn: Treating Marshell Dilauro/Extender: Hector Beltran in Treatment: 0 Height (in): 75 Weight (lbs): 192 Body Mass Index (BMI): 24 Nutrition Risk Screening Items Score Screening NUTRITION RISK SCREEN: I have an illness or condition that made me change the kind and/or amount of food I eat 0 No I eat fewer than two meals per day 0 No I eat few fruits and vegetables, or milk products 0 No I have three or more drinks of beer, liquor or wine almost every day 0 No I have tooth or mouth problems that make it hard for me to eat 0 No I don't always have enough money to buy the food I need 0 No I eat alone most of the time 0 No I take three or more different prescribed or over-the-counter drugs a day 0 No Without wanting to, I have lost or gained 10 pounds in the last six months 0 No I am not always physically able to shop, cook and/or feed myself 0 No Nutrition Protocols Good Risk Protocol 0 No interventions needed Moderate Risk Protocol High Risk Proctocol Risk Level: Good Risk Score: 0 Electronic Signature(s) Signed: 02/26/2022 5:21:51 PM By: Lorrin Jackson Entered By: Lorrin Jackson on 02/26/2022 13:47:16
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
## 2022-03-05 ENCOUNTER — Encounter (HOSPITAL_BASED_OUTPATIENT_CLINIC_OR_DEPARTMENT_OTHER): Payer: Medicare PPO | Admitting: Physician Assistant

## 2022-03-05 DIAGNOSIS — I5042 Chronic combined systolic (congestive) and diastolic (congestive) heart failure: Secondary | ICD-10-CM | POA: Diagnosis not present

## 2022-03-05 DIAGNOSIS — Z87891 Personal history of nicotine dependence: Secondary | ICD-10-CM | POA: Diagnosis not present

## 2022-03-05 DIAGNOSIS — I110 Hypertensive heart disease with heart failure: Secondary | ICD-10-CM | POA: Diagnosis not present

## 2022-03-05 DIAGNOSIS — Z96642 Presence of left artificial hip joint: Secondary | ICD-10-CM | POA: Diagnosis not present

## 2022-03-05 DIAGNOSIS — Z6824 Body mass index (BMI) 24.0-24.9, adult: Secondary | ICD-10-CM | POA: Diagnosis not present

## 2022-03-05 DIAGNOSIS — L89623 Pressure ulcer of left heel, stage 3: Secondary | ICD-10-CM | POA: Diagnosis not present

## 2022-03-05 DIAGNOSIS — E440 Moderate protein-calorie malnutrition: Secondary | ICD-10-CM | POA: Diagnosis not present

## 2022-03-05 DIAGNOSIS — L97422 Non-pressure chronic ulcer of left heel and midfoot with fat layer exposed: Secondary | ICD-10-CM | POA: Diagnosis not present

## 2022-03-05 DIAGNOSIS — I2510 Atherosclerotic heart disease of native coronary artery without angina pectoris: Secondary | ICD-10-CM | POA: Diagnosis not present

## 2022-03-05 DIAGNOSIS — J449 Chronic obstructive pulmonary disease, unspecified: Secondary | ICD-10-CM | POA: Diagnosis not present

## 2022-03-05 NOTE — Progress Notes (Signed)
NELDON, SHEPARD ([id number]) Visit Report for 03/05/2022 Arrival Information Details Patient Name: Date of Service: Hector Beltran 03/05/2022 12:30 PM Medical Record Number: [id number] Patient Account Number: [id number] Date of Birth/Sex: Treating RN: Sep 29, 1927 (89 y.o. Hector Beltran Primary Care Jakson Delpilar: Renford Dills Other Clinician: Referring Alec Mcphee: Treating Shawn Dannenberg/Extender: Prudencio Burly in Treatment: 1 Visit Information History Since Last Visit Added or deleted any medications: No Patient Arrived: Wheel Chair Any new allergies or adverse reactions: No Arrival Time: 12:39 Had a fall or experienced change in No Accompanied By: Son, daughter in law activities of daily living that may affect Transfer Assistance: None risk of falls: Patient Identification Verified: Yes Signs or symptoms of abuse/neglect since last visito No Secondary Verification Process Completed: Yes Hospitalized since last visit: No Patient Requires Transmission-Based Precautions: No Implantable device outside of the clinic excluding No Patient Has Alerts: No cellular tissue based products placed in the center since last visit: Has Dressing in Place as Prescribed: Yes Pain Present Now: No Electronic Signature(s) Signed: 03/05/2022 4:35:31 PM By: Antonieta Iba Entered By: Antonieta Iba on 03/05/2022 12:44:55 -------------------------------------------------------------------------------- Encounter Discharge Information Details Patient Name: Date of Service: Hector Beltran, [name] 03/05/2022 12:30 PM Medical Record Number: [id number] Patient Account Number: [id number] Date of Birth/Sex: Treating RN: 02/08/1928 (88 y.o. Hector Beltran Primary Care Makeyla Govan: Renford Dills Other Clinician: Referring Rael Tilly: Treating Alyia Lacerte/Extender: Prudencio Burly in Treatment: 1 Encounter Discharge Information Items Post Procedure Vitals Discharge Condition:  Stable Temperature (F): 98 Ambulatory Status: Wheelchair Pulse (bpm): 88 Discharge Destination: Home Respiratory Rate (breaths/min): 20 Transportation: Private Auto Blood Pressure (mmHg): 146/74 Accompanied By: son, daughter in law Schedule Follow-up Appointment: Yes Clinical Summary of Care: Provided on 03/05/2022 Form Type Recipient Paper Patient Patient Electronic Signature(s) Signed: 03/05/2022 4:35:31 PM By: Antonieta Iba Entered By: Antonieta Iba on 03/05/2022 13:20:33 -------------------------------------------------------------------------------- Lower Extremity Assessment Details Patient Name: Date of Service: Hector Beltran, [name] 03/05/2022 12:30 PM Medical Record Number: [id number] Patient Account Number: [id number] Date of Birth/Sex: Treating RN: 1928/07/26 (88 y.o. Hector Beltran Primary Care Aarit Kashuba: Renford Dills Other Clinician: Referring Vikash Nest: Treating Norabelle Kondo/Extender: Prudencio Burly in Treatment: 1 Edema Assessment Assessed: Kyra Searles: Yes] [Right: No] Edema: [Left: Ye] [Right: s] Calf Left: Right: Point of Measurement: 33 cm From Medial Instep 35 cm Ankle Left: Right: Point of Measurement: 10 cm From Medial Instep 25 cm Vascular Assessment Pulses: Dorsalis Pedis Palpable: [Left:Yes] Electronic Signature(s) Signed: 03/05/2022 4:35:31 PM By: Antonieta Iba Entered By: Antonieta Iba on 03/05/2022 12:52:02 -------------------------------------------------------------------------------- Multi-Disciplinary Care Plan Details Patient Name: Date of Service: Hector Beltran, [name] 03/05/2022 12:30 PM Medical Record Number: [id number] Patient Account Number: [id number] Date of Birth/Sex: Treating RN: February 18, 1928 (88 y.o. Hector Beltran Primary Care Samella Lucchetti: Renford Dills Other Clinician: Referring Belma Dyches: Treating Mykel Mohl/Extender: Prudencio Burly in Treatment: 1 Active Inactive Abuse / Safety / Falls /  Self Care Management Nursing Diagnoses: History of Falls Goals: Patient will remain injury free related to falls Date Initiated: 02/26/2022 Target Resolution Date: 04/02/2022 Goal Status: Active Interventions: Assess fall risk on admission and as needed Assess impairment of mobility on admission and as needed per policy Notes: Wound/Skin Impairment Nursing Diagnoses: Impaired tissue integrity Goals: Patient/caregiver will verbalize understanding of skin care regimen Date Initiated: 02/26/2022 Target Resolution Date: 04/02/2022 Goal Status: Active Ulcer/skin breakdown will have a volume reduction of 30% by week 4 Date Initiated: 02/26/2022 Target Resolution Date:  04/02/2022 Goal Status: Active Interventions: Assess patient/caregiver ability to obtain necessary supplies Assess patient/caregiver ability to perform ulcer/skin care regimen upon admission and as needed Assess ulceration(s) every visit Provide education on ulcer and skin care Treatment Activities: Topical wound management initiated : 02/26/2022 Notes: Electronic Signature(s) Signed: 03/05/2022 4:35:31 PM By: Antonieta Iba Entered By: Antonieta Iba on 03/05/2022 12:38:55 -------------------------------------------------------------------------------- Pain Assessment Details Patient Name: Date of Service: Hector Beltran, [name] 03/05/2022 12:30 PM Medical Record Number: [id number] Patient Account Number: [id number] Date of Birth/Sex: Treating RN: [birth year] (87 y.o. Hector Beltran Primary Care Keisa Blow: Renford Dills Other Clinician: Referring Tish Begin: Treating Jovontae Banko/Extender: Prudencio Burly in Treatment: 1 Active Problems Location of Pain Severity and Description of Pain Patient Has Paino No Site Locations Pain Management and Medication Current Pain Management: Electronic Signature(s) Signed: 03/05/2022 4:35:31 PM By: Antonieta Iba Signed: 03/05/2022 4:35:31 PM By: Antonieta Iba Entered By: Antonieta Iba on 03/05/2022 12:51:38 -------------------------------------------------------------------------------- Patient/Caregiver Education Details Patient Name: Date of Service: Hector Beltran 6/21/2023andnbsp12:30 PM Medical Record Number: [id number] Patient Account Number: [id number] Date of Birth/Gender: Treating RN: [birth year] (88 y.o. Hector Beltran Primary Care Physician: Renford Dills Other Clinician: Referring Physician: Treating Physician/Extender: Prudencio Burly in Treatment: 1 Education Assessment Education Provided To: Patient Education Topics Provided Wound/Skin Impairment: Methods: Explain/Verbal, Printed Responses: State content correctly Electronic Signature(s) Signed: 03/05/2022 4:35:31 PM By: Antonieta Iba Entered By: Antonieta Iba on 03/05/2022 12:39:17 -------------------------------------------------------------------------------- Wound Assessment Details Patient Name: Date of Service: Hector Beltran, [name] 03/05/2022 12:30 PM Medical Record Number: [id number] Patient Account Number: [id number] Date of Birth/Sex: Treating RN: 01-May-1928 (88 y.o. Hector Beltran Primary Care Rajvi Armentor: Renford Dills Other Clinician: Referring Edwards Mckelvie: Treating Vinette Crites/Extender: Prudencio Burly in Treatment: 1 Wound Status Wound Number: 1 Primary Pressure Ulcer Etiology: Wound Location: Left Calcaneus Wound Open Wounding Event: Pressure Injury Status: Date Acquired: 01/08/2022 Comorbid Chronic Obstructive Pulmonary Disease (COPD), Congestive Weeks Of Treatment: 1 History: Heart Failure, Coronary Artery Disease, Hypertension, Cirrhosis , Clustered Wound: No Neuropathy Photos Wound Measurements Length: (cm) 3.2 Width: (cm) 2.7 Depth: (cm) 0.2 Area: (cm) 6.786 Volume: (cm) 1.357 % Reduction in Area: 4% % Reduction in Volume: 4% Epithelialization: None Tunneling: No Undermining:  No Wound Description Classification: Category/Stage III Wound Margin: Distinct, outline attached Exudate Amount: Medium Exudate Type: Serosanguineous Exudate Color: red, brown Foul Odor After Cleansing: No Slough/Fibrino Yes Wound Bed Granulation Amount: Medium (34-66%) Exposed Structure Granulation Quality: Red, Pink Fascia Exposed: No Necrotic Amount: Medium (34-66%) Fat Layer (Subcutaneous Tissue) Exposed: Yes Necrotic Quality: Adherent Slough Tendon Exposed: No Muscle Exposed: No Joint Exposed: No Bone Exposed: No Assessment Notes maceration Treatment Notes Wound #1 (Calcaneus) Wound Laterality: Left Cleanser Soap and Water Discharge Instruction: Wash with Dial Antibacterial Soap Peri-Wound Care Topical Primary Dressing Hydrofera Blue Ready Foam, 4x5 in Discharge Instruction: Apply to wound bed as instructed Secondary Dressing Woven Gauze Sponge, Non-Sterile 4x4 in Discharge Instruction: Apply over primary dressing as directed. Secured With [employer], 4.5x3.1 (in/yd) Discharge Instruction: Secure with Kerlix as directed. 60M Medipore H Soft Cloth Surgical T ape, 4 x 10 (in/yd) Discharge Instruction: Secure with tape as directed. Compression Wrap Compression Stockings Add-Ons Electronic Signature(s) Signed: 03/05/2022 4:35:31 PM By: Antonieta Iba Entered By: Antonieta Iba on 03/05/2022 13:05:29 -------------------------------------------------------------------------------- Vitals Details Patient Name: Date of Service: Hector Beltran, [name] 03/05/2022 12:30 PM Medical Record Number: [id number] Patient Account Number: [id number] Date of Birth/Sex: Treating RN: Aug 08, 1928 (  88 y.o. [name] Primary Care Cassian Torelli: Other Clinician: Seward Carol Referring Gail Creekmore: Treating Kewon Statler/Extender: Jennye Moccasin in Treatment: 1 Vital Signs Time Taken: 12:47 Temperature (F): 98 Height (in): 75 Pulse (bpm): 88 Weight  (lbs): 192 Respiratory Rate (breaths/min): 20 Body Mass Index (BMI): 24 Blood Pressure (mmHg): 146/74 Reference Range: 80 - 120 mg / dl Airway Pulse Oximetry (%): 90 Electronic Signature(s) Signed: 03/05/2022 4:35:31 PM By: Lorrin Jackson Entered By: Lorrin Jackson on 03/05/2022 12:47:53

## 2022-03-05 NOTE — Progress Notes (Addendum)
YOANDY, PARR ([id number]) Visit Report for 03/05/2022 Chief Complaint Document Details Patient Name: Date of Service: [name] 03/05/2022 12:30 PM Medical Record Number: [id number] Patient Account Number: [id number] Date of Birth/Sex: Treating RN: December 31, 1927 (88 y.o. [name] Primary Care Provider: Seward Carol Other Clinician: Referring Provider: Treating Provider/Extender: Jennye Moccasin in Treatment: 1 Information Obtained from: Patient Chief Complaint Pressure ulcer left heel Electronic Signature(s) Signed: 03/05/2022 12:52:27 PM By: Worthy Keeler PA-C Entered By: Worthy Keeler on 03/05/2022 12:52:27 -------------------------------------------------------------------------------- Debridement Details Patient Name: Date of Service: [name], [name] 03/05/2022 12:30 PM Medical Record Number: [id number] Patient Account Number: [id number] Date of Birth/Sex: Treating RN: 03-31-1928 (88 y.o. [name] Primary Care Provider: Seward Carol Other Clinician: Referring Provider: Treating Provider/Extender: Jennye Moccasin in Treatment: 1 Debridement Performed for Assessment: Wound #1 Left Calcaneus Performed By: Physician Worthy Keeler, PA Debridement Type: Debridement Level of Consciousness (Pre-procedure): Awake and Alert Pre-procedure Verification/Time Out Yes - 13:02 Taken: Start Time: 13:03 Pain Control: Other : Benzocaine T Area Debrided (L x W): otal 3.2 (cm) x 2.7 (cm) = 8.64 (cm) Tissue and other material debrided: Non-Viable, Slough, Subcutaneous, Biofilm, Slough Level: Skin/Subcutaneous Tissue Debridement Description: Excisional Instrument: Curette Bleeding: Minimum Hemostasis Achieved: Pressure End Time: 13:07 Response to Treatment: Procedure was tolerated well Level of Consciousness (Post- Awake and Alert procedure): Post Debridement Measurements of Total Wound Length: (cm) 3.2 Stage:  Category/Stage III Width: (cm) 2.7 Depth: (cm) 0.2 Volume: (cm) 1.357 Character of Wound/Ulcer Post Debridement: Stable Post Procedure Diagnosis Same as Pre-procedure Electronic Signature(s) Signed: 03/05/2022 4:35:31 PM By: Lorrin Jackson Signed: 03/05/2022 5:09:34 PM By: Worthy Keeler PA-C Entered By: Lorrin Jackson on 03/05/2022 13:07:39 -------------------------------------------------------------------------------- HPI Details Patient Name: Date of Service: [name], [name] 03/05/2022 12:30 PM Medical Record Number: [id number] Patient Account Number: [id number] Date of Birth/Sex: Treating RN: 04-06-1928 (88 y.o. [name] Primary Care Provider: Seward Carol Other Clinician: Referring Provider: Treating Provider/Extender: Jennye Moccasin in Treatment: 1 History of Present Illness HPI Description: 02-26-2022 upon evaluation today patient presents for initial inspection here in our clinic concerning issues that he has been having with his left heel he has a pressure ulcer that ensued following having fallen and fractured his hip. This actually occurred in April and he had surgery for total hip replacement on the left of December 24, 2021. He was discharged to a SNF at the point of leaving the hospital and it sometime during the hospital stay and SNF stay he developed a pressure ulcer to his left heel. At this point it does not seem to be getting any worse he is at [facility] independent living and they need orders to know what to do. Right now they do think that they can do the dressing changes but he needs to have the supplies and the recommendations on hand. He did have an x-ray performed on 02-13-2022 which was negative for osteomyelitis this is good news right now they just been using a dry dressing and that is good. Patient does have a history of a left hip replacement total, hypertension, coronary artery disease, COPD, congestive heart failure, and  protein calorie malnutrition as noted by low serum albumin and protein. 03-05-2022 upon evaluation today patient appears to be doing well currently in regard to his heel. Unfortunately the dressing that we put on is the one that came back in with. The  facility which was supposed to be changing this for him we got all the supplies for them but they never actually changed the dressing. With that being said this obviously is frustrating for the family who states they are definitely can reach out to the facility regard. They will get that straightened out. Electronic Signature(s) Signed: 03/05/2022 5:08:20 PM By: Worthy Keeler PA-C Entered By: Worthy Keeler on 03/05/2022 17:08:19 -------------------------------------------------------------------------------- Physical Exam Details Patient Name: Date of Service: [name] 03/05/2022 12:30 PM Medical Record Number: [id number] Patient Account Number: [id number] Date of Birth/Sex: Treating RN: Dec 18, 1927 (88 y.o. [name] Primary Care Provider: Seward Carol Other Clinician: Referring Provider: Treating Provider/Extender: Jennye Moccasin in Treatment: 1 Constitutional Well-nourished and well-hydrated in no acute distress. Respiratory normal breathing without difficulty. Psychiatric this patient is able to make decisions and demonstrates good insight into disease process. Alert and Oriented x 3. pleasant and cooperative. Notes Upon inspection patient's wound bed actually showed signs of good granulation and epithelization at this point. Fortunately I do not see any signs of infection locally or systemically which is great news and overall I am extremely pleased with where we stand today. Electronic Signature(s) Signed: 03/05/2022 5:08:32 PM By: Worthy Keeler PA-C Entered By: Worthy Keeler on 03/05/2022 17:08:32 -------------------------------------------------------------------------------- Physician  Orders Details Patient Name: Date of Service: [name], [name] 03/05/2022 12:30 PM Medical Record Number: [id number] Patient Account Number: [id number] Date of Birth/Sex: Treating RN: 1928/03/17 (88 y.o. [name] Primary Care Provider: Seward Carol Other Clinician: Referring Provider: Treating Provider/Extender: Jennye Moccasin in Treatment: 1 Verbal / Phone Orders: No Diagnosis Coding ICD-10 Coding Code Description [phone] Pressure ulcer of left heel, stage 3 Z96.642 Presence of left artificial hip joint I10 Essential (primary) hypertension I25.10 Atherosclerotic heart disease of native coronary artery without angina pectoris J44.9 Chronic obstructive pulmonary disease, unspecified I50.42 Chronic combined systolic (congestive) and diastolic (congestive) heart failure E44.0 Moderate protein-calorie malnutrition Follow-up Appointments ppointment in 1 week. - 03/12/22 @ 2:45pm with Orland Jarred, RN (Room 7) Return A 03/19/22 @ 10:15am 03/26/22 @ 2:45pm Bathing/ Shower/ Hygiene May shower with protection but do not get wound dressing(s) wet. - May use cast protector bag from [employer], [place] or [facility] on days dressing not being changed. May shower and wash wound with soap and water. - May wash with antibacterial soap when changing dressing. Edema Control - Lymphedema / SCD / Other Elevate legs to the level of the heart or above for 30 minutes daily and/or when sitting, a frequency of: - throughout the day Avoid standing for long periods of time. Off-Loading Other: - Avoid/Limit pressure to heel. Recommend wearing a sandal that does not rub heel area. Additional Orders / Instructions Follow Nutritious Diet - High Protein Diet: 100g of Protein daily Home Health New wound care orders this week; continue Home Health for wound care. May utilize formulary equivalent dressing for wound treatment orders unless otherwise specified. - Skilled nursing for wound  care/dressing changes 3x week. Other Home Health Orders/Instructions: - [facility] Wound Treatment Wound #1 - Calcaneus Wound Laterality: Left Cleanser: Soap and Water (Home Health) 3 x Per Week/30 Days Discharge Instructions: Wash with Dial Antibacterial Soap Prim Dressing: Hydrofera Blue Ready Foam, 4x5 in (Home Health) (Generic) 3 x Per Week/30 Days ary Discharge Instructions: Apply to wound bed as instructed Secondary Dressing: Woven Gauze Sponge, Non-Sterile 4x4 in (Home Health) (Generic) 3 x Per Week/30  Days Discharge Instructions: Apply over primary dressing as directed. Secured With: [employer], 4.5x3.1 (in/yd) (Home Health) (Generic) 3 x Per Week/30 Days Discharge Instructions: Secure with Kerlix as directed. Secured With: 69M Medipore H Soft Cloth Surgical T ape, 4 x 10 (in/yd) (Home Health) (Generic) 3 x Per Week/30 Days Discharge Instructions: Secure with tape as directed. Electronic Signature(s) Signed: 03/06/2022 4:43:05 PM By: Lorrin Jackson Signed: 05/14/2022 6:04:39 PM By: Worthy Keeler PA-C Previous Signature: 03/05/2022 4:35:31 PM Version By: Lorrin Jackson Previous Signature: 03/05/2022 5:09:34 PM Version By: Worthy Keeler PA-C Entered By: Lorrin Jackson on 03/06/2022 14:39:10 -------------------------------------------------------------------------------- Problem List Details Patient Name: Date of Service: [name], [name] 03/05/2022 12:30 PM Medical Record Number: [id number] Patient Account Number: [id number] Date of Birth/Sex: Treating RN: 08/25/1928 (87 y.o. [name] Primary Care Provider: Seward Carol Other Clinician: Referring Provider: Treating Provider/Extender: Jennye Moccasin in Treatment: 1 Active Problems ICD-10 Encounter Code Description Active Date MDM Diagnosis [phone] Pressure ulcer of left heel, stage 3 02/26/2022 No Yes Z96.642 Presence of left artificial hip joint 02/26/2022 No Yes I10 Essential  (primary) hypertension 02/26/2022 No Yes I25.10 Atherosclerotic heart disease of native coronary artery without angina pectoris 02/26/2022 No Yes J44.9 Chronic obstructive pulmonary disease, unspecified 02/26/2022 No Yes I50.42 Chronic combined systolic (congestive) and diastolic (congestive) heart failure 02/26/2022 No Yes E44.0 Moderate protein-calorie malnutrition 02/26/2022 No Yes Inactive Problems Resolved Problems Electronic Signature(s) Signed: 03/05/2022 12:52:17 PM By: Worthy Keeler PA-C Entered By: Worthy Keeler on 03/05/2022 12:52:17 -------------------------------------------------------------------------------- Progress Note Details Patient Name: Date of Service: [name], [name] 03/05/2022 12:30 PM Medical Record Number: [id number] Patient Account Number: [id number] Date of Birth/Sex: Treating RN: Jun 13, 1928 (88 y.o. [name] Primary Care Provider: Seward Carol Other Clinician: Referring Provider: Treating Provider/Extender: Jennye Moccasin in Treatment: 1 Subjective Chief Complaint Information obtained from Patient Pressure ulcer left heel History of Present Illness (HPI) 02-26-2022 upon evaluation today patient presents for initial inspection here in our clinic concerning issues that he has been having with his left heel he has a pressure ulcer that ensued following having fallen and fractured his hip. This actually occurred in April and he had surgery for total hip replacement on the left of December 24, 2021. He was discharged to a SNF at the point of leaving the hospital and it sometime during the hospital stay and SNF stay he developed a pressure ulcer to his left heel. At this point it does not seem to be getting any worse he is at [facility] independent living and they need orders to know what to do. Right now they do think that they can do the dressing changes but he needs to have the supplies and the recommendations on hand. He did  have an x-ray performed on 02-13-2022 which was negative for osteomyelitis this is good news right now they just been using a dry dressing and that is good. Patient does have a history of a left hip replacement total, hypertension, coronary artery disease, COPD, congestive heart failure, and protein calorie malnutrition as noted by low serum albumin and protein. 03-05-2022 upon evaluation today patient appears to be doing well currently in regard to his heel. Unfortunately the dressing that we put on is the one that came back in with. The facility which was supposed to be changing this for him we got all the supplies for them but they never actually changed the dressing. With that  being said this obviously is frustrating for the family who states they are definitely can reach out to the facility regard. They will get that straightened out. Objective Constitutional Well-nourished and well-hydrated in no acute distress. Vitals Time Taken: 12:47 PM, Height: 75 in, Weight: 192 lbs, BMI: 24, Temperature: 98 F, Pulse: 88 bpm, Respiratory Rate: 20 breaths/min, Blood Pressure: 146/74 mmHg, Pulse Oximetry: 90 %. Respiratory normal breathing without difficulty. Psychiatric this patient is able to make decisions and demonstrates good insight into disease process. Alert and Oriented x 3. pleasant and cooperative. General Notes: Upon inspection patient's wound bed actually showed signs of good granulation and epithelization at this point. Fortunately I do not see any signs of infection locally or systemically which is great news and overall I am extremely pleased with where we stand today. Integumentary (Hair, Skin) Wound #1 status is Open. Original cause of wound was Pressure Injury. The date acquired was: 01/08/2022. The wound has been in treatment 1 weeks. The wound is located on the Left Calcaneus. The wound measures 3.2cm length x 2.7cm width x 0.2cm depth; 6.786cm^2 area and 1.357cm^3 volume. There is  Fat Layer (Subcutaneous Tissue) exposed. There is no tunneling or undermining noted. There is a medium amount of serosanguineous drainage noted. The wound margin is distinct with the outline attached to the wound base. There is medium (34-66%) red, pink granulation within the wound bed. There is a medium (34-66%) amount of necrotic tissue within the wound bed including Adherent Slough. 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 There was a Excisional Skin/Subcutaneous Tissue Debridement with a total area of 8.64 sq cm performed by Lenda Kelp, PA. With the following instrument(s): Curette to remove Non-Viable tissue/material. Material removed includes Subcutaneous Tissue, Slough, and Biofilm after achieving pain control using Other (Benzocaine). No specimens were taken. A time out was conducted at 13:02, prior to the start of the procedure. A Minimum amount of bleeding was controlled with Pressure. The procedure was tolerated well. Post Debridement Measurements: 3.2cm length x 2.7cm width x 0.2cm depth; 1.357cm^3 volume. Post debridement Stage noted as Category/Stage III. Character of Wound/Ulcer Post Debridement is stable. Post procedure Diagnosis Wound #1: Same as Pre-Procedure Plan Follow-up Appointments: Return Appointment in 1 week. - 03/12/22 @ 2:45pm with Shelda Altes, RN (Room 7) 03/19/22 @ 10:15am 03/26/22 @ 2:45pm Bathing/ Shower/ Hygiene: May shower with protection but do not get wound dressing(s) wet. -  May use cast protector bag from [employer], [facility] or [facility] on days dressing not being changed. May shower and wash wound with soap and water. - May wash with antibacterial soap when changing dressing. Edema Control - Lymphedema / SCD / Other: Elevate legs to the level of the heart or above for 30 minutes daily and/or when sitting, a frequency of: - throughout the day Avoid standing for long periods of time. Off-Loading: Other: - Avoid/Limit pressure to heel. Recommend wearing a sandal that does not rub heel area. Additional Orders / Instructions: Follow Nutritious Diet - High Protein Diet: 100g of Protein daily WOUND #1: - Calcaneus Wound Laterality: Left Cleanser: Soap and Water 3 x Per Week/30 Days Discharge Instructions:  Wash with Dial Antibacterial Soap Prim Dressing: Hydrofera Blue Ready Foam, 4x5 in (Generic) 3 x Per Week/30 Days ary Discharge Instructions: Apply to wound bed as instructed Secondary Dressing: Woven Gauze Sponge, Non-Sterile 4x4 in (Generic) 3 x Per Week/30 Days Discharge Instructions: Apply over primary dressing as directed. Secured With: [employer], 4.5x3.1 (in/yd) (Generic) 3 x Per Week/30 Days Discharge Instructions: Secure with Kerlix as directed. Secured With: 61M Medipore H Soft Cloth Surgical T ape, 4 x 10 (in/yd) (Generic) 3 x Per Week/30 Days Discharge Instructions: Secure with tape as directed. 1. I would recommend that we go ahead and continue with the [facility] dressing I think this is still to be a good way to go. 2. Postdebridement the wound bed is much better and overall I think coupled with the bordered foam dressing to cover he should really do quite well as far as offloading I see no evidence of new injury which is great news as well. We will see patient back for reevaluation in 1 week here in the clinic. If anything worsens or changes patient will contact our office for additional recommendations. Electronic Signature(s) Signed:  03/05/2022 5:08:56 PM By: Lenda Kelp PA-C Entered By: Lenda Kelp on 03/05/2022 17:08:56 -------------------------------------------------------------------------------- SuperBill Details Patient Name: Date of Service: [name], [name] 03/05/2022 Medical Record Number: [id number] Patient Account Number: [id number] Date of Birth/Sex: Treating RN: [birth year] (88 y.o. Lytle Michaels Primary Care Provider: Renford Dills Other Clinician: Referring Provider: Treating Provider/Extender: Prudencio Burly in Treatment: 1 Diagnosis Coding ICD-10 Codes Code Description [phone] Pressure ulcer of left heel, stage 3 Z96.642 Presence of left artificial hip joint I10 Essential (primary) hypertension I25.10 Atherosclerotic heart disease of native coronary artery without angina pectoris J44.9 Chronic obstructive pulmonary disease, unspecified I50.42 Chronic combined systolic (congestive) and diastolic (congestive) heart failure E44.0 Moderate protein-calorie malnutrition Facility Procedures CPT4 Code: [id number] Description: 11042 - DEB SUBQ TISSUE 20 SQ CM/< ICD-10 Diagnosis Description L89.623 Pressure ulcer of left heel, stage 3 Modifier: Quantity: 1 Physician Procedures : CPT4 Code Description Modifier [id number] 11042 - WC PHYS SUBQ TISS 20 SQ CM ICD-10 Diagnosis Description L89.623 Pressure ulcer of left heel, stage 3 Quantity: 1 Electronic Signature(s) Signed: 03/05/2022 5:09:09 PM By: Lenda Kelp PA-C Previous Signature: 03/05/2022 4:35:31 PM Version By: Antonieta Iba Entered By: Lenda Kelp on 03/05/2022 17:09:08

## 2022-03-07 ENCOUNTER — Ambulatory Visit: Payer: Medicare PPO | Admitting: Internal Medicine

## 2022-03-07 ENCOUNTER — Encounter: Payer: Self-pay | Admitting: Internal Medicine

## 2022-03-07 VITALS — BP 120/70 | HR 70 | Ht 75.0 in | Wt 199.0 lb

## 2022-03-07 DIAGNOSIS — I471 Supraventricular tachycardia, unspecified: Secondary | ICD-10-CM

## 2022-03-07 DIAGNOSIS — J449 Chronic obstructive pulmonary disease, unspecified: Secondary | ICD-10-CM

## 2022-03-07 DIAGNOSIS — I2510 Atherosclerotic heart disease of native coronary artery without angina pectoris: Secondary | ICD-10-CM | POA: Diagnosis not present

## 2022-03-07 DIAGNOSIS — I700 Atherosclerosis of aorta: Secondary | ICD-10-CM | POA: Diagnosis not present

## 2022-03-12 ENCOUNTER — Encounter (HOSPITAL_BASED_OUTPATIENT_CLINIC_OR_DEPARTMENT_OTHER): Payer: Medicare PPO | Admitting: Physician Assistant

## 2022-03-12 DIAGNOSIS — Z96642 Presence of left artificial hip joint: Secondary | ICD-10-CM | POA: Diagnosis not present

## 2022-03-12 DIAGNOSIS — I2510 Atherosclerotic heart disease of native coronary artery without angina pectoris: Secondary | ICD-10-CM | POA: Diagnosis not present

## 2022-03-12 DIAGNOSIS — R262 Difficulty in walking, not elsewhere classified: Secondary | ICD-10-CM | POA: Diagnosis not present

## 2022-03-12 DIAGNOSIS — I110 Hypertensive heart disease with heart failure: Secondary | ICD-10-CM | POA: Diagnosis not present

## 2022-03-12 DIAGNOSIS — L89623 Pressure ulcer of left heel, stage 3: Secondary | ICD-10-CM | POA: Diagnosis not present

## 2022-03-12 DIAGNOSIS — S72002D Fracture of unspecified part of neck of left femur, subsequent encounter for closed fracture with routine healing: Secondary | ICD-10-CM | POA: Diagnosis not present

## 2022-03-12 DIAGNOSIS — Z87891 Personal history of nicotine dependence: Secondary | ICD-10-CM | POA: Diagnosis not present

## 2022-03-12 DIAGNOSIS — I5042 Chronic combined systolic (congestive) and diastolic (congestive) heart failure: Secondary | ICD-10-CM | POA: Diagnosis not present

## 2022-03-12 DIAGNOSIS — Z6824 Body mass index (BMI) 24.0-24.9, adult: Secondary | ICD-10-CM | POA: Diagnosis not present

## 2022-03-12 DIAGNOSIS — L97422 Non-pressure chronic ulcer of left heel and midfoot with fat layer exposed: Secondary | ICD-10-CM | POA: Diagnosis not present

## 2022-03-12 DIAGNOSIS — M6281 Muscle weakness (generalized): Secondary | ICD-10-CM | POA: Diagnosis not present

## 2022-03-12 DIAGNOSIS — J449 Chronic obstructive pulmonary disease, unspecified: Secondary | ICD-10-CM | POA: Diagnosis not present

## 2022-03-12 DIAGNOSIS — Z9181 History of falling: Secondary | ICD-10-CM | POA: Diagnosis not present

## 2022-03-12 DIAGNOSIS — E440 Moderate protein-calorie malnutrition: Secondary | ICD-10-CM | POA: Diagnosis not present

## 2022-03-13 DIAGNOSIS — M6281 Muscle weakness (generalized): Secondary | ICD-10-CM | POA: Diagnosis not present

## 2022-03-13 DIAGNOSIS — Z96642 Presence of left artificial hip joint: Secondary | ICD-10-CM | POA: Diagnosis not present

## 2022-03-13 DIAGNOSIS — S72002D Fracture of unspecified part of neck of left femur, subsequent encounter for closed fracture with routine healing: Secondary | ICD-10-CM | POA: Diagnosis not present

## 2022-03-13 DIAGNOSIS — Z9181 History of falling: Secondary | ICD-10-CM | POA: Diagnosis not present

## 2022-03-13 DIAGNOSIS — R262 Difficulty in walking, not elsewhere classified: Secondary | ICD-10-CM | POA: Diagnosis not present

## 2022-03-14 DIAGNOSIS — Z9181 History of falling: Secondary | ICD-10-CM | POA: Diagnosis not present

## 2022-03-14 DIAGNOSIS — R262 Difficulty in walking, not elsewhere classified: Secondary | ICD-10-CM | POA: Diagnosis not present

## 2022-03-14 DIAGNOSIS — S72002D Fracture of unspecified part of neck of left femur, subsequent encounter for closed fracture with routine healing: Secondary | ICD-10-CM | POA: Diagnosis not present

## 2022-03-14 DIAGNOSIS — Z96642 Presence of left artificial hip joint: Secondary | ICD-10-CM | POA: Diagnosis not present

## 2022-03-14 DIAGNOSIS — M6281 Muscle weakness (generalized): Secondary | ICD-10-CM | POA: Diagnosis not present

## 2022-03-18 DIAGNOSIS — M6281 Muscle weakness (generalized): Secondary | ICD-10-CM | POA: Diagnosis not present

## 2022-03-18 DIAGNOSIS — Z9181 History of falling: Secondary | ICD-10-CM | POA: Diagnosis not present

## 2022-03-18 DIAGNOSIS — Z96642 Presence of left artificial hip joint: Secondary | ICD-10-CM | POA: Diagnosis not present

## 2022-03-18 DIAGNOSIS — S72002D Fracture of unspecified part of neck of left femur, subsequent encounter for closed fracture with routine healing: Secondary | ICD-10-CM | POA: Diagnosis not present

## 2022-03-18 DIAGNOSIS — R262 Difficulty in walking, not elsewhere classified: Secondary | ICD-10-CM | POA: Diagnosis not present

## 2022-03-19 ENCOUNTER — Encounter (HOSPITAL_BASED_OUTPATIENT_CLINIC_OR_DEPARTMENT_OTHER): Payer: Medicare PPO | Attending: Physician Assistant | Admitting: Physician Assistant

## 2022-03-19 DIAGNOSIS — E440 Moderate protein-calorie malnutrition: Secondary | ICD-10-CM | POA: Diagnosis not present

## 2022-03-19 DIAGNOSIS — Z6825 Body mass index (BMI) 25.0-25.9, adult: Secondary | ICD-10-CM | POA: Insufficient documentation

## 2022-03-19 DIAGNOSIS — I2510 Atherosclerotic heart disease of native coronary artery without angina pectoris: Secondary | ICD-10-CM | POA: Diagnosis not present

## 2022-03-19 DIAGNOSIS — L89623 Pressure ulcer of left heel, stage 3: Secondary | ICD-10-CM | POA: Diagnosis not present

## 2022-03-19 DIAGNOSIS — I110 Hypertensive heart disease with heart failure: Secondary | ICD-10-CM | POA: Insufficient documentation

## 2022-03-19 DIAGNOSIS — J449 Chronic obstructive pulmonary disease, unspecified: Secondary | ICD-10-CM | POA: Diagnosis not present

## 2022-03-19 DIAGNOSIS — Z96642 Presence of left artificial hip joint: Secondary | ICD-10-CM | POA: Insufficient documentation

## 2022-03-19 DIAGNOSIS — I5042 Chronic combined systolic (congestive) and diastolic (congestive) heart failure: Secondary | ICD-10-CM | POA: Diagnosis not present

## 2022-03-19 NOTE — Progress Notes (Addendum)
ESTEPHAN, GALLARDO ([id number]) Visit Report for 03/19/2022 Chief Complaint Document Details Patient Name: Date of Service: Hector Beltran 03/19/2022 10:15 A M Medical Record Number: [id number] Patient Account Number: [id number] Date of Birth/Sex: Treating RN: Apr 20, 1928 (88 y.o. Lytle Michaels Primary Care Provider: Renford Dills Other Clinician: Referring Provider: Treating Provider/Extender: Prudencio Burly in Treatment: 3 Information Obtained from: Patient Chief Complaint Pressure ulcer left heel Electronic Signature(s) Signed: 03/19/2022 11:30:52 AM By: Lenda Kelp PA-C Entered By: Lenda Kelp on 03/19/2022 11:30:52 -------------------------------------------------------------------------------- Debridement Details Patient Name: Date of Service: Hector Beltran, [name] 03/19/2022 10:15 A M Medical Record Number: [id number] Patient Account Number: [id number] Date of Birth/Sex: Treating RN: 31-Dec-1927 (88 y.o. Lytle Michaels Primary Care Provider: Renford Dills Other Clinician: Referring Provider: Treating Provider/Extender: Prudencio Burly in Treatment: 3 Debridement Performed for Assessment: Wound #1 Left Calcaneus Performed By: Physician Lenda Kelp, PA Debridement Type: Debridement Level of Consciousness (Pre-procedure): Awake and Alert Pre-procedure Verification/Time Out Yes - 11:29 Taken: Start Time: 11:30 Pain Control: Lidocaine 4% T opical Solution T Area Debrided (L x W): otal 2.7 (cm) x 3 (cm) = 8.1 (cm) Tissue and other material debrided: Non-Viable, Slough, Subcutaneous, Biofilm, Slough Level: Skin/Subcutaneous Tissue Debridement Description: Excisional Instrument: Curette Bleeding: Minimum Hemostasis Achieved: Pressure End Time: 11:34 Response to Treatment: Procedure was tolerated well Level of Consciousness (Post- Awake and Alert procedure): Post Debridement Measurements of Total Wound Length: (cm)  2.7 Stage: Category/Stage III Width: (cm) 3 Depth: (cm) 0.2 Volume: (cm) 1.272 Character of Wound/Ulcer Post Debridement: Stable Post Procedure Diagnosis Same as Pre-procedure Electronic Signature(s) Signed: 03/19/2022 4:22:37 PM By: Lenda Kelp PA-C Signed: 03/19/2022 5:13:46 PM By: Antonieta Iba Entered By: Antonieta Iba on 03/19/2022 11:35:04 -------------------------------------------------------------------------------- HPI Details Patient Name: Date of Service: Hector Beltran, [name] 03/19/2022 10:15 A M Medical Record Number: [id number] Patient Account Number: [id number] Date of Birth/Sex: Treating RN: 12/12/1927 (88 y.o. Lytle Michaels Primary Care Provider: Renford Dills Other Clinician: Referring Provider: Treating Provider/Extender: Prudencio Burly in Treatment: 3 History of Present Illness HPI Description: 02-26-2022 upon evaluation today patient presents for initial inspection here in our clinic concerning issues that he has been having with his left heel he has a pressure ulcer that ensued following having fallen and fractured his hip. This actually occurred in April and he had surgery for total hip replacement on the left of December 24, 2021. He was discharged to a SNF at the point of leaving the hospital and it sometime during the hospital stay and SNF stay he developed a pressure ulcer to his left heel. At this point it does not seem to be getting any worse he is at [facility] independent living and they need orders to know what to do. Right now they do think that they can do the dressing changes but he needs to have the supplies and the recommendations on hand. He did have an x-ray performed on 02-13-2022 which was negative for osteomyelitis this is good news right now they just been using a dry dressing and that is good. Patient does have a history of a left hip replacement total, hypertension, coronary artery disease, COPD, congestive heart failure,  and protein calorie malnutrition as noted by low serum albumin and protein. 03-05-2022 upon evaluation today patient appears to be doing well currently in regard to his heel. Unfortunately the dressing that we put on is the one that  came back in with. The facility which was supposed to be changing this for him we got all the supplies for them but they never actually changed the dressing. With that being said this obviously is frustrating for the family who states they are definitely can reach out to the facility regard. They will get that straightened out. 03-12-2022 upon evaluation today patient appears to be doing well with regard to his wound although he still is not getting this changed through the week. He is pretty much staying off only put on 20 gets back. Obviously this is not the way we wanted to proceed with this but nonetheless we had a hard time getting home health out to do what they needed to do. We are going to follow-up with a phone call and try to see what the deal is as far as them receiving our orders. We faxed them but unfortunately they do not seem to get them they say to see if there is a different fax number. 03-19-2022 upon evaluation today patient appears to be doing well currently in regard to his wound. In fact this has some slough buildup but seems to be doing quite well. I do not see any evidence of active infection at this time which is great news. No fevers, chills, nausea, vomiting, or diarrhea. Electronic Signature(s) Signed: 03/19/2022 1:15:41 PM By: Worthy Keeler PA-C Entered By: Worthy Keeler on 03/19/2022 13:15:41 -------------------------------------------------------------------------------- Physical Exam Details Patient Name: Date of Service: Hector Beltran 03/19/2022 10:15 A M Medical Record Number: [id number] Patient Account Number: [id number] Date of Birth/Sex: Treating RN: 12-Apr-1928 (88 y.o. [name] Primary Care Provider: Seward Carol Other  Clinician: Referring Provider: Treating Provider/Extender: Jennye Moccasin in Treatment: 3 Constitutional Well-nourished and well-hydrated in no acute distress. Respiratory normal breathing without difficulty. Psychiatric this patient is able to make decisions and demonstrates good insight into disease process. Alert and Oriented x 3. pleasant and cooperative. Notes Patient's wound bed showed evidence of good granulation and epithelization at this point. Fortunately I do not see any signs of infection locally or systemically which is great news and overall I am extremely pleased with where we stand. I did perform debridement of clearway the necrotic debris tolerated that without complication postdebridement the wound bed appears to be doing much better. Electronic Signature(s) Signed: 03/19/2022 1:16:03 PM By: Worthy Keeler PA-C Entered By: Worthy Keeler on 03/19/2022 13:16:02 -------------------------------------------------------------------------------- Physician Orders Details Patient Name: Date of Service: [name], [name] 03/19/2022 10:15 A M Medical Record Number: [id number] Patient Account Number: [id number] Date of Birth/Sex: Treating RN: [birth year] (88 y.o. [name] Primary Care Provider: Seward Carol Other Clinician: Referring Provider: Treating Provider/Extender: Jennye Moccasin in Treatment: 3 Verbal / Phone Orders: No Diagnosis Coding ICD-10 Coding Code Description [phone] Pressure ulcer of left heel, stage 3 Z96.642 Presence of left artificial hip joint I10 Essential (primary) hypertension I25.10 Atherosclerotic heart disease of native coronary artery without angina pectoris J44.9 Chronic obstructive pulmonary disease, unspecified I50.42 Chronic combined systolic (congestive) and diastolic (congestive) heart failure E44.0 Moderate protein-calorie malnutrition Follow-up Appointments ppointment in 1 week. -  03/26/22 @ 2:45pm with Orland Jarred, RN (Room 7) Return A ppointment in 2 weeks. - 04/02/22 @ 2:45pm with Orland Jarred, RN (Room 7) Return A Bathing/ Shower/ Hygiene May shower with protection but do not get wound dressing(s) wet. - May use cast protector bag from [employer], [employer] or  [facility] on days dressing not being changed. May shower and wash wound with soap and water. - May wash with antibacterial soap when changing dressing. Edema Control - Lymphedema / SCD / Other Elevate legs to the level of the heart or above for 30 minutes daily and/or when sitting, a frequency of: - throughout the day Avoid standing for long periods of time. Off-Loading Other: - Avoid/Limit pressure to heel. Recommend wearing a sandal that does not rub heel area. Additional Orders / Instructions Follow Nutritious Diet - High Protein Diet: 100g of Protein daily Other: - May participate in PT if has appropriate footwear. Wound Treatment Wound #1 - Calcaneus Wound Laterality: Left Cleanser: Soap and Water (Home Health) 3 x Per Week/30 Days Discharge Instructions: Wash with Dial Antibacterial Soap Prim Dressing: Hydrofera Blue Ready Foam, 4x5 in (Home Health) (Generic) 3 x Per Week/30 Days ary Discharge Instructions: Apply to wound bed as instructed Secondary Dressing: Woven Gauze Sponge, Non-Sterile 4x4 in (Home Health) (Generic) 3 x Per Week/30 Days Discharge Instructions: Apply over primary dressing as directed. Secured With: [employer], 4.5x3.1 (in/yd) (Home Health) (Generic) 3 x Per Week/30 Days Discharge Instructions: Secure with Kerlix as directed. Secured With: 12M Medipore H Soft Cloth Surgical T ape, 4 x 10 (in/yd) (Home Health) (Generic) 3 x Per Week/30 Days Discharge Instructions: Secure with tape as directed. Electronic Signature(s) Signed: 03/19/2022 4:22:37 PM By: Worthy Keeler PA-C Signed: 03/19/2022 5:13:46 PM By: Lorrin Jackson Entered By: Lorrin Jackson on 03/19/2022  11:36:52 -------------------------------------------------------------------------------- Problem List Details Patient Name: Date of Service: [name], [name] 03/19/2022 10:15 A M Medical Record Number: [id number] Patient Account Number: [id number] Date of Birth/Sex: Treating RN: Nov 20, 1927 (88 y.o. [name] Primary Care Provider: Seward Carol Other Clinician: Referring Provider: Treating Provider/Extender: Jennye Moccasin in Treatment: 3 Active Problems ICD-10 Encounter Code Description Active Date MDM Diagnosis [phone] Pressure ulcer of left heel, stage 3 02/26/2022 No Yes Z96.642 Presence of left artificial hip joint 02/26/2022 No Yes I10 Essential (primary) hypertension 02/26/2022 No Yes I25.10 Atherosclerotic heart disease of native coronary artery without angina pectoris 02/26/2022 No Yes J44.9 Chronic obstructive pulmonary disease, unspecified 02/26/2022 No Yes I50.42 Chronic combined systolic (congestive) and diastolic (congestive) heart failure 02/26/2022 No Yes E44.0 Moderate protein-calorie malnutrition 02/26/2022 No Yes Inactive Problems Resolved Problems Electronic Signature(s) Signed: 03/19/2022 11:30:47 AM By: Worthy Keeler PA-C Entered By: Worthy Keeler on 03/19/2022 11:30:47 -------------------------------------------------------------------------------- Progress Note Details Patient Name: Date of Service: [name], [name] 03/19/2022 10:15 A M Medical Record Number: [id number] Patient Account Number: [id number] Date of Birth/Sex: Treating RN: 04-Aug-1928 (88 y.o. [name] Primary Care Provider: Seward Carol Other Clinician: Referring Provider: Treating Provider/Extender: Jennye Moccasin in Treatment: 3 Subjective Chief Complaint Information obtained from Patient Pressure ulcer left heel History of Present Illness (HPI) 02-26-2022 upon evaluation today patient presents for initial inspection here in  our clinic concerning issues that he has been having with his left heel he has a pressure ulcer that ensued following having fallen and fractured his hip. This actually occurred in April and he had surgery for total hip replacement on the left of December 24, 2021. He was discharged to a SNF at the point of leaving the hospital and it sometime during the hospital stay and SNF stay he developed a pressure ulcer to his left heel. At this point it does not seem to be getting any worse he is at [facility] independent living and they need orders to know what to do. Right now they do think that they can do the dressing changes but he needs to have the supplies and the recommendations on hand. He did have an x-ray performed on 02-13-2022 which was negative for osteomyelitis this is good news right now they just been using a dry dressing and that is good. Patient does have a history of a left hip replacement total, hypertension, coronary artery disease, COPD, congestive heart failure, and protein calorie malnutrition as noted by low serum albumin and protein. 03-05-2022 upon evaluation today patient appears to be doing well currently in regard to his heel. Unfortunately the dressing that we put on is the one that came back in with. The facility which was supposed to be changing this for him we got all the supplies for them but they never actually changed the dressing. With that being said this obviously is frustrating for the family who states they are definitely can reach out to the facility regard. They will get that straightened out. 03-12-2022 upon evaluation today patient appears to be doing well with regard to his wound although he still is not getting this changed through the week. He is pretty much staying off only put on 20 gets back. Obviously this is not the way we wanted to proceed with this but nonetheless we had a hard time getting home health out to do what they needed to do. We are going to follow-up  with a phone call and try to see what the deal is as far as them receiving our orders. We faxed them but unfortunately they do not seem to get them they say to see if there is a different fax number. 03-19-2022 upon evaluation today patient appears to be doing well currently in regard to his wound. In fact this has some slough buildup but seems to be doing quite well. I do not see any evidence of active infection at this time which is great news. No fevers, chills, nausea, vomiting, or diarrhea. Objective Constitutional Well-nourished and well-hydrated in no acute distress. Vitals Time Taken: 11:10 AM, Height: 75 in, Weight: 192 lbs, BMI: 24, Temperature: 98.1 F, Pulse: 75 bpm, Respiratory Rate: 20 breaths/min, Blood Pressure: 149/74 mmHg, Pulse Oximetry: 90 %. Respiratory normal breathing without difficulty. Psychiatric this patient is able to make decisions and demonstrates good insight into disease process. Alert and Oriented x 3. pleasant and cooperative. General Notes: Patient's wound bed showed evidence of good granulation and epithelization at this point. Fortunately I do not see any signs of infection locally or systemically which is great news and overall I am extremely pleased with where we stand. I did perform debridement of clearway the necrotic debris tolerated that without complication postdebridement the wound bed appears to be doing much better. Integumentary (Hair, Skin) Wound #1 status is Open. Original cause of wound was Pressure Injury. The date acquired was: 01/08/2022. The wound has been in treatment 3 weeks. The wound is located on the Left Calcaneus. The wound measures 2.7cm length x 3cm width x 0.2cm depth; 6.362cm^2 area and 1.272cm^3 volume. There is Fat Layer (Subcutaneous Tissue) exposed. There is no tunneling or undermining noted. There is a medium amount of serosanguineous drainage noted. The wound margin is distinct with the outline attached to the wound base. There  is large (67-100%) red, pink granulation within the wound bed. There is a small (1-33%) amount of necrotic tissue within the wound bed including Adherent Slough. 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 There was a Excisional Skin/Subcutaneous Tissue Debridement with a total area of 8.1 sq cm performed by Worthy Keeler, PA. With the following instrument(s): Curette to remove Non-Viable tissue/material. Material removed includes Subcutaneous Tissue, Slough, and Biofilm after achieving pain control using Lidocaine 4% T opical Solution. No specimens were taken. A time out was conducted at 11:29, prior to the start of the procedure. A Minimum amount of bleeding was controlled with Pressure. The procedure was tolerated well. Post Debridement Measurements: 2.7cm length x 3cm width x 0.2cm depth; 1.272cm^3 volume. Post debridement Stage noted as Category/Stage III. Character of Wound/Ulcer Post Debridement is stable. Post procedure Diagnosis Wound #1: Same as Pre-Procedure Plan Follow-up Appointments: Return Appointment in 1 week. - 03/26/22 @ 2:45pm with Orland Jarred, RN (Room 7) Return Appointment in 2 weeks. - 04/02/22 @ 2:45pm with Orland Jarred, RN (Room 7) Bathing/ Shower/ Hygiene: May shower with protection but do not get wound dressing(s) wet. - May use cast protector bag from [employer], [place] [facility] or [facility] on days dressing not being changed. May shower and wash wound with soap and water. - May wash with antibacterial soap when changing  dressing. Edema Control - Lymphedema / SCD / Other: Elevate legs to the level of the heart or above for 30 minutes daily and/or when sitting, a frequency of: - throughout the day Avoid standing for long periods of time. Off-Loading: Other: - Avoid/Limit pressure to heel. Recommend wearing a sandal that does not rub heel area. Additional Orders / Instructions: Follow Nutritious Diet - High Protein Diet: 100g of Protein daily Other: - May participate in PT if has appropriate footwear. WOUND #1: - Calcaneus Wound Laterality: Left Cleanser: Soap and Water (Home Health) 3 x Per Week/30 Days Discharge Instructions: Wash with Dial Antibacterial Soap Prim Dressing: Hydrofera Blue Ready Foam, 4x5 in (Home Health) (Generic) 3 x Per Week/30 Days ary Discharge Instructions: Apply to wound bed as instructed Secondary Dressing: Woven Gauze Sponge, Non-Sterile 4x4 in (Home Health) (Generic) 3 x Per Week/30 Days Discharge Instructions: Apply over primary dressing as directed. Secured With: [employer], 4.5x3.1 (in/yd) (Home Health) (Generic) 3 x Per Week/30 Days Discharge Instructions: Secure with Kerlix as directed. Secured With: 19M Medipore H Soft Cloth Surgical T ape, 4 x 10 (in/yd) (Home Health) (Generic) 3 x Per Week/30 Days Discharge Instructions: Secure with tape as directed. 1. I am going to suggest that we go ahead and continue with the wound care measures as before and the patient is in agreement with the plan. This includes the use of the [facility] which is doing quite well. 2. I am also can recommend that we have the patient continue with the roll gauze to secure in place which I think is doing excellent as well. 3. He should continue to try to stay off of this is much as possible to aid in helping it heal appropriately. We will see patient back for reevaluation in 1 week here in the clinic. If anything worsens or changes patient will contact our office for  additional recommendations. Electronic Signature(s) Signed: 03/19/2022 1:16:39 PM By: Worthy Keeler PA-C  Entered By: Worthy Keeler on 03/19/2022 13:16:39 -------------------------------------------------------------------------------- SuperBill Details Patient Name: Date of Service: Hector Beltran 03/19/2022 Medical Record Number: [id number] Patient Account Number: [id number] Date of Birth/Sex: Treating RN: 28-Aug-1928 (88 y.o. [name] Primary Care Provider: Seward Carol Other Clinician: Referring Provider: Treating Provider/Extender: Jennye Moccasin in Treatment: 3 Diagnosis Coding ICD-10 Codes Code Description [phone] Pressure ulcer of left heel, stage 3 Z96.642 Presence of left artificial hip joint I10 Essential (primary) hypertension I25.10 Atherosclerotic heart disease of native coronary artery without angina pectoris J44.9 Chronic obstructive pulmonary disease, unspecified I50.42 Chronic combined systolic (congestive) and diastolic (congestive) heart failure E44.0 Moderate protein-calorie malnutrition Facility Procedures CPT4 Code: [id number] Description: [id number] - DEB SUBQ TISSUE 20 SQ CM/< ICD-10 Diagnosis Description L89.623 Pressure ulcer of left heel, stage 3 Modifier: Quantity: 1 Physician Procedures : CPT4 Code Description Modifier [id number] - WC PHYS SUBQ TISS 20 SQ CM ICD-10 Diagnosis Description L89.623 Pressure ulcer of left heel, stage 3 Quantity: 1 Electronic Signature(s) Signed: 03/19/2022 1:16:47 PM By: Worthy Keeler PA-C Entered By: Worthy Keeler on 03/19/2022 13:16:47

## 2022-03-19 NOTE — Progress Notes (Signed)
CHIVAS, NOTZ ([id number]) Visit Report for 03/19/2022 Arrival Information Details Patient Name: Date of Service: Hector Beltran 03/19/2022 10:15 A M Medical Record Number: [id number] Patient Account Number: [id number] Date of Birth/Sex: Treating RN: 07/08/1928 (88 y.o. Lytle Michaels Primary Care Zanovia Rotz: Renford Dills Other Clinician: Referring Hailey Stormer: Treating Jacquelina Hewins/Extender: Prudencio Burly in Treatment: 3 Visit Information History Since Last Visit Added or deleted any medications: No Patient Arrived: Wheel Chair Any new allergies or adverse reactions: No Arrival Time: 11:06 Had a fall or experienced change in No Accompanied By: Son, Daughter in [place] activities of daily living that may affect Transfer Assistance: Manual risk of falls: Patient Identification Verified: Yes Signs or symptoms of abuse/neglect since last visito No Secondary Verification Process Completed: Yes Hospitalized since last visit: No Patient Requires Transmission-Based Precautions: No Implantable device outside of the clinic excluding No Patient Has Alerts: No cellular tissue based products placed in the center since last visit: Has Dressing in Place as Prescribed: Yes Pain Present Now: No Electronic Signature(s) Signed: 03/19/2022 5:13:46 PM By: Antonieta Iba Entered By: Antonieta Iba on 03/19/2022 11:10:18 -------------------------------------------------------------------------------- Lower Extremity Assessment Details Patient Name: Date of Service: Hector Beltran, [name] 03/19/2022 10:15 A M Medical Record Number: [id number] Patient Account Number: [id number] Date of Birth/Sex: Treating RN: [birth year] (88 y.o. Lytle Michaels Primary Care Scherrie Seneca: Renford Dills Other Clinician: Referring Levii Hairfield: Treating Shirlee Whitmire/Extender: Prudencio Burly in Treatment: 3 Edema Assessment Assessed: Kyra Searles: Yes] [Right: No] Edema: [Left: Ye] [Right:  s] Calf Left: Right: Point of Measurement: 33 cm From Medial Instep 39 cm Ankle Left: Right: Point of Measurement: 10 cm From Medial Instep 24.7 cm Vascular Assessment Pulses: Dorsalis Pedis Palpable: [Left:Yes] Electronic Signature(s) Signed: 03/19/2022 5:13:46 PM By: Antonieta Iba Entered By: Antonieta Iba on 03/19/2022 11:13:38 -------------------------------------------------------------------------------- Multi-Disciplinary Care Plan Details Patient Name: Date of Service: Hector Beltran, [name] 03/19/2022 10:15 A M Medical Record Number: [id number] Patient Account Number: [id number] Date of Birth/Sex: Treating RN: 1928/04/11 (88 y.o. Lytle Michaels Primary Care Lashawnta Burgert: Renford Dills Other Clinician: Referring Mahmoud Blazejewski: Treating Meeya Goldin/Extender: Prudencio Burly in Treatment: 3 Active Inactive Abuse / Safety / Falls / Self Care Management Nursing Diagnoses: History of Falls Goals: Patient will remain injury free related to falls Date Initiated: 02/26/2022 Target Resolution Date: 04/02/2022 Goal Status: Active Interventions: Assess fall risk on admission and as needed Assess impairment of mobility on admission and as needed per policy Notes: Wound/Skin Impairment Nursing Diagnoses: Impaired tissue integrity Goals: Patient/caregiver will verbalize understanding of skin care regimen Date Initiated: 02/26/2022 Target Resolution Date: 04/02/2022 Goal Status: Active Ulcer/skin breakdown will have a volume reduction of 30% by week 4 Date Initiated: 02/26/2022 Target Resolution Date: 04/02/2022 Goal Status: Active Interventions: Assess patient/caregiver ability to obtain necessary supplies Assess patient/caregiver ability to perform ulcer/skin care regimen upon admission and as needed Assess ulceration(s) every visit Provide education on ulcer and skin care Treatment Activities: Topical wound management initiated : 02/26/2022 Notes: Electronic  Signature(s) Signed: 03/19/2022 5:13:46 PM By: Antonieta Iba Entered By: Antonieta Iba on 03/19/2022 11:22:24 -------------------------------------------------------------------------------- Pain Assessment Details Patient Name: Date of Service: Hector Beltran, Judson Roch N. 03/19/2022 10:15 A M Medical Record Number: [id number] Patient Account Number: [id number] Date of Birth/Sex: Treating RN: [birth year] (88 y.o. Lytle Michaels Primary Care Tolbert Matheson: Renford Dills Other Clinician: Referring Takeshia Wenk: Treating Carlson Belland/Extender: Prudencio Burly in Treatment: 3 Active Problems Location of Pain Severity and Description  of Pain Patient Has Paino No Site Locations Pain Management and Medication Current Pain Management: Electronic Signature(s) Signed: 03/19/2022 5:13:46 PM By: Antonieta Iba Entered By: Antonieta Iba on 03/19/2022 11:13:22 -------------------------------------------------------------------------------- Patient/Caregiver Education Details Patient Name: Date of Service: Hector Beltran, [name] 7/5/2023andnbsp10:15 A M Medical Record Number: [id number] Patient Account Number: [id number] Date of Birth/Gender: Treating RN: 1927-12-05 (88 y.o. Lytle Michaels Primary Care Physician: Renford Dills Other Clinician: Referring Physician: Treating Physician/Extender: Prudencio Burly in Treatment: 3 Education Assessment Education Provided To: Patient Education Topics Provided Offloading: Methods: Explain/Verbal, Printed Responses: State content correctly Wound/Skin Impairment: Methods: Explain/Verbal, Printed Responses: State content correctly Electronic Signature(s) Signed: 03/19/2022 5:13:46 PM By: Antonieta Iba Entered By: Antonieta Iba on 03/19/2022 11:22:48 -------------------------------------------------------------------------------- Wound Assessment Details Patient Name: Date of Service: Hector Beltran, Judson Roch N. 03/19/2022 10:15 A  M Medical Record Number: [id number] Patient Account Number: [id number] Date of Birth/Sex: Treating RN: 24-Apr-1928 (88 y.o. Lytle Michaels Primary Care Deklin Bieler: Renford Dills Other Clinician: Referring Leeam Cedrone: Treating Gwenda Heiner/Extender: Prudencio Burly in Treatment: 3 Wound Status Wound Number: 1 Primary Pressure Ulcer Etiology: Wound Location: Left Calcaneus Wound Open Wounding Event: Pressure Injury Status: Date Acquired: 01/08/2022 Comorbid Chronic Obstructive Pulmonary Disease (COPD), Congestive Weeks Of Treatment: 3 History: Heart Failure, Coronary Artery Disease, Hypertension, Cirrhosis , Clustered Wound: No Neuropathy Photos Wound Measurements Length: (cm) 2.7 Width: (cm) 3 Depth: (cm) 0.2 Area: (cm) 6.362 Volume: (cm) 1.272 % Reduction in Area: 10% % Reduction in Volume: 10% Epithelialization: Small (1-33%) Tunneling: No Undermining: No Wound Description Classification: Category/Stage III Wound Margin: Distinct, outline attached Exudate Amount: Medium Exudate Type: Serosanguineous Exudate Color: red, brown Foul Odor After Cleansing: No Slough/Fibrino Yes Wound Bed Granulation Amount: Large (67-100%) Exposed Structure Granulation Quality: Red, Pink Fascia Exposed: No Necrotic Amount: Small (1-33%) Fat Layer (Subcutaneous Tissue) Exposed: Yes Necrotic Quality: Adherent Slough Tendon Exposed: No Muscle Exposed: No Joint Exposed: No Bone Exposed: No Electronic Signature(s) Signed: 03/19/2022 5:13:46 PM By: Antonieta Iba Entered By: Antonieta Iba on 03/19/2022 11:18:30 -------------------------------------------------------------------------------- Vitals Details Patient Name: Date of Service: Hector Beltran, [name] 03/19/2022 10:15 A M Medical Record Number: [id number] Patient Account Number: [id number] Date of Birth/Sex: Treating RN: 03-04-1928 (88 y.o. Lytle Michaels Primary Care Jeanet Lupe: Renford Dills Other  Clinician: Referring Keir Viernes: Treating Kaiea Esselman/Extender: Prudencio Burly in Treatment: 3 Vital Signs Time Taken: 11:10 Temperature (F): 98.1 Height (in): 75 Pulse (bpm): 75 Weight (lbs): 192 Respiratory Rate (breaths/min): 20 Body Mass Index (BMI): 24 Blood Pressure (mmHg): 149/74 Reference Range: 80 - 120 mg / dl Airway Pulse Oximetry (%): 90 Electronic Signature(s) Signed: 03/19/2022 5:13:46 PM By: Antonieta Iba Entered By: Antonieta Iba on 03/19/2022 11:12:46

## 2022-03-20 DIAGNOSIS — S72002D Fracture of unspecified part of neck of left femur, subsequent encounter for closed fracture with routine healing: Secondary | ICD-10-CM | POA: Diagnosis not present

## 2022-03-20 DIAGNOSIS — Z9181 History of falling: Secondary | ICD-10-CM | POA: Diagnosis not present

## 2022-03-20 DIAGNOSIS — R262 Difficulty in walking, not elsewhere classified: Secondary | ICD-10-CM | POA: Diagnosis not present

## 2022-03-20 DIAGNOSIS — Z96642 Presence of left artificial hip joint: Secondary | ICD-10-CM | POA: Diagnosis not present

## 2022-03-20 DIAGNOSIS — M6281 Muscle weakness (generalized): Secondary | ICD-10-CM | POA: Diagnosis not present

## 2022-03-21 DIAGNOSIS — Z96642 Presence of left artificial hip joint: Secondary | ICD-10-CM | POA: Diagnosis not present

## 2022-03-21 DIAGNOSIS — S72002D Fracture of unspecified part of neck of left femur, subsequent encounter for closed fracture with routine healing: Secondary | ICD-10-CM | POA: Diagnosis not present

## 2022-03-21 DIAGNOSIS — Z9181 History of falling: Secondary | ICD-10-CM | POA: Diagnosis not present

## 2022-03-21 DIAGNOSIS — M6281 Muscle weakness (generalized): Secondary | ICD-10-CM | POA: Diagnosis not present

## 2022-03-21 DIAGNOSIS — R262 Difficulty in walking, not elsewhere classified: Secondary | ICD-10-CM | POA: Diagnosis not present

## 2022-03-24 DIAGNOSIS — M6281 Muscle weakness (generalized): Secondary | ICD-10-CM | POA: Diagnosis not present

## 2022-03-24 DIAGNOSIS — Z9181 History of falling: Secondary | ICD-10-CM | POA: Diagnosis not present

## 2022-03-25 DIAGNOSIS — R262 Difficulty in walking, not elsewhere classified: Secondary | ICD-10-CM | POA: Diagnosis not present

## 2022-03-25 DIAGNOSIS — S72002D Fracture of unspecified part of neck of left femur, subsequent encounter for closed fracture with routine healing: Secondary | ICD-10-CM | POA: Diagnosis not present

## 2022-03-25 DIAGNOSIS — Z96642 Presence of left artificial hip joint: Secondary | ICD-10-CM | POA: Diagnosis not present

## 2022-03-25 DIAGNOSIS — M6281 Muscle weakness (generalized): Secondary | ICD-10-CM | POA: Diagnosis not present

## 2022-03-25 DIAGNOSIS — Z9181 History of falling: Secondary | ICD-10-CM | POA: Diagnosis not present

## 2022-03-26 ENCOUNTER — Encounter (HOSPITAL_BASED_OUTPATIENT_CLINIC_OR_DEPARTMENT_OTHER): Payer: Medicare PPO | Admitting: Physician Assistant

## 2022-03-26 DIAGNOSIS — I5042 Chronic combined systolic (congestive) and diastolic (congestive) heart failure: Secondary | ICD-10-CM | POA: Diagnosis not present

## 2022-03-26 DIAGNOSIS — M6281 Muscle weakness (generalized): Secondary | ICD-10-CM | POA: Diagnosis not present

## 2022-03-26 DIAGNOSIS — L89623 Pressure ulcer of left heel, stage 3: Secondary | ICD-10-CM | POA: Diagnosis not present

## 2022-03-26 DIAGNOSIS — Z9181 History of falling: Secondary | ICD-10-CM | POA: Diagnosis not present

## 2022-03-26 DIAGNOSIS — L97422 Non-pressure chronic ulcer of left heel and midfoot with fat layer exposed: Secondary | ICD-10-CM | POA: Diagnosis not present

## 2022-03-26 DIAGNOSIS — Z96642 Presence of left artificial hip joint: Secondary | ICD-10-CM | POA: Diagnosis not present

## 2022-03-26 DIAGNOSIS — J449 Chronic obstructive pulmonary disease, unspecified: Secondary | ICD-10-CM | POA: Diagnosis not present

## 2022-03-26 DIAGNOSIS — I2510 Atherosclerotic heart disease of native coronary artery without angina pectoris: Secondary | ICD-10-CM | POA: Diagnosis not present

## 2022-03-26 DIAGNOSIS — I110 Hypertensive heart disease with heart failure: Secondary | ICD-10-CM | POA: Diagnosis not present

## 2022-03-26 DIAGNOSIS — E440 Moderate protein-calorie malnutrition: Secondary | ICD-10-CM | POA: Diagnosis not present

## 2022-03-26 DIAGNOSIS — Z6825 Body mass index (BMI) 25.0-25.9, adult: Secondary | ICD-10-CM | POA: Diagnosis not present

## 2022-03-26 NOTE — Progress Notes (Addendum)
JOHNTE, GILDAY ([id number]) Visit Report for 03/26/2022 Chief Complaint Document Details Patient Name: Date of Service: Hector Beltran 03/26/2022 2:45 PM Medical Record Number: [id number] Patient Account Number: [id number] Date of Birth/Sex: Treating RN: 05/14/1928 (88 y.o. Lytle Michaels Primary Care Provider: Renford Dills Other Clinician: Referring Provider: Treating Provider/Extender: Prudencio Burly in Treatment: 4 Information Obtained from: Patient Chief Complaint Pressure ulcer left heel Electronic Signature(s) Signed: 03/26/2022 3:15:12 PM By: Lenda Kelp PA-C Entered By: Lenda Kelp on 03/26/2022 15:15:11 -------------------------------------------------------------------------------- Debridement Details Patient Name: Date of Service: Hector Beltran, Hector RRY N. 03/26/2022 2:45 PM Medical Record Number: [id number] Patient Account Number: [id number] Date of Birth/Sex: Treating RN: Mar 21, 1928 (88 y.o. Lytle Michaels Primary Care Provider: Renford Dills Other Clinician: Referring Provider: Treating Provider/Extender: Prudencio Burly in Treatment: 4 Debridement Performed for Assessment: Wound #1 Left Calcaneus Performed By: Physician Lenda Kelp, PA Debridement Type: Debridement Level of Consciousness (Pre-procedure): Awake and Alert Pre-procedure Verification/Time Out Yes - 15:47 Taken: Start Time: 15:48 Pain Control: Lidocaine 4% T opical Solution T Area Debrided (L x W): otal 2.6 (cm) x 2.5 (cm) = 6.5 (cm) Tissue and other material debrided: Non-Viable, Slough, Subcutaneous, Slough Level: Skin/Subcutaneous Tissue Debridement Description: Excisional Instrument: Curette Bleeding: Minimum Hemostasis Achieved: Pressure End Time: 15:52 Response to Treatment: Procedure was tolerated well Level of Consciousness (Post- Awake and Alert procedure): Post Debridement Measurements of Total Wound Length: (cm) 2.6 Stage:  Category/Stage III Width: (cm) 2.5 Depth: (cm) 0.2 Volume: (cm) 1.021 Character of Wound/Ulcer Post Debridement: Stable Post Procedure Diagnosis Same as Pre-procedure Electronic Signature(s) Signed: 03/26/2022 4:33:23 PM By: Lenda Kelp PA-C Signed: 03/26/2022 5:05:06 PM By: Antonieta Iba Entered By: Antonieta Iba on 03/26/2022 15:52:36 -------------------------------------------------------------------------------- HPI Details Patient Name: Date of Service: Hector Beltran, Hector RRY N. 03/26/2022 2:45 PM Medical Record Number: [id number] Patient Account Number: [id number] Date of Birth/Sex: Treating RN: 05/23/1928 (88 y.o. Lytle Michaels Primary Care Provider: Renford Dills Other Clinician: Referring Provider: Treating Provider/Extender: Prudencio Burly in Treatment: 4 History of Present Illness HPI Description: 02-26-2022 upon evaluation today patient presents for initial inspection here in our clinic concerning issues that he has been having with his left heel he has a pressure ulcer that ensued following having fallen and fractured his hip. This actually occurred in April and he had surgery for total hip replacement on the left of December 24, 2021. He was discharged to a SNF at the point of leaving the hospital and it sometime during the hospital stay and SNF stay he developed a pressure ulcer to his left heel. At this point it does not seem to be getting any worse he is at [facility] independent living and they need orders to know what to do. Right now they do think that they can do the dressing changes but he needs to have the supplies and the recommendations on hand. He did have an x-ray performed on 02-13-2022 which was negative for osteomyelitis this is good news right now they just been using a dry dressing and that is good. Patient does have a history of a left hip replacement total, hypertension, coronary artery disease, COPD, congestive heart failure, and  protein calorie malnutrition as noted by low serum albumin and protein. 03-05-2022 upon evaluation today patient appears to be doing well currently in regard to his heel. Unfortunately the dressing that we put on is the one that came back in with.  The facility which was supposed to be changing this for him we got all the supplies for them but they never actually changed the dressing. With that being said this obviously is frustrating for the family who states they are definitely can reach out to the facility regard. They will get that straightened out. 03-12-2022 upon evaluation today patient appears to be doing well with regard to his wound although he still is not getting this changed through the week. He is pretty much staying off only put on 20 gets back. Obviously this is not the way we wanted to proceed with this but nonetheless we had a hard time getting home health out to do what they needed to do. We are going to follow-up with a phone call and try to see what the deal is as far as them receiving our orders. We faxed them but unfortunately they do not seem to get them they say to see if there is a different fax number. 03-19-2022 upon evaluation today patient appears to be doing well currently in regard to his wound. In fact this has some slough buildup but seems to be doing quite well. I do not see any evidence of active infection at this time which is great news. No fevers, chills, nausea, vomiting, or diarrhea. 03-26-2022 upon evaluation today patient's wound is showing signs of improvement. Fortunately there does not appear to be any evidence of active infection locally or systemically at this time which is great news. No fevers, chills, nausea, vomiting, or diarrhea. Electronic Signature(s) Signed: 03/26/2022 4:22:20 PM By: Lenda Kelp PA-C Entered By: Lenda Kelp on 03/26/2022 16:22:20 -------------------------------------------------------------------------------- Physical Exam  Details Patient Name: Date of Service: Hector Beltran, Hector RRY N. 03/26/2022 2:45 PM Medical Record Number: [id number] Patient Account Number: [id number] Date of Birth/Sex: Treating RN: Sep 08, 1928 (88 y.o. Lytle Michaels Primary Care Provider: Renford Dills Other Clinician: Referring Provider: Treating Provider/Extender: Prudencio Burly in Treatment: 4 Constitutional Well-nourished and well-hydrated in no acute distress. Respiratory normal breathing without difficulty. Psychiatric this patient is able to make decisions and demonstrates good insight into disease process. Alert and Oriented x 3. pleasant and cooperative. Notes Upon inspection patient's wound bed actually showed signs of good granulation and epithelization there was some need for sharp debridement I did perform debridement today to clearway some of the necrotic debris patient tolerated that without complication and postdebridement the wound bed is significantly improved. Electronic Signature(s) Signed: 03/26/2022 4:22:43 PM By: Lenda Kelp PA-C Entered By: Lenda Kelp on 03/26/2022 16:22:43 -------------------------------------------------------------------------------- Physician Orders Details Patient Name: Date of Service: Hector Beltran, Hector RRY N. 03/26/2022 2:45 PM Medical Record Number: [id number] Patient Account Number: [id number] Date of Birth/Sex: Treating RN: 01/30/1928 (89 y.o. Lytle Michaels Primary Care Provider: Renford Dills Other Clinician: Referring Provider: Treating Provider/Extender: Prudencio Burly in Treatment: 4 Verbal / Phone Orders: No Diagnosis Coding ICD-10 Coding Code Description [id number] Pressure ulcer of left heel, stage 3 Z96.642 Presence of left artificial hip joint I10 Essential (primary) hypertension I25.10 Atherosclerotic heart disease of native coronary artery without angina pectoris J44.9 Chronic obstructive pulmonary disease,  unspecified I50.42 Chronic combined systolic (congestive) and diastolic (congestive) heart failure E44.0 Moderate protein-calorie malnutrition Follow-up Appointments ppointment in 1 week. - 04/02/22 @ 12:30pm with Shelda Altes, RN (Room 7) Return A ppointment in 2 weeks. - 04/09/22 @ 2:45pm with Shelda Altes, RN (Room 7) Return A Bathing/ Shower/ Hygiene May shower with protection  but do not get wound dressing(s) wet. - May use cast protector bag from [employer], [facility] or [facility] on days dressing not being changed. May shower and wash wound with soap and water. - May wash with antibacterial soap when changing dressing. Edema Control - Lymphedema / SCD / Other Elevate legs to the level of the heart or above for 30 minutes daily and/or when sitting, a frequency of: - throughout the day Avoid standing for long periods of time. Off-Loading Other: - Avoid/Limit pressure to heel. Recommend wearing a sandal that does not rub heel area. Additional Orders / Instructions Follow Nutritious Diet - High Protein Diet: 100g of Protein daily Other: - May participate in PT if has appropriate footwear. Wound Treatment Wound #1 - Calcaneus Wound Laterality: Left Cleanser: Soap and Water (Home Health) 3 x Per Week/30 Days Discharge Instructions: Wash with Dial Antibacterial Soap Prim Dressing: Hydrofera Blue Ready Foam, 4x5 in (Home Health) (Generic) 3 x Per Week/30 Days ary Discharge Instructions: Apply to wound bed as instructed Secondary Dressing: Woven Gauze Sponge, Non-Sterile 4x4 in (Home Health) (Generic) 3 x Per Week/30 Days Discharge Instructions: Apply over primary dressing as directed. Secured With: [employer], 4.5x3.1 (in/yd) (Home Health) (Generic) 3 x Per Week/30 Days Discharge Instructions: Secure with Kerlix as directed. Secured With: 8M Medipore H Soft Cloth Surgical T ape, 4 x 10 (in/yd) (Home Health) (Generic) 3 x Per Week/30 Days Discharge Instructions: Secure with tape as  directed. Electronic Signature(s) Signed: 03/26/2022 4:33:23 PM By: Lenda Kelp PA-C Signed: 03/26/2022 5:05:06 PM By: Antonieta Iba Entered By: Antonieta Iba on 03/26/2022 15:55:25 -------------------------------------------------------------------------------- Problem List Details Patient Name: Date of Service: Hector Beltran, Hector RRY N. 03/26/2022 2:45 PM Medical Record Number: [id number] Patient Account Number: [id number] Date of Birth/Sex: Treating RN: 1928/02/02 (88 y.o. Lytle Michaels Primary Care Provider: Renford Dills Other Clinician: Referring Provider: Treating Provider/Extender: Prudencio Burly in Treatment: 4 Active Problems ICD-10 Encounter Code Description Active Date MDM Diagnosis [phone] Pressure ulcer of left heel, stage 3 02/26/2022 No Yes Z96.642 Presence of left artificial hip joint 02/26/2022 No Yes I10 Essential (primary) hypertension 02/26/2022 No Yes I25.10 Atherosclerotic heart disease of native coronary artery without angina pectoris 02/26/2022 No Yes J44.9 Chronic obstructive pulmonary disease, unspecified 02/26/2022 No Yes I50.42 Chronic combined systolic (congestive) and diastolic (congestive) heart failure 02/26/2022 No Yes E44.0 Moderate protein-calorie malnutrition 02/26/2022 No Yes Inactive Problems Resolved Problems Electronic Signature(s) Signed: 03/26/2022 3:15:06 PM By: Lenda Kelp PA-C Entered By: Lenda Kelp on 03/26/2022 15:15:05 -------------------------------------------------------------------------------- Progress Note Details Patient Name: Date of Service: Hector Beltran, Hector RRY N. 03/26/2022 2:45 PM Medical Record Number: [id number] Patient Account Number: [id number] Date of Birth/Sex: Treating RN: [birth year] (88 y.o. Lytle Michaels Primary Care Provider: Renford Dills Other Clinician: Referring Provider: Treating Provider/Extender: Prudencio Burly in Treatment: 4 Subjective Chief  Complaint Information obtained from Patient Pressure ulcer left heel History of Present Illness (HPI) 02-26-2022 upon evaluation today patient presents for initial inspection here in our clinic concerning issues that he has been having with his left heel he has a pressure ulcer that ensued following having fallen and fractured his hip. This actually occurred in April and he had surgery for total hip replacement on the left of December 24, 2021. He was discharged to a SNF at the point of leaving the hospital and it sometime during the hospital stay and SNF stay he developed a pressure ulcer to his left heel. At  this point it does not seem to be getting any worse he is at [facility] independent living and they need orders to know what to do. Right now they do think that they can do the dressing changes but he needs to have the supplies and the recommendations on hand. He did have an x-ray performed on 02-13-2022 which was negative for osteomyelitis this is good news right now they just been using a dry dressing and that is good. Patient does have a history of a left hip replacement total, hypertension, coronary artery disease, COPD, congestive heart failure, and protein calorie malnutrition as noted by low serum albumin and protein. 03-05-2022 upon evaluation today patient appears to be doing well currently in regard to his heel. Unfortunately the dressing that we put on is the one that came back in with. The facility which was supposed to be changing this for him we got all the supplies for them but they never actually changed the dressing. With that being said this obviously is frustrating for the family who states they are definitely can reach out to the facility regard. They will get that straightened out. 03-12-2022 upon evaluation today patient appears to be doing well with regard to his wound although he still is not getting this changed through the week. He is pretty much staying off only put on 20  gets back. Obviously this is not the way we wanted to proceed with this but nonetheless we had a hard time getting home health out to do what they needed to do. We are going to follow-up with a phone call and try to see what the deal is as far as them receiving our orders. We faxed them but unfortunately they do not seem to get them they say to see if there is a different fax number. 03-19-2022 upon evaluation today patient appears to be doing well currently in regard to his wound. In fact this has some slough buildup but seems to be doing quite well. I do not see any evidence of active infection at this time which is great news. No fevers, chills, nausea, vomiting, or diarrhea. 03-26-2022 upon evaluation today patient's wound is showing signs of improvement. Fortunately there does not appear to be any evidence of active infection locally or systemically at this time which is great news. No fevers, chills, nausea, vomiting, or diarrhea. Objective Constitutional Well-nourished and well-hydrated in no acute distress. Vitals Time Taken: 3:19 PM, Height: 75 in, Weight: 192 lbs, BMI: 24, Temperature: 97.7 F, Pulse: 70 bpm, Respiratory Rate: 18 breaths/min, Blood Pressure: 154/87 mmHg, Pulse Oximetry: 90 %. Respiratory normal breathing without difficulty. Psychiatric this patient is able to make decisions and demonstrates good insight into disease process. Alert and Oriented x 3. pleasant and cooperative. General Notes: Upon inspection patient's wound bed actually showed signs of good granulation and epithelization there was some need for sharp debridement I did perform debridement today to clearway some of the necrotic debris patient tolerated that without complication and postdebridement the wound bed is significantly improved. Integumentary (Hair, Skin) Wound #1 status is Open. Original cause of wound was Pressure Injury. The date acquired was: 01/08/2022. The wound has been in treatment 4 weeks.  The wound is located on the Left Calcaneus. The wound measures 2.6cm length x 2.5cm width x 0.2cm depth; 5.105cm^2 area and 1.021cm^3 volume. There is Fat Layer (Subcutaneous Tissue) exposed. There is no tunneling or undermining noted. There is a medium amount of serosanguineous drainage noted. The wound margin  is distinct with the outline attached to the wound base. There is large (67-100%) red, pink granulation within the wound bed. There is a small (1-33%) amount of necrotic tissue within the wound bed including Adherent Slough. 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 There was a Excisional Skin/Subcutaneous Tissue Debridement with a total area of 6.5 sq cm performed by Worthy Keeler, PA. With the following instrument(s): Curette to remove Non-Viable tissue/material. Material removed includes Subcutaneous Tissue and Slough and after achieving pain control using Lidocaine 4% T opical Solution. No specimens were taken. A time out was conducted at 15:47, prior to the start of the procedure. A Minimum amount of bleeding was controlled with Pressure. The procedure was tolerated well. Post Debridement Measurements: 2.6cm length x 2.5cm width x 0.2cm depth; 1.021cm^3 volume. Post debridement Stage noted as Category/Stage III. Character of Wound/Ulcer Post Debridement is stable. Post procedure Diagnosis Wound #1: Same as Pre-Procedure Plan Follow-up Appointments: Return Appointment in 1 week. - 04/02/22 @ 12:30pm with Orland Jarred, RN (Room  7) Return Appointment in 2 weeks. - 04/09/22 @ 2:45pm with Orland Jarred, RN (Room 7) Bathing/ Shower/ Hygiene: May shower with protection but do not get wound dressing(s) wet. - May use cast protector bag from [employer], [place] or [facility] on days dressing not being changed. May shower and wash wound with soap and water. - May wash with antibacterial soap when changing dressing. Edema Control - Lymphedema / SCD / Other: Elevate legs to the level of the heart or above for 30 minutes daily and/or when sitting, a frequency of: - throughout the day Avoid standing for long periods of time. Off-Loading: Other: - Avoid/Limit pressure to heel. Recommend wearing a sandal that does not rub heel area. Additional Orders / Instructions: Follow Nutritious Diet - High Protein Diet: 100g of Protein daily Other: - May participate in PT if has appropriate footwear. WOUND #1: - Calcaneus Wound Laterality: Left Cleanser: Soap and Water (Home Health) 3 x Per Week/30 Days Discharge Instructions: Wash with Dial Antibacterial Soap Prim Dressing: Hydrofera Blue Ready Foam, 4x5 in (Home Health) (Generic) 3 x Per Week/30 Days ary Discharge Instructions: Apply to wound bed as instructed Secondary Dressing: Woven Gauze Sponge, Non-Sterile 4x4 in (Home Health) (Generic) 3 x Per Week/30 Days Discharge Instructions: Apply over primary dressing as directed. Secured With: [employer], 4.5x3.1 (in/yd) (Home Health) (Generic) 3 x Per Week/30 Days Discharge Instructions: Secure with Kerlix as directed. Secured With: 51M Medipore H Soft Cloth Surgical T ape, 4 x 10 (in/yd) (Home Health) (Generic) 3 x Per Week/30 Days Discharge Instructions: Secure with tape as directed. 1. I would recommend currently that we go ahead and continue with the use of the [facility] which I think is doing a great job and the patient seems to be doing quite well. 2. Also can recommend that we have the patient continue to monitor for any signs  of worsening or infection. Obviously if anything changes he should let me know but it would probably be the family members that would alert [place] which I think is absolutely appropriate. We will see patient back for reevaluation in 1 week  here in the clinic. If anything worsens or changes patient will contact our office for additional recommendations. Electronic Signature(s) Signed: 03/26/2022 4:23:22 PM By: Worthy Keeler PA-C Entered By: Worthy Keeler on 03/26/2022 16:23:22 -------------------------------------------------------------------------------- SuperBill Details Patient Name: Date of Service: Hector Beltran, Hector RRY N. 03/26/2022 Medical Record Number: [id number] Patient Account Number: [id number] Date of Birth/Sex: Treating RN: [birth year] (88 y.o. [name] Primary Care Provider: Seward Carol Other Clinician: Referring Provider: Treating Provider/Extender: Jennye Moccasin in Treatment: 4 Diagnosis Coding ICD-10 Codes Code Description [phone] Pressure ulcer of left heel, stage 3 Z96.642 Presence of left artificial hip joint I10 Essential (primary) hypertension I25.10 Atherosclerotic heart disease of native coronary artery without angina pectoris J44.9 Chronic obstructive pulmonary disease, unspecified I50.42 Chronic combined systolic (congestive) and diastolic (congestive) heart failure E44.0 Moderate protein-calorie malnutrition Facility Procedures CPT4 Code: [id number] Description: [id number] - DEB SUBQ TISSUE 20 SQ CM/< ICD-10 Diagnosis Description L89.623 Pressure ulcer of left heel, stage 3 Modifier: Quantity: 1 Physician Procedures : CPT4 Code Description Modifier [id number] - WC PHYS SUBQ TISS 20 SQ CM ICD-10 Diagnosis Description L89.623 Pressure ulcer of left heel, stage 3 Quantity: 1 Electronic Signature(s) Signed: 03/26/2022 4:23:31 PM By: Worthy Keeler PA-C Entered By: Worthy Keeler on 03/26/2022 16:23:31

## 2022-03-26 NOTE — Progress Notes (Signed)
JERAL, ZICK ([id number]) Visit Report for 03/26/2022 Arrival Information Details Patient Name: Date of Service: Hector Beltran 03/26/2022 2:45 PM Medical Record Number: [id number] Patient Account Number: [id number] Date of Birth/Sex: Treating RN: 28-Nov-1927 (88 y.o. Lytle Michaels Primary Care Johnice Riebe: Renford Dills Other Clinician: Referring Luc Shammas: Treating Deshaun Schou/Extender: Prudencio Burly in Treatment: 4 Visit Information History Since Last Visit Added or deleted any medications: No Patient Arrived: Wheel Chair Any new allergies or adverse reactions: No Arrival Time: 15:14 Had a fall or experienced change in No Accompanied By: Son, Daughter in [place] activities of daily living that may affect Transfer Assistance: Manual risk of falls: Patient Identification Verified: Yes Signs or symptoms of abuse/neglect since last visito No Secondary Verification Process Completed: Yes Hospitalized since last visit: No Patient Requires Transmission-Based Precautions: No Implantable device outside of the clinic excluding No Patient Has Alerts: No cellular tissue based products placed in the center since last visit: Has Dressing in Place as Prescribed: Yes Pain Present Now: No Electronic Signature(s) Signed: 03/26/2022 5:05:06 PM By: Antonieta Iba Entered By: Antonieta Iba on 03/26/2022 15:18:05 -------------------------------------------------------------------------------- Encounter Discharge Information Details Patient Name: Date of Service: Hector Beltran, Hector RRY N. 03/26/2022 2:45 PM Medical Record Number: [id number] Patient Account Number: [id number] Date of Birth/Sex: Treating RN: 03/26/1928 (88 y.o. Lytle Michaels Primary Care Mahdiya Mossberg: Renford Dills Other Clinician: Referring Madyn Ivins: Treating Yarely Bebee/Extender: Prudencio Burly in Treatment: 4 Encounter Discharge Information Items Post Procedure Vitals Discharge Condition:  Stable Temperature (F): 97.7 Ambulatory Status: Wheelchair Pulse (bpm): 70 Discharge Destination: Home Respiratory Rate (breaths/min): 18 Transportation: Private Auto Blood Pressure (mmHg): 154/87 Accompanied By: son, daughter in law Schedule Follow-up Appointment: Yes Clinical Summary of Care: Provided on 03/26/2022 Form Type Recipient Paper Patient Patient Electronic Signature(s) Signed: 03/26/2022 5:05:06 PM By: Antonieta Iba Entered By: Antonieta Iba on 03/26/2022 15:57:16 -------------------------------------------------------------------------------- Lower Extremity Assessment Details Patient Name: Date of Service: Hector Beltran, Hector Beltran 03/26/2022 2:45 PM Medical Record Number: [id number] Patient Account Number: [id number] Date of Birth/Sex: Treating RN: [birth year] (88 y.o. Lytle Michaels Primary Care Meagan Spease: Renford Dills Other Clinician: Referring Mckell Riecke: Treating Trey Gulbranson/Extender: Prudencio Burly in Treatment: 4 Edema Assessment Assessed: Hector Beltran: Yes] [Right: No] Edema: [Left: Ye] [Right: s] Calf Left: Right: Point of Measurement: 33 cm From Medial Instep 39 cm Ankle Left: Right: Point of Measurement: 10 cm From Medial Instep 24.7 cm Vascular Assessment Pulses: Dorsalis Pedis Palpable: [Left:Yes] Electronic Signature(s) Signed: 03/26/2022 5:05:06 PM By: Antonieta Iba Entered By: Antonieta Iba on 03/26/2022 15:26:04 -------------------------------------------------------------------------------- Multi-Disciplinary Care Plan Details Patient Name: Date of Service: Hector Beltran, Hector RRY N. 03/26/2022 2:45 PM Medical Record Number: [id number] Patient Account Number: [id number] Date of Birth/Sex: Treating RN: 1927/09/28 (88 y.o. Lytle Michaels Primary Care Kellan Boehlke: Renford Dills Other Clinician: Referring Hortencia Martire: Treating Ronda Rajkumar/Extender: Prudencio Burly in Treatment: 4 Active Inactive Abuse / Safety / Falls  / Self Care Management Nursing Diagnoses: History of Falls Goals: Patient will remain injury free related to falls Date Initiated: 02/26/2022 Target Resolution Date: 04/02/2022 Goal Status: Active Interventions: Assess fall risk on admission and as needed Assess impairment of mobility on admission and as needed per policy Notes: Wound/Skin Impairment Nursing Diagnoses: Impaired tissue integrity Goals: Patient/caregiver will verbalize understanding of skin care regimen Date Initiated: 02/26/2022 Target Resolution Date: 04/02/2022 Goal Status: Active Ulcer/skin breakdown will have a volume reduction of 30% by week 4 Date Initiated: 02/26/2022 Target Resolution Date:  04/02/2022 Goal Status: Active Interventions: Assess patient/caregiver ability to obtain necessary supplies Assess patient/caregiver ability to perform ulcer/skin care regimen upon admission and as needed Assess ulceration(s) every visit Provide education on ulcer and skin care Treatment Activities: Topical wound management initiated : 02/26/2022 Notes: Electronic Signature(s) Signed: 03/26/2022 5:05:06 PM By: Lorrin Jackson Entered By: Lorrin Jackson on 03/26/2022 15:29:10 -------------------------------------------------------------------------------- Pain Assessment Details Patient Name: Date of Service: Hector Beltran, Hector Norris. 03/26/2022 2:45 PM Medical Record Number: [id number] Patient Account Number: [id number] Date of Birth/Sex: Treating RN: 10-Oct-1927 (88 y.o. [name] Primary Care Alexandera Kuntzman: Seward Carol Other Clinician: Referring Lasundra Hascall: Treating Salvator Seppala/Extender: Jennye Moccasin in Treatment: 4 Active Problems Location of Pain Severity and Description of Pain Patient Has Paino No Site Locations Pain Management and Medication Current Pain Management: Electronic Signature(s) Signed: 03/26/2022 5:05:06 PM By: Lorrin Jackson Signed: 03/26/2022 5:05:06 PM By: Lorrin Jackson Entered By: Lorrin Jackson on 03/26/2022 15:20:49 -------------------------------------------------------------------------------- Patient/Caregiver Education Details Patient Name: Date of Service: Hector Beltran 7/12/2023andnbsp2:45 PM Medical Record Number: [id number] Patient Account Number: [id number] Date of Birth/Gender: Treating RN: Nov 06, 1927 (88 y.o. [name] Primary Care Physician: Seward Carol Other Clinician: Referring Physician: Treating Physician/Extender: Jennye Moccasin in Treatment: 4 Education Assessment Education Provided To: Patient Education Topics Provided Offloading: Methods: Explain/Verbal, Printed Responses: State content correctly Wound/Skin Impairment: Methods: Explain/Verbal, Printed Responses: State content correctly Electronic Signature(s) Signed: 03/26/2022 5:05:06 PM By: Lorrin Jackson Entered By: Lorrin Jackson on 03/26/2022 15:29:38 -------------------------------------------------------------------------------- Wound Assessment Details Patient Name: Date of Service: Hector Beltran, Hector RRY N. 03/26/2022 2:45 PM Medical Record Number: [id number] Patient Account Number: [id number] Date of Birth/Sex: Treating RN: Sep 03, 1928 (88 y.o. [name] Primary Care Faithlynn Deeley: Seward Carol Other Clinician: Referring Obryan Radu: Treating Talyah Seder/Extender: Jennye Moccasin in Treatment: 4 Wound Status Wound Number: 1 Primary Pressure Ulcer Etiology: Wound Location: Left Calcaneus Wound Open Wounding Event: Pressure Injury Status: Date Acquired: 01/08/2022 Comorbid Chronic Obstructive Pulmonary Disease (COPD), Congestive Weeks Of Treatment: 4 History: Heart Failure, Coronary Artery Disease, Hypertension, Cirrhosis , Clustered Wound: No Neuropathy Photos Wound Measurements Length: (cm) 2.6 Width: (cm) 2.5 Depth: (cm) 0.2 Area: (cm) 5.105 Volume: (cm) 1.021 % Reduction in Area:  27.8% % Reduction in Volume: 27.8% Epithelialization: Small (1-33%) Tunneling: No Undermining: No Wound Description Classification: Category/Stage III Wound Margin: Distinct, outline attached Exudate Amount: Medium Exudate Type: Serosanguineous Exudate Color: red, brown Foul Odor After Cleansing: No Slough/Fibrino Yes Wound Bed Granulation Amount: Large (67-100%) Exposed Structure Granulation Quality: Red, Pink Fascia Exposed: No Necrotic Amount: Small (1-33%) Fat Layer (Subcutaneous Tissue) Exposed: Yes Necrotic Quality: Adherent Slough Tendon Exposed: No Muscle Exposed: No Joint Exposed: No Bone Exposed: No Treatment Notes Wound #1 (Calcaneus) Wound Laterality: Left Cleanser Soap and Water Discharge Instruction: Wash with Dial Antibacterial Soap Peri-Wound Care Topical Primary Dressing Hydrofera Blue Ready Foam, 4x5 in Discharge Instruction: Apply to wound bed as instructed Secondary Dressing Woven Gauze Sponge, Non-Sterile 4x4 in Discharge Instruction: Apply over primary dressing as directed. Secured With [employer], 4.5x3.1 (in/yd) Discharge Instruction: Secure with Kerlix as directed. 21M Medipore H Soft Cloth Surgical T ape, 4 x 10 (in/yd) Discharge Instruction: Secure with tape as directed. Compression Wrap Compression Stockings Add-Ons Electronic Signature(s) Signed: 03/26/2022 5:05:06 PM By: Lorrin Jackson Entered By: Lorrin Jackson on 03/26/2022 15:25:35 -------------------------------------------------------------------------------- Vitals Details Patient Name: Date of Service: Hector Beltran, Hector RRY N. 03/26/2022 2:45 PM Medical Record Number: [id number] Patient Account Number: [id number]  Date of Birth/Sex: Treating RN: February 28, 1928 (88 y.o. Lytle Michaels Primary Care Ikeya Brockel: Renford Dills Other Clinician: Referring Salli Bodin: Treating Gessica Jawad/Extender: Prudencio Burly in Treatment: 4 Vital Signs Time Taken:  15:19 Temperature (F): 97.7 Height (in): 75 Pulse (bpm): 70 Weight (lbs): 192 Respiratory Rate (breaths/min): 18 Body Mass Index (BMI): 24 Blood Pressure (mmHg): 154/87 Reference Range: 80 - 120 mg / dl Airway Pulse Oximetry (%): 90 Electronic Signature(s) Signed: 03/26/2022 5:05:06 PM By: Antonieta Iba Entered By: Antonieta Iba on 03/26/2022 15:20:03

## 2022-03-27 DIAGNOSIS — R262 Difficulty in walking, not elsewhere classified: Secondary | ICD-10-CM | POA: Diagnosis not present

## 2022-03-27 DIAGNOSIS — S72002D Fracture of unspecified part of neck of left femur, subsequent encounter for closed fracture with routine healing: Secondary | ICD-10-CM | POA: Diagnosis not present

## 2022-03-27 DIAGNOSIS — Z96642 Presence of left artificial hip joint: Secondary | ICD-10-CM | POA: Diagnosis not present

## 2022-03-27 DIAGNOSIS — M6281 Muscle weakness (generalized): Secondary | ICD-10-CM | POA: Diagnosis not present

## 2022-03-27 DIAGNOSIS — Z9181 History of falling: Secondary | ICD-10-CM | POA: Diagnosis not present

## 2022-03-28 DIAGNOSIS — S72002D Fracture of unspecified part of neck of left femur, subsequent encounter for closed fracture with routine healing: Secondary | ICD-10-CM | POA: Diagnosis not present

## 2022-03-28 DIAGNOSIS — Z9181 History of falling: Secondary | ICD-10-CM | POA: Diagnosis not present

## 2022-03-28 DIAGNOSIS — R262 Difficulty in walking, not elsewhere classified: Secondary | ICD-10-CM | POA: Diagnosis not present

## 2022-03-28 DIAGNOSIS — M6281 Muscle weakness (generalized): Secondary | ICD-10-CM | POA: Diagnosis not present

## 2022-03-28 DIAGNOSIS — Z96642 Presence of left artificial hip joint: Secondary | ICD-10-CM | POA: Diagnosis not present

## 2022-03-31 DIAGNOSIS — Z9181 History of falling: Secondary | ICD-10-CM | POA: Diagnosis not present

## 2022-03-31 DIAGNOSIS — M6281 Muscle weakness (generalized): Secondary | ICD-10-CM | POA: Diagnosis not present

## 2022-04-01 DIAGNOSIS — S72002D Fracture of unspecified part of neck of left femur, subsequent encounter for closed fracture with routine healing: Secondary | ICD-10-CM | POA: Diagnosis not present

## 2022-04-01 DIAGNOSIS — R262 Difficulty in walking, not elsewhere classified: Secondary | ICD-10-CM | POA: Diagnosis not present

## 2022-04-01 DIAGNOSIS — Z96642 Presence of left artificial hip joint: Secondary | ICD-10-CM | POA: Diagnosis not present

## 2022-04-01 DIAGNOSIS — Z9181 History of falling: Secondary | ICD-10-CM | POA: Diagnosis not present

## 2022-04-01 DIAGNOSIS — M6281 Muscle weakness (generalized): Secondary | ICD-10-CM | POA: Diagnosis not present

## 2022-04-02 ENCOUNTER — Encounter (HOSPITAL_BASED_OUTPATIENT_CLINIC_OR_DEPARTMENT_OTHER): Payer: Medicare PPO | Admitting: Physician Assistant

## 2022-04-02 DIAGNOSIS — M6281 Muscle weakness (generalized): Secondary | ICD-10-CM | POA: Diagnosis not present

## 2022-04-02 DIAGNOSIS — I5042 Chronic combined systolic (congestive) and diastolic (congestive) heart failure: Secondary | ICD-10-CM | POA: Diagnosis not present

## 2022-04-02 DIAGNOSIS — Z6825 Body mass index (BMI) 25.0-25.9, adult: Secondary | ICD-10-CM | POA: Diagnosis not present

## 2022-04-02 DIAGNOSIS — Z9181 History of falling: Secondary | ICD-10-CM | POA: Diagnosis not present

## 2022-04-02 DIAGNOSIS — L89623 Pressure ulcer of left heel, stage 3: Secondary | ICD-10-CM | POA: Diagnosis not present

## 2022-04-02 DIAGNOSIS — Z96642 Presence of left artificial hip joint: Secondary | ICD-10-CM | POA: Diagnosis not present

## 2022-04-02 DIAGNOSIS — I110 Hypertensive heart disease with heart failure: Secondary | ICD-10-CM | POA: Diagnosis not present

## 2022-04-02 DIAGNOSIS — E440 Moderate protein-calorie malnutrition: Secondary | ICD-10-CM | POA: Diagnosis not present

## 2022-04-02 DIAGNOSIS — J449 Chronic obstructive pulmonary disease, unspecified: Secondary | ICD-10-CM | POA: Diagnosis not present

## 2022-04-02 DIAGNOSIS — I2510 Atherosclerotic heart disease of native coronary artery without angina pectoris: Secondary | ICD-10-CM | POA: Diagnosis not present

## 2022-04-02 NOTE — Progress Notes (Signed)
Hector Beltran, Hector Beltran ([id number]) Visit Report for 04/02/2022 Arrival Information Details Patient Name: Date of Service: Hector Beltran 04/02/2022 12:30 PM Medical Record Number: [id number] Patient Account Number: [id number] Date of Birth/Sex: Treating RN: [birth year] (88 y.o. Lytle Michaels Primary Care Jerod Mcquain: Renford Dills Other Clinician: Referring Tenille Morrill: Treating Denyla Cortese/Extender: Prudencio Burly in Treatment: 5 Visit Information History Since Last Visit Added or deleted any medications: No Patient Arrived: Wheel Chair Any new allergies or adverse reactions: No Arrival Time: 12:51 Had a fall or experienced change in No Accompanied By: son activities of daily living that may affect Transfer Assistance: Manual risk of falls: Patient Identification Verified: Yes Signs or symptoms of abuse/neglect since last visito No Secondary Verification Process Completed: Yes Hospitalized since last visit: No Patient Requires Transmission-Based Precautions: No Implantable device outside of the clinic excluding No Patient Has Alerts: No cellular tissue based products placed in the center since last visit: Has Dressing in Place as Prescribed: Yes Pain Present Now: No Electronic Signature(s) Signed: 04/02/2022 2:31:45 PM By: Antonieta Iba Entered By: Antonieta Iba on 04/02/2022 12:53:52 -------------------------------------------------------------------------------- Encounter Discharge Information Details Patient Name: Date of Service: Hector Beltran, Hector RRY N. 04/02/2022 12:30 PM Medical Record Number: [id number] Patient Account Number: [id number] Date of Birth/Sex: Treating RN: Aug 30, 1928 (88 y.o. Lytle Michaels Primary Care Oria Klimas: Renford Dills Other Clinician: Referring Braedin Millhouse: Treating Vaidehi Braddy/Extender: Prudencio Burly in Treatment: 5 Encounter Discharge Information Items Post Procedure Vitals Discharge Condition: Stable Temperature  (F): 98.3 Ambulatory Status: Wheelchair Pulse (bpm): 66 Discharge Destination: Home Respiratory Rate (breaths/min): 18 Transportation: Private Auto Blood Pressure (mmHg): 146/83 Accompanied By: son Schedule Follow-up Appointment: Yes Clinical Summary of Care: Provided on 04/02/2022 Form Type Recipient Paper Patient Patient Electronic Signature(s) Signed: 04/02/2022 2:31:45 PM By: Antonieta Iba Entered By: Antonieta Iba on 04/02/2022 13:22:36 -------------------------------------------------------------------------------- Lower Extremity Assessment Details Patient Name: Date of Service: Hector Beltran, Hector RRY N. 04/02/2022 12:30 PM Medical Record Number: [id number] Patient Account Number: [id number] Date of Birth/Sex: Treating RN: 08-13-1928 (88 y.o. Lytle Michaels Primary Care Victor Granados: Renford Dills Other Clinician: Referring Ryley Teater: Treating Isidro Monks/Extender: Prudencio Burly in Treatment: 5 Edema Assessment Assessed: Kyra Searles: Yes] [Right: No] Edema: [Left: N] [Right: o] Calf Left: Right: Point of Measurement: 33 cm From Medial Instep 38.8 cm Ankle Left: Right: Point of Measurement: 10 cm From Medial Instep 25 cm Vascular Assessment Pulses: Dorsalis Pedis Palpable: [Left:Yes] Electronic Signature(s) Signed: 04/02/2022 2:31:45 PM By: Antonieta Iba Entered By: Antonieta Iba on 04/02/2022 12:54:53 -------------------------------------------------------------------------------- Multi-Disciplinary Care Plan Details Patient Name: Date of Service: Hector Beltran, Hector RRY N. 04/02/2022 12:30 PM Medical Record Number: [id number] Patient Account Number: [id number] Date of Birth/Sex: Treating RN: [birth year] (88 y.o. Lytle Michaels Primary Care Carlen Fils: Renford Dills Other Clinician: Referring Nello Corro: Treating Kashonda Sarkisyan/Extender: Prudencio Burly in Treatment: 5 Active Inactive Wound/Skin Impairment Nursing Diagnoses: Impaired tissue  integrity Goals: Patient/caregiver will verbalize understanding of skin care regimen Date Initiated: 02/26/2022 Target Resolution Date: 04/30/2022 Goal Status: Active Ulcer/skin breakdown will have a volume reduction of 30% by week 4 Date Initiated: 02/26/2022 Target Resolution Date: 04/30/2022 Goal Status: Active Interventions: Assess patient/caregiver ability to obtain necessary supplies Assess patient/caregiver ability to perform ulcer/skin care regimen upon admission and as needed Assess ulceration(s) every visit Provide education on ulcer and skin care Treatment Activities: Topical wound management initiated : 02/26/2022 Notes: 04/02/22: Wound care regimen continues. Electronic Signature(s) Signed: 04/02/2022 2:31:45 PM By: Bo Mcclintock  By: Antonieta Iba on 04/02/2022 13:04:40 -------------------------------------------------------------------------------- Pain Assessment Details Patient Name: Date of Service: Hector Beltran 04/02/2022 12:30 PM Medical Record Number: [id number] Patient Account Number: [id number] Date of Birth/Sex: Treating RN: 06/27/1928 (88 y.o. Lytle Michaels Primary Care Keondre Markson: Renford Dills Other Clinician: Referring Nawaf Strange: Treating Damia Bobrowski/Extender: Prudencio Burly in Treatment: 5 Active Problems Location of Pain Severity and Description of Pain Patient Has Paino No Site Locations Pain Management and Medication Current Pain Management: Electronic Signature(s) Signed: 04/02/2022 2:31:45 PM By: Antonieta Iba Entered By: Antonieta Iba on 04/02/2022 12:54:31 -------------------------------------------------------------------------------- Patient/Caregiver Education Details Patient Name: Date of Service: Hector Beltran, [name] 7/19/2023andnbsp12:30 PM Medical Record Number: [id number] Patient Account Number: [id number] Date of Birth/Gender: Treating RN: 1928/04/24 (88 y.o. Lytle Michaels Primary Care  Physician: Renford Dills Other Clinician: Referring Physician: Treating Physician/Extender: Prudencio Burly in Treatment: 5 Education Assessment Education Provided To: Patient and Caregiver Education Topics Provided Pressure: Methods: Explain/Verbal, Printed Responses: State content correctly Wound/Skin Impairment: Methods: Explain/Verbal, Printed Responses: State content correctly Electronic Signature(s) Signed: 04/02/2022 2:31:45 PM By: Antonieta Iba Entered By: Antonieta Iba on 04/02/2022 13:04:16 -------------------------------------------------------------------------------- Wound Assessment Details Patient Name: Date of Service: Hector Beltran, Hector RRY N. 04/02/2022 12:30 PM Medical Record Number: [id number] Patient Account Number: [id number] Date of Birth/Sex: Treating RN: 12/12/1927 (88 y.o. Lytle Michaels Primary Care Haylyn Halberg: Renford Dills Other Clinician: Referring Renu Asby: Treating Noa Galvao/Extender: Prudencio Burly in Treatment: 5 Wound Status Wound Number: 1 Primary Pressure Ulcer Etiology: Wound Location: Left Calcaneus Wound Open Wounding Event: Pressure Injury Status: Date Acquired: 01/08/2022 Comorbid Chronic Obstructive Pulmonary Disease (COPD), Congestive Weeks Of Treatment: 5 History: Heart Failure, Coronary Artery Disease, Hypertension, Cirrhosis , Clustered Wound: No Neuropathy Photos Wound Measurements Length: (cm) 2.7 Width: (cm) 2.5 Depth: (cm) 0.2 Area: (cm) 5.301 Volume: (cm) 1.06 % Reduction in Area: 25% % Reduction in Volume: 25% Epithelialization: Small (1-33%) Tunneling: No Undermining: No Wound Description Classification: Category/Stage III Wound Margin: Distinct, outline attached Exudate Amount: Medium Exudate Type: Serosanguineous Exudate Color: red, brown Foul Odor After Cleansing: No Slough/Fibrino Yes Wound Bed Granulation Amount: Large (67-100%) Exposed  Structure Granulation Quality: Red, Pink Fascia Exposed: No Necrotic Amount: Small (1-33%) Fat Layer (Subcutaneous Tissue) Exposed: Yes Necrotic Quality: Adherent Slough Tendon Exposed: No Muscle Exposed: No Joint Exposed: No Bone Exposed: No Treatment Notes Wound #1 (Calcaneus) Wound Laterality: Left Cleanser Soap and Water Discharge Instruction: Wash with Dial Antibacterial Soap Peri-Wound Care Topical Primary Dressing Hydrofera Blue Ready Foam, 4x5 in Discharge Instruction: Apply to wound bed as instructed Secondary Dressing Woven Gauze Sponge, Non-Sterile 4x4 in Discharge Instruction: Apply over primary dressing as directed. Secured With [employer], 4.5x3.1 (in/yd) Discharge Instruction: Secure with Kerlix as directed. 82M Medipore H Soft Cloth Surgical T ape, 4 x 10 (in/yd) Discharge Instruction: Secure with tape as directed. Compression Wrap Compression Stockings Add-Ons Electronic Signature(s) Signed: 04/02/2022 2:31:45 PM By: Antonieta Iba Entered By: Antonieta Iba on 04/02/2022 12:59:29 -------------------------------------------------------------------------------- Vitals Details Patient Name: Date of Service: Hector Beltran, Hector RRY N. 04/02/2022 12:30 PM Medical Record Number: [id number] Patient Account Number: [id number] Date of Birth/Sex: Treating RN: 11-09-1927 (88 y.o. Lytle Michaels Primary Care Paxson Harrower: Renford Dills Other Clinician: Referring Asanti Craigo: Treating Blaise Grieshaber/Extender: Prudencio Burly in Treatment: 5 Vital Signs Time Taken: 12:53 Temperature (F): 98.3 Height (in): 75 Pulse (bpm): 66 Weight (lbs): 192 Respiratory Rate (breaths/min): 18 Body Mass Index (BMI): 24 Blood Pressure (mmHg):  146/83 Reference Range: 80 - 120 mg / dl Airway Pulse Oximetry (%): 92 Electronic Signature(s) Signed: 04/02/2022 2:31:45 PM By: Lorrin Jackson Entered By: Lorrin Jackson on 04/02/2022 12:54:25

## 2022-04-02 NOTE — Progress Notes (Addendum)
LAYTEN, AIKEN ([id number]) Visit Report for 04/02/2022 Chief Complaint Document Details Patient Name: Date of Service: Hector Beltran 04/02/2022 12:30 PM Medical Record Number: [id number] Patient Account Number: [id number] Date of Birth/Sex: Treating RN: [birth year] (88 y.o. Hector Beltran Primary Care Provider: Renford Dills Other Clinician: Referring Provider: Treating Provider/Extender: Prudencio Burly in Treatment: 5 Information Obtained from: Patient Chief Complaint Pressure ulcer left heel Electronic Signature(s) Signed: 04/02/2022 1:07:03 PM By: Lenda Kelp PA-C Entered By: Lenda Kelp on 04/02/2022 13:07:02 -------------------------------------------------------------------------------- Debridement Details Patient Name: Date of Service: Hector Beltran, Hector RRY N. 04/02/2022 12:30 PM Medical Record Number: [id number] Patient Account Number: [id number] Date of Birth/Sex: Treating RN: 1928/05/26 (88 y.o. Hector Beltran Primary Care Provider: Renford Dills Other Clinician: Referring Provider: Treating Provider/Extender: Prudencio Burly in Treatment: 5 Debridement Performed for Assessment: Wound #1 Left Calcaneus Performed By: Physician Lenda Kelp, PA Debridement Type: Debridement Level of Consciousness (Pre-procedure): Awake and Alert Pre-procedure Verification/Time Out Yes - 12:59 Taken: Start Time: 13:00 Pain Control: Lidocaine 5% topical ointment T Area Debrided (L x W): otal 2.7 (cm) x 2.5 (cm) = 6.75 (cm) Tissue and other material debrided: Non-Viable, Slough, Subcutaneous, Slough Level: Skin/Subcutaneous Tissue Debridement Description: Excisional Instrument: Curette Bleeding: Minimum Hemostasis Achieved: Pressure End Time: 13:08 Response to Treatment: Procedure was tolerated well Level of Consciousness (Post- Awake and Alert procedure): Post Debridement Measurements of Total Wound Length: (cm) 2.7 Stage:  Category/Stage III Width: (cm) 2.5 Depth: (cm) 0.2 Volume: (cm) 1.06 Character of Wound/Ulcer Post Debridement: Stable Post Procedure Diagnosis Same as Pre-procedure Electronic Signature(s) Signed: 04/02/2022 2:31:45 PM By: Antonieta Iba Signed: 04/02/2022 3:46:42 PM By: Lenda Kelp PA-C Entered By: Antonieta Iba on 04/02/2022 13:11:09 -------------------------------------------------------------------------------- HPI Details Patient Name: Date of Service: Hector Beltran, Hector RRY N. 04/02/2022 12:30 PM Medical Record Number: [id number] Patient Account Number: [id number] Date of Birth/Sex: Treating RN: December 07, 1927 (88 y.o. Hector Beltran Primary Care Provider: Renford Dills Other Clinician: Referring Provider: Treating Provider/Extender: Prudencio Burly in Treatment: 5 History of Present Illness HPI Description: 02-26-2022 upon evaluation today patient presents for initial inspection here in our clinic concerning issues that he has been having with his left heel he has a pressure ulcer that ensued following having fallen and fractured his hip. This actually occurred in April and he had surgery for total hip replacement on the left of December 24, 2021. He was discharged to a SNF at the point of leaving the hospital and it sometime during the hospital stay and SNF stay he developed a pressure ulcer to his left heel. At this point it does not seem to be getting any worse he is at [facility] independent living and they need orders to know what to do. Right now they do think that they can do the dressing changes but he needs to have the supplies and the recommendations on hand. He did have an x-ray performed on 02-13-2022 which was negative for osteomyelitis this is good news right now they just been using a dry dressing and that is good. Patient does have a history of a left hip replacement total, hypertension, coronary artery disease, COPD, congestive heart failure, and  protein calorie malnutrition as noted by low serum albumin and protein. 03-05-2022 upon evaluation today patient appears to be doing well currently in regard to his heel. Unfortunately the dressing that we put on is the one that came back in with. The  facility which was supposed to be changing this for him we got all the supplies for them but they never actually changed the dressing. With that being said this obviously is frustrating for the family who states they are definitely can reach out to the facility regard. They will get that straightened out. 03-12-2022 upon evaluation today patient appears to be doing well with regard to his wound although he still is not getting this changed through the week. He is pretty much staying off only put on 20 gets back. Obviously this is not the way we wanted to proceed with this but nonetheless we had a hard time getting home health out to do what they needed to do. We are going to follow-up with a phone call and try to see what the deal is as far as them receiving our orders. We faxed them but unfortunately they do not seem to get them they say to see if there is a different fax number. 03-19-2022 upon evaluation today patient appears to be doing well currently in regard to his wound. In fact this has some slough buildup but seems to be doing quite well. I do not see any evidence of active infection at this time which is great news. No fevers, chills, nausea, vomiting, or diarrhea. 03-26-2022 upon evaluation today patient's wound is showing signs of improvement. Fortunately there does not appear to be any evidence of active infection locally or systemically at this time which is great news. No fevers, chills, nausea, vomiting, or diarrhea. 04-02-2022 upon evaluation today patient appears to be doing well currently in regard to his wound. He is actually showing signs of improvement which is great news. Fortunately I do not see any evidence of active infection locally  or systemically. There was a lot of dry skin around the edges of the wound once I remove this and actually clean the surface of the wound this is measuring significantly smaller which is great news. I am very pleased in that regard. Electronic Signature(s) Signed: 04/02/2022 1:22:30 PM By: Worthy Keeler PA-C Entered By: Worthy Keeler on 04/02/2022 13:22:29 -------------------------------------------------------------------------------- Physical Exam Details Patient Name: Date of Service: Hector Beltran, Hector RRY N. 04/02/2022 12:30 PM Medical Record Number: [id number] Patient Account Number: [id number] Date of Birth/Sex: Treating RN: May 17, 1928 (88 y.o. [name] Primary Care Provider: Seward Carol Other Clinician: Referring Provider: Treating Provider/Extender: Jennye Moccasin in Treatment: 5 Constitutional Well-nourished and well-hydrated in no acute distress. Respiratory normal breathing without difficulty. Psychiatric this patient is able to make decisions and demonstrates good insight into disease process. Alert and Oriented x 3. pleasant and cooperative. Notes Upon inspection patient's wound bed actually showed signs of good granulation and epithelization at this point. Fortunately I do not see any evidence of infection locally or systemically which is great news and overall I do believe that we are on the right track here. Electronic Signature(s) Signed: 04/02/2022 1:22:55 PM By: Worthy Keeler PA-C Entered By: Worthy Keeler on 04/02/2022 13:22:55 -------------------------------------------------------------------------------- Physician Orders Details Patient Name: Date of Service: Hector Beltran, Hector RRY N. 04/02/2022 12:30 PM Medical Record Number: [id number] Patient Account Number: [id number] Date of Birth/Sex: Treating RN: 22-Feb-1928 (88 y.o. [name] Primary Care Provider: Seward Carol Other Clinician: Referring Provider: Treating  Provider/Extender: Jennye Moccasin in Treatment: 5 Verbal / Phone Orders: No Diagnosis Coding ICD-10 Coding Code Description [phone] Pressure ulcer of left heel, stage 3 Z96.642 Presence of left artificial  hip joint I10 Essential (primary) hypertension I25.10 Atherosclerotic heart disease of native coronary artery without angina pectoris J44.9 Chronic obstructive pulmonary disease, unspecified I50.42 Chronic combined systolic (congestive) and diastolic (congestive) heart failure E44.0 Moderate protein-calorie malnutrition Follow-up Appointments ppointment in 2 weeks. - 04/16/22 @ 2:45pm with Orland Jarred, RN (Room 7) Return A Bathing/ Shower/ Hygiene May shower with protection but do not get wound dressing(s) wet. - May use cast protector bag from [employer], [place] or [facility] on days dressing not being changed. May shower and wash wound with soap and water. - May wash with antibacterial soap when changing dressing. Edema Control - Lymphedema / SCD / Other Elevate legs to the level of the heart or above for 30 minutes daily and/or when sitting, a frequency of: - throughout the day Avoid standing for long periods of time. Off-Loading Other: - Avoid/Limit pressure to heel. Recommend wearing a sandal that does not rub heel area. Additional Orders / Instructions Follow Nutritious Diet - High Protein Diet: 100g of Protein daily Other: - May participate in PT if has appropriate footwear. Wound Treatment Wound #1 - Calcaneus Wound Laterality: Left Cleanser: Soap and Water (Home Health) 3 x Per Week/30 Days Discharge Instructions: Wash with Dial Antibacterial Soap Prim Dressing: Hydrofera Blue Ready Foam, 4x5 in (Home Health) (Generic) 3 x Per Week/30 Days ary Discharge Instructions: Apply to wound bed as instructed Secondary Dressing: Woven Gauze Sponge, Non-Sterile 4x4 in (Home Health) (Generic) 3 x Per Week/30 Days Discharge Instructions: Apply over primary dressing as  directed. Secured With: [employer], 4.5x3.1 (in/yd) (Home Health) (Generic) 3 x Per Week/30 Days Discharge Instructions: Secure with Kerlix as directed. Secured With: 67M Medipore H Soft Cloth Surgical T ape, 4 x 10 (in/yd) (Home Health) (Generic) 3 x Per Week/30 Days Discharge Instructions: Secure with tape as directed. Electronic Signature(s) Signed: 04/02/2022 2:31:45 PM By: Lorrin Jackson Signed: 04/02/2022 3:46:42 PM By: Worthy Keeler PA-C Entered By: Lorrin Jackson on 04/02/2022 13:14:24 -------------------------------------------------------------------------------- Problem List Details Patient Name: Date of Service: Hector Beltran, Hector RRY N. 04/02/2022 12:30 PM Medical Record Number: [id number] Patient Account Number: [id number] Date of Birth/Sex: Treating RN: 05/04/1928 (88 y.o. [name] Primary Care Provider: Seward Carol Other Clinician: Referring Provider: Treating Provider/Extender: Jennye Moccasin in Treatment: 5 Active Problems ICD-10 Encounter Code Description Active Date MDM Diagnosis [phone] Pressure ulcer of left heel, stage 3 02/26/2022 No Yes Z96.642 Presence of left artificial hip joint 02/26/2022 No Yes I10 Essential (primary) hypertension 02/26/2022 No Yes I25.10 Atherosclerotic heart disease of native coronary artery without angina pectoris 02/26/2022 No Yes J44.9 Chronic obstructive pulmonary disease, unspecified 02/26/2022 No Yes I50.42 Chronic combined systolic (congestive) and diastolic (congestive) heart failure 02/26/2022 No Yes E44.0 Moderate protein-calorie malnutrition 02/26/2022 No Yes Inactive Problems Resolved Problems Electronic Signature(s) Signed: 04/02/2022 1:06:54 PM By: Worthy Keeler PA-C Entered By: Worthy Keeler on 04/02/2022 13:06:54 -------------------------------------------------------------------------------- Progress Note Details Patient Name: Date of Service: Hector Beltran, Hector RRY N. 04/02/2022 12:30  PM Medical Record Number: [id number] Patient Account Number: [id number] Date of Birth/Sex: Treating RN: Mar 10, 1928 (88 y.o. [name] Primary Care Provider: Seward Carol Other Clinician: Referring Provider: Treating Provider/Extender: Jennye Moccasin in Treatment: 5 Subjective Chief Complaint Information obtained from Patient Pressure ulcer left heel History of Present Illness (HPI) 02-26-2022 upon evaluation today patient presents for initial inspection here in our clinic concerning issues that he has been having with his left heel he has a pressure  ulcer that ensued following having fallen and fractured his hip. This actually occurred in April and he had surgery for total hip replacement on the left of December 24, 2021. He was discharged to a SNF at the point of leaving the hospital and it sometime during the hospital stay and SNF stay he developed a pressure ulcer to his left heel. At this point it does not seem to be getting any worse he is at [facility] independent living and they need orders to know what to do. Right now they do think that they can do the dressing changes but he needs to have the supplies and the recommendations on hand. He did have an x-ray performed on 02-13-2022 which was negative for osteomyelitis this is good news right now they just been using a dry dressing and that is good. Patient does have a history of a left hip replacement total, hypertension, coronary artery disease, COPD, congestive heart failure, and protein calorie malnutrition as noted by low serum albumin and protein. 03-05-2022 upon evaluation today patient appears to be doing well currently in regard to his heel. Unfortunately the dressing that we put on is the one that came back in with. The facility which was supposed to be changing this for him we got all the supplies for them but they never actually changed the dressing. With that being said this obviously is frustrating  for the family who states they are definitely can reach out to the facility regard. They will get that straightened out. 03-12-2022 upon evaluation today patient appears to be doing well with regard to his wound although he still is not getting this changed through the week. He is pretty much staying off only put on 20 gets back. Obviously this is not the way we wanted to proceed with this but nonetheless we had a hard time getting home health out to do what they needed to do. We are going to follow-up with a phone call and try to see what the deal is as far as them receiving our orders. We faxed them but unfortunately they do not seem to get them they say to see if there is a different fax number. 03-19-2022 upon evaluation today patient appears to be doing well currently in regard to his wound. In fact this has some slough buildup but seems to be doing quite well. I do not see any evidence of active infection at this time which is great news. No fevers, chills, nausea, vomiting, or diarrhea. 03-26-2022 upon evaluation today patient's wound is showing signs of improvement. Fortunately there does not appear to be any evidence of active infection locally or systemically at this time which is great news. No fevers, chills, nausea, vomiting, or diarrhea. 04-02-2022 upon evaluation today patient appears to be doing well currently in regard to his wound. He is actually showing signs of improvement which is great news. Fortunately I do not see any evidence of active infection locally or systemically. There was a lot of dry skin around the edges of the wound once I remove this and actually clean the surface of the wound this is measuring significantly smaller which is great news. I am very pleased in that regard. Objective Constitutional Well-nourished and well-hydrated in no acute distress. Vitals Time Taken: 12:53 PM, Height: 75 in, Weight: 192 lbs, BMI: 24, Temperature: 98.3 F, Pulse: 66 bpm, Respiratory  Rate: 18 breaths/min, Blood Pressure: 146/83 mmHg, Pulse Oximetry: 92 %. Respiratory normal breathing without difficulty. Psychiatric this patient is  able to make decisions and demonstrates good insight into disease process. Alert and Oriented x 3. pleasant and cooperative. General Notes: Upon inspection patient's wound bed actually showed signs of good granulation and epithelization at this point. Fortunately I do not see any evidence of infection locally or systemically which is great news and overall I do believe that we are on the right track here. Integumentary (Hair, Skin) Wound #1 status is Open. Original cause of wound was Pressure Injury. The date acquired was: 01/08/2022. The wound has been in treatment 5 weeks. The wound is located on the Left Calcaneus. The wound measures 2.7cm length x 2.5cm width x 0.2cm depth; 5.301cm^2 area and 1.06cm^3 volume. There is Fat Layer (Subcutaneous Tissue) exposed. There is no tunneling or undermining noted. There is a medium amount of serosanguineous drainage noted. The wound margin is distinct with the outline attached to the wound base. There is large (67-100%) red, pink granulation within the wound bed. There is a small (1-33%) amount of necrotic tissue within the wound bed including Adherent Slough. 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 There was a Excisional Skin/Subcutaneous Tissue Debridement with a total area of 6.75 sq cm performed by Worthy Keeler, PA. With the following  instrument(s): Curette to remove Non-Viable tissue/material. Material removed includes Subcutaneous Tissue and Slough and after achieving pain control using Lidocaine 5% topical ointment. No specimens were taken. A time out was conducted at 12:59, prior to the start of the procedure. A Minimum amount of bleeding was controlled with Pressure. The procedure was tolerated well. Post Debridement Measurements: 2.7cm length x 2.5cm width x 0.2cm depth; 1.06cm^3 volume. Post debridement Stage noted as Category/Stage III. Character of Wound/Ulcer Post Debridement is stable. Post procedure Diagnosis Wound #1: Same as Pre-Procedure Plan Follow-up Appointments: Return Appointment in 2 weeks. - 04/16/22 @ 2:45pm with Orland Jarred, RN (Room 7) Bathing/ Shower/ Hygiene: May shower with protection but do not get wound dressing(s) wet. - May use cast protector bag from [employer], [place] or [facility] on days dressing not being changed. May shower and wash wound with soap and water. - May wash with antibacterial soap when changing dressing. Edema Control - Lymphedema / SCD / Other: Elevate legs to the level of the heart or above for 30 minutes daily and/or when sitting, a frequency of: - throughout the day Avoid standing for long periods of time. Off-Loading: Other: - Avoid/Limit pressure to heel. Recommend wearing a sandal that does not rub heel area. Additional Orders / Instructions: Follow Nutritious Diet - High Protein Diet: 100g of Protein daily Other: - May participate in PT if has appropriate footwear. WOUND #1: - Calcaneus Wound Laterality: Left Cleanser: Soap and Water (Home Health) 3 x Per Week/30 Days Discharge Instructions: Wash with Dial Antibacterial Soap Prim Dressing: Hydrofera Blue Ready Foam, 4x5 in (Home Health) (Generic) 3 x Per Week/30 Days ary Discharge Instructions: Apply to wound bed as instructed Secondary Dressing: Woven Gauze Sponge, Non-Sterile 4x4 in (Home Health) (Generic) 3 x Per  Week/30 Days Discharge Instructions: Apply over primary dressing as directed. Secured With: [employer], 4.5x3.1 (in/yd) (Home Health) (Generic) 3 x Per Week/30 Days  Discharge Instructions: Secure with Kerlix as directed. Secured With: 50M Medipore H Soft Cloth Surgical T ape, 4 x 10 (in/yd) (Home Health) (Generic) 3 x Per Week/30 Days Discharge Instructions: Secure with tape as directed. 1. I am going to recommend that we go ahead and initiate a continuation of treatment with Hydrofera Blue to the wound bed which I think is doing quite well. 2. I am also can recommend that we continue with the ABD pad and roll gauze to secure in place. 3. I am also can recommend that he continue using the current shoes those are the [place] which are doing quite well for him he also should be wearing socks they brought those in to show me today and those will be perfect he has some really nice diabetic socks. We will see patient back for reevaluation in 2 weeks here in the clinic. If anything worsens or changes patient will contact our office for additional recommendations. This is per family's request. Since the patient is doing well I think this is okay. Electronic Signature(s) Signed: 04/02/2022 1:23:42 PM By: Lenda Kelp PA-C Entered By: Lenda Kelp on 04/02/2022 13:23:42 -------------------------------------------------------------------------------- SuperBill Details Patient Name: Date of Service: Hector Beltran, Hector RRY N. 04/02/2022 Medical Record Number: [id number] Patient Account Number: [id number] Date of Birth/Sex: Treating RN: 1928/06/19 (88 y.o. Hector Beltran Primary Care Provider: Renford Dills Other Clinician: Referring Provider: Treating Provider/Extender: Prudencio Burly in Treatment: 5 Diagnosis Coding ICD-10 Codes Code Description [phone] Pressure ulcer of left heel, stage 3 Z96.642 Presence of left artificial hip joint I10 Essential (primary)  hypertension I25.10 Atherosclerotic heart disease of native coronary artery without angina pectoris J44.9 Chronic obstructive pulmonary disease, unspecified I50.42 Chronic combined systolic (congestive) and diastolic (congestive) heart failure E44.0 Moderate protein-calorie malnutrition Facility Procedures CPT4 Code: [id number] Description: 11042 - DEB SUBQ TISSUE 20 SQ CM/< ICD-10 Diagnosis Description L89.623 Pressure ulcer of left heel, stage 3 Modifier: Quantity: 1 Physician Procedures : CPT4 Code Description Modifier [id number] 11042 - WC PHYS SUBQ TISS 20 SQ CM ICD-10 Diagnosis Description L89.623 Pressure ulcer of left heel, stage 3 Quantity: 1 Electronic Signature(s) Signed: 04/02/2022 1:23:50 PM By: Lenda Kelp PA-C Entered By: Lenda Kelp on 04/02/2022 13:23:49

## 2022-04-03 DIAGNOSIS — R262 Difficulty in walking, not elsewhere classified: Secondary | ICD-10-CM | POA: Diagnosis not present

## 2022-04-03 DIAGNOSIS — Z96642 Presence of left artificial hip joint: Secondary | ICD-10-CM | POA: Diagnosis not present

## 2022-04-03 DIAGNOSIS — M6281 Muscle weakness (generalized): Secondary | ICD-10-CM | POA: Diagnosis not present

## 2022-04-03 DIAGNOSIS — Z9181 History of falling: Secondary | ICD-10-CM | POA: Diagnosis not present

## 2022-04-03 DIAGNOSIS — S72002D Fracture of unspecified part of neck of left femur, subsequent encounter for closed fracture with routine healing: Secondary | ICD-10-CM | POA: Diagnosis not present

## 2022-04-04 DIAGNOSIS — S72002D Fracture of unspecified part of neck of left femur, subsequent encounter for closed fracture with routine healing: Secondary | ICD-10-CM | POA: Diagnosis not present

## 2022-04-04 DIAGNOSIS — M6281 Muscle weakness (generalized): Secondary | ICD-10-CM | POA: Diagnosis not present

## 2022-04-04 DIAGNOSIS — Z96642 Presence of left artificial hip joint: Secondary | ICD-10-CM | POA: Diagnosis not present

## 2022-04-04 DIAGNOSIS — Z9181 History of falling: Secondary | ICD-10-CM | POA: Diagnosis not present

## 2022-04-04 DIAGNOSIS — R262 Difficulty in walking, not elsewhere classified: Secondary | ICD-10-CM | POA: Diagnosis not present

## 2022-04-07 DIAGNOSIS — R262 Difficulty in walking, not elsewhere classified: Secondary | ICD-10-CM | POA: Diagnosis not present

## 2022-04-07 DIAGNOSIS — S72002D Fracture of unspecified part of neck of left femur, subsequent encounter for closed fracture with routine healing: Secondary | ICD-10-CM | POA: Diagnosis not present

## 2022-04-07 DIAGNOSIS — Z96642 Presence of left artificial hip joint: Secondary | ICD-10-CM | POA: Diagnosis not present

## 2022-04-07 DIAGNOSIS — M6281 Muscle weakness (generalized): Secondary | ICD-10-CM | POA: Diagnosis not present

## 2022-04-07 DIAGNOSIS — Z9181 History of falling: Secondary | ICD-10-CM | POA: Diagnosis not present

## 2022-04-08 DIAGNOSIS — R262 Difficulty in walking, not elsewhere classified: Secondary | ICD-10-CM | POA: Diagnosis not present

## 2022-04-08 DIAGNOSIS — M6281 Muscle weakness (generalized): Secondary | ICD-10-CM | POA: Diagnosis not present

## 2022-04-08 DIAGNOSIS — S72002D Fracture of unspecified part of neck of left femur, subsequent encounter for closed fracture with routine healing: Secondary | ICD-10-CM | POA: Diagnosis not present

## 2022-04-08 DIAGNOSIS — Z96642 Presence of left artificial hip joint: Secondary | ICD-10-CM | POA: Diagnosis not present

## 2022-04-08 DIAGNOSIS — Z9181 History of falling: Secondary | ICD-10-CM | POA: Diagnosis not present

## 2022-04-09 ENCOUNTER — Encounter (HOSPITAL_BASED_OUTPATIENT_CLINIC_OR_DEPARTMENT_OTHER): Payer: Medicare PPO | Admitting: Internal Medicine

## 2022-04-09 DIAGNOSIS — M6281 Muscle weakness (generalized): Secondary | ICD-10-CM | POA: Diagnosis not present

## 2022-04-09 DIAGNOSIS — Z9181 History of falling: Secondary | ICD-10-CM | POA: Diagnosis not present

## 2022-04-11 DIAGNOSIS — M6281 Muscle weakness (generalized): Secondary | ICD-10-CM | POA: Diagnosis not present

## 2022-04-11 DIAGNOSIS — S72002D Fracture of unspecified part of neck of left femur, subsequent encounter for closed fracture with routine healing: Secondary | ICD-10-CM | POA: Diagnosis not present

## 2022-04-11 DIAGNOSIS — Z9181 History of falling: Secondary | ICD-10-CM | POA: Diagnosis not present

## 2022-04-11 DIAGNOSIS — Z96642 Presence of left artificial hip joint: Secondary | ICD-10-CM | POA: Diagnosis not present

## 2022-04-11 DIAGNOSIS — R262 Difficulty in walking, not elsewhere classified: Secondary | ICD-10-CM | POA: Diagnosis not present

## 2022-04-14 DIAGNOSIS — M6281 Muscle weakness (generalized): Secondary | ICD-10-CM | POA: Diagnosis not present

## 2022-04-14 DIAGNOSIS — Z96642 Presence of left artificial hip joint: Secondary | ICD-10-CM | POA: Diagnosis not present

## 2022-04-14 DIAGNOSIS — S72002D Fracture of unspecified part of neck of left femur, subsequent encounter for closed fracture with routine healing: Secondary | ICD-10-CM | POA: Diagnosis not present

## 2022-04-14 DIAGNOSIS — Z9181 History of falling: Secondary | ICD-10-CM | POA: Diagnosis not present

## 2022-04-14 DIAGNOSIS — R262 Difficulty in walking, not elsewhere classified: Secondary | ICD-10-CM | POA: Diagnosis not present

## 2022-04-16 ENCOUNTER — Encounter (HOSPITAL_BASED_OUTPATIENT_CLINIC_OR_DEPARTMENT_OTHER): Payer: Medicare PPO | Attending: Physician Assistant | Admitting: Physician Assistant

## 2022-04-16 DIAGNOSIS — E440 Moderate protein-calorie malnutrition: Secondary | ICD-10-CM | POA: Diagnosis not present

## 2022-04-16 DIAGNOSIS — I110 Hypertensive heart disease with heart failure: Secondary | ICD-10-CM | POA: Diagnosis not present

## 2022-04-16 DIAGNOSIS — J449 Chronic obstructive pulmonary disease, unspecified: Secondary | ICD-10-CM | POA: Insufficient documentation

## 2022-04-16 DIAGNOSIS — I2510 Atherosclerotic heart disease of native coronary artery without angina pectoris: Secondary | ICD-10-CM | POA: Insufficient documentation

## 2022-04-16 DIAGNOSIS — Z96642 Presence of left artificial hip joint: Secondary | ICD-10-CM | POA: Diagnosis not present

## 2022-04-16 DIAGNOSIS — L89623 Pressure ulcer of left heel, stage 3: Secondary | ICD-10-CM | POA: Insufficient documentation

## 2022-04-16 DIAGNOSIS — I5042 Chronic combined systolic (congestive) and diastolic (congestive) heart failure: Secondary | ICD-10-CM | POA: Diagnosis not present

## 2022-04-16 DIAGNOSIS — Z6824 Body mass index (BMI) 24.0-24.9, adult: Secondary | ICD-10-CM | POA: Diagnosis not present

## 2022-04-16 DIAGNOSIS — Z9181 History of falling: Secondary | ICD-10-CM | POA: Diagnosis not present

## 2022-04-16 DIAGNOSIS — S72002D Fracture of unspecified part of neck of left femur, subsequent encounter for closed fracture with routine healing: Secondary | ICD-10-CM | POA: Diagnosis not present

## 2022-04-16 DIAGNOSIS — M6281 Muscle weakness (generalized): Secondary | ICD-10-CM | POA: Diagnosis not present

## 2022-04-16 DIAGNOSIS — R262 Difficulty in walking, not elsewhere classified: Secondary | ICD-10-CM | POA: Diagnosis not present

## 2022-04-16 NOTE — Progress Notes (Addendum)
DAMETRIUS, HAGOPIAN ([id number]) Visit Report for 04/16/2022 Chief Complaint Document Details Patient Name: Date of Service: [name] 04/16/2022 2:45 PM Medical Record Number: [id number] Patient Account Number: [id number] Date of Birth/Sex: Treating RN: 1928/03/31 (88 y.o. [name] Primary Care Provider: Seward Carol Other Clinician: Referring Provider: Treating Provider/Extender: Jennye Moccasin in Treatment: 7 Information Obtained from: Patient Chief Complaint Pressure ulcer left heel Electronic Signature(s) Signed: 04/16/2022 3:07:37 PM By: Worthy Keeler PA-C Entered By: Worthy Keeler on 04/16/2022 15:07:36 -------------------------------------------------------------------------------- Debridement Details Patient Name: Date of Service: [name], [name] 04/16/2022 2:45 PM Medical Record Number: [id number] Patient Account Number: [id number] Date of Birth/Sex: Treating RN: 1928/05/22 (88 y.o. [name] Primary Care Provider: Seward Carol Other Clinician: Referring Provider: Treating Provider/Extender: Jennye Moccasin in Treatment: 7 Debridement Performed for Assessment: Wound #1 Left Calcaneus Performed By: Physician Worthy Keeler, PA Debridement Type: Debridement Level of Consciousness (Pre-procedure): Awake and Alert Pre-procedure Verification/Time Out Yes - 05:34 Taken: Start Time: 15:35 Pain Control: Lidocaine 4% T opical Solution T Area Debrided (L x W): otal 1.8 (cm) x 1.6 (cm) = 2.88 (cm) Tissue and other material debrided: Non-Viable, Callus, Slough, Subcutaneous, Slough Level: Skin/Subcutaneous Tissue Debridement Description: Excisional Instrument: Curette Bleeding: Minimum Hemostasis Achieved: Pressure End Time: 15:38 Response to Treatment: Procedure was tolerated well Level of Consciousness (Post- Awake and Alert procedure): Post Debridement Measurements of Total Wound Length: (cm)  1.8 Stage: Category/Stage III Width: (cm) 1.6 Depth: (cm) 0.2 Volume: (cm) 0.452 Character of Wound/Ulcer Post Debridement: Stable Post Procedure Diagnosis Same as Pre-procedure Electronic Signature(s) Signed: 04/16/2022 5:04:53 PM By: Worthy Keeler PA-C Signed: 04/16/2022 5:05:50 PM By: Lorrin Jackson Entered By: Lorrin Jackson on 04/16/2022 15:38:26 -------------------------------------------------------------------------------- HPI Details Patient Name: Date of Service: [name], [name] 04/16/2022 2:45 PM Medical Record Number: [id number] Patient Account Number: [id number] Date of Birth/Sex: Treating RN: [birth year] (88 y.o. [name] Primary Care Provider: Seward Carol Other Clinician: Referring Provider: Treating Provider/Extender: Jennye Moccasin in Treatment: 7 History of Present Illness HPI Description: 02-26-2022 upon evaluation today patient presents for initial inspection here in our clinic concerning issues that he has been having with his left heel he has a pressure ulcer that ensued following having fallen and fractured his hip. This actually occurred in April and he had surgery for total hip replacement on the left of December 24, 2021. He was discharged to a SNF at the point of leaving the hospital and it sometime during the hospital stay and SNF stay he developed a pressure ulcer to his left heel. At this point it does not seem to be getting any worse he is at [facility] independent living and they need orders to know what to do. Right now they do think that they can do the dressing changes but he needs to have the supplies and the recommendations on hand. He did have an x-ray performed on 02-13-2022 which was negative for osteomyelitis this is good news right now they just been using a dry dressing and that is good. Patient does have a history of a left hip replacement total, hypertension, coronary artery disease, COPD, congestive heart failure,  and protein calorie malnutrition as noted by low serum albumin and protein. 03-05-2022 upon evaluation today patient appears to be doing well currently in regard to his heel. Unfortunately the dressing that we put on is the one that came back in  with. The facility which was supposed to be changing this for him we got all the supplies for them but they never actually changed the dressing. With that being said this obviously is frustrating for the family who states they are definitely can reach out to the facility regard. They will get that straightened out. 03-12-2022 upon evaluation today patient appears to be doing well with regard to his wound although he still is not getting this changed through the week. He is pretty much staying off only put on 20 gets back. Obviously this is not the way we wanted to proceed with this but nonetheless we had a hard time getting home health out to do what they needed to do. We are going to follow-up with a phone call and try to see what the deal is as far as them receiving our orders. We faxed them but unfortunately they do not seem to get them they say to see if there is a different fax number. 03-19-2022 upon evaluation today patient appears to be doing well currently in regard to his wound. In fact this has some slough buildup but seems to be doing quite well. I do not see any evidence of active infection at this time which is great news. No fevers, chills, nausea, vomiting, or diarrhea. 03-26-2022 upon evaluation today patient's wound is showing signs of improvement. Fortunately there does not appear to be any evidence of active infection locally or systemically at this time which is great news. No fevers, chills, nausea, vomiting, or diarrhea. 04-02-2022 upon evaluation today patient appears to be doing well currently in regard to his wound. He is actually showing signs of improvement which is great news. Fortunately I do not see any evidence of active infection  locally or systemically. There was a lot of dry skin around the edges of the wound once I remove this and actually clean the surface of the wound this is measuring significantly smaller which is great news. I am very pleased in that regard. 04-16-2022 upon evaluation today patient appears to be doing well currently in regard to his wound. He has been tolerating the dressing changes there is been some need for sharp debridement today. Fortunately I do not see any evidence of active infection locally or systemically at this time. Electronic Signature(s) Signed: 04/16/2022 3:48:00 PM By: Lenda Kelp PA-C Entered By: Lenda Kelp on 04/16/2022 15:48:00 -------------------------------------------------------------------------------- Physical Exam Details Patient Name: Date of Service: [name] 04/16/2022 2:45 PM Medical Record Number: [id number] Patient Account Number: [id number] Date of Birth/Sex: Treating RN: 10-29-1927 (88 y.o. Lytle Michaels Primary Care Provider: Renford Dills Other Clinician: Referring Provider: Treating Provider/Extender: Prudencio Burly in Treatment: 7 Constitutional Well-nourished and well-hydrated in no acute distress. Respiratory normal breathing without difficulty. Psychiatric this patient is able to make decisions and demonstrates good insight into disease process. Alert and Oriented x 3. pleasant and cooperative. Notes Upon inspection patient's wound bed showed some slough and biofilm noted I am to have to perform sharp debridement clearway some of the necrotic debris tolerated that debridement today without complication and postdebridement the wound bed is significantly smaller. It also is much cleaner. Electronic Signature(s) Signed: 04/16/2022 3:48:16 PM By: Lenda Kelp PA-C Entered By: Lenda Kelp on 04/16/2022 15:48:16 -------------------------------------------------------------------------------- Physician Orders  Details Patient Name: Date of Service: [name], [name] 04/16/2022 2:45 PM Medical Record Number: [id number] Patient Account Number: [id number] Date of Birth/Sex: Treating RN: 1928/01/28 (  88 y.o. [name] Primary Care Provider: Seward Carol Other Clinician: Referring Provider: Treating Provider/Extender: Jennye Moccasin in Treatment: 7 Verbal / Phone Orders: No Diagnosis Coding ICD-10 Coding Code Description [phone] Pressure ulcer of left heel, stage 3 Z96.642 Presence of left artificial hip joint I10 Essential (primary) hypertension I25.10 Atherosclerotic heart disease of native coronary artery without angina pectoris J44.9 Chronic obstructive pulmonary disease, unspecified I50.42 Chronic combined systolic (congestive) and diastolic (congestive) heart failure E44.0 Moderate protein-calorie malnutrition Follow-up Appointments ppointment in 2 weeks. - 04/30/22 @ 2:45pm with Orland Jarred, RN (Room 7) Return A Bathing/ Shower/ Hygiene May shower with protection but do not get wound dressing(s) wet. - May use cast protector bag from [employer], [place] or [facility] on days dressing not being changed. May shower and wash wound with soap and water. - May wash with antibacterial soap when changing dressing. Edema Control - Lymphedema / SCD / Other Elevate legs to the level of the heart or above for 30 minutes daily and/or when sitting, a frequency of: - throughout the day Avoid standing for long periods of time. Off-Loading Other: - Avoid/Limit pressure to heel. Recommend wearing a sandal that does not rub heel area. Additional Orders / Instructions Follow Nutritious Diet - High Protein Diet: 100g of Protein daily Other: - May participate in PT if has appropriate footwear. Non Wound Condition pply the following to affected area as directed: - Apply Antifungal/Athletes Foot cream to right foot. A Wound Treatment Wound #1 - Calcaneus Wound Laterality:  Left Cleanser: Soap and Water (Home Health) 3 x Per Week/30 Days Discharge Instructions: Wash with Dial Antibacterial Soap Prim Dressing: Hydrofera Blue Ready Foam, 4x5 in (Home Health) (Generic) 3 x Per Week/30 Days ary Discharge Instructions: Apply to wound bed as instructed Secondary Dressing: Woven Gauze Sponge, Non-Sterile 4x4 in (Home Health) (Generic) 3 x Per Week/30 Days Discharge Instructions: Apply over primary dressing as directed. Secured With: [employer], 4.5x3.1 (in/yd) (Home Health) (Generic) 3 x Per Week/30 Days Discharge Instructions: Secure with Kerlix as directed. Secured With: 88M Medipore H Soft Cloth Surgical T ape, 4 x 10 (in/yd) (Home Health) (Generic) 3 x Per Week/30 Days Discharge Instructions: Secure with tape as directed. Electronic Signature(s) Signed: 04/16/2022 5:04:53 PM By: Worthy Keeler PA-C Signed: 04/16/2022 5:05:50 PM By: Lorrin Jackson Entered By: Lorrin Jackson on 04/16/2022 15:41:23 -------------------------------------------------------------------------------- Problem List Details Patient Name: Date of Service: [name], [name] 04/16/2022 2:45 PM Medical Record Number: [id number] Patient Account Number: [id number] Date of Birth/Sex: Treating RN: Sep 29, 1927 (88 y.o. [name] Primary Care Provider: Seward Carol Other Clinician: Referring Provider: Treating Provider/Extender: Jennye Moccasin in Treatment: 7 Active Problems ICD-10 Encounter Code Description Active Date MDM Diagnosis [phone] Pressure ulcer of left heel, stage 3 02/26/2022 No Yes Z96.642 Presence of left artificial hip joint 02/26/2022 No Yes I10 Essential (primary) hypertension 02/26/2022 No Yes I25.10 Atherosclerotic heart disease of native coronary artery without angina pectoris 02/26/2022 No Yes J44.9 Chronic obstructive pulmonary disease, unspecified 02/26/2022 No Yes I50.42 Chronic combined systolic (congestive) and diastolic (congestive)  heart failure 02/26/2022 No Yes E44.0 Moderate protein-calorie malnutrition 02/26/2022 No Yes Inactive Problems Resolved Problems Electronic Signature(s) Signed: 04/16/2022 3:07:31 PM By: Worthy Keeler PA-C Entered By: Worthy Keeler on 04/16/2022 15:07:31 -------------------------------------------------------------------------------- Progress Note Details Patient Name: Date of Service: [name], [name]. 04/16/2022 2:45 PM Medical Record Number: [id number] Patient Account Number: [id number] Date of Birth/Sex: Treating RN: Jun 25, 1928 (  88 y.o. Lytle Michaels Primary Care Provider: Renford Dills Other Clinician: Referring Provider: Treating Provider/Extender: Prudencio Burly in Treatment: 7 Subjective Chief Complaint Information obtained from Patient Pressure ulcer left heel History of Present Illness (HPI) 02-26-2022 upon evaluation today patient presents for initial inspection here in our clinic concerning issues that he has been having with his left heel he has a pressure ulcer that ensued following having fallen and fractured his hip. This actually occurred in April and he had surgery for total hip replacement on the left of December 24, 2021. He was discharged to a SNF at the point of leaving the hospital and it sometime during the hospital stay and SNF stay he developed a pressure ulcer to his left heel. At this point it does not seem to be getting any worse he is at [facility] independent living and they need orders to know what to do. Right now they do think that they can do the dressing changes but he needs to have the supplies and the recommendations on hand. He did have an x-ray performed on 02-13-2022 which was negative for osteomyelitis this is good news right now they just been using a dry dressing and that is good. Patient does have a history of a left hip replacement total, hypertension, coronary artery disease, COPD, congestive heart failure, and protein  calorie malnutrition as noted by low serum albumin and protein. 03-05-2022 upon evaluation today patient appears to be doing well currently in regard to his heel. Unfortunately the dressing that we put on is the one that came back in with. The facility which was supposed to be changing this for him we got all the supplies for them but they never actually changed the dressing. With that being said this obviously is frustrating for the family who states they are definitely can reach out to the facility regard. They will get that straightened out. 03-12-2022 upon evaluation today patient appears to be doing well with regard to his wound although he still is not getting this changed through the week. He is pretty much staying off only put on 20 gets back. Obviously this is not the way we wanted to proceed with this but nonetheless we had a hard time getting home health out to do what they needed to do. We are going to follow-up with a phone call and try to see what the deal is as far as them receiving our orders. We faxed them but unfortunately they do not seem to get them they say to see if there is a different fax number. 03-19-2022 upon evaluation today patient appears to be doing well currently in regard to his wound. In fact this has some slough buildup but seems to be doing quite well. I do not see any evidence of active infection at this time which is great news. No fevers, chills, nausea, vomiting, or diarrhea. 03-26-2022 upon evaluation today patient's wound is showing signs of improvement. Fortunately there does not appear to be any evidence of active infection locally or systemically at this time which is great news. No fevers, chills, nausea, vomiting, or diarrhea. 04-02-2022 upon evaluation today patient appears to be doing well currently in regard to his wound. He is actually showing signs of improvement which is great news. Fortunately I do not see any evidence of active infection locally or  systemically. There was a lot of dry skin around the edges of the wound once I remove this and actually clean the  surface of the wound this is measuring significantly smaller which is great news. I am very pleased in that regard. 04-16-2022 upon evaluation today patient appears to be doing well currently in regard to his wound. He has been tolerating the dressing changes there is been some need for sharp debridement today. Fortunately I do not see any evidence of active infection locally or systemically at this time. Objective Constitutional Well-nourished and well-hydrated in no acute distress. Vitals Time Taken: 3:12 PM, Height: 75 in, Weight: 192 lbs, BMI: 24, Temperature: 98.4 F, Pulse: 51 bpm, Respiratory Rate: 20 breaths/min, Blood Pressure: 161/89 mmHg. Respiratory normal breathing without difficulty. Psychiatric this patient is able to make decisions and demonstrates good insight into disease process. Alert and Oriented x 3. pleasant and cooperative. General Notes: Upon inspection patient's wound bed showed some slough and biofilm noted I am to have to perform sharp debridement clearway some of the necrotic debris tolerated that debridement today without complication and postdebridement the wound bed is significantly smaller. It also is much cleaner. Integumentary (Hair, Skin) Wound #1 status is Open. Original cause of wound was Pressure Injury. The date acquired was: 01/08/2022. The wound has been in treatment 7 weeks. The wound is located on the Left Calcaneus. The wound measures 1.8cm length x 1.6cm width x 0.2cm depth; 2.262cm^2 area and 0.452cm^3 volume. There is Fat Layer (Subcutaneous Tissue) exposed. There is no tunneling or undermining noted. There is a medium amount of serosanguineous drainage noted. The wound margin is distinct with the outline attached to the wound base. There is large (67-100%) red, pink granulation within the wound bed. There is a small (1-33%) amount of  necrotic tissue within the wound bed including Adherent Slough. 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 There was a Excisional Skin/Subcutaneous Tissue Debridement with a total area of 2.88 sq cm performed by Worthy Keeler, PA. With the following instrument(s): Curette to remove Non-Viable tissue/material. Material removed includes Callus, Subcutaneous Tissue, and Slough after achieving pain control using Lidocaine 4% T opical Solution. No specimens were taken. A time out was conducted at 05:34, prior to the start of the procedure. A Minimum amount of bleeding was controlled with Pressure. The procedure was tolerated well. Post Debridement Measurements: 1.8cm length x 1.6cm width x 0.2cm depth; 0.452cm^3 volume. Post debridement Stage noted as Category/Stage III. Character of Wound/Ulcer Post Debridement is stable. Post procedure Diagnosis Wound #1: Same as Pre-Procedure Plan Follow-up Appointments: Return Appointment in 2 weeks. - 04/30/22 @ 2:45pm with Orland Jarred, RN (Room 7) Bathing/ Shower/ Hygiene: May shower with protection but do not get wound dressing(s) wet. - May use cast protector bag from [employer], [place] or [facility] on days dressing not being changed. May shower and wash wound with soap and water. - May wash with antibacterial soap when changing dressing. Edema Control - Lymphedema / SCD / Other: Elevate legs to the level of the heart or above for 30 minutes daily and/or when sitting, a frequency  of: - throughout the day Avoid standing for long periods of time. Off-Loading: Other: - Avoid/Limit pressure to heel. Recommend wearing a sandal that does not  rub heel area. Additional Orders / Instructions: Follow Nutritious Diet - High Protein Diet: 100g of Protein daily Other: - May participate in PT if has appropriate footwear. Non Wound Condition: Apply the following to affected area as directed: - Apply Antifungal/Athletes Foot cream to right foot. WOUND #1: - Calcaneus Wound Laterality: Left Cleanser: Soap and Water (Home Health) 3 x Per Week/30 Days Discharge Instructions: Wash with Dial Antibacterial Soap Prim Dressing: Hydrofera Blue Ready Foam, 4x5 in (Home Health) (Generic) 3 x Per Week/30 Days ary Discharge Instructions: Apply to wound bed as instructed Secondary Dressing: Woven Gauze Sponge, Non-Sterile 4x4 in (Home Health) (Generic) 3 x Per Week/30 Days Discharge Instructions: Apply over primary dressing as directed. Secured With: [employer], 4.5x3.1 (in/yd) (Home Health) (Generic) 3 x Per Week/30 Days Discharge Instructions: Secure with Kerlix as directed. Secured With: 55M Medipore H Soft Cloth Surgical T ape, 4 x 10 (in/yd) (Home Health) (Generic) 3 x Per Week/30 Days Discharge Instructions: Secure with tape as directed. 1. I would recommend that we going continue with the wound care measures as before and the patient is in agreement with plan this includes the use of the [facility] dressing which I think is still good to be the best way to go. 2. We will continue with the roll gauze to secure in place. 3. He is also can continue with the [employer] Shoe's which seem to be doing well. We will see patient back for reevaluation in 2 weeks here in the clinic. If anything worsens or changes patient will contact our office for additional recommendations. Electronic Signature(s) Signed: 04/16/2022 3:48:47 PM By: Lenda Kelp PA-C Entered By: Lenda Kelp on  04/16/2022 15:48:46 -------------------------------------------------------------------------------- SuperBill Details Patient Name: Date of Service: [name], [name] 04/16/2022 Medical Record Number: [id number] Patient Account Number: [id number] Date of Birth/Sex: Treating RN: 1928/05/06 (88 y.o. Lytle Michaels Primary Care Provider: Renford Dills Other Clinician: Referring Provider: Treating Provider/Extender: Prudencio Burly in Treatment: 7 Diagnosis Coding ICD-10 Codes Code Description [phone] Pressure ulcer of left heel, stage 3 Z96.642 Presence of left artificial hip joint I10 Essential (primary) hypertension I25.10 Atherosclerotic heart disease of native coronary artery without angina pectoris J44.9 Chronic obstructive pulmonary disease, unspecified I50.42 Chronic combined systolic (congestive) and diastolic (congestive) heart failure E44.0 Moderate protein-calorie malnutrition Facility Procedures CPT4 Code: [id number] Description: 11042 - DEB SUBQ TISSUE 20 SQ CM/< ICD-10 Diagnosis Description L89.623 Pressure ulcer of left heel, stage 3 Modifier: Quantity: 1 Physician Procedures : CPT4 Code Description Modifier [id number] 11042 - WC PHYS SUBQ TISS 20 SQ CM ICD-10 Diagnosis Description L89.623 Pressure ulcer of left heel, stage 3 Quantity: 1 Electronic Signature(s) Signed: 04/16/2022 3:54:01 PM By: Lenda Kelp PA-C Entered By: Lenda Kelp on 04/16/2022 15:54:01

## 2022-04-16 NOTE — Progress Notes (Signed)
DRAGAN, TAMBURRINO ([id number]) Visit Report for 04/16/2022 Arrival Information Details Patient Name: Date of Service: Hector Beltran 04/16/2022 2:45 PM Medical Record Number: [id number] Patient Account Number: [id number] Date of Birth/Sex: Treating RN: 09-10-1928 (88 y.o. Hector Beltran Primary Care Hector Beltran: Hector Beltran Other Clinician: Referring Hector Beltran: Treating Hector Beltran in Treatment: 7 Visit Information History Since Last Visit Added or deleted any medications: No Patient Arrived: Wheel Chair Any new allergies or adverse reactions: No Arrival Time: 15:03 Had a fall or experienced change in No Accompanied By: son, daughter in law activities of daily living that may affect Transfer Assistance: Manual risk of falls: Patient Identification Verified: Yes Signs or symptoms of abuse/neglect since last visito No Secondary Verification Process Completed: Yes Hospitalized since last visit: No Patient Requires Transmission-Based Precautions: No Implantable device outside of the clinic excluding No Patient Has Alerts: No cellular tissue based products placed in the center since last visit: Has Dressing in Place as Prescribed: Yes Pain Present Now: No Electronic Signature(s) Signed: 04/16/2022 5:05:50 PM By: Hector Beltran Entered By: Hector Beltran on 04/16/2022 15:07:55 -------------------------------------------------------------------------------- Encounter Discharge Information Details Patient Name: Date of Service: Hector Beltran, Hector Beltran. 04/16/2022 2:45 PM Medical Record Number: [id number] Patient Account Number: [id number] Date of Birth/Sex: Treating RN: 09/08/1928 (88 y.o. Hector Beltran Primary Care Tully Burgo: Hector Beltran Other Clinician: Referring Emmary Culbreath: Treating Torry Adamczak/Extender: Hector Beltran in Treatment: 7 Encounter Discharge Information Items Post Procedure Vitals Discharge Condition:  Stable Temperature (F): 98.4 Ambulatory Status: Wheelchair Pulse (bpm): 51 Discharge Destination: Home Respiratory Rate (breaths/min): 20 Transportation: Private Auto Blood Pressure (mmHg): 161/89 Accompanied By: Shari Heritage, daughter in law Schedule Follow-up Appointment: Yes Clinical Summary of Care: Provided on 04/16/2022 Form Type Recipient Paper Patient Patient Electronic Signature(s) Signed: 04/16/2022 5:05:50 PM By: Hector Beltran Entered By: Hector Beltran on 04/16/2022 15:43:23 -------------------------------------------------------------------------------- Lower Extremity Assessment Details Patient Name: Date of Service: Hector Beltran, Hector Beltran 04/16/2022 2:45 PM Medical Record Number: [id number] Patient Account Number: [id number] Date of Birth/Sex: Treating RN: [birth year] (88 y.o. Hector Beltran Primary Care Eron Goble: Hector Beltran Other Clinician: Referring Hector Beltran: Treating Hector Beltran/Extender: Hector Beltran in Treatment: 7 Edema Assessment Assessed: Hector Beltran: Yes] [Right: No] Edema: [Left: N] [Right: o] Calf Left: Right: Point of Measurement: 33 cm From Medial Instep 38 cm Ankle Left: Right: Point of Measurement: 10 cm From Medial Instep 25 cm Vascular Assessment Pulses: Dorsalis Pedis Palpable: [Left:Yes] Electronic Signature(s) Signed: 04/16/2022 5:05:50 PM By: Hector Beltran Entered By: Hector Beltran on 04/16/2022 15:24:57 -------------------------------------------------------------------------------- Multi-Disciplinary Care Plan Details Patient Name: Date of Service: Hector Beltran, Hector Beltran. 04/16/2022 2:45 PM Medical Record Number: [id number] Patient Account Number: [id number] Date of Birth/Sex: Treating RN: 17-Dec-1927 (88 y.o. Hector Beltran Primary Care Hector Beltran: Hector Beltran Other Clinician: Referring Hector Beltran: Treating Hector Beltran/Extender: Hector Beltran in Treatment: 7 Active Inactive Wound/Skin Impairment Nursing  Diagnoses: Impaired tissue integrity Goals: Patient/caregiver will verbalize understanding of skin care regimen Date Initiated: 02/26/2022 Target Resolution Date: 04/30/2022 Goal Status: Active Ulcer/skin breakdown will have a volume reduction of 30% by week 4 Date Initiated: 02/26/2022 Target Resolution Date: 04/30/2022 Goal Status: Active Interventions: Assess patient/caregiver ability to obtain necessary supplies Assess patient/caregiver ability to perform ulcer/skin care regimen upon admission and as needed Assess ulceration(s) every visit Provide education on ulcer and skin care Treatment Activities: Topical wound management initiated : 02/26/2022 Notes: 04/02/22: Wound care regimen continues. Electronic Signature(s) Signed: 04/16/2022  5:05:50 PM By: Lorrin Jackson Entered By: Lorrin Jackson on 04/16/2022 15:03:05 -------------------------------------------------------------------------------- Pain Assessment Details Patient Name: Date of Service: Hector Beltran 04/16/2022 2:45 PM Medical Record Number: [id number] Patient Account Number: [id number] Date of Birth/Sex: Treating RN: 1927-10-13 (88 y.o. Hector Beltran Primary Care Carolyn Beltran: Hector Beltran Other Clinician: Referring Hector Beltran: Treating Hector Beltran/Extender: Hector Beltran in Treatment: 7 Active Problems Location of Pain Severity and Description of Pain Patient Has Paino No Site Locations Pain Management and Medication Current Pain Management: Electronic Signature(s) Signed: 04/16/2022 5:05:50 PM By: Lorrin Jackson Entered By: Lorrin Jackson on 04/16/2022 15:08:03 -------------------------------------------------------------------------------- Patient/Caregiver Education Details Patient Name: Date of Service: Hector Beltran, Hector Beltran 8/2/2023andnbsp2:45 PM Medical Record Number: [id number] Patient Account Number: [id number] Date of Birth/Gender: Treating RN: February 26, 1928 (88 y.o. Hector Beltran Primary Care Physician: Hector Beltran Other Clinician: Referring Physician: Treating Physician/Extender: Hector Beltran in Treatment: 7 Education Assessment Education Provided To: Patient Education Topics Provided Wound/Skin Impairment: Methods: Demonstration, Explain/Verbal, Printed Responses: State content correctly [employer]) Signed: 04/16/2022 5:05:50 PM By: Lorrin Jackson Entered By: Lorrin Jackson on 04/16/2022 15:03:43 -------------------------------------------------------------------------------- Wound Assessment Details Patient Name: Date of Service: Hector Beltran, Hector Beltran. 04/16/2022 2:45 PM Medical Record Number: [id number] Patient Account Number: [id number] Date of Birth/Sex: Treating RN: 24-Nov-1927 (88 y.o. Hector Beltran Primary Care Belen Pesch: Hector Beltran Other Clinician: Referring Bryor Rami: Treating Constantin Hillery/Extender: Hector Beltran in Treatment: 7 Wound Status Wound Number: 1 Primary Pressure Ulcer Etiology: Wound Location: Left Calcaneus Wound Open Wounding Event: Pressure Injury Status: Date Acquired: 01/08/2022 Comorbid Chronic Obstructive Pulmonary Disease (COPD), Congestive Weeks Of Treatment: 7 History: Heart Failure, Coronary Artery Disease, Hypertension, Cirrhosis , Clustered Wound: No Neuropathy Photos Wound Measurements Length: (cm) 1.8 Width: (cm) 1.6 Depth: (cm) 0.2 Area: (cm) 2.262 Volume: (cm) 0.452 % Reduction in Area: 68% % Reduction in Volume: 68% Epithelialization: Small (1-33%) Tunneling: No Undermining: No Wound Description Classification: Category/Stage III Wound Margin: Distinct, outline attached Exudate Amount: Medium Exudate Type: Serosanguineous Exudate Color: red, brown Wound Bed Granulation Amount: Large (67-100%) Granulation Quality: Red, Pink Necrotic Amount: Small (1-33%) Necrotic Quality: Adherent Slough Foul Odor After Cleansing:  No Slough/Fibrino Yes Exposed Structure Fascia Exposed: No Fat Layer (Subcutaneous Tissue) Exposed: Yes Tendon Exposed: No Muscle Exposed: No Joint Exposed: No Bone Exposed: No Assessment Notes Callous noted Treatment Notes Wound #1 (Calcaneus) Wound Laterality: Left Cleanser Soap and Water Discharge Instruction: Wash with Dial Antibacterial Soap Peri-Wound Care Topical Primary Dressing Hydrofera Blue Ready Foam, 4x5 in Discharge Instruction: Apply to wound bed as instructed Secondary Dressing Woven Gauze Sponge, Non-Sterile 4x4 in Discharge Instruction: Apply over primary dressing as directed. Secured With [employer], 4.5x3.1 (in/yd) Discharge Instruction: Secure with Kerlix as directed. 76M Medipore H Soft Cloth Surgical T ape, 4 x 10 (in/yd) Discharge Instruction: Secure with tape as directed. Compression Wrap Compression Stockings Add-Ons Electronic Signature(s) Signed: 04/16/2022 5:05:50 PM By: Lorrin Jackson Entered By: Lorrin Jackson on 04/16/2022 15:21:18 -------------------------------------------------------------------------------- Vitals Details Patient Name: Date of Service: Hector Beltran, Hector RRY N. 04/16/2022 2:45 PM Medical Record Number: [id number] Patient Account Number: [id number] Date of Birth/Sex: Treating RN: [birth year] (88 y.o. Hector Beltran Primary Care Kenai Fluegel: Hector Beltran Other Clinician: Referring Tahani Potier: Treating Kamia Insalaco/Extender: Hector Beltran in Treatment: 7 Vital Signs Time Taken: 15:12 Temperature (F): 98.4 Height (in): 75 Pulse (bpm): 51 Weight (lbs): 192 Respiratory Rate (breaths/min): 20 Body Mass Index (BMI): 24 Blood Pressure (  mmHg): 161/89 Reference Range: 80 - 120 mg / dl Electronic Signature(s) Signed: 04/16/2022 5:05:50 PM By: Hector Beltran Entered By: Hector Beltran on 04/16/2022 15:15:18

## 2022-04-17 DIAGNOSIS — Z9181 History of falling: Secondary | ICD-10-CM | POA: Diagnosis not present

## 2022-04-17 DIAGNOSIS — R262 Difficulty in walking, not elsewhere classified: Secondary | ICD-10-CM | POA: Diagnosis not present

## 2022-04-17 DIAGNOSIS — M6281 Muscle weakness (generalized): Secondary | ICD-10-CM | POA: Diagnosis not present

## 2022-04-17 DIAGNOSIS — S72002D Fracture of unspecified part of neck of left femur, subsequent encounter for closed fracture with routine healing: Secondary | ICD-10-CM | POA: Diagnosis not present

## 2022-04-17 DIAGNOSIS — Z96642 Presence of left artificial hip joint: Secondary | ICD-10-CM | POA: Diagnosis not present

## 2022-04-20 DIAGNOSIS — M6281 Muscle weakness (generalized): Secondary | ICD-10-CM | POA: Diagnosis not present

## 2022-04-20 DIAGNOSIS — Z9181 History of falling: Secondary | ICD-10-CM | POA: Diagnosis not present

## 2022-04-20 DIAGNOSIS — S72002D Fracture of unspecified part of neck of left femur, subsequent encounter for closed fracture with routine healing: Secondary | ICD-10-CM | POA: Diagnosis not present

## 2022-04-20 DIAGNOSIS — R262 Difficulty in walking, not elsewhere classified: Secondary | ICD-10-CM | POA: Diagnosis not present

## 2022-04-20 DIAGNOSIS — Z96642 Presence of left artificial hip joint: Secondary | ICD-10-CM | POA: Diagnosis not present

## 2022-04-21 ENCOUNTER — Emergency Department (HOSPITAL_BASED_OUTPATIENT_CLINIC_OR_DEPARTMENT_OTHER): Payer: Medicare PPO

## 2022-04-21 ENCOUNTER — Emergency Department (HOSPITAL_BASED_OUTPATIENT_CLINIC_OR_DEPARTMENT_OTHER)
Admission: EM | Admit: 2022-04-21 | Discharge: 2022-04-21 | Disposition: A | Discharge status: Home / Self Care | Payer: Medicare PPO | Attending: Emergency Medicine | Admitting: Emergency Medicine

## 2022-04-21 ENCOUNTER — Other Ambulatory Visit: Payer: Self-pay

## 2022-04-21 ENCOUNTER — Emergency Department (HOSPITAL_BASED_OUTPATIENT_CLINIC_OR_DEPARTMENT_OTHER): Payer: Medicare PPO | Admitting: Radiology

## 2022-04-21 DIAGNOSIS — W19XXXA Unspecified fall, initial encounter: Secondary | ICD-10-CM

## 2022-04-21 DIAGNOSIS — M545 Low back pain, unspecified: Secondary | ICD-10-CM | POA: Diagnosis not present

## 2022-04-21 DIAGNOSIS — S0093XA Contusion of unspecified part of head, initial encounter: Secondary | ICD-10-CM | POA: Diagnosis not present

## 2022-04-21 DIAGNOSIS — M47812 Spondylosis without myelopathy or radiculopathy, cervical region: Secondary | ICD-10-CM | POA: Diagnosis not present

## 2022-04-21 DIAGNOSIS — W01198A Fall on same level from slipping, tripping and stumbling with subsequent striking against other object, initial encounter: Secondary | ICD-10-CM | POA: Insufficient documentation

## 2022-04-21 DIAGNOSIS — R531 Weakness: Secondary | ICD-10-CM | POA: Diagnosis not present

## 2022-04-21 DIAGNOSIS — I10 Essential (primary) hypertension: Secondary | ICD-10-CM | POA: Diagnosis not present

## 2022-04-21 DIAGNOSIS — N390 Urinary tract infection, site not specified: Secondary | ICD-10-CM | POA: Diagnosis not present

## 2022-04-21 DIAGNOSIS — S0003XA Contusion of scalp, initial encounter: Secondary | ICD-10-CM | POA: Diagnosis not present

## 2022-04-21 DIAGNOSIS — M549 Dorsalgia, unspecified: Secondary | ICD-10-CM | POA: Diagnosis not present

## 2022-04-21 DIAGNOSIS — Z9181 History of falling: Secondary | ICD-10-CM | POA: Diagnosis not present

## 2022-04-21 DIAGNOSIS — R0902 Hypoxemia: Secondary | ICD-10-CM | POA: Diagnosis not present

## 2022-04-21 DIAGNOSIS — M4802 Spinal stenosis, cervical region: Secondary | ICD-10-CM | POA: Diagnosis not present

## 2022-04-21 DIAGNOSIS — I6782 Cerebral ischemia: Secondary | ICD-10-CM | POA: Diagnosis not present

## 2022-04-21 DIAGNOSIS — S0990XA Unspecified injury of head, initial encounter: Secondary | ICD-10-CM | POA: Diagnosis present

## 2022-04-21 DIAGNOSIS — M6281 Muscle weakness (generalized): Secondary | ICD-10-CM | POA: Diagnosis not present

## 2022-04-21 LAB — COMPREHENSIVE METABOLIC PANEL
ALT: 13 U/L (ref 0–44)
AST: 22 U/L (ref 15–41)
Albumin: 4.2 g/dL (ref 3.5–5.0)
Alkaline Phosphatase: 65 U/L (ref 38–126)
Anion gap: 8 (ref 5–15)
BUN: 16 mg/dL (ref 8–23)
CO2: 30 mmol/L (ref 22–32)
Calcium: 8.8 mg/dL — ABNORMAL LOW (ref 8.9–10.3)
Chloride: 94 mmol/L — ABNORMAL LOW (ref 98–111)
Creatinine, Ser: 0.91 mg/dL (ref 0.61–1.24)
GFR, Estimated: >60 mL/min (ref >60)
Glucose, Bld: 103 mg/dL — ABNORMAL HIGH (ref 70–99)
Potassium: 4.5 mmol/L (ref 3.5–5.1)
Sodium: 132 mmol/L — ABNORMAL LOW (ref 135–145)
Total Bilirubin: 0.8 mg/dL (ref 0.3–1.2)
Total Protein: 7.6 g/dL (ref 6.5–8.1)

## 2022-04-21 LAB — CBC WITH DIFFERENTIAL/PLATELET
Abs Immature Granulocytes: 0.04 10*3/uL (ref 0.00–0.07)
Basophils Absolute: 0 10*3/uL (ref 0.0–0.1)
Basophils Relative: 0 %
Eosinophils Absolute: 0.2 10*3/uL (ref 0.0–0.5)
Eosinophils Relative: 2 %
HCT: 43.3 % (ref 39.0–52.0)
Hemoglobin: 14.6 g/dL (ref 13.0–17.0)
Immature Granulocytes: 0 %
Lymphocytes Relative: 11 %
Lymphs Abs: 1 10*3/uL (ref 0.7–4.0)
MCH: 31.6 pg (ref 26.0–34.0)
MCHC: 33.7 g/dL (ref 30.0–36.0)
MCV: 93.7 fL (ref 80.0–100.0)
Monocytes Absolute: 0.8 10*3/uL (ref 0.1–1.0)
Monocytes Relative: 9 %
Neutro Abs: 7.4 10*3/uL (ref 1.7–7.7)
Neutrophils Relative %: 78 %
Platelets: 178 10*3/uL (ref 150–400)
RBC: 4.62 MIL/uL (ref 4.22–5.81)
RDW: 13.1 % (ref 11.5–15.5)
WBC: 9.6 10*3/uL (ref 4.0–10.5)
nRBC: 0 % (ref 0.0–0.2)

## 2022-04-21 LAB — URINALYSIS, ROUTINE W REFLEX MICROSCOPIC
Bilirubin Urine: NEGATIVE
Glucose, UA: NEGATIVE mg/dL
Hgb urine dipstick: NEGATIVE
Ketones, ur: NEGATIVE mg/dL
Nitrite: POSITIVE — AB
Specific Gravity, Urine: 1.013 (ref 1.005–1.030)
pH: 7 (ref 5.0–8.0)

## 2022-04-21 MED ORDER — SODIUM CHLORIDE 0.9 % IV SOLN
1.0000 g | Freq: Once | INTRAVENOUS | Status: AC
  Administered 2022-04-21: 1 g via INTRAVENOUS
  Filled 2022-04-21: qty 10

## 2022-04-21 MED ORDER — CEPHALEXIN 500 MG PO CAPS: 500.0000 mg | ORAL_CAPSULE | Freq: Four times a day (QID) | ORAL | 0 refills | Status: AC

## 2022-04-21 NOTE — ED Triage Notes (Addendum)
Patient arrives with complaints of back pain after falling twice in the past 24 hours. Patient states that the he had a mechanical last night and this morning. Patient is a resident of [facility].   Patient does have a small bruise on his left eye and arms. Patient does not take blood thinners.   Patient also getting wound care for chronic left heel ulcer.

## 2022-04-21 NOTE — ED Notes (Signed)
Pt ambulated in hall with assistance of walker.  

## 2022-04-21 NOTE — Discharge Instructions (Addendum)
Return if any problems.

## 2022-04-21 NOTE — ED Notes (Signed)
Pt tolerated apple juice

## 2022-04-22 NOTE — ED Provider Notes (Incomplete)
[facility] EMERGENCY DEPT Provider Note   CSN: [id number] Arrival date & time: 04/21/22  1643     History {Add pertinent medical, surgical, social history, OB history to HPI:1} Chief Complaint  Patient presents with  . Back Pain  . Fall    Hector Beltran is a 88 y.o. male.  Pt complains of pain in his low back.  Pt reports falling earlier today.  Pt's son reports pt has fallen twice in the last 2 days.    The history is provided by the patient. No language interpreter was used.  Back Pain Quality:  Aching Radiates to:  Does not radiate Pain severity:  Moderate Pain is:  Worse during the day Timing:  Constant Progression:  Worsening Chronicity:  New Relieved by:  Nothing Worsened by:  Nothing Ineffective treatments:  None tried Associated symptoms: weakness   Associated symptoms: no abdominal pain and no fever   Risk factors: hx of osteoporosis   Fall Pertinent negatives include no abdominal pain.       Home Medications Prior to Admission medications   Medication Sig Start Date End Date Taking? Authorizing Provider  cephALEXin (KEFLEX) 500 MG capsule Take 1 capsule (500 mg total) by mouth 4 (four) times daily for 10 days. 04/21/22 05/01/22 Yes Cheron Schaumann K, PA-C  B Complex Vitamins (B COMPLEX PO) Take 1 tablet by mouth daily.    [provider]  Coenzyme Q10 (CO Q-10 PO) Take 1 capsule by mouth daily.    [provider]  docusate sodium (COLACE) 100 MG capsule Take 1 capsule (100 mg total) by mouth 2 (two) times daily. 12/31/21   Calvert Cantor, MD  furosemide (LASIX) 40 MG tablet Take 1 tablet (40 mg total) by mouth daily. 01/01/22   Calvert Cantor, MD  Omega-3 Fatty Acids (OMEGA 3 PO) Take 1 capsule by mouth daily.    [provider]      Allergies    Patient has no known allergies.    Review of Systems   Review of Systems  Constitutional:  Negative for fever.  Gastrointestinal:  Negative for abdominal pain.   Musculoskeletal:  Positive for back pain.  Neurological:  Positive for weakness.  All other systems reviewed and are negative.   Physical Exam Updated Vital Signs BP (!) 145/88 (BP Location: Right Arm)   Pulse 90   Temp 98.8 F (37.1 C) (Oral)   Resp 18   Ht 6\' 3"  (1.905 m)   Wt 90.3 kg   SpO2 95%   BMI 24.88 kg/m  Physical Exam Vitals and nursing note reviewed.  Constitutional:      Appearance: He is well-developed.  HENT:     Head: Normocephalic.  Eyes:     Extraocular Movements: Extraocular movements intact.     Pupils: Pupils are equal, round, and reactive to light.  Cardiovascular:     Rate and Rhythm: Normal rate.  Pulmonary:     Effort: Pulmonary effort is normal.  Abdominal:     General: There is no distension.  Musculoskeletal:        General: Normal range of motion.     Cervical back: Normal range of motion.  Skin:    General: Skin is warm.  Neurological:     General: No focal deficit present.     Mental Status: He is alert and oriented to person, place, and time.  Psychiatric:        Mood and Affect: Mood normal.     ED  Results / Procedures / Treatments   Labs (all labs ordered are listed, but only abnormal results are displayed) Labs Reviewed  URINALYSIS, ROUTINE W REFLEX MICROSCOPIC - Abnormal; Notable for the following components:      Result Value   APPearance HAZY (*)    Protein, ur TRACE (*)    Nitrite POSITIVE (*)    Leukocytes,Ua LARGE (*)    Bacteria, UA RARE (*)    All other components within normal limits  COMPREHENSIVE METABOLIC PANEL - Abnormal; Notable for the following components:   Sodium 132 (*)    Chloride 94 (*)    Glucose, Bld 103 (*)    Calcium 8.8 (*)    All other components within normal limits  URINE CULTURE  CBC WITH DIFFERENTIAL/PLATELET    EKG EKG Interpretation  Date/Time:  Monday April 21 2022 19:39:19 EDT Ventricular Rate:  102 PR Interval:    QRS Duration: 94 QT Interval:  339 QTC  Calculation: 442 R Axis:   64 Text Interpretation: Normal sinus rhythm RSR' in V1 or V2, probably normal variant No significant change was found Confirmed by Glynn Octave [phone]) on 04/21/2022 8:03:18 PM  Radiology DG Chest 2 View  Result Date: 04/21/2022 CLINICAL DATA:  Hypoxia EXAM: CHEST - 2 VIEW COMPARISON:  12/24/2021 FINDINGS: Lungs are clear. No pneumothorax or pleural effusion. Mild cardiomegaly is stable. Pulmonary vascularity is normal. No acute bone abnormality. IMPRESSION: Stable mild cardiomegaly. Electronically Signed   By: Helyn Numbers M.D.   On: 04/21/2022 19:38   CT Cervical Spine Wo Contrast  Result Date: 04/21/2022 CLINICAL DATA:  Larey Seat twice over the last day.  Back pain. EXAM: CT CERVICAL SPINE WITHOUT CONTRAST TECHNIQUE: Multidetector CT imaging of the cervical spine was performed without intravenous contrast. Multiplanar CT image reconstructions were also generated. RADIATION DOSE REDUCTION: This exam was performed according to the departmental dose-optimization program which includes automated exposure control, adjustment of the mA and/or kV according to patient size and/or use of iterative reconstruction technique. COMPARISON:  09/25/2021 FINDINGS: Alignment: Straightening and kyphotic curvature in the cervical region. No acute traumatic malalignment. Skull base and vertebrae: No regional fracture. Soft tissues and spinal canal: No traumatic soft tissue finding. Disc levels: Degenerative spondylosis and facet arthropathy throughout the cervical region. Spinal stenosis that could be significant from C2-3 through C6-7. Severe bony foraminal stenosis throughout the region. Upper chest: Mild apical scarring. Other: None IMPRESSION: No acute traumatic finding. Advanced chronic degenerative disease throughout the cervical region. Spinal stenosis from C2-3 through C6-7 that could be significant. Bony foraminal stenosis throughout the region that could cause radicular nerve compression.  Electronically Signed   By: Paulina Fusi M.D.   On: 04/21/2022 19:37   CT Lumbar Spine Wo Contrast  Result Date: 04/21/2022 CLINICAL DATA:  Larey Seat twice over the last 2 days.  Back pain. EXAM: CT LUMBAR SPINE WITHOUT CONTRAST TECHNIQUE: Multidetector CT imaging of the lumbar spine was performed without intravenous contrast administration. Multiplanar CT image reconstructions were also generated. RADIATION DOSE REDUCTION: This exam was performed according to the departmental dose-optimization program which includes automated exposure control, adjustment of the mA and/or kV according to patient size and/or use of iterative reconstruction technique. COMPARISON:  Radiography 05/03/2011 FINDINGS: Segmentation: 5 lumbar type vertebral bodies. Alignment: Straightening of the normal lumbar lordosis. Minimal scoliotic curvature convex to the left. Vertebrae: No acute fracture in the region from T11 through the sacrum. Chronic fusion of the vertebral bodies at L2 and L3. Probable solid bridging osteophytes from T11  through L1 and at L3-4. Paraspinal and other soft tissues: Negative Disc levels: T10-11: Disc degeneration and vacuum phenomenon. No compressive stenosis. T11 through L1: Solid bridging osteophytes.  No stenosis. L1-2: Mobile level with degenerative disc disease. Mild bulging of the disc. No compressive stenosis. L2-3: Chronic fusion. Mild chronic narrowing of the lateral recesses and foramina, not likely compressive. L3-4: Solid bridging anterior osteophytes. Bulging of the disc. Stenosis of both lateral recesses. L4-5: Mobile level. Disc degeneration with vacuum phenomenon. Facet and ligamentous hypertrophy. Severe multifactorial stenosis that could be symptomatic. L5-S1: Mobile level. Disc degeneration with vacuum phenomenon. Facet and ligamentous hypertrophy. Severe multifactorial stenosis that could be symptomatic. IMPRESSION: No acute or traumatic finding. Severe multifactorial spinal stenosis at L4-5 and  L5-S1 that could be symptomatic. Electronically Signed   By: Paulina Fusi M.D.   On: 04/21/2022 19:35   CT Head Wo Contrast  Result Date: 04/21/2022 CLINICAL DATA:  Larey Seat twice in the last day. EXAM: CT HEAD WITHOUT CONTRAST TECHNIQUE: Contiguous axial images were obtained from the base of the skull through the vertex without intravenous contrast. RADIATION DOSE REDUCTION: This exam was performed according to the departmental dose-optimization program which includes automated exposure control, adjustment of the mA and/or kV according to patient size and/or use of iterative reconstruction technique. COMPARISON:  09/25/2021 FINDINGS: Brain: Age related volume loss. Mild chronic small-vessel ischemic change of the hemispheric white matter. No sign of acute infarction, mass lesion, hemorrhage, hydrocephalus or extra-axial collection. Vascular: There is atherosclerotic calcification of the major vessels at the base of the brain. Skull: Negative Sinuses/Orbits: Clear/normal Other: None IMPRESSION: No acute or traumatic finding. Age related volume loss. Mild chronic small-vessel ischemic changes of the white matter. Electronically Signed   By: Paulina Fusi M.D.   On: 04/21/2022 19:27    Procedures Procedures  {Document cardiac monitor, telemetry assessment procedure when appropriate:1}  Medications Ordered in ED Medications  cefTRIAXone (ROCEPHIN) 1 g in sodium chloride 0.9 % 100 mL IVPB (1 g Intravenous New Bag/Given 04/21/22 2157)    ED Course/ Medical Decision Making/ A&P                           Medical Decision Making Pt complains of pain in his back after falling.  Pt did hit his head.  No loss of conciousness   Amount and/or Complexity of Data Reviewed Independent Historian:     Details: Pt here with son and daughter in law.  Pt lives at [facility]. living Labs: ordered. Radiology: ordered and independent interpretation performed. Decision-making details documented in ED Course. ECG/medicine  tests: ordered.    Details: Ct c spine, Ls spine and head  no acute injury  Risk Prescription drug management.     {Document critical care time when appropriate:1} {Document review of labs and clinical decision tools ie heart score, Chads2Vasc2 etc:1}  {Document your independent review of radiology images, and any outside records:1} {Document your discussion with family members, caretakers, and with consultants:1} {Document social determinants of health affecting pt's care:1} {Document your decision making why or why not admission, treatments were needed:1} Final Clinical Impression(s) / ED Diagnoses Final diagnoses:  Urinary tract infection without hematuria, site unspecified  Fall, initial encounter  Contusion of head, unspecified part of head, initial encounter    Rx / DC Orders ED Discharge Orders          Ordered    cephALEXin (KEFLEX) 500 MG capsule  4 times daily  04/21/22 2326            

## 2022-04-22 NOTE — ED Provider Notes (Signed)
[facility] EMERGENCY DEPT Provider Note   CSN: [id number] Arrival date & time: 04/21/22  1643     History  Chief Complaint  Patient presents with   Back Pain   Fall    Hector Beltran is a 88 y.o. male.  Pt complains of pain in his low back.  Pt reports falling earlier today.  Pt's son reports pt has fallen twice in the last 2 days.    The history is provided by the patient. No language interpreter was used.  Back Pain Quality:  Aching Radiates to:  Does not radiate Pain severity:  Moderate Pain is:  Worse during the day Timing:  Constant Progression:  Worsening Chronicity:  New Relieved by:  Nothing Worsened by:  Nothing Ineffective treatments:  None tried Associated symptoms: weakness   Associated symptoms: no abdominal pain and no fever   Risk factors: hx of osteoporosis   Fall Pertinent negatives include no abdominal pain.       Home Medications Prior to Admission medications   Medication Sig Start Date End Date Taking? Authorizing Provider  cephALEXin (KEFLEX) 500 MG capsule Take 1 capsule (500 mg total) by mouth 4 (four) times daily for 10 days. 04/21/22 05/01/22 Yes Cheron Schaumann K, PA-C  B Complex Vitamins (B COMPLEX PO) Take 1 tablet by mouth daily.    [provider]  Coenzyme Q10 (CO Q-10 PO) Take 1 capsule by mouth daily.    [provider]  docusate sodium (COLACE) 100 MG capsule Take 1 capsule (100 mg total) by mouth 2 (two) times daily. 12/31/21   Calvert Cantor, MD  furosemide (LASIX) 40 MG tablet Take 1 tablet (40 mg total) by mouth daily. 01/01/22   Calvert Cantor, MD  Omega-3 Fatty Acids (OMEGA 3 PO) Take 1 capsule by mouth daily.    [provider]      Allergies    Patient has no known allergies.    Review of Systems   Review of Systems  Constitutional:  Negative for fever.  Gastrointestinal:  Negative for abdominal pain.  Musculoskeletal:  Positive for back pain.  Neurological:  Positive for weakness.   All other systems reviewed and are negative.   Physical Exam Updated Vital Signs BP (!) 145/88 (BP Location: Right Arm)   Pulse 90   Temp 98.8 F (37.1 C) (Oral)   Resp 18   Ht 6\' 3"  (1.905 m)   Wt 90.3 kg   SpO2 95%   BMI 24.88 kg/m  Physical Exam Vitals and nursing note reviewed.  Constitutional:      Appearance: He is well-developed.  HENT:     Head: Normocephalic.  Eyes:     Extraocular Movements: Extraocular movements intact.     Pupils: Pupils are equal, round, and reactive to light.  Cardiovascular:     Rate and Rhythm: Normal rate.  Pulmonary:     Effort: Pulmonary effort is normal.  Abdominal:     General: There is no distension.  Musculoskeletal:        General: Normal range of motion.     Cervical back: Normal range of motion.  Skin:    General: Skin is warm.  Neurological:     General: No focal deficit present.     Mental Status: He is alert and oriented to person, place, and time.  Psychiatric:        Mood and Affect: Mood normal.     ED Results / Procedures / Treatments   Labs (all  labs ordered are listed, but only abnormal results are displayed) Labs Reviewed  URINALYSIS, ROUTINE W REFLEX MICROSCOPIC - Abnormal; Notable for the following components:      Result Value   APPearance HAZY (*)    Protein, ur TRACE (*)    Nitrite POSITIVE (*)    Leukocytes,Ua LARGE (*)    Bacteria, UA RARE (*)    All other components within normal limits  COMPREHENSIVE METABOLIC PANEL - Abnormal; Notable for the following components:   Sodium 132 (*)    Chloride 94 (*)    Glucose, Bld 103 (*)    Calcium 8.8 (*)    All other components within normal limits  URINE CULTURE  CBC WITH DIFFERENTIAL/PLATELET    EKG EKG Interpretation  Date/Time:  Monday April 21 2022 19:39:19 EDT Ventricular Rate:  102 PR Interval:    QRS Duration: 94 QT Interval:  339 QTC Calculation: 442 R Axis:   64 Text Interpretation: Normal sinus rhythm RSR' in V1 or V2, probably  normal variant No significant change was found Confirmed by Glynn Octave [id number]) on 04/21/2022 8:03:18 PM  Radiology DG Chest 2 View  Result Date: 04/21/2022 CLINICAL DATA:  Hypoxia EXAM: CHEST - 2 VIEW COMPARISON:  12/24/2021 FINDINGS: Lungs are clear. No pneumothorax or pleural effusion. Mild cardiomegaly is stable. Pulmonary vascularity is normal. No acute bone abnormality. IMPRESSION: Stable mild cardiomegaly. Electronically Signed   By: Helyn Numbers M.D.   On: 04/21/2022 19:38   CT Cervical Spine Wo Contrast  Result Date: 04/21/2022 CLINICAL DATA:  Larey Seat twice over the last day.  Back pain. EXAM: CT CERVICAL SPINE WITHOUT CONTRAST TECHNIQUE: Multidetector CT imaging of the cervical spine was performed without intravenous contrast. Multiplanar CT image reconstructions were also generated. RADIATION DOSE REDUCTION: This exam was performed according to the departmental dose-optimization program which includes automated exposure control, adjustment of the mA and/or kV according to patient size and/or use of iterative reconstruction technique. COMPARISON:  09/25/2021 FINDINGS: Alignment: Straightening and kyphotic curvature in the cervical region. No acute traumatic malalignment. Skull base and vertebrae: No regional fracture. Soft tissues and spinal canal: No traumatic soft tissue finding. Disc levels: Degenerative spondylosis and facet arthropathy throughout the cervical region. Spinal stenosis that could be significant from C2-3 through C6-7. Severe bony foraminal stenosis throughout the region. Upper chest: Mild apical scarring. Other: None IMPRESSION: No acute traumatic finding. Advanced chronic degenerative disease throughout the cervical region. Spinal stenosis from C2-3 through C6-7 that could be significant. Bony foraminal stenosis throughout the region that could cause radicular nerve compression. Electronically Signed   By: Paulina Fusi M.D.   On: 04/21/2022 19:37   CT Lumbar Spine Wo  Contrast  Result Date: 04/21/2022 CLINICAL DATA:  Larey Seat twice over the last 2 days.  Back pain. EXAM: CT LUMBAR SPINE WITHOUT CONTRAST TECHNIQUE: Multidetector CT imaging of the lumbar spine was performed without intravenous contrast administration. Multiplanar CT image reconstructions were also generated. RADIATION DOSE REDUCTION: This exam was performed according to the departmental dose-optimization program which includes automated exposure control, adjustment of the mA and/or kV according to patient size and/or use of iterative reconstruction technique. COMPARISON:  Radiography 05/03/2011 FINDINGS: Segmentation: 5 lumbar type vertebral bodies. Alignment: Straightening of the normal lumbar lordosis. Minimal scoliotic curvature convex to the left. Vertebrae: No acute fracture in the region from T11 through the sacrum. Chronic fusion of the vertebral bodies at L2 and L3. Probable solid bridging osteophytes from T11 through L1 and at L3-4. Paraspinal and other soft  tissues: Negative Disc levels: T10-11: Disc degeneration and vacuum phenomenon. No compressive stenosis. T11 through L1: Solid bridging osteophytes.  No stenosis. L1-2: Mobile level with degenerative disc disease. Mild bulging of the disc. No compressive stenosis. L2-3: Chronic fusion. Mild chronic narrowing of the lateral recesses and foramina, not likely compressive. L3-4: Solid bridging anterior osteophytes. Bulging of the disc. Stenosis of both lateral recesses. L4-5: Mobile level. Disc degeneration with vacuum phenomenon. Facet and ligamentous hypertrophy. Severe multifactorial stenosis that could be symptomatic. L5-S1: Mobile level. Disc degeneration with vacuum phenomenon. Facet and ligamentous hypertrophy. Severe multifactorial stenosis that could be symptomatic. IMPRESSION: No acute or traumatic finding. Severe multifactorial spinal stenosis at L4-5 and L5-S1 that could be symptomatic. Electronically Signed   By: Paulina Fusi M.D.   On:  04/21/2022 19:35   CT Head Wo Contrast  Result Date: 04/21/2022 CLINICAL DATA:  Larey Seat twice in the last day. EXAM: CT HEAD WITHOUT CONTRAST TECHNIQUE: Contiguous axial images were obtained from the base of the skull through the vertex without intravenous contrast. RADIATION DOSE REDUCTION: This exam was performed according to the departmental dose-optimization program which includes automated exposure control, adjustment of the mA and/or kV according to patient size and/or use of iterative reconstruction technique. COMPARISON:  09/25/2021 FINDINGS: Brain: Age related volume loss. Mild chronic small-vessel ischemic change of the hemispheric white matter. No sign of acute infarction, mass lesion, hemorrhage, hydrocephalus or extra-axial collection. Vascular: There is atherosclerotic calcification of the major vessels at the base of the brain. Skull: Negative Sinuses/Orbits: Clear/normal Other: None IMPRESSION: No acute or traumatic finding. Age related volume loss. Mild chronic small-vessel ischemic changes of the white matter. Electronically Signed   By: Paulina Fusi M.D.   On: 04/21/2022 19:27    Procedures Procedures    Medications Ordered in ED Medications  cefTRIAXone (ROCEPHIN) 1 g in sodium chloride 0.9 % 100 mL IVPB (1 g Intravenous New Bag/Given 04/21/22 2157)    ED Course/ Medical Decision Making/ A&P                           Medical Decision Making Pt complains of pain in his back after falling.  Pt did hit his head.  No loss of conciousness   Amount and/or Complexity of Data Reviewed Independent Historian:     Details: Pt here with son and daughter in law.  Pt lives at [facility]. living Labs: ordered. Radiology: ordered and independent interpretation performed. Decision-making details documented in ED Course. ECG/medicine tests: ordered.    Details: Ct c spine, Ls spine and head  no acute injury  Risk Prescription drug management. Risk Details: Dr. Manus Gunning in to see and  examine.  Ua shows large leukocytes.  Pt given rx for keflex.  Pt's family reports they feel like pt is safe at the facility where he lives            Final Clinical Impression(s) / ED Diagnoses Final diagnoses:  Urinary tract infection without hematuria, site unspecified  Fall, initial encounter  Contusion of head, unspecified part of head, initial encounter    Rx / DC Orders ED Discharge Orders          Ordered    cephALEXin (KEFLEX) 500 MG capsule  4 times daily        04/21/22 2326           An After Visit Summary was printed and given to the patient.    Cheron Schaumann  K, PA-C 04/23/22 0009    Glynn Octave, MD 04/23/22 1120

## 2022-04-23 DIAGNOSIS — M6281 Muscle weakness (generalized): Secondary | ICD-10-CM | POA: Diagnosis not present

## 2022-04-23 DIAGNOSIS — Z9181 History of falling: Secondary | ICD-10-CM | POA: Diagnosis not present

## 2022-04-24 DIAGNOSIS — M6281 Muscle weakness (generalized): Secondary | ICD-10-CM | POA: Diagnosis not present

## 2022-04-24 DIAGNOSIS — Z9181 History of falling: Secondary | ICD-10-CM | POA: Diagnosis not present

## 2022-04-24 LAB — URINE CULTURE: Culture: >=100000 — AB

## 2022-04-25 ENCOUNTER — Telehealth: Payer: Self-pay

## 2022-04-25 DIAGNOSIS — S72002D Fracture of unspecified part of neck of left femur, subsequent encounter for closed fracture with routine healing: Secondary | ICD-10-CM | POA: Diagnosis not present

## 2022-04-25 DIAGNOSIS — R262 Difficulty in walking, not elsewhere classified: Secondary | ICD-10-CM | POA: Diagnosis not present

## 2022-04-25 DIAGNOSIS — Z96642 Presence of left artificial hip joint: Secondary | ICD-10-CM | POA: Diagnosis not present

## 2022-04-25 DIAGNOSIS — M6281 Muscle weakness (generalized): Secondary | ICD-10-CM | POA: Diagnosis not present

## 2022-04-25 DIAGNOSIS — Z9181 History of falling: Secondary | ICD-10-CM | POA: Diagnosis not present

## 2022-04-25 NOTE — Telephone Encounter (Signed)
Post ED Visit - Positive Culture Follow-up  Culture report reviewed by antimicrobial stewardship pharmacist: [facility] [x]  [place]on Amend, .D. []  [place], .D., BCPS AQ-ID []  Celedonio Miyamoto, Pharm.D., BCPS []  [address], Pharm.D., BCPS []  [place], Garvin Fila.D., BCPS, AAHIVP []  , Pharm.D., BCPS, AAHIVP []  Georgina Pillion, PharmD, BCPS []  , PharmD, BCPS []  [place], PharmD, BCPS []  [address], PharmD []  , PharmD, BCPS []  Estella Husk, PharmD  Pharmacy Team []  Lysle Pearl, PharmD []  , PharmD []  Phillips Climes, PharmD []  , Rph []  Agapito Games) , PharmD []  Verlan Friends, PharmD []  , PharmD []  Mervyn Gay, PharmD []  , PharmD []  Vinnie Level, PharmD []  [facility], PharmD []  , PharmD []  Len Childs, PharmD   Positive urine culture Treated with Cephalexin, organism sensitive to the same and no further patient follow-up is required at this time.  04/25/2022, 12:13 PM

## 2022-04-29 DIAGNOSIS — Z96642 Presence of left artificial hip joint: Secondary | ICD-10-CM | POA: Diagnosis not present

## 2022-04-29 DIAGNOSIS — Z9181 History of falling: Secondary | ICD-10-CM | POA: Diagnosis not present

## 2022-04-29 DIAGNOSIS — M6281 Muscle weakness (generalized): Secondary | ICD-10-CM | POA: Diagnosis not present

## 2022-04-29 DIAGNOSIS — R262 Difficulty in walking, not elsewhere classified: Secondary | ICD-10-CM | POA: Diagnosis not present

## 2022-04-29 DIAGNOSIS — S72002D Fracture of unspecified part of neck of left femur, subsequent encounter for closed fracture with routine healing: Secondary | ICD-10-CM | POA: Diagnosis not present

## 2022-04-30 ENCOUNTER — Encounter (HOSPITAL_BASED_OUTPATIENT_CLINIC_OR_DEPARTMENT_OTHER): Payer: Medicare PPO | Admitting: Physician Assistant

## 2022-04-30 DIAGNOSIS — L89623 Pressure ulcer of left heel, stage 3: Secondary | ICD-10-CM | POA: Diagnosis not present

## 2022-04-30 DIAGNOSIS — E440 Moderate protein-calorie malnutrition: Secondary | ICD-10-CM | POA: Diagnosis not present

## 2022-04-30 DIAGNOSIS — Z96642 Presence of left artificial hip joint: Secondary | ICD-10-CM | POA: Diagnosis not present

## 2022-04-30 DIAGNOSIS — I110 Hypertensive heart disease with heart failure: Secondary | ICD-10-CM | POA: Diagnosis not present

## 2022-04-30 DIAGNOSIS — I5042 Chronic combined systolic (congestive) and diastolic (congestive) heart failure: Secondary | ICD-10-CM | POA: Diagnosis not present

## 2022-04-30 DIAGNOSIS — M6281 Muscle weakness (generalized): Secondary | ICD-10-CM | POA: Diagnosis not present

## 2022-04-30 DIAGNOSIS — J449 Chronic obstructive pulmonary disease, unspecified: Secondary | ICD-10-CM | POA: Diagnosis not present

## 2022-04-30 DIAGNOSIS — Z9181 History of falling: Secondary | ICD-10-CM | POA: Diagnosis not present

## 2022-04-30 DIAGNOSIS — Z6824 Body mass index (BMI) 24.0-24.9, adult: Secondary | ICD-10-CM | POA: Diagnosis not present

## 2022-04-30 DIAGNOSIS — I2510 Atherosclerotic heart disease of native coronary artery without angina pectoris: Secondary | ICD-10-CM | POA: Diagnosis not present

## 2022-05-01 DIAGNOSIS — Z96642 Presence of left artificial hip joint: Secondary | ICD-10-CM | POA: Diagnosis not present

## 2022-05-01 DIAGNOSIS — S72002D Fracture of unspecified part of neck of left femur, subsequent encounter for closed fracture with routine healing: Secondary | ICD-10-CM | POA: Diagnosis not present

## 2022-05-01 DIAGNOSIS — M6281 Muscle weakness (generalized): Secondary | ICD-10-CM | POA: Diagnosis not present

## 2022-05-01 DIAGNOSIS — R262 Difficulty in walking, not elsewhere classified: Secondary | ICD-10-CM | POA: Diagnosis not present

## 2022-05-01 DIAGNOSIS — Z9181 History of falling: Secondary | ICD-10-CM | POA: Diagnosis not present

## 2022-05-01 NOTE — Progress Notes (Signed)
ARAFAT, COCUZZA ([id number]) Visit Report for 04/30/2022 Arrival Information Details Patient Name: Date of Service: Hector Beltran 04/30/2022 2:45 PM Medical Record Number: [id number] Patient Account Number: [id number] Date of Birth/Sex: Treating RN: 07-31-1928 (88 y.o. [name] Primary Care Joneric Streight: Seward Carol Other Clinician: Referring Krystale Rinkenberger: Treating Itamar Mcgowan/Extender: Jennye Moccasin in Treatment: 9 Visit Information History Since Last Visit Added or deleted any medications: No Patient Arrived: Wheel Chair Any new allergies or adverse reactions: No Arrival Time: 15:02 Had a fall or experienced change in No Accompanied By: Son, Daughter in [place] activities of daily living that may affect Transfer Assistance: Manual risk of falls: Patient Identification Verified: Yes Signs or symptoms of abuse/neglect since last visito No Secondary Verification Process Completed: Yes Hospitalized since last visit: No Patient Requires Transmission-Based Precautions: No Implantable device outside of the clinic excluding No Patient Has Alerts: No cellular tissue based products placed in the center since last visit: Has Dressing in Place as Prescribed: Yes Pain Present Now: No Electronic Signature(s) Signed: 04/30/2022 5:13:36 PM By: Lorrin Jackson Entered By: Lorrin Jackson on 04/30/2022 15:03:10 -------------------------------------------------------------------------------- Encounter Discharge Information Details Patient Name: Date of Service: Hector Beltran, [name] 04/30/2022 2:45 PM Medical Record Number: [id number] Patient Account Number: [id number] Date of Birth/Sex: Treating RN: 01/13/1928 (88 y.o. [name] Primary Care Brinklee Cisse: Seward Carol Other Clinician: Referring Benelli Winther: Treating Antoinette Borgwardt/Extender: Jennye Moccasin in Treatment: 9 Encounter Discharge Information Items Post Procedure Vitals Discharge Condition:  Stable Temperature (F): 97.8 Ambulatory Status: Wheelchair Pulse (bpm): 87 Discharge Destination: Home Respiratory Rate (breaths/min): 20 Transportation: Private Auto Blood Pressure (mmHg): 139/81 Accompanied By: Pandora Leiter, Daughter In Law Schedule Follow-up Appointment: Yes Clinical Summary of Care: Provided on 04/30/2022 Form Type Recipient Paper Patient Patient Electronic Signature(s) Signed: 04/30/2022 5:13:36 PM By: Lorrin Jackson Entered By: Lorrin Jackson on 04/30/2022 15:40:43 -------------------------------------------------------------------------------- Lower Extremity Assessment Details Patient Name: Date of Service: Hector Beltran, [name] 04/30/2022 2:45 PM Medical Record Number: [id number] Patient Account Number: [id number] Date of Birth/Sex: Treating RN: Jun 03, 1928 (88 y.o. [name] Primary Care Esmay Amspacher: Seward Carol Other Clinician: Referring Lalitha Ilyas: Treating Jaklyn Alen/Extender: Jennye Moccasin in Treatment: 9 Edema Assessment Assessed: Shirlyn Goltz: Yes] Patrice Paradise: No] Edema: [Left: N] [Right: o] Calf Left: Right: Point of Measurement: 33 cm From Medial Instep 38 cm Ankle Left: Right: Point of Measurement: 10 cm From Medial Instep 25 cm Vascular Assessment Pulses: Dorsalis Pedis Palpable: [Left:Yes] Electronic Signature(s) Signed: 04/30/2022 5:13:36 PM By: Lorrin Jackson Entered By: Lorrin Jackson on 04/30/2022 15:09:19 -------------------------------------------------------------------------------- Multi-Disciplinary Care Plan Details Patient Name: Date of Service: Hector Beltran, [name] 04/30/2022 2:45 PM Medical Record Number: [id number] Patient Account Number: [id number] Date of Birth/Sex: Treating RN: 14-Oct-1927 (88 y.o. [name] Primary Care Danuta Huseman: Seward Carol Other Clinician: Referring Genieve Ramaswamy: Treating Larren Copes/Extender: Jennye Moccasin in Treatment: 9 Active Inactive Wound/Skin  Impairment Nursing Diagnoses: Impaired tissue integrity Goals: Patient/caregiver will verbalize understanding of skin care regimen Date Initiated: 02/26/2022 Target Resolution Date: 06/04/2022 Goal Status: Active Ulcer/skin breakdown will have a volume reduction of 30% by week 4 Date Initiated: 02/26/2022 Date Inactivated: 04/30/2022 Target Resolution Date: 04/30/2022 Goal Status: Met Ulcer/skin breakdown will have a volume reduction of 50% by week 8 Date Initiated: 04/30/2022 Target Resolution Date: 06/04/2022 Goal Status: Active Interventions: Assess patient/caregiver ability to obtain necessary supplies Assess patient/caregiver ability to perform ulcer/skin care regimen upon admission and as needed Assess ulceration(s) every visit  Provide education on ulcer and skin care Treatment Activities: Topical wound management initiated : 02/26/2022 Notes: 04/02/22: Wound care regimen continues. Electronic Signature(s) Signed: 04/30/2022 5:13:36 PM By: Lorrin Jackson Entered By: Lorrin Jackson on 04/30/2022 15:02:36 -------------------------------------------------------------------------------- Pain Assessment Details Patient Name: Date of Service: Hector Beltran, [name] 04/30/2022 2:45 PM Medical Record Number: [id number] Patient Account Number: [id number] Date of Birth/Sex: Treating RN: 11-18-1927 (88 y.o. [name] Primary Care Montrail Mehrer: Seward Carol Other Clinician: Referring Edson Deridder: Treating Levante Simones/Extender: Jennye Moccasin in Treatment: 9 Active Problems Location of Pain Severity and Description of Pain Patient Has Paino No Site Locations Pain Management and Medication Current Pain Management: Electronic Signature(s) Signed: 04/30/2022 5:13:36 PM By: Lorrin Jackson Entered By: Lorrin Jackson on 04/30/2022 15:03:21 -------------------------------------------------------------------------------- Patient/Caregiver Education Details Patient  Name: Date of Service: Hector Beltran 8/16/2023andnbsp2:45 PM Medical Record Number: [id number] Patient Account Number: [id number] Date of Birth/Gender: Treating RN: [birth year] (88 y.o. [name] Primary Care Physician: Seward Carol Other Clinician: Referring Physician: Treating Physician/Extender: Jennye Moccasin in Treatment: 9 Education Assessment Education Provided To: Patient Education Topics Provided Offloading: Methods: Explain/Verbal, Printed Responses: State content correctly Wound/Skin Impairment: Methods: Explain/Verbal, Printed Responses: State content correctly Electronic Signature(s) Signed: 04/30/2022 5:13:36 PM By: Lorrin Jackson Entered By: Lorrin Jackson on 04/30/2022 15:01:50 -------------------------------------------------------------------------------- Wound Assessment Details Patient Name: Date of Service: Hector Beltran, [name]. 04/30/2022 2:45 PM Medical Record Number: [id number] Patient Account Number: [id number] Date of Birth/Sex: Treating RN: 05-28-1928 (88 y.o. [name] Primary Care Jodean Valade: Seward Carol Other Clinician: Referring Shakeera Rightmyer: Treating Cristalle Rohm/Extender: Jennye Moccasin in Treatment: 9 Wound Status Wound Number: 1 Primary Pressure Ulcer Etiology: Wound Location: Left Calcaneus Wound Open Wounding Event: Pressure Injury Status: Date Acquired: 01/08/2022 Comorbid Chronic Obstructive Pulmonary Disease (COPD), Congestive Weeks Of Treatment: 9 History: Heart Failure, Coronary Artery Disease, Hypertension, Cirrhosis , Clustered Wound: No Neuropathy Photos Wound Measurements Length: (cm) 0.5 Width: (cm) 0.5 Depth: (cm) 0.1 Area: (cm) 0.196 Volume: (cm) 0.02 % Reduction in Area: 97.2% % Reduction in Volume: 98.6% Epithelialization: Small (1-33%) Tunneling: No Undermining: No Wound Description Classification: Category/Stage III Wound Margin: Distinct, outline  attached Exudate Amount: Medium Exudate Type: Serosanguineous Exudate Color: red, brown Foul Odor After Cleansing: No Slough/Fibrino Yes Wound Bed Granulation Amount: Large (67-100%) Exposed Structure Granulation Quality: Red, Pink Fascia Exposed: No Necrotic Amount: Small (1-33%) Fat Layer (Subcutaneous Tissue) Exposed: Yes Tendon Exposed: No Muscle Exposed: No Joint Exposed: No Bone Exposed: No Treatment Notes Wound #1 (Calcaneus) Wound Laterality: Left Cleanser Soap and Water Discharge Instruction: Wash with Dial Antibacterial Soap Peri-Wound Care Topical Primary Dressing Hydrofera Blue Ready Foam, 4x5 in Discharge Instruction: Apply to wound bed as instructed Secondary Dressing Woven Gauze Sponge, Non-Sterile 4x4 in Discharge Instruction: Apply over primary dressing as directed. Secured With [employer], 4.5x3.1 (in/yd) Discharge Instruction: Secure with Kerlix as directed. 6M Medipore H Soft Cloth Surgical T ape, 4 x 10 (in/yd) Discharge Instruction: Secure with tape as directed. Compression Wrap Compression Stockings Add-Ons Electronic Signature(s) Signed: 04/30/2022 5:13:36 PM By: Lorrin Jackson Entered By: Lorrin Jackson on 04/30/2022 15:27:21 -------------------------------------------------------------------------------- Vitals Details Patient Name: Date of Service: Hector Beltran, [name] 04/30/2022 2:45 PM Medical Record Number: [id number] Patient Account Number: [id number] Date of Birth/Sex: Treating RN: 1928/03/12 (88 y.o. [name] Primary Care Joziah Dollins: Seward Carol Other Clinician: Referring Pegi Milazzo: Treating Bryonna Sundby/Extender: Jennye Moccasin in Treatment: 9 Vital Signs Time Taken:  15:08 Temperature (F): 97.8 Height (in): 75 Pulse (bpm): 87 Weight (lbs): 192 Respiratory Rate (breaths/min): 20 Body Mass Index (BMI): 24 Blood Pressure (mmHg): 139/81 Reference Range: 80 - 120 mg / dl Airway Pulse Oximetry  (%): 91 Electronic Signature(s) Signed: 04/30/2022 5:13:36 PM By: Lorrin Jackson Entered By: Lorrin Jackson on 04/30/2022 15:08:21

## 2022-05-01 NOTE — Progress Notes (Addendum)
TORREY, BALLINAS ([id number]) Visit Report for 04/30/2022 Chief Complaint Document Details Patient Name: Date of Service: Hector Beltran 04/30/2022 2:45 PM Medical Record Number: [id number] Patient Account Number: [id number] Date of Birth/Sex: Treating RN: [birth year] (88 y.o. Lytle Michaels Primary Care Provider: Renford Dills Other Clinician: Referring Provider: Treating Provider/Extender: Prudencio Burly in Treatment: 9 Information Obtained from: Patient Chief Complaint Pressure ulcer left heel Electronic Signature(s) Signed: 04/30/2022 3:15:22 PM By: Lenda Kelp PA-C Entered By: Lenda Kelp on 04/30/2022 15:15:22 -------------------------------------------------------------------------------- Debridement Details Patient Name: Date of Service: Hector Beltran, Hector RRY N. 04/30/2022 2:45 PM Medical Record Number: [id number] Patient Account Number: [id number] Date of Birth/Sex: Treating RN: 09/24/1927 (88 y.o. Lytle Michaels Primary Care Provider: Renford Dills Other Clinician: Referring Provider: Treating Provider/Extender: Prudencio Burly in Treatment: 9 Debridement Performed for Assessment: Wound #1 Left Calcaneus Performed By: Physician Lenda Kelp, PA Debridement Type: Debridement Level of Consciousness (Pre-procedure): Awake and Alert Pre-procedure Verification/Time Out Yes - 15:19 Taken: Start Time: 15:20 Pain Control: Lidocaine 5% topical ointment T Area Debrided (L x W): otal 0.5 (cm) x 0.5 (cm) = 0.25 (cm) Tissue and other material debrided: Non-Viable, Callus, Slough, Subcutaneous, Slough Level: Skin/Subcutaneous Tissue Debridement Description: Excisional Instrument: Curette Bleeding: Minimum Hemostasis Achieved: Pressure End Time: 15:25 Response to Treatment: Procedure was tolerated well Level of Consciousness (Post- Awake and Alert procedure): Post Debridement Measurements of Total Wound Length: (cm)  0.5 Stage: Category/Stage III Width: (cm) 0.5 Depth: (cm) 0.1 Volume: (cm) 0.02 Character of Wound/Ulcer Post Debridement: Stable Post Procedure Diagnosis Same as Pre-procedure Electronic Signature(s) Signed: 04/30/2022 4:35:53 PM By: Lenda Kelp PA-C Signed: 04/30/2022 5:13:36 PM By: Antonieta Iba Entered By: Antonieta Iba on 04/30/2022 15:27:01 -------------------------------------------------------------------------------- HPI Details Patient Name: Date of Service: Hector Beltran, Hector RRY N. 04/30/2022 2:45 PM Medical Record Number: [id number] Patient Account Number: [id number] Date of Birth/Sex: Treating RN: [birth year] (88 y.o. Lytle Michaels Primary Care Provider: Renford Dills Other Clinician: Referring Provider: Treating Provider/Extender: Prudencio Burly in Treatment: 9 History of Present Illness HPI Description: 02-26-2022 upon evaluation today patient presents for initial inspection here in our clinic concerning issues that he has been having with his left heel he has a pressure ulcer that ensued following having fallen and fractured his hip. This actually occurred in April and he had surgery for total hip replacement on the left of December 24, 2021. He was discharged to a SNF at the point of leaving the hospital and it sometime during the hospital stay and SNF stay he developed a pressure ulcer to his left heel. At this point it does not seem to be getting any worse he is at [facility] independent living and they need orders to know what to do. Right now they do think that they can do the dressing changes but he needs to have the supplies and the recommendations on hand. He did have an x-ray performed on 02-13-2022 which was negative for osteomyelitis this is good news right now they just been using a dry dressing and that is good. Patient does have a history of a left hip replacement total, hypertension, coronary artery disease, COPD, congestive heart  failure, and protein calorie malnutrition as noted by low serum albumin and protein. 03-05-2022 upon evaluation today patient appears to be doing well currently in regard to his heel. Unfortunately the dressing that we put on is the one that came back in with.  The facility which was supposed to be changing this for him we got all the supplies for them but they never actually changed the dressing. With that being said this obviously is frustrating for the family who states they are definitely can reach out to the facility regard. They will get that straightened out. 03-12-2022 upon evaluation today patient appears to be doing well with regard to his wound although he still is not getting this changed through the week. He is pretty much staying off only put on 20 gets back. Obviously this is not the way we wanted to proceed with this but nonetheless we had a hard time getting home health out to do what they needed to do. We are going to follow-up with a phone call and try to see what the deal is as far as them receiving our orders. We faxed them but unfortunately they do not seem to get them they say to see if there is a different fax number. 03-19-2022 upon evaluation today patient appears to be doing well currently in regard to his wound. In fact this has some slough buildup but seems to be doing quite well. I do not see any evidence of active infection at this time which is great news. No fevers, chills, nausea, vomiting, or diarrhea. 03-26-2022 upon evaluation today patient's wound is showing signs of improvement. Fortunately there does not appear to be any evidence of active infection locally or systemically at this time which is great news. No fevers, chills, nausea, vomiting, or diarrhea. 04-02-2022 upon evaluation today patient appears to be doing well currently in regard to his wound. He is actually showing signs of improvement which is great news. Fortunately I do not see any evidence of active  infection locally or systemically. There was a lot of dry skin around the edges of the wound once I remove this and actually clean the surface of the wound this is measuring significantly smaller which is great news. I am very pleased in that regard. 04-16-2022 upon evaluation today patient appears to be doing well currently in regard to his wound. He has been tolerating the dressing changes there is been some need for sharp debridement today. Fortunately I do not see any evidence of active infection locally or systemically at this time. 04-30-2022 upon evaluation today patient appears to be doing well with regard to his wound this is actually significantly smaller. The good news is I do believe we are making excellent progress here and I think the wound is looking so much better. Electronic Signature(s) Signed: 04/30/2022 3:30:35 PM By: Lenda Kelp PA-C Entered By: Lenda Kelp on 04/30/2022 15:30:35 -------------------------------------------------------------------------------- Physical Exam Details Patient Name: Date of Service: Hector Beltran 04/30/2022 2:45 PM Medical Record Number: [id number] Patient Account Number: [id number] Date of Birth/Sex: Treating RN: 25-Oct-1927 (88 y.o. Lytle Michaels Primary Care Provider: Renford Dills Other Clinician: Referring Provider: Treating Provider/Extender: Prudencio Burly in Treatment: 9 Constitutional Well-nourished and well-hydrated in no acute distress. Respiratory normal breathing without difficulty. Psychiatric this patient is able to make decisions and demonstrates good insight into disease process. Alert and Oriented x 3. pleasant and cooperative. Notes Upon inspection patient's wound bed actually showed signs of good granulation and epithelization at this point. Fortunately I do not see any evidence of infection locally or systemically and this is great news. Electronic Signature(s) Signed: 04/30/2022  3:30:51 PM By: Lenda Kelp PA-C Entered By: Lenda Kelp on 04/30/2022 15:30:51 --------------------------------------------------------------------------------  Physician Orders Details Patient Name: Date of Service: Hector Beltran 04/30/2022 2:45 PM Medical Record Number: [id number] Patient Account Number: [id number] Date of Birth/Sex: Treating RN: 21-Jan-1928 (88 y.o. [name] Primary Care Provider: Seward Carol Other Clinician: Referring Provider: Treating Provider/Extender: Jennye Moccasin in Treatment: 9 Verbal / Phone Orders: No Diagnosis Coding ICD-10 Coding Code Description [phone] Pressure ulcer of left heel, stage 3 Z96.642 Presence of left artificial hip joint I10 Essential (primary) hypertension I25.10 Atherosclerotic heart disease of native coronary artery without angina pectoris J44.9 Chronic obstructive pulmonary disease, unspecified I50.42 Chronic combined systolic (congestive) and diastolic (congestive) heart failure E44.0 Moderate protein-calorie malnutrition Follow-up Appointments ppointment in 2 weeks. - 05/14/22 @ 2:45pm with Orland Jarred, RN (Room 7) Return A Bathing/ Shower/ Hygiene May shower with protection but do not get wound dressing(s) wet. - May use cast protector bag from [employer], [place] or [facility] on days dressing not being changed. May shower and wash wound with soap and water. - May wash with antibacterial soap when changing dressing. Edema Control - Lymphedema / SCD / Other Elevate legs to the level of the heart or above for 30 minutes daily and/or when sitting, a frequency of: - throughout the day Avoid standing for long periods of time. Off-Loading Other: - Avoid/Limit pressure to heel. Recommend wearing a sandal that does not rub heel area. Additional Orders / Instructions Follow Nutritious Diet - High Protein Diet: 100g of Protein daily Other: - May participate in PT if has appropriate footwear. Non  Wound Condition pply the following to affected area as directed: - Apply Antifungal/Athletes Foot cream to right foot. A Wound Treatment Wound #1 - Calcaneus Wound Laterality: Left Cleanser: Soap and Water (Home Health) 3 x Per Week/30 Days Discharge Instructions: Wash with Dial Antibacterial Soap Prim Dressing: Hydrofera Blue Ready Foam, 4x5 in (Home Health) (Generic) 3 x Per Week/30 Days ary Discharge Instructions: Apply to wound bed as instructed Secondary Dressing: Woven Gauze Sponge, Non-Sterile 4x4 in (Home Health) (Generic) 3 x Per Week/30 Days Discharge Instructions: Apply over primary dressing as directed. Secured With: [employer], 4.5x3.1 (in/yd) (Home Health) (Generic) 3 x Per Week/30 Days Discharge Instructions: Secure with Kerlix as directed. Secured With: 41M Medipore H Soft Cloth Surgical T ape, 4 x 10 (in/yd) (Home Health) (Generic) 3 x Per Week/30 Days Discharge Instructions: Secure with tape as directed. Electronic Signature(s) Signed: 04/30/2022 4:35:53 PM By: Worthy Keeler PA-C Signed: 04/30/2022 5:13:36 PM By: Lorrin Jackson Entered By: Lorrin Jackson on 04/30/2022 15:30:17 -------------------------------------------------------------------------------- Problem List Details Patient Name: Date of Service: Hector Beltran, Hector RRY N. 04/30/2022 2:45 PM Medical Record Number: [id number] Patient Account Number: [id number] Date of Birth/Sex: Treating RN: 1928-03-20 (88 y.o. [name] Primary Care Provider: Seward Carol Other Clinician: Referring Provider: Treating Provider/Extender: Jennye Moccasin in Treatment: 9 Active Problems ICD-10 Encounter Code Description Active Date MDM Diagnosis [phone] Pressure ulcer of left heel, stage 3 02/26/2022 No Yes Z96.642 Presence of left artificial hip joint 02/26/2022 No Yes I10 Essential (primary) hypertension 02/26/2022 No Yes I25.10 Atherosclerotic heart disease of native coronary artery  without angina pectoris 02/26/2022 No Yes J44.9 Chronic obstructive pulmonary disease, unspecified 02/26/2022 No Yes I50.42 Chronic combined systolic (congestive) and diastolic (congestive) heart failure 02/26/2022 No Yes E44.0 Moderate protein-calorie malnutrition 02/26/2022 No Yes Inactive Problems Resolved Problems Electronic Signature(s) Signed: 04/30/2022 3:15:16 PM By: Worthy Keeler PA-C Entered By: Worthy Keeler on 04/30/2022 15:15:16 --------------------------------------------------------------------------------  Progress Note Details Patient Name: Date of Service: Hector Beltran 04/30/2022 2:45 PM Medical Record Number: [id number] Patient Account Number: [id number] Date of Birth/Sex: Treating RN: 01-02-1928 (88 y.o. [name] Primary Care Provider: Seward Carol Other Clinician: Referring Provider: Treating Provider/Extender: Jennye Moccasin in Treatment: 9 Subjective Chief Complaint Information obtained from Patient Pressure ulcer left heel History of Present Illness (HPI) 02-26-2022 upon evaluation today patient presents for initial inspection here in our clinic concerning issues that he has been having with his left heel he has a pressure ulcer that ensued following having fallen and fractured his hip. This actually occurred in April and he had surgery for total hip replacement on the left of December 24, 2021. He was discharged to a SNF at the point of leaving the hospital and it sometime during the hospital stay and SNF stay he developed a pressure ulcer to his left heel. At this point it does not seem to be getting any worse he is at [facility] independent living and they need orders to know what to do. Right now they do think that they can do the dressing changes but he needs to have the supplies and the recommendations on hand. He did have an x-ray performed on 02-13-2022 which was negative for osteomyelitis this is good news right now they just  been using a dry dressing and that is good. Patient does have a history of a left hip replacement total, hypertension, coronary artery disease, COPD, congestive heart failure, and protein calorie malnutrition as noted by low serum albumin and protein. 03-05-2022 upon evaluation today patient appears to be doing well currently in regard to his heel. Unfortunately the dressing that we put on is the one that came back in with. The facility which was supposed to be changing this for him we got all the supplies for them but they never actually changed the dressing. With that being said this obviously is frustrating for the family who states they are definitely can reach out to the facility regard. They will get that straightened out. 03-12-2022 upon evaluation today patient appears to be doing well with regard to his wound although he still is not getting this changed through the week. He is pretty much staying off only put on 20 gets back. Obviously this is not the way we wanted to proceed with this but nonetheless we had a hard time getting home health out to do what they needed to do. We are going to follow-up with a phone call and try to see what the deal is as far as them receiving our orders. We faxed them but unfortunately they do not seem to get them they say to see if there is a different fax number. 03-19-2022 upon evaluation today patient appears to be doing well currently in regard to his wound. In fact this has some slough buildup but seems to be doing quite well. I do not see any evidence of active infection at this time which is great news. No fevers, chills, nausea, vomiting, or diarrhea. 03-26-2022 upon evaluation today patient's wound is showing signs of improvement. Fortunately there does not appear to be any evidence of active infection locally or systemically at this time which is great news. No fevers, chills, nausea, vomiting, or diarrhea. 04-02-2022 upon evaluation today patient appears to  be doing well currently in regard to his wound. He is actually showing signs of improvement which is great news. Fortunately I do not  see any evidence of active infection locally or systemically. There was a lot of dry skin around the edges of the wound once I remove this and actually clean the surface of the wound this is measuring significantly smaller which is great news. I am very pleased in that regard. 04-16-2022 upon evaluation today patient appears to be doing well currently in regard to his wound. He has been tolerating the dressing changes there is been some need for sharp debridement today. Fortunately I do not see any evidence of active infection locally or systemically at this time. 04-30-2022 upon evaluation today patient appears to be doing well with regard to his wound this is actually significantly smaller. The good news is I do believe we are making excellent progress here and I think the wound is looking so much better. Objective Constitutional Well-nourished and well-hydrated in no acute distress. Vitals Time Taken: 3:08 PM, Height: 75 in, Weight: 192 lbs, BMI: 24, Temperature: 97.8 F, Pulse: 87 bpm, Respiratory Rate: 20 breaths/min, Blood Pressure: 139/81 mmHg, Pulse Oximetry: 91 %. Respiratory normal breathing without difficulty. Psychiatric this patient is able to make decisions and demonstrates good insight into disease process. Alert and Oriented x 3. pleasant and cooperative. General Notes: Upon inspection patient's wound bed actually showed signs of good granulation and epithelization at this point. Fortunately I do not see any evidence of infection locally or systemically and this is great news. Integumentary (Hair, Skin) Wound #1 status is Open. Original cause of wound was Pressure Injury. The date acquired was: 01/08/2022. The wound has been in treatment 9 weeks. The wound is located on the Left Calcaneus. The wound measures 0.5cm length x 0.5cm width x 0.1cm depth;  0.196cm^2 area and 0.02cm^3 volume. There is Fat Layer (Subcutaneous Tissue) exposed. There is no tunneling or undermining noted. There is a medium amount of serosanguineous drainage noted. The wound margin is distinct with the outline attached to the wound base. There is large (67-100%) red, pink granulation within the wound bed. There is a small (1-33%) amount of necrotic tissue within the wound bed. 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 There was a Excisional Skin/Subcutaneous Tissue Debridement with a total area of 0.25 sq cm performed by Worthy Keeler, PA. With the following instrument(s): Curette to remove Non-Viable tissue/material. Material removed includes Callus, Subcutaneous Tissue, and Slough after achieving pain control using Lidocaine 5% topical ointment. No specimens were taken. A time out was conducted at 15:19, prior to the start of the procedure. A Minimum amount of bleeding was controlled with Pressure. The procedure was tolerated well. Post Debridement Measurements: 0.5cm length x 0.5cm width x 0.1cm depth; 0.02cm^3 volume. Post debridement Stage noted as Category/Stage III. Character of Wound/Ulcer Post Debridement is stable. Post procedure Diagnosis Wound #1: Same as Pre-Procedure Plan Follow-up Appointments: Return Appointment in 2 weeks. - 05/14/22 @ 2:45pm with Orland Jarred, RN (Room 7) Bathing/ Shower/ Hygiene: May shower with protection but do not get wound dressing(s) wet. - May use cast protector bag from  [employer], [place] or [facility] on days dressing not being changed. May shower and wash wound with soap and water. - May wash with  antibacterial soap when changing dressing. Edema Control - Lymphedema / SCD / Other: Elevate legs to the level of the heart or above for 30 minutes daily and/or when sitting, a frequency of: - throughout the day Avoid standing for long periods of time. Off-Loading: Other: - Avoid/Limit pressure to heel. Recommend wearing a sandal that does not rub heel area. Additional Orders / Instructions: Follow Nutritious Diet - High Protein Diet: 100g of Protein daily Other: - May participate in PT if has appropriate footwear. Non Wound Condition: Apply the following to affected area as directed: - Apply Antifungal/Athletes Foot cream to right foot. WOUND #1: - Calcaneus Wound Laterality: Left Cleanser: Soap and Water (Home Health) 3 x Per Week/30 Days Discharge Instructions: Wash with Dial Antibacterial Soap Prim Dressing: Hydrofera Blue Ready Foam, 4x5 in (Home Health) (Generic) 3 x Per Week/30 Days ary Discharge Instructions: Apply to wound bed as instructed Secondary Dressing: Woven Gauze Sponge, Non-Sterile 4x4 in (Home Health) (Generic) 3 x Per Week/30 Days Discharge Instructions: Apply over primary dressing as directed. Secured With: [employer], 4.5x3.1 (in/yd) (Home Health) (Generic) 3 x Per Week/30 Days Discharge Instructions: Secure with Kerlix as directed. Secured With: 24M Medipore H Soft Cloth Surgical T ape, 4 x 10 (in/yd) (Home Health) (Generic) 3 x Per Week/30 Days Discharge Instructions: Secure with tape as directed. 1. I would recommend that we go ahead and continue with the wound care measures as before and the patient is in agreement with plan. This includes the use of the [facility] which I think is really doing quite excellent. 2. I am also can recommend that we continue to monitor for any signs of worsening infection if anything changes he  should contact the office or his family and let us know otherwise we will see where things stand at follow-up in 2 weeks. We will see patient back for reevaluation in 1 week here in the clinic. If anything worsens or changes patient will contact our office for additional recommendations. Electronic Signature(s) Signed: 04/30/2022 3:31:17 PM By: Worthy Keeler PA-C Entered By: Worthy Keeler on 04/30/2022 15:31:17 -------------------------------------------------------------------------------- SuperBill Details Patient Name: Date of Service: Hector Beltran, Hector RRY N. 04/30/2022 Medical Record Number: [id number] Patient Account Number: [id number] Date of Birth/Sex: Treating RN: [birth year] (88 y.o. [name] Primary Care Provider: Seward Carol Other Clinician: Referring Provider: Treating Provider/Extender: Jennye Moccasin in Treatment: 9 Diagnosis Coding ICD-10 Codes Code Description [phone] Pressure ulcer of left heel, stage 3 Z96.642 Presence of left artificial hip joint I10 Essential (primary) hypertension I25.10 Atherosclerotic heart disease of native coronary artery without angina pectoris J44.9 Chronic obstructive pulmonary disease, unspecified I50.42 Chronic combined systolic (congestive) and diastolic (congestive) heart failure E44.0 Moderate protein-calorie malnutrition Facility Procedures CPT4 Code: [id number] Description: [id number] - DEB SUBQ TISSUE 20 SQ CM/< ICD-10 Diagnosis Description L89.623 Pressure ulcer of left heel, stage 3 Modifier: Quantity: 1 Physician Procedures : CPT4 Code Description Modifier [id number] - WC PHYS SUBQ TISS 20 SQ CM ICD-10 Diagnosis Description L89.623 Pressure ulcer of left heel, stage 3 Quantity: 1 Electronic Signature(s) Signed: 04/30/2022 3:31:36 PM By: Worthy Keeler PA-C Entered By: Worthy Keeler on 04/30/2022 15:31:36

## 2022-05-02 DIAGNOSIS — R262 Difficulty in walking, not elsewhere classified: Secondary | ICD-10-CM | POA: Diagnosis not present

## 2022-05-02 DIAGNOSIS — Z96642 Presence of left artificial hip joint: Secondary | ICD-10-CM | POA: Diagnosis not present

## 2022-05-02 DIAGNOSIS — S72002D Fracture of unspecified part of neck of left femur, subsequent encounter for closed fracture with routine healing: Secondary | ICD-10-CM | POA: Diagnosis not present

## 2022-05-02 DIAGNOSIS — Z9181 History of falling: Secondary | ICD-10-CM | POA: Diagnosis not present

## 2022-05-02 DIAGNOSIS — M6281 Muscle weakness (generalized): Secondary | ICD-10-CM | POA: Diagnosis not present

## 2022-05-05 DIAGNOSIS — S72002D Fracture of unspecified part of neck of left femur, subsequent encounter for closed fracture with routine healing: Secondary | ICD-10-CM | POA: Diagnosis not present

## 2022-05-05 DIAGNOSIS — R262 Difficulty in walking, not elsewhere classified: Secondary | ICD-10-CM | POA: Diagnosis not present

## 2022-05-05 DIAGNOSIS — M6281 Muscle weakness (generalized): Secondary | ICD-10-CM | POA: Diagnosis not present

## 2022-05-05 DIAGNOSIS — Z96642 Presence of left artificial hip joint: Secondary | ICD-10-CM | POA: Diagnosis not present

## 2022-05-05 DIAGNOSIS — Z9181 History of falling: Secondary | ICD-10-CM | POA: Diagnosis not present

## 2022-05-06 DIAGNOSIS — Z9181 History of falling: Secondary | ICD-10-CM | POA: Diagnosis not present

## 2022-05-06 DIAGNOSIS — M6281 Muscle weakness (generalized): Secondary | ICD-10-CM | POA: Diagnosis not present

## 2022-05-06 DIAGNOSIS — Z96642 Presence of left artificial hip joint: Secondary | ICD-10-CM | POA: Diagnosis not present

## 2022-05-06 DIAGNOSIS — S72002D Fracture of unspecified part of neck of left femur, subsequent encounter for closed fracture with routine healing: Secondary | ICD-10-CM | POA: Diagnosis not present

## 2022-05-06 DIAGNOSIS — R262 Difficulty in walking, not elsewhere classified: Secondary | ICD-10-CM | POA: Diagnosis not present

## 2022-05-07 DIAGNOSIS — M6281 Muscle weakness (generalized): Secondary | ICD-10-CM | POA: Diagnosis not present

## 2022-05-07 DIAGNOSIS — Z9181 History of falling: Secondary | ICD-10-CM | POA: Diagnosis not present

## 2022-05-08 ENCOUNTER — Ambulatory Visit (INDEPENDENT_AMBULATORY_CARE_PROVIDER_SITE_OTHER): Payer: Medicare PPO | Admitting: Podiatry

## 2022-05-08 ENCOUNTER — Encounter: Payer: Self-pay | Admitting: Podiatry

## 2022-05-08 DIAGNOSIS — M6281 Muscle weakness (generalized): Secondary | ICD-10-CM | POA: Diagnosis not present

## 2022-05-08 DIAGNOSIS — Z9181 History of falling: Secondary | ICD-10-CM | POA: Diagnosis not present

## 2022-05-08 DIAGNOSIS — Z96642 Presence of left artificial hip joint: Secondary | ICD-10-CM | POA: Diagnosis not present

## 2022-05-08 DIAGNOSIS — L84 Corns and callosities: Secondary | ICD-10-CM

## 2022-05-08 DIAGNOSIS — S72002D Fracture of unspecified part of neck of left femur, subsequent encounter for closed fracture with routine healing: Secondary | ICD-10-CM | POA: Diagnosis not present

## 2022-05-08 DIAGNOSIS — R262 Difficulty in walking, not elsewhere classified: Secondary | ICD-10-CM | POA: Diagnosis not present

## 2022-05-08 NOTE — Progress Notes (Signed)
Subjective:   Patient ID: Hector Beltran, male   DOB: 88 y.o.   MRN: [id number]   HPI Patient is being treated at the wound care clinic for ulcerations and has painful crusted tissue on the second digit right third digit right that they want trimmed and they want his tissue looked at in his skin looked at   ROS      Objective:  Physical Exam  Neurovascular status unchanged with patient having dressing on his left heel that is stable and the right second digit has a small breakdown of tissue on the proximal phalanx but it is local with no erythema edema or other pathology with crusted tissue scab keratotic lesion second digit and on the distal third digit     Assessment:  Patient with poor circulatory status with redness in his feet which is more related to circulatory status with lesion formation second and third digit painful     Plan:  Reviewed the continuation of going to the wound care clinic debrided lesion second toe debrided lesion third toe reappoint to recheck

## 2022-05-13 DIAGNOSIS — R262 Difficulty in walking, not elsewhere classified: Secondary | ICD-10-CM | POA: Diagnosis not present

## 2022-05-13 DIAGNOSIS — Z9181 History of falling: Secondary | ICD-10-CM | POA: Diagnosis not present

## 2022-05-13 DIAGNOSIS — S72002D Fracture of unspecified part of neck of left femur, subsequent encounter for closed fracture with routine healing: Secondary | ICD-10-CM | POA: Diagnosis not present

## 2022-05-13 DIAGNOSIS — M6281 Muscle weakness (generalized): Secondary | ICD-10-CM | POA: Diagnosis not present

## 2022-05-13 DIAGNOSIS — Z96642 Presence of left artificial hip joint: Secondary | ICD-10-CM | POA: Diagnosis not present

## 2022-05-14 ENCOUNTER — Encounter (HOSPITAL_BASED_OUTPATIENT_CLINIC_OR_DEPARTMENT_OTHER): Payer: Medicare PPO | Admitting: Physician Assistant

## 2022-05-14 DIAGNOSIS — Z9181 History of falling: Secondary | ICD-10-CM | POA: Diagnosis not present

## 2022-05-14 DIAGNOSIS — I110 Hypertensive heart disease with heart failure: Secondary | ICD-10-CM | POA: Diagnosis not present

## 2022-05-14 DIAGNOSIS — Z96642 Presence of left artificial hip joint: Secondary | ICD-10-CM | POA: Diagnosis not present

## 2022-05-14 DIAGNOSIS — E440 Moderate protein-calorie malnutrition: Secondary | ICD-10-CM | POA: Diagnosis not present

## 2022-05-14 DIAGNOSIS — Z6824 Body mass index (BMI) 24.0-24.9, adult: Secondary | ICD-10-CM | POA: Diagnosis not present

## 2022-05-14 DIAGNOSIS — M6281 Muscle weakness (generalized): Secondary | ICD-10-CM | POA: Diagnosis not present

## 2022-05-14 DIAGNOSIS — J449 Chronic obstructive pulmonary disease, unspecified: Secondary | ICD-10-CM | POA: Diagnosis not present

## 2022-05-14 DIAGNOSIS — L89623 Pressure ulcer of left heel, stage 3: Secondary | ICD-10-CM | POA: Diagnosis not present

## 2022-05-14 DIAGNOSIS — I2510 Atherosclerotic heart disease of native coronary artery without angina pectoris: Secondary | ICD-10-CM | POA: Diagnosis not present

## 2022-05-14 DIAGNOSIS — I5042 Chronic combined systolic (congestive) and diastolic (congestive) heart failure: Secondary | ICD-10-CM | POA: Diagnosis not present

## 2022-05-14 NOTE — Progress Notes (Addendum)
RESHARD, KITTELSON ([id number]) Visit Report for 05/14/2022 Chief Complaint Document Details Patient Name: Date of Service: Hector Beltran 05/14/2022 2:45 PM Medical Record Number: [id number] Patient Account Number: [id number] Date of Birth/Sex: Treating RN: June 29, 1928 (88 y.o. [name] Primary Care Provider: Seward Carol Other Clinician: Referring Provider: Treating Provider/Extender: Jennye Moccasin in Treatment: 11 Information Obtained from: Patient Chief Complaint Pressure ulcer left heel and right 2nd toe Electronic Signature(s) Signed: 05/14/2022 5:24:35 PM By: Worthy Keeler PA-C Previous Signature: 05/14/2022 3:53:42 PM Version By: Worthy Keeler PA-C Entered By: Worthy Keeler on 05/14/2022 17:24:35 -------------------------------------------------------------------------------- Debridement Details Patient Name: Date of Service: Hector Beltran, Hector Beltran 05/14/2022 2:45 PM Medical Record Number: [id number] Patient Account Number: [id number] Date of Birth/Sex: Treating RN: 1927-12-14 (88 y.o. [name] Primary Care Provider: Seward Carol Other Clinician: Referring Provider: Treating Provider/Extender: Jennye Moccasin in Treatment: 11 Debridement Performed for Assessment: Wound #1 Left Calcaneus Performed By: Physician Worthy Keeler, PA Debridement Type: Debridement Level of Consciousness (Pre-procedure): Awake and Alert Pre-procedure Verification/Time Out Yes - 15:56 Taken: Start Time: 15:57 Pain Control: Lidocaine 5% topical ointment T Area Debrided (L x W): otal 0.5 (cm) x 0.5 (cm) = 0.25 (cm) Tissue and other material debrided: Non-Viable, Callus, Skin: Dermis Level: Skin/Dermis Debridement Description: Selective/Open Wound Instrument: Curette Bleeding: Minimum Hemostasis Achieved: Pressure End Time: 16:02 Response to Treatment: Procedure was tolerated well Level of Consciousness (Post- Awake and  Alert procedure): Post Debridement Measurements of Total Wound Length: (cm) 0.5 Stage: Category/Stage III Width: (cm) 0.5 Depth: (cm) 0.1 Volume: (cm) 0.02 Character of Wound/Ulcer Post Debridement: Stable Post Procedure Diagnosis Same as Pre-procedure Electronic Signature(s) Signed: 05/14/2022 5:14:36 PM By: Lorrin Jackson Signed: 05/14/2022 6:03:16 PM By: Worthy Keeler PA-C Entered By: Lorrin Jackson on 05/14/2022 16:04:57 -------------------------------------------------------------------------------- HPI Details Patient Name: Date of Service: Hector Beltran, Hector Beltran. 05/14/2022 2:45 PM Medical Record Number: [id number] Patient Account Number: [id number] Date of Birth/Sex: Treating RN: December 08, 1927 (88 y.o. [name] Primary Care Provider: Seward Carol Other Clinician: Referring Provider: Treating Provider/Extender: Jennye Moccasin in Treatment: 11 History of Present Illness HPI Description: 02-26-2022 upon evaluation today patient presents for initial inspection here in our clinic concerning issues that he has been having with his left heel he has a pressure ulcer that ensued following having fallen and fractured his hip. This actually occurred in April and he had surgery for total hip replacement on the left of December 24, 2021. He was discharged to a SNF at the point of leaving the hospital and it sometime during the hospital stay and SNF stay he developed a pressure ulcer to his left heel. At this point it does not seem to be getting any worse he is at [facility] independent living and they need orders to know what to do. Right now they do think that they can do the dressing changes but he needs to have the supplies and the recommendations on hand. He did have an x-ray performed on 02-13-2022 which was negative for osteomyelitis this is good news right now they just been using a dry dressing and that is good. Patient does have a history of a left hip  replacement total, hypertension, coronary artery disease, COPD, congestive heart failure, and protein calorie malnutrition as noted by low serum albumin and protein. 03-05-2022 upon evaluation today patient appears to be doing well currently in regard to his heel. Unfortunately  the dressing that we put on is the one that came back in with. The facility which was supposed to be changing this for him we got all the supplies for them but they never actually changed the dressing. With that being said this obviously is frustrating for the family who states they are definitely can reach out to the facility regard. They will get that straightened out. 03-12-2022 upon evaluation today patient appears to be doing well with regard to his wound although he still is not getting this changed through the week. He is pretty much staying off only put on 20 gets back. Obviously this is not the way we wanted to proceed with this but nonetheless we had a hard time getting home health out to do what they needed to do. We are going to follow-up with a phone call and try to see what the deal is as far as them receiving our orders. We faxed them but unfortunately they do not seem to get them they say to see if there is a different fax number. 03-19-2022 upon evaluation today patient appears to be doing well currently in regard to his wound. In fact this has some slough buildup but seems to be doing quite well. I do not see any evidence of active infection at this time which is great news. No fevers, chills, nausea, vomiting, or diarrhea. 03-26-2022 upon evaluation today patient's wound is showing signs of improvement. Fortunately there does not appear to be any evidence of active infection locally or systemically at this time which is great news. No fevers, chills, nausea, vomiting, or diarrhea. 04-02-2022 upon evaluation today patient appears to be doing well currently in regard to his wound. He is actually showing signs of  improvement which is great news. Fortunately I do not see any evidence of active infection locally or systemically. There was a lot of dry skin around the edges of the wound once I remove this and actually clean the surface of the wound this is measuring significantly smaller which is great news. I am very pleased in that regard. 04-16-2022 upon evaluation today patient appears to be doing well currently in regard to his wound. He has been tolerating the dressing changes there is been some need for sharp debridement today. Fortunately I do not see any evidence of active infection locally or systemically at this time. 04-30-2022 upon evaluation today patient appears to be doing well with regard to his wound this is actually significantly smaller. The good news is I do believe we are making excellent progress here and I think the wound is looking so much better. 05-14-2022 upon evaluation today patient's wound on the left heel appears to be doing quite well. Fortunately I do not see any evidence of infection at this time. He does have a new wound on his right second toe which does appear to be a pressure injury. Electronic Signature(s) Signed: 05/14/2022 5:24:43 PM By: Worthy Keeler PA-C Entered By: Worthy Keeler on 05/14/2022 17:24:43 -------------------------------------------------------------------------------- Chemical Cauterization Details Patient Name: Date of Service: Hector Beltran, Hector Beltran 05/14/2022 2:45 PM Medical Record Number: [id number] Patient Account Number: [id number] Date of Birth/Sex: Treating RN: 1927/11/19 (88 y.o. [name] Primary Care Provider: Seward Carol Other Clinician: Referring Provider: Treating Provider/Extender: Jennye Moccasin in Treatment: 11 Procedure Performed for: Wound #2 Right T Second oe Performed By: Physician Worthy Keeler, PA Post Procedure Diagnosis Same as Pre-procedure Electronic Signature(s) Signed: 05/14/2022 5:14:36  PM  By: Lorrin Jackson Signed: 05/14/2022 6:03:16 PM By: Worthy Keeler PA-C Entered By: Lorrin Jackson on 05/14/2022 16:00:00 -------------------------------------------------------------------------------- Physical Exam Details Patient Name: Date of Service: Hector Beltran, Hector Beltran 05/14/2022 2:45 PM Medical Record Number: [id number] Patient Account Number: [id number] Date of Birth/Sex: Treating RN: April 20, 1928 (88 y.o. [name] Primary Care Provider: Seward Carol Other Clinician: Referring Provider: Treating Provider/Extender: Jennye Moccasin in Treatment: 14 Constitutional Well-nourished and well-hydrated in no acute distress. Respiratory normal breathing without difficulty. Psychiatric this patient is able to make decisions and demonstrates good insight into disease process. Alert and Oriented x 3. pleasant and cooperative. Notes Upon inspection patient's wound did require chemical cauterization in regard to the toe he also required some debridement in regard to the heel. The heel is almost completely done. Nonetheless I think that this is still open. Electronic Signature(s) Signed: 05/14/2022 5:25:07 PM By: Worthy Keeler PA-C Entered By: Worthy Keeler on 05/14/2022 17:25:06 -------------------------------------------------------------------------------- Physician Orders Details Patient Name: Date of Service: Hector Beltran, Hector Beltran 05/14/2022 2:45 PM Medical Record Number: [id number] Patient Account Number: [id number] Date of Birth/Sex: Treating RN: 21-Oct-1927 (88 y.o. [name] Primary Care Provider: Seward Carol Other Clinician: Referring Provider: Treating Provider/Extender: Jennye Moccasin in Treatment: 11 Verbal / Phone Orders: No Diagnosis Coding ICD-10 Coding Code Description [phone] Pressure ulcer of left heel, stage 3 Z96.642 Presence of left artificial hip joint I10 Essential (primary) hypertension I25.10  Atherosclerotic heart disease of native coronary artery without angina pectoris J44.9 Chronic obstructive pulmonary disease, unspecified I50.42 Chronic combined systolic (congestive) and diastolic (congestive) heart failure E44.0 Moderate protein-calorie malnutrition Follow-up Appointments ppointment in 2 weeks. - 05/28/22 @ 2:30pm with Toney Rakes, RN (Room 8) Return A Return appointment in 3 weeks. - 06/04/22 @ 2:45pm with Orland Jarred, RN (Room 7) Bathing/ Shower/ Hygiene May shower with protection but do not get wound dressing(s) wet. - May use cast protector bag from [employer], [place] or [facility] on days dressing not being changed. May shower and wash wound with soap and water. - May wash with antibacterial soap when changing dressing. Edema Control - Lymphedema / SCD / Other Elevate legs to the level of the heart or above for 30 minutes daily and/or when sitting, a frequency of: - throughout the day Avoid standing for long periods of time. Off-Loading Other: - Avoid/Limit pressure to heel. Recommend wearing a sandal that does not rub heel area. Additional Orders / Instructions Follow Nutritious Diet - High Protein Diet: 100g of Protein daily Other: - May participate in PT if has appropriate footwear. Non Wound Condition pply the following to affected area as directed: - Apply Antifungal/Athletes Foot cream to right foot. A Wound Treatment Wound #1 - Calcaneus Wound Laterality: Left Cleanser: Soap and Water 3 x Per Week/30 Days Discharge Instructions: Wash with Dial Antibacterial Soap Prim Dressing: Hydrofera Blue Ready Foam, 4x5 in (Generic) 3 x Per Week/30 Days ary Discharge Instructions: Apply to wound bed as instructed Secondary Dressing: Woven Gauze Sponge, Non-Sterile 4x4 in (Generic) 3 x Per Week/30 Days Discharge Instructions: Apply over primary dressing as directed. Secured With: [employer], 4.5x3.1 (in/yd) (Generic) 3 x Per Week/30 Days Discharge Instructions:  Secure with Kerlix as directed. Secured With: 36M Medipore H Soft Cloth Surgical T ape, 4 x 10 (in/yd) (Generic) 3 x Per Week/30 Days Discharge Instructions: Secure with tape as directed. Wound #2 - T Second oe Wound Laterality: Right Cleanser: Soap  and Water 3 x Per Week/30 Days Discharge Instructions: Wash with Dial Antibacterial Soap Prim Dressing: Hydrofera Blue Ready Foam, 4x5 in (Generic) 3 x Per Week/30 Days ary Discharge Instructions: Apply to wound bed as instructed Secondary Dressing: Woven Gauze Sponges 2x2 in 3 x Per Week/30 Days Discharge Instructions: Apply over primary dressing as directed. Secured With: Insurance underwriter, Sterile 2x75 (in/in) 3 x Per Week/30 Days Discharge Instructions: Secure with stretch gauze as directed. Secured With: 85M Medipore H Soft Cloth Surgical T ape, 4 x 10 (in/yd) (Generic) 3 x Per Week/30 Days Discharge Instructions: Secure with tape as directed. Patient Medications llergies: No Known Allergies A Notifications Medication Indication Start End 05/14/2022 Bactrim DS DOSE 1 - oral 800 mg-160 mg tablet - 1 tablet oral taken 2 times per day for 10 days Electronic Signature(s) Signed: 05/14/2022 4:27:53 PM By: Lenda Kelp PA-C Entered By: Lenda Kelp on 05/14/2022 16:27:53 -------------------------------------------------------------------------------- Problem List Details Patient Name: Date of Service: [name], [name] 05/14/2022 2:45 PM Medical Record Number: [id number] Patient Account Number: [id number] Date of Birth/Sex: Treating RN: 28-Apr-1928 (88 y.o. Lytle Michaels Primary Care Provider: Renford Dills Other Clinician: Referring Provider: Treating Provider/Extender: Prudencio Burly in Treatment: 11 Active Problems ICD-10 Encounter Code Description Active Date MDM Diagnosis [phone] Pressure ulcer of left heel, stage 3 02/26/2022 No Yes L89.893 Pressure ulcer of other site, stage 3  05/14/2022 No Yes Z96.642 Presence of left artificial hip joint 02/26/2022 No Yes I10 Essential (primary) hypertension 02/26/2022 No Yes I25.10 Atherosclerotic heart disease of native coronary artery without angina pectoris 02/26/2022 No Yes J44.9 Chronic obstructive pulmonary disease, unspecified 02/26/2022 No Yes I50.42 Chronic combined systolic (congestive) and diastolic (congestive) heart failure 02/26/2022 No Yes E44.0 Moderate protein-calorie malnutrition 02/26/2022 No Yes Inactive Problems Resolved Problems Electronic Signature(s) Signed: 05/14/2022 5:24:21 PM By: Lenda Kelp PA-C Previous Signature: 05/14/2022 3:53:32 PM Version By: Lenda Kelp PA-C Entered By: Lenda Kelp on 05/14/2022 17:24:21 -------------------------------------------------------------------------------- Progress Note Details Patient Name: Date of Service: [name], [name] 05/14/2022 2:45 PM Medical Record Number: [id number] Patient Account Number: [id number] Date of Birth/Sex: Treating RN: 1927/11/08 (88 y.o. Lytle Michaels Primary Care Provider: Renford Dills Other Clinician: Referring Provider: Treating Provider/Extender: Prudencio Burly in Treatment: 11 Subjective Chief Complaint Information obtained from Patient Pressure ulcer left heel and right 2nd toe History of Present Illness (HPI) 02-26-2022 upon evaluation today patient presents for initial inspection here in our clinic concerning issues that he has been having with his left heel he has a pressure ulcer that ensued following having fallen and fractured his hip. This actually occurred in April and he had surgery for total hip replacement on the left of December 24, 2021. He was discharged to a SNF at the point of leaving the hospital and it sometime during the hospital stay and SNF stay he developed a pressure ulcer to his left heel. At this point it does not seem to be getting any worse he is at [facility] independent  living and they need orders to know what to do. Right now they do think that they can do the dressing changes but he needs to have the supplies and the recommendations on hand. He did have an x-ray performed on 02-13-2022 which was negative for osteomyelitis this is good news right now they just been using a dry dressing and that is good. Patient does have a history of a left  hip replacement total, hypertension, coronary artery disease, COPD, congestive heart failure, and protein calorie malnutrition as noted by low serum albumin and protein. 03-05-2022 upon evaluation today patient appears to be doing well currently in regard to his heel. Unfortunately the dressing that we put on is the one that came back in with. The facility which was supposed to be changing this for him we got all the supplies for them but they never actually changed the dressing. With that being said this obviously is frustrating for the family who states they are definitely can reach out to the facility regard. They will get that straightened out. 03-12-2022 upon evaluation today patient appears to be doing well with regard to his wound although he still is not getting this changed through the week. He is pretty much staying off only put on 20 gets back. Obviously this is not the way we wanted to proceed with this but nonetheless we had a hard time getting home health out to do what they needed to do. We are going to follow-up with a phone call and try to see what the deal is as far as them receiving our orders. We faxed them but unfortunately they do not seem to get them they say to see if there is a different fax number. 03-19-2022 upon evaluation today patient appears to be doing well currently in regard to his wound. In fact this has some slough buildup but seems to be doing quite well. I do not see any evidence of active infection at this time which is great news. No fevers, chills, nausea, vomiting, or diarrhea. 03-26-2022 upon  evaluation today patient's wound is showing signs of improvement. Fortunately there does not appear to be any evidence of active infection locally or systemically at this time which is great news. No fevers, chills, nausea, vomiting, or diarrhea. 04-02-2022 upon evaluation today patient appears to be doing well currently in regard to his wound. He is actually showing signs of improvement which is great news. Fortunately I do not see any evidence of active infection locally or systemically. There was a lot of dry skin around the edges of the wound once I remove this and actually clean the surface of the wound this is measuring significantly smaller which is great news. I am very pleased in that regard. 04-16-2022 upon evaluation today patient appears to be doing well currently in regard to his wound. He has been tolerating the dressing changes there is been some need for sharp debridement today. Fortunately I do not see any evidence of active infection locally or systemically at this time. 04-30-2022 upon evaluation today patient appears to be doing well with regard to his wound this is actually significantly smaller. The good news is I do believe we are making excellent progress here and I think the wound is looking so much better. 05-14-2022 upon evaluation today patient's wound on the left heel appears to be doing quite well. Fortunately I do not see any evidence of infection at this time. He does have a new wound on his right second toe which does appear to be a pressure injury. Objective Constitutional Well-nourished and well-hydrated in no acute distress. Vitals Time Taken: 3:20 PM, Height: 75 in, Weight: 192 lbs, BMI: 24, Temperature: 98.2 F, Pulse: 73 bpm, Respiratory Rate: 20 breaths/min, Blood Pressure: 150/72 mmHg. Respiratory normal breathing without difficulty. Psychiatric this patient is able to make decisions and demonstrates good insight into disease process. Alert and Oriented x 3.  pleasant and  cooperative. General Notes: Upon inspection patient's wound did require chemical cauterization in regard to the toe he also required some debridement in regard to the heel. The heel is almost completely done. Nonetheless I think that this is still open. Integumentary (Hair, Skin) Wound #1 status is Open. Original cause of wound was Pressure Injury. The date acquired was: 01/08/2022. The wound has been in treatment 11 weeks. The wound is located on the Left Calcaneus. The wound measures 0.5cm length x 0.5cm width x 0.1cm depth; 0.196cm^2 area and 0.02cm^3 volume. There is Fat Layer (Subcutaneous Tissue) exposed. There is no tunneling or undermining noted. There is a medium amount of serosanguineous drainage noted. The wound margin is distinct with the outline attached to the wound base. There is large (67-100%) red, pink granulation within the wound bed. There is a small (1-33%) amount of necrotic tissue within the wound bed including Adherent Slough. General Notes: callous noted Wound #2 status is Open. Original cause of wound was Blister. The date acquired was: 05/02/2022. The wound is located on the Right T Second. The wound oe measures 0.9cm length x 1.5cm width x 0.2cm depth; 1.06cm^2 area and 0.212cm^3 volume. There is Fat Layer (Subcutaneous Tissue) exposed. There is no tunneling or undermining noted. There is a medium amount of serosanguineous drainage noted. The wound margin is distinct with the outline attached to the wound base. There is medium (34-66%) red, pink granulation within the wound bed. There is a medium (34-66%) amount of necrotic tissue within the wound bed including Adherent Slough. Assessment Active Problems ICD-10 Pressure ulcer of left heel, stage 3 Pressure ulcer of other site, stage 3 Presence of left artificial hip joint Essential (primary) hypertension Atherosclerotic heart disease of native coronary artery without angina pectoris Chronic obstructive  pulmonary disease, unspecified Chronic combined systolic (congestive) and diastolic (congestive) heart failure Moderate protein-calorie malnutrition Procedures Wound #1 Pre-procedure diagnosis of Wound #1 is a Pressure Ulcer located on the Left Calcaneus . There was a Selective/Open Wound Skin/Dermis Debridement with a total area of 0.25 sq cm performed by Lenda Kelp, PA. With the following instrument(s): Curette to remove Non-Viable tissue/material. Material removed includes Callus and Skin: Dermis and after achieving pain control using Lidocaine 5% topical ointment. No specimens were taken. A time out was conducted at 15:56, prior to the start of the procedure. A Minimum amount of bleeding was controlled with Pressure. The procedure was tolerated well. Post Debridement Measurements: 0.5cm length x 0.5cm width x 0.1cm depth; 0.02cm^3 volume. Post debridement Stage noted as Category/Stage III. Character of Wound/Ulcer Post Debridement is stable. Post procedure Diagnosis Wound #1: Same as Pre-Procedure Wound #2 Pre-procedure diagnosis of Wound #2 is a Pressure Ulcer located on the Right T Second . An Chemical Cauterization procedure was performed by Lindi Adie, PA. Post procedure Diagnosis Wound #2: Same as Pre-Procedure Plan Follow-up Appointments: Return Appointment in 2 weeks. - 05/28/22 @ 2:30pm with Aldona Lento, RN (Room 8) Return appointment in 3 weeks. - 06/04/22 @ 2:45pm with Shelda Altes, RN (Room 7) Bathing/ Shower/ Hygiene: May shower with protection but do not get wound dressing(s) wet. - May use cast protector bag from [employer], [facility] or [facility] on days dressing not being changed. May shower and wash wound with soap and water. - May wash with antibacterial soap when changing dressing. Edema Control - Lymphedema / SCD / Other: Elevate legs to the level of the heart or above for 30 minutes daily and/or when sitting, a frequency  of: - throughout the day Avoid  standing for long periods of time. Off-Loading: Other: - Avoid/Limit pressure to heel. Recommend wearing a sandal that does not rub heel area. Additional Orders / Instructions: Follow Nutritious Diet - High Protein Diet: 100g of Protein daily Other: - May participate in PT if has appropriate footwear. Non Wound Condition: Apply the following to affected area as directed: - Apply Antifungal/Athletes Foot cream to right foot. The following medication(s) was prescribed: Bactrim DS oral 800 mg-160 mg tablet 1 1 tablet oral taken 2 times per day for 10 days starting 05/14/2022 WOUND #1: - Calcaneus Wound Laterality: Left Cleanser: Soap and Water 3 x Per Week/30 Days Discharge Instructions: Wash with Dial Antibacterial Soap Prim Dressing: Hydrofera Blue Ready Foam, 4x5 in (Generic) 3 x Per Week/30 Days ary Discharge Instructions: Apply to wound bed as instructed Secondary Dressing: Woven Gauze Sponge, Non-Sterile 4x4 in (Generic) 3 x Per Week/30 Days Discharge Instructions: Apply over primary dressing as directed. Secured With: [employer], 4.5x3.1 (in/yd) (Generic) 3 x Per Week/30 Days Discharge Instructions: Secure with Kerlix as directed. Secured With: 21M Medipore H Soft Cloth Surgical T ape, 4 x 10 (in/yd) (Generic) 3 x Per Week/30 Days Discharge Instructions: Secure with tape as directed. WOUND #2: - T Second Wound Laterality: Right oe Cleanser: Soap and Water 3 x Per Week/30 Days Discharge Instructions: Wash with Dial Antibacterial Soap Prim Dressing: Hydrofera Blue Ready Foam, 4x5 in (Generic) 3 x Per Week/30 Days ary Discharge Instructions: Apply to wound bed as instructed Secondary Dressing: Woven Gauze Sponges 2x2 in 3 x Per Week/30 Days Discharge Instructions: Apply over primary dressing as directed. Secured With: Child psychotherapist, Sterile 2x75 (in/in) 3 x Per Week/30 Days Discharge Instructions: Secure with stretch gauze as directed. Secured With: 21M  Medipore H Soft Cloth Surgical T ape, 4 x 10 (in/yd) (Generic) 3 x Per Week/30 Days Discharge Instructions: Secure with tape as directed. 1. I am going to recommend that we go ahead and continue with the recommendation for wound care measures as before and the patient is in agreement with plan. This includes the use of the [facility] which I think is really doing quite well. 2. I am also can recommend that we have the patient continue to monitor for any signs of worsening or infection. If anything changes he should contact the office and let me know. Otherwise working to see where things stand at follow-up in 2 weeks time. We will see patient back for reevaluation in 2 weeks here in the clinic. If anything worsens or changes patient will contact our office for additional recommendations. Electronic Signature(s) Signed: 05/14/2022 5:25:34 PM By: Worthy Keeler PA-C Entered By: Worthy Keeler on 05/14/2022 17:25:34 -------------------------------------------------------------------------------- SuperBill Details Patient Name: Date of Service: Hector Beltran, [place] 05/14/2022 Medical Record Number: [id number] Patient Account Number: [id number] Date of Birth/Sex: Treating RN: Sep 06, 1928 (88 y.o. 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

## 2022-05-14 NOTE — Progress Notes (Signed)
GEARALD, STONEBRAKER ([id number]) Visit Report for 05/14/2022 Arrival Information Details Patient Name: Date of Service: Hector Beltran 05/14/2022 2:45 PM Medical Record Number: [id number] Patient Account Number: [id number] Date of Birth/Sex: Treating RN: January 03, 1928 (88 y.o. [name] Primary Care Mikell Camp: Seward Carol Other Clinician: Referring Taj Arteaga: Treating Collier Monica/Extender: Jennye Moccasin in Treatment: 11 Visit Information History Since Last Visit Added or deleted any medications: No Patient Arrived: Wheel Chair Any new allergies or adverse reactions: No Arrival Time: 15:17 Had a fall or experienced change in No Accompanied By: son and daughter in law activities of daily living that may affect Transfer Assistance: Manual risk of falls: Patient Identification Verified: Yes Signs or symptoms of abuse/neglect since last visito No Secondary Verification Process Completed: Yes Hospitalized since last visit: No Patient Requires Transmission-Based Precautions: No Implantable device outside of the clinic excluding No Patient Has Alerts: No cellular tissue based products placed in the center since last visit: Has Dressing in Place as Prescribed: Yes Pain Present Now: No Electronic Signature(s) Signed: 05/14/2022 5:14:36 PM By: Lorrin Jackson Entered By: Lorrin Jackson on 05/14/2022 15:18:05 -------------------------------------------------------------------------------- Encounter Discharge Information Details Patient Name: Date of Service: [name], [name] 05/14/2022 2:45 PM Medical Record Number: [id number] Patient Account Number: [id number] Date of Birth/Sex: Treating RN: 01/07/1928 (88 y.o. [name] Primary Care Crystalina Stodghill: Seward Carol Other Clinician: Referring Reygan Heagle: Treating Tanaysha Alkins/Extender: Jennye Moccasin in Treatment: 11 Encounter Discharge Information Items Post Procedure Vitals Discharge Condition:  Stable Temperature (F): 98.2 Ambulatory Status: Wheelchair Pulse (bpm): 73 Discharge Destination: Home Respiratory Rate (breaths/min): 20 Transportation: Private Auto Blood Pressure (mmHg): 150/72 Accompanied By: son and daughter in law Schedule Follow-up Appointment: Yes Clinical Summary of Care: Provided on 05/14/2022 Form Type Recipient Paper Patient Patient Electronic Signature(s) Signed: 05/14/2022 5:13:35 PM By: Lorrin Jackson Entered By: Lorrin Jackson on 05/14/2022 17:13:35 -------------------------------------------------------------------------------- Lower Extremity Assessment Details Patient Name: Date of Service: Hector Beltran 05/14/2022 2:45 PM Medical Record Number: [id number] Patient Account Number: [id number] Date of Birth/Sex: Treating RN: 1928/09/02 (88 y.o. [name] Primary Care Seng Fouts: Seward Carol Other Clinician: Referring Souleymane Saiki: Treating Aleli Navedo/Extender: Jennye Moccasin in Treatment: 11 Edema Assessment Assessed: Shirlyn Goltz: Yes] Patrice Paradise: No] Edema: [Left: N] [Right: o] Calf Left: Right: Point of Measurement: 33 cm From Medial Instep 38 cm Ankle Left: Right: Point of Measurement: 10 cm From Medial Instep 25 cm Electronic Signature(s) Signed: 05/14/2022 5:14:36 PM By: Lorrin Jackson Entered By: Lorrin Jackson on 05/14/2022 15:21:03 -------------------------------------------------------------------------------- Multi Wound Chart Details Patient Name: Date of Service: [name], [name] 05/14/2022 2:45 PM Medical Record Number: [id number] Patient Account Number: [id number] Date of Birth/Sex: Treating RN: [birth year] (88 y.o. [name] Primary Care Suttyn Cryder: Seward Carol Other Clinician: Referring Quantavis Obryant: Treating Aayat Hajjar/Extender: Jennye Moccasin in Treatment: 11 Vital Signs Height(in): 75 Pulse(bpm): 88 Weight(lbs): 192 Blood Pressure(mmHg): 150/72 Body Mass Index(BMI):  24 Temperature(F): 98.2 Respiratory Rate(breaths/min): 20 Photos: [N/A:N/A] Left Calcaneus Right T Second oe N/A Wound Location: Pressure Injury Blister N/A Wounding Event: Pressure Ulcer Pressure Ulcer N/A Primary Etiology: Chronic Obstructive Pulmonary Chronic Obstructive Pulmonary N/A Comorbid History: Disease (COPD), Congestive Heart Disease (COPD), Congestive Heart Failure, Coronary Artery Disease, Failure, Coronary Artery Disease, Hypertension, Cirrhosis , Neuropathy Hypertension, Cirrhosis , Neuropathy 01/08/2022 05/02/2022 N/A Date Acquired: 11 0 N/A Weeks of Treatment: Open Open N/A Wound Status: No No N/A Wound Recurrence: 0.5x0.5x0.1 0.9x1.5x0.2 N/A Measurements L x W  x D (cm) 0.196 1.06 N/A A (cm) : rea 0.02 0.212 N/A Volume (cm) : 97.20% 0.00% N/A % Reduction in A rea: 98.60% 0.00% N/A % Reduction in Volume: Category/Stage III Category/Stage III N/A Classification: Medium Medium N/A Exudate A mount: Serosanguineous Serosanguineous N/A Exudate Type: red, brown red, brown N/A Exudate Color: Distinct, outline attached Distinct, outline attached N/A Wound Margin: Large (67-100%) Medium (34-66%) N/A Granulation A mount: Red, Pink Red, Pink N/A Granulation Quality: Small (1-33%) Medium (34-66%) N/A Necrotic A mount: Fat Layer (Subcutaneous Tissue): Yes Fat Layer (Subcutaneous Tissue): Yes N/A Exposed Structures: Fascia: No Fascia: No Tendon: No Tendon: No Muscle: No Muscle: No Joint: No Joint: No Bone: No Bone: No Large (67-100%) None N/A Epithelialization: Debridement - Selective/Open Wound N/A N/A Debridement: Pre-procedure Verification/Time Out 15:56 N/A N/A Taken: Lidocaine 5% topical ointment N/A N/A Pain Control: Callus N/A N/A Tissue Debrided: Skin/Dermis N/A N/A Level: 0.25 N/A N/A Debridement A (sq cm): rea Curette N/A N/A Instrument: Minimum N/A N/A Bleeding: Pressure N/A N/A Hemostasis A chieved: Procedure was  tolerated well N/A N/A Debridement Treatment Response: 0.5x0.5x0.1 N/A N/A Post Debridement Measurements L x W x D (cm) 0.02 N/A N/A Post Debridement Volume: (cm) Category/Stage III N/A N/A Post Debridement Stage: callous noted N/A N/A Assessment Notes: Debridement Chemical Cauterization N/A Procedures Performed: Treatment Notes Electronic Signature(s) Signed: 05/14/2022 5:14:36 PM By: Lorrin Jackson Entered By: Lorrin Jackson on 05/14/2022 16:23:58 -------------------------------------------------------------------------------- Multi-Disciplinary Care Plan Details Patient Name: Date of Service: [name], [name] 05/14/2022 2:45 PM Medical Record Number: [id number] Patient Account Number: [id number] Date of Birth/Sex: Treating RN: 1928/01/27 (88 y.o. [name] Primary Care Rosaelena Kemnitz: Seward Carol Other Clinician: Referring Jahleah Mariscal: Treating Shizuye Rupert/Extender: Jennye Moccasin in Treatment: 11 Active Inactive Wound/Skin Impairment Nursing Diagnoses: Impaired tissue integrity Goals: Patient/caregiver will verbalize understanding of skin care regimen Date Initiated: 02/26/2022 Target Resolution Date: 06/04/2022 Goal Status: Active Ulcer/skin breakdown will have a volume reduction of 30% by week 4 Date Initiated: 02/26/2022 Date Inactivated: 04/30/2022 Target Resolution Date: 04/30/2022 Goal Status: Met Ulcer/skin breakdown will have a volume reduction of 50% by week 8 Date Initiated: 04/30/2022 Target Resolution Date: 06/04/2022 Goal Status: Active Interventions: Assess patient/caregiver ability to obtain necessary supplies Assess patient/caregiver ability to perform ulcer/skin care regimen upon admission and as needed Assess ulceration(s) every visit Provide education on ulcer and skin care Treatment Activities: Topical wound management initiated : 02/26/2022 Notes: 04/02/22: Wound care regimen continues. Electronic Signature(s) Signed:  05/14/2022 5:14:36 PM By: Lorrin Jackson Entered By: Lorrin Jackson on 05/14/2022 15:28:55 -------------------------------------------------------------------------------- Pain Assessment Details Patient Name: Date of Service: [name], [name] 05/14/2022 2:45 PM Medical Record Number: [id number] Patient Account Number: [id number] Date of Birth/Sex: Treating RN: August 05, 1928 (88 y.o. [name] Primary Care Antonios Ostrow: Seward Carol Other Clinician: Referring Hayat Warbington: Treating Elfreida Heggs/Extender: Jennye Moccasin in Treatment: 11 Active Problems Location of Pain Severity and Description of Pain Patient Has Paino No Site Locations Pain Management and Medication Current Pain Management: Electronic Signature(s) Signed: 05/14/2022 5:14:36 PM By: Lorrin Jackson Entered By: Lorrin Jackson on 05/14/2022 15:20:28 -------------------------------------------------------------------------------- Patient/Caregiver Education Details Patient Name: Date of Service: Hector Beltran 8/30/2023andnbsp2:45 PM Medical Record Number: [id number] Patient Account Number: [id number] Date of Birth/Gender: Treating RN: 1928-03-17 (88 y.o. [name] Primary Care Physician: Seward Carol Other Clinician: Referring Physician: Treating Physician/Extender: Jennye Moccasin in Treatment: 11 Education Assessment Education Provided To: Patient Education Topics Provided Pressure: Methods:  Explain/Verbal, Printed Responses: State content correctly Wound/Skin Impairment: Methods: Explain/Verbal, Printed Responses: State content correctly Electronic Signature(s) Signed: 05/14/2022 5:14:36 PM By: Lorrin Jackson Entered By: Lorrin Jackson on 05/14/2022 15:29:28 -------------------------------------------------------------------------------- Wound Assessment Details Patient Name: Date of Service: [name], [name] 05/14/2022 2:45 PM Medical Record Number:  [id number] Patient Account Number: [id number] Date of Birth/Sex: Treating RN: [birth year] (87 y.o. [name] Primary Care Latasha Buczkowski: Seward Carol Other Clinician: Referring Ayslin Kundert: Treating Hari Casaus/Extender: Jennye Moccasin in Treatment: 11 Wound Status Wound Number: 1 Primary Pressure Ulcer Etiology: Wound Location: Left Calcaneus Wound Open Wounding Event: Pressure Injury Status: Date Acquired: 01/08/2022 Comorbid Chronic Obstructive Pulmonary Disease (COPD), Congestive Heart Weeks Of Treatment: 11 History: Failure, Coronary Artery Disease, Hypertension, Cirrhosis , Clustered Wound: No Neuropathy Photos Wound Measurements Length: (cm) 0.5 Width: (cm) 0.5 Depth: (cm) 0.1 Area: (cm) 0.196 Volume: (cm) 0.02 % Reduction in Area: 97.2% % Reduction in Volume: 98.6% Epithelialization: Large (67-100%) Tunneling: No Undermining: No Wound Description Classification: Category/Stage III Wound Margin: Distinct, outline attached Exudate Amount: Medium Exudate Type: Serosanguineous Exudate Color: red, brown Foul Odor After Cleansing: No Slough/Fibrino Yes Wound Bed Granulation Amount: Large (67-100%) Exposed Structure Granulation Quality: Red, Pink Fascia Exposed: No Necrotic Amount: Small (1-33%) Fat Layer (Subcutaneous Tissue) Exposed: Yes Necrotic Quality: Adherent Slough Tendon Exposed: No Muscle Exposed: No Joint Exposed: No Bone Exposed: No Assessment Notes callous noted Treatment Notes Wound #1 (Calcaneus) Wound Laterality: Left Cleanser Soap and Water Discharge Instruction: Wash with Dial Antibacterial Soap Peri-Wound Care Topical Primary Dressing Hydrofera Blue Ready Foam, 4x5 in Discharge Instruction: Apply to wound bed as instructed Secondary Dressing Woven Gauze Sponge, Non-Sterile 4x4 in Discharge Instruction: Apply over primary dressing as directed. Secured With [employer], 4.5x3.1 (in/yd) Discharge  Instruction: Secure with Kerlix as directed. 46M Medipore H Soft Cloth Surgical T ape, 4 x 10 (in/yd) Discharge Instruction: Secure with tape as directed. Compression Wrap Compression Stockings Add-Ons Electronic Signature(s) Signed: 05/14/2022 5:14:36 PM By: Lorrin Jackson Entered By: Lorrin Jackson on 05/14/2022 15:34:44 -------------------------------------------------------------------------------- Wound Assessment Details Patient Name: Date of Service: [name], [name] 05/14/2022 2:45 PM Medical Record Number: [id number] Patient Account Number: [id number] Date of Birth/Sex: Treating RN: 1928/01/24 (88 y.o. [name] Primary Care Nakiyah Beverley: Other Clinician: Seward Carol Referring Jamile Sivils: Treating Brittany Amirault/Extender: Jennye Moccasin in Treatment: 11 Wound Status Wound Number: 2 Primary Pressure Ulcer Etiology: Wound Location: Right T Second oe Wound Open Wounding Event: Blister Status: Date Acquired: 05/02/2022 Comorbid Chronic Obstructive Pulmonary Disease (COPD), Congestive Heart Weeks Of Treatment: 0 History: Failure, Coronary Artery Disease, Hypertension, Cirrhosis , Clustered Wound: No Neuropathy Photos Wound Measurements Length: (cm) 0.9 Width: (cm) 1.5 Depth: (cm) 0.2 Area: (cm) 1.06 Volume: (cm) 0.212 % Reduction in Area: 0% % Reduction in Volume: 0% Epithelialization: None Tunneling: No Undermining: No Wound Description Classification: Category/Stage III Wound Margin: Distinct, outline attached Exudate Amount: Medium Exudate Type: Serosanguineous Exudate Color: red, brown Foul Odor After Cleansing: No Slough/Fibrino Yes Wound Bed Granulation Amount: Medium (34-66%) Exposed Structure Granulation Quality: Red, Pink Fascia Exposed: No Necrotic Amount: Medium (34-66%) Fat Layer (Subcutaneous Tissue) Exposed: Yes Necrotic Quality: Adherent Slough Tendon Exposed: No Muscle Exposed: No Joint Exposed: No Bone Exposed:  No Treatment Notes Wound #2 (Toe Second) Wound Laterality: Right Cleanser Soap and Water Discharge Instruction: Wash with Dial Antibacterial Soap Peri-Wound Care Topical Primary Dressing Hydrofera Blue Ready Foam, 4x5 in Discharge Instruction: Apply to wound bed as instructed Secondary Dressing Woven  Gauze Sponges 2x2 in Discharge Instruction: Apply over primary dressing as directed. Secured With [employer] Stretch Gauze Bandage, Sterile 2x75 (in/in) Discharge Instruction: Secure with stretch gauze as directed. 31M Medipore H Soft Cloth Surgical T ape, 4 x 10 (in/yd) Discharge Instruction: Secure with tape as directed. Compression Wrap Compression Stockings Add-Ons Electronic Signature(s) Signed: 05/14/2022 5:14:36 PM By: Lorrin Jackson Entered By: Lorrin Jackson on 05/14/2022 15:35:15 -------------------------------------------------------------------------------- Vitals Details Patient Name: Date of Service: [name], [name] 05/14/2022 2:45 PM Medical Record Number: [id number] Patient Account Number: [id number] Date of Birth/Sex: Treating RN: 26-Sep-1927 (88 y.o. [name] Primary Care Jaiana Sheffer: Seward Carol Other Clinician: Referring Orvel Cutsforth: Treating Ayyub Krall/Extender: Jennye Moccasin in Treatment: 11 Vital Signs Time Taken: 15:20 Temperature (F): 98.2 Height (in): 75 Pulse (bpm): 73 Weight (lbs): 192 Respiratory Rate (breaths/min): 20 Body Mass Index (BMI): 24 Blood Pressure (mmHg): 150/72 Reference Range: 80 - 120 mg / dl Electronic Signature(s) Signed: 05/14/2022 5:14:36 PM By: Lorrin Jackson Entered By: Lorrin Jackson on 05/14/2022 15:20:23

## 2022-05-15 DIAGNOSIS — R262 Difficulty in walking, not elsewhere classified: Secondary | ICD-10-CM | POA: Diagnosis not present

## 2022-05-15 DIAGNOSIS — M6281 Muscle weakness (generalized): Secondary | ICD-10-CM | POA: Diagnosis not present

## 2022-05-15 DIAGNOSIS — Z9181 History of falling: Secondary | ICD-10-CM | POA: Diagnosis not present

## 2022-05-15 DIAGNOSIS — Z96642 Presence of left artificial hip joint: Secondary | ICD-10-CM | POA: Diagnosis not present

## 2022-05-15 DIAGNOSIS — S72002D Fracture of unspecified part of neck of left femur, subsequent encounter for closed fracture with routine healing: Secondary | ICD-10-CM | POA: Diagnosis not present

## 2022-05-16 ENCOUNTER — Encounter: Payer: Self-pay | Admitting: Nurse Practitioner

## 2022-05-16 ENCOUNTER — Ambulatory Visit (INDEPENDENT_AMBULATORY_CARE_PROVIDER_SITE_OTHER): Payer: Medicare PPO | Admitting: Nurse Practitioner

## 2022-05-16 VITALS — BP 172/78 | HR 90 | Temp 96.9°F | Ht 75.0 in | Wt 198.4 lb

## 2022-05-16 DIAGNOSIS — R21 Rash and other nonspecific skin eruption: Secondary | ICD-10-CM

## 2022-05-16 DIAGNOSIS — S72002D Fracture of unspecified part of neck of left femur, subsequent encounter for closed fracture with routine healing: Secondary | ICD-10-CM | POA: Diagnosis not present

## 2022-05-16 DIAGNOSIS — G609 Hereditary and idiopathic neuropathy, unspecified: Secondary | ICD-10-CM

## 2022-05-16 DIAGNOSIS — L97519 Non-pressure chronic ulcer of other part of right foot with unspecified severity: Secondary | ICD-10-CM | POA: Diagnosis not present

## 2022-05-16 DIAGNOSIS — R262 Difficulty in walking, not elsewhere classified: Secondary | ICD-10-CM | POA: Diagnosis not present

## 2022-05-16 DIAGNOSIS — M6281 Muscle weakness (generalized): Secondary | ICD-10-CM | POA: Diagnosis not present

## 2022-05-16 DIAGNOSIS — J449 Chronic obstructive pulmonary disease, unspecified: Secondary | ICD-10-CM | POA: Diagnosis not present

## 2022-05-16 DIAGNOSIS — E441 Mild protein-calorie malnutrition: Secondary | ICD-10-CM

## 2022-05-16 DIAGNOSIS — I5032 Chronic diastolic (congestive) heart failure: Secondary | ICD-10-CM

## 2022-05-16 DIAGNOSIS — Z9181 History of falling: Secondary | ICD-10-CM | POA: Diagnosis not present

## 2022-05-16 DIAGNOSIS — Z96642 Presence of left artificial hip joint: Secondary | ICD-10-CM | POA: Diagnosis not present

## 2022-05-16 DIAGNOSIS — R030 Elevated blood-pressure reading, without diagnosis of hypertension: Secondary | ICD-10-CM

## 2022-05-16 MED ORDER — NYSTATIN 100000 UNIT/GM EX OINT: 1.0000 | TOPICAL_OINTMENT | Freq: Two times a day (BID) | CUTANEOUS | 0 refills | Status: DC

## 2022-05-16 MED ORDER — TRIAMCINOLONE ACETONIDE 0.1 % EX CREA: 1.0000 | TOPICAL_CREAM | Freq: Two times a day (BID) | CUTANEOUS | 0 refills | Status: DC

## 2022-05-16 NOTE — Patient Instructions (Addendum)
To take nystatin with triamcinolone together twice daily to rash   [facility] [address] [place], [place] [address], [place]  Phone: [phone]

## 2022-05-16 NOTE — Progress Notes (Signed)
Careteam: Patient Care Team: Renford Dills, MD as PCP - General (Internal Medicine) Orbie Pyo, MD as PCP - Cardiology (Cardiology) Sherren Kerns, PT as Physical Therapist (Physical Therapy)  PLACE OF SERVICE:  [facility]  Advanced Directive information Does Patient Have a Medical Advance Directive?: Yes, Type of Advance Directive: Out of facility DNR (pink MOST or yellow form), Pre-existing out of facility DNR order (yellow form or pink MOST form): Pink MOST/Yellow Form most recent copy in chart - Physician notified to receive inpatient order, Does patient want to make changes to medical advance directive?: No - Patient declined  No Known Allergies  Chief Complaint  Patient presents with   New Patient (Initial Visit)    Establish care. Pill bottles present/not present at initial appointment. Patient did not bring pill bottles with him. There all OTC no prescriptions.     HPI: Patient is a 88 y.o. male to establish care.  He is here today with son, daughter- in-law.   Son has tried to keep up with his father and tries to convey that to his medical team.   He lives at [facility].   He sees wound care at [facility] for toe wound- he is currently on bactrim for 10 days.  He is seeing Dr Larina Bras there.  He also has a pressure wound on left heel.  And top 2nd toe on the right  He is taking several supplement for his neuropathy.   He is in a wheelchair today- using walker and walking with PT at facility.   He has protein calorie malnutrition and dehydration. Does not like water. Drinks coffee  throughtout the day.  He feel and has a hx fracture s/p hip arthroplasty which developed pressure ulcer after that.   Has a constant running nose.    Review of Systems:  Review of Systems  Constitutional:  Negative for chills, fever and weight loss.  HENT:  Negative for tinnitus.   Respiratory:  Negative for cough, sputum production and shortness of  breath.   Cardiovascular:  Negative for chest pain, palpitations and leg swelling.  Gastrointestinal:  Negative for abdominal pain, constipation, diarrhea and heartburn.  Genitourinary:  Negative for dysuria, frequency and urgency.  Musculoskeletal:  Negative for back pain, falls, joint pain and myalgias.  Skin: Negative.   Neurological:  Positive for tingling and sensory change. Negative for dizziness and headaches.  Psychiatric/Behavioral:  Negative for depression and memory loss. The patient does not have insomnia.     Past Medical History:  Diagnosis Date   Constipation    COVID 05/27/2021   Gait abnormality 02/09/2020   Neuropathy    Peripheral neuropathy 09/30/2017   SVT (supraventricular tachycardia) [facility])    Past Surgical History:  Procedure Laterality Date   CYST REMOVAL NECK     HIP ARTHROPLASTY Left 12/24/2021   Procedure: ARTHROPLASTY BIPOLAR HIP (HEMIARTHROPLASTY);  Surgeon: Eldred Manges, MD;  Location: [facility];  Service: Orthopedics;  Laterality: Left;   Social History:   reports that he quit smoking about 21 months ago. His smoking use included cigarettes. He has never used smokeless tobacco. He reports current alcohol use of about 3.0 standard drinks of alcohol per week. He reports that he does not use drugs.  No family history on file.  Medications: Patient's Medications  New Prescriptions   No medications on file  Previous Medications   B COMPLEX VITAMINS (B COMPLEX PO)    Take 1 tablet by mouth  daily.   COENZYME Q10 (CO Q-10 PO)    Take 1 capsule by mouth daily.   OMEGA-3 FATTY ACIDS (OMEGA 3 PO)    Take 1 capsule by mouth daily.   SULFAMETHOXAZOLE-TRIMETHOPRIM (BACTRIM DS) 800-160 MG TABLET    Take 1 tablet by mouth 2 (two) times daily. Dose unknown- patient will need to confirm  Modified Medications   No medications on file  Discontinued Medications   DOCUSATE SODIUM (COLACE) 100 MG CAPSULE    Take 1 capsule (100 mg total) by mouth 2 (two) times daily.    FUROSEMIDE (LASIX) 40 MG TABLET    Take 1 tablet (40 mg total) by mouth daily.    Physical Exam:  Vitals:   05/16/22 1341  BP: (!) 172/78  Pulse: 90  Temp: (!) 96.9 F (36.1 C)  SpO2: 90%  Weight: 198 lb 6.4 oz (90 kg)  Height: 6\' 3"  (1.905 m)   Body mass index is 24.8 kg/m. Wt Readings from Last 3 Encounters:  05/16/22 198 lb 6.4 oz (90 kg)  04/21/22 199 lb 1.2 oz (90.3 kg)  03/07/22 199 lb (90.3 kg)    Physical Exam Constitutional:      General: He is not in acute distress.    Appearance: He is well-developed. He is not diaphoretic.  HENT:     Head: Normocephalic and atraumatic.     Right Ear: External ear normal.     Left Ear: External ear normal.     Mouth/Throat:     Pharynx: No oropharyngeal exudate.  Eyes:     Conjunctiva/sclera: Conjunctivae normal.     Pupils: Pupils are equal, round, and reactive to light.  Cardiovascular:     Rate and Rhythm: Normal rate and regular rhythm.     Heart sounds: Normal heart sounds.  Pulmonary:     Effort: Pulmonary effort is normal.     Breath sounds: Normal breath sounds.  Abdominal:     General: Bowel sounds are normal.     Palpations: Abdomen is soft.  Musculoskeletal:        General: No tenderness.     Cervical back: Normal range of motion and neck supple.     Right lower leg: Edema (trace) present.     Left lower leg: Edema (trace) present.  Skin:    General: Skin is warm and dry.     Findings: Erythema and rash present.  Neurological:     Mental Status: He is alert and oriented to person, place, and time.     Motor: Weakness (in wheelchair) present.  Psychiatric:        Mood and Affect: Mood normal.     Labs reviewed: Basic Metabolic Panel: Recent Labs    12/30/21 0457 12/31/21 0454 04/21/22 1817  NA 138 134* 132*  K 4.4 4.3 4.5  CL 97* 96* 94*  CO2 35* 32 30  GLUCOSE 112* 116* 103*  BUN 26* 26* 16  CREATININE 0.85 0.89 0.91  CALCIUM 8.7* 8.6* 8.8*   Liver Function Tests: Recent Labs     09/25/21 1223 04/21/22 1817  AST 26 22  ALT 17 13  ALKPHOS 65 65  BILITOT 0.9 0.8  PROT 7.2 7.6  ALBUMIN 3.8 4.2   No results for input(s): "LIPASE", "AMYLASE" in the last 8760 hours. No results for input(s): "AMMONIA" in the last 8760 hours. CBC: Recent Labs    12/24/21 0350 12/25/21 0448 12/30/21 0457 12/31/21 0454 04/21/22 1817  WBC 14.4*   < > 9.4  9.5 9.6  NEUTROABS 12.8*  --   --   --  7.4  HGB 15.6   < > 14.0 14.5 14.6  HCT 47.7   < > 43.0 43.2 43.3  MCV 97.3   < > 96.4 97.1 93.7  PLT 186   < > 191 205 178   < > = values in this interval not displayed.   Lipid Panel: No results for input(s): "CHOL", "HDL", "LDLCALC", "TRIG", "CHOLHDL", "LDLDIRECT" in the last 8760 hours. TSH: No results for input(s): "TSH" in the last 8760 hours. A1C: No results found for: "HGBA1C"   Assessment/Plan 1. Chronic diastolic HF (heart failure) (HCC) -previously was on lasix but has not needed  -euvolemic at this time Followed by cardiology    2. Chronic obstructive pulmonary disease, unspecified COPD type (HCC) -stable, not currently on any medication for this.   3. Rash and nonspecific skin eruption -to chest and back.  - nystatin ointment (MYCOSTATIN); Apply 1 Application topically 2 (two) times daily.  Dispense: 30 g; Refill: 0 - triamcinolone cream (KENALOG) 0.1 %; Apply 1 Application topically 2 (two) times daily.  Dispense: 30 g; Refill: 0 - would also like referral to Dermatology, given today  4. Elevated blood pressure reading -will monitor at this time,appears to be typically well controlled.   5. Mild protein-calorie malnutrition (HCC) Improved on recent labs, continues to give supplement.   6. Idiopathic peripheral neuropathy -continues OTC  7. Toe ulcer -being followed by wound care.  -currently on bactrim BID    Return in about 4 months (around 09/15/2022) for routine follow up. Sooner if needed  [employer]. Biagio Borg  [facility] [phone]

## 2022-05-20 DIAGNOSIS — Z96642 Presence of left artificial hip joint: Secondary | ICD-10-CM | POA: Diagnosis not present

## 2022-05-20 DIAGNOSIS — M6281 Muscle weakness (generalized): Secondary | ICD-10-CM | POA: Diagnosis not present

## 2022-05-20 DIAGNOSIS — Z9181 History of falling: Secondary | ICD-10-CM | POA: Diagnosis not present

## 2022-05-20 DIAGNOSIS — S72002D Fracture of unspecified part of neck of left femur, subsequent encounter for closed fracture with routine healing: Secondary | ICD-10-CM | POA: Diagnosis not present

## 2022-05-20 DIAGNOSIS — R262 Difficulty in walking, not elsewhere classified: Secondary | ICD-10-CM | POA: Diagnosis not present

## 2022-05-21 ENCOUNTER — Encounter: Payer: Self-pay | Admitting: Nurse Practitioner

## 2022-05-21 DIAGNOSIS — Z9181 History of falling: Secondary | ICD-10-CM | POA: Diagnosis not present

## 2022-05-21 DIAGNOSIS — M6281 Muscle weakness (generalized): Secondary | ICD-10-CM | POA: Diagnosis not present

## 2022-05-22 DIAGNOSIS — Z96642 Presence of left artificial hip joint: Secondary | ICD-10-CM | POA: Diagnosis not present

## 2022-05-22 DIAGNOSIS — R262 Difficulty in walking, not elsewhere classified: Secondary | ICD-10-CM | POA: Diagnosis not present

## 2022-05-22 DIAGNOSIS — Z9181 History of falling: Secondary | ICD-10-CM | POA: Diagnosis not present

## 2022-05-22 DIAGNOSIS — S72002D Fracture of unspecified part of neck of left femur, subsequent encounter for closed fracture with routine healing: Secondary | ICD-10-CM | POA: Diagnosis not present

## 2022-05-22 DIAGNOSIS — M6281 Muscle weakness (generalized): Secondary | ICD-10-CM | POA: Diagnosis not present

## 2022-05-23 ENCOUNTER — Telehealth: Payer: Self-pay | Admitting: Nurse Practitioner

## 2022-05-23 DIAGNOSIS — M6281 Muscle weakness (generalized): Secondary | ICD-10-CM | POA: Diagnosis not present

## 2022-05-23 DIAGNOSIS — S72002D Fracture of unspecified part of neck of left femur, subsequent encounter for closed fracture with routine healing: Secondary | ICD-10-CM | POA: Diagnosis not present

## 2022-05-23 DIAGNOSIS — R262 Difficulty in walking, not elsewhere classified: Secondary | ICD-10-CM | POA: Diagnosis not present

## 2022-05-23 DIAGNOSIS — Z9181 History of falling: Secondary | ICD-10-CM | POA: Diagnosis not present

## 2022-05-23 DIAGNOSIS — Z96642 Presence of left artificial hip joint: Secondary | ICD-10-CM | POA: Diagnosis not present

## 2022-05-23 NOTE — Telephone Encounter (Signed)
The patient's son called: the patient's pulse 47bpm, the patient has been feeling weak in the past a few days, denied chest pain, SOB, or palpitation. Hx of SVT, saw Cardiology 03/07/22.  EKG 04/23/22 2:1 AVB, HR 102, Aflutter. Recommended ED for further evaluation.

## 2022-05-24 ENCOUNTER — Other Ambulatory Visit: Payer: Self-pay

## 2022-05-24 ENCOUNTER — Emergency Department (HOSPITAL_BASED_OUTPATIENT_CLINIC_OR_DEPARTMENT_OTHER)
Admission: EM | Admit: 2022-05-24 | Discharge: 2022-05-24 | Disposition: A | Discharge status: Home / Self Care | Payer: Medicare PPO | Attending: Emergency Medicine | Admitting: Emergency Medicine

## 2022-05-24 ENCOUNTER — Encounter (HOSPITAL_BASED_OUTPATIENT_CLINIC_OR_DEPARTMENT_OTHER): Payer: Self-pay

## 2022-05-24 DIAGNOSIS — J449 Chronic obstructive pulmonary disease, unspecified: Secondary | ICD-10-CM | POA: Insufficient documentation

## 2022-05-24 DIAGNOSIS — Z87891 Personal history of nicotine dependence: Secondary | ICD-10-CM | POA: Insufficient documentation

## 2022-05-24 DIAGNOSIS — I5032 Chronic diastolic (congestive) heart failure: Secondary | ICD-10-CM | POA: Insufficient documentation

## 2022-05-24 DIAGNOSIS — E871 Hypo-osmolality and hyponatremia: Secondary | ICD-10-CM | POA: Insufficient documentation

## 2022-05-24 DIAGNOSIS — R001 Bradycardia, unspecified: Secondary | ICD-10-CM | POA: Diagnosis present

## 2022-05-24 LAB — URINALYSIS, ROUTINE W REFLEX MICROSCOPIC
Bilirubin Urine: NEGATIVE
Glucose, UA: NEGATIVE mg/dL
Hgb urine dipstick: NEGATIVE
Ketones, ur: NEGATIVE mg/dL
Leukocytes,Ua: NEGATIVE
Nitrite: NEGATIVE
Protein, ur: NEGATIVE mg/dL
Specific Gravity, Urine: 1.017 (ref 1.005–1.030)
pH: 6.5 (ref 5.0–8.0)

## 2022-05-24 LAB — CBC WITH DIFFERENTIAL/PLATELET
Abs Immature Granulocytes: 0.04 10*3/uL (ref 0.00–0.07)
Basophils Absolute: 0 10*3/uL (ref 0.0–0.1)
Basophils Relative: 1 %
Eosinophils Absolute: 0.1 10*3/uL (ref 0.0–0.5)
Eosinophils Relative: 1 %
HCT: 42.3 % (ref 39.0–52.0)
Hemoglobin: 14.9 g/dL (ref 13.0–17.0)
Immature Granulocytes: 1 %
Lymphocytes Relative: 12 %
Lymphs Abs: 1 10*3/uL (ref 0.7–4.0)
MCH: 31.8 pg (ref 26.0–34.0)
MCHC: 35.2 g/dL (ref 30.0–36.0)
MCV: 90.2 fL (ref 80.0–100.0)
Monocytes Absolute: 0.7 10*3/uL (ref 0.1–1.0)
Monocytes Relative: 9 %
Neutro Abs: 6.7 10*3/uL (ref 1.7–7.7)
Neutrophils Relative %: 76 %
Platelets: 189 10*3/uL (ref 150–400)
RBC: 4.69 MIL/uL (ref 4.22–5.81)
RDW: 12.7 % (ref 11.5–15.5)
WBC: 8.6 10*3/uL (ref 4.0–10.5)
nRBC: 0 % (ref 0.0–0.2)

## 2022-05-24 LAB — BASIC METABOLIC PANEL
Anion gap: 9 (ref 5–15)
Anion gap: 7 (ref 5–15)
BUN: 20 mg/dL (ref 8–23)
BUN: 18 mg/dL (ref 8–23)
CO2: 26 mmol/L (ref 22–32)
CO2: 28 mmol/L (ref 22–32)
Calcium: 9 mg/dL (ref 8.9–10.3)
Calcium: 8.5 mg/dL — ABNORMAL LOW (ref 8.9–10.3)
Chloride: 90 mmol/L — ABNORMAL LOW (ref 98–111)
Chloride: 91 mmol/L — ABNORMAL LOW (ref 98–111)
Creatinine, Ser: 1.16 mg/dL (ref 0.61–1.24)
Creatinine, Ser: 1.06 mg/dL (ref 0.61–1.24)
GFR, Estimated: 59 mL/min — ABNORMAL LOW (ref >60)
GFR, Estimated: >60 mL/min (ref >60)
Glucose, Bld: 119 mg/dL — ABNORMAL HIGH (ref 70–99)
Glucose, Bld: 87 mg/dL (ref 70–99)
Potassium: 4.4 mmol/L (ref 3.5–5.1)
Potassium: 4.6 mmol/L (ref 3.5–5.1)
Sodium: 125 mmol/L — ABNORMAL LOW (ref 135–145)
Sodium: 126 mmol/L — ABNORMAL LOW (ref 135–145)

## 2022-05-24 LAB — T4, FREE: Free T4: 1.02 ng/dL (ref 0.61–1.12)

## 2022-05-24 LAB — TSH: TSH: 2.119 u[IU]/mL (ref 0.350–4.500)

## 2022-05-24 MED ORDER — SODIUM CHLORIDE 0.9 % IV BOLUS
1000.0000 mL | Freq: Once | INTRAVENOUS | Status: AC
  Administered 2022-05-24: 1000 mL via INTRAVENOUS

## 2022-05-24 NOTE — ED Notes (Signed)
Discharge paperwork given and verbally understood. 

## 2022-05-24 NOTE — ED Notes (Signed)
Recent episodes of weakness in legs while ambulating and trying to sit up from a sitting position. Last night noted HR was brady around 40's, palpated by a RN at the assisted care facility. Today EMS arrived and found no bradycardia (12-lead from them given by family). No current pain, or other aliment. Only concerned about the brady from last night.

## 2022-05-24 NOTE — ED Triage Notes (Addendum)
Pt arrives today with son, c/o weakness (per son) and bradycardia . Pt was told that his HR was in the 40s yesterday by PT, who use a finger pulse ox. HR was mid 70s earlier today with EMS. Pt states he is feeling at baseline.

## 2022-05-24 NOTE — Discharge Instructions (Addendum)
We evaluated you in the emergency department for your low heart rate.  Your monitoring was overall normal without sign of dangerously low heart rate.  Your lab testing was reassuring other than a low sodium of 126.  Please get this rechecked on Monday.  This is likely due to dehydration as it improved when we gave you IV fluids.  Please try to drink drinks like Gatorade or Powerade that have electrolytes in them to help improve your sodium over the weekend.  Please return if you develop any symptoms of low sodium such as tremors, confusion, seizures, weakness, or any other concerning symptoms.

## 2022-05-24 NOTE — ED Provider Notes (Signed)
[facility] EMERGENCY DEPT Provider Note  CSN: [id number] Arrival date & time: 05/24/22 1430  Chief Complaint(s) Bradycardia  HPI Hector Beltran is a 88 y.o. male with history of CHF, OPD, cirrhosis presenting to the emergency department with reported low heart rate.  Patient has been getting physical therapy at home.  Physical therapist reported that the patient had a low heart rate while using a pulse oximeter.  The patient overall denies any symptoms other than mild generalized weakness.  He denies any specific symptoms such as chest pain, abdominal pain, nausea, vomiting, fevers, chills, dysuria, headaches, back pain, numbness, tingling, focal weakness.   Past Medical History Past Medical History:  Diagnosis Date   Constipation    COVID 05/27/2021   Gait abnormality 02/09/2020   Neuropathy    Peripheral neuropathy 09/30/2017   SVT (supraventricular tachycardia) [facility])    Patient Active Problem List   Diagnosis Date Noted   S/P hip hemiarthroplasty 01/21/2022   Closed left hip fracture (HCC) 12/24/2021   Chronic diastolic HF (heart failure) (HCC) 12/24/2021   Fall at home, initial encounter 12/24/2021   Leukocytosis 12/24/2021   Acute CHF (congestive heart failure) (HCC) 08/22/2020   Cirrhosis of liver (HCC) 08/22/2020   COPD (chronic obstructive pulmonary disease) (HCC) 08/22/2020   Nicotine dependence, cigarettes, uncomplicated 08/22/2020   Hyperkalemia 08/22/2020   Hepatic cyst 08/22/2020   Elevated troponin level not due myocardial infarction 08/22/2020   Acute respiratory failure with hypoxia (HCC) 08/22/2020   Gait abnormality 02/09/2020   Norovirus 11/22/2018   Diarrhea of presumed infectious origin 11/21/2018   Peripheral neuropathy 09/30/2017   Home Medication(s) Prior to Admission medications   Medication Sig Start Date End Date Taking? Authorizing Provider  B Complex Vitamins (B COMPLEX PO) Take 1 tablet by mouth daily.    [provider]   Coenzyme Q10 (CO Q-10 PO) Take 1 capsule by mouth daily.    [provider]  nystatin ointment (MYCOSTATIN) Apply 1 Application topically 2 (two) times daily. 05/16/22   Sharon Seller, NP  Omega-3 Fatty Acids (OMEGA 3 PO) Take 1 capsule by mouth daily.    [provider]  sulfamethoxazole-trimethoprim (BACTRIM DS) 800-160 MG tablet Take 1 tablet by mouth 2 (two) times daily. Dose unknown- patient will need to confirm    [provider]  triamcinolone cream (KENALOG) 0.1 % Apply 1 Application topically 2 (two) times daily. 05/16/22   Sharon Seller, NP                                                                                                                                    Past Surgical History Past Surgical History:  Procedure Laterality Date   CYST REMOVAL NECK     HIP ARTHROPLASTY Left 12/24/2021   Procedure: ARTHROPLASTY BIPOLAR HIP (HEMIARTHROPLASTY);  Surgeon: Eldred Manges, MD;  Location: [facility];  Service: Orthopedics;  Laterality: Left;   Family  History History reviewed. No pertinent family history.  Social History Social History   Tobacco Use   Smoking status: Former    Packs/day: 0.25    Years: 50.00    Total pack years: 12.50    Types: Cigarettes    Quit date: 08/15/2020    Years since quitting: 1.7   Smokeless tobacco: Never   Tobacco comments:    2 packs per week, quit 08/2020  Vaping Use   Vaping Use: Never used  Substance Use Topics   Alcohol use: Yes    Alcohol/week: 3.0 standard drinks of alcohol    Types: 1 Glasses of wine, 1 Cans of beer, 1 Shots of liquor per week    Comment: occasionally   Drug use: No   Allergies Patient has no known allergies.  Review of Systems Review of Systems  All other systems reviewed and are negative.   Physical Exam Vital Signs  I have reviewed the triage vital signs BP (!) 156/103   Pulse 87   Temp 98 F (36.7 C)   Resp 17   Ht 6\' 3"  (1.905 m)   Wt 81.6 kg   SpO2 93%   BMI  22.50 kg/m  Physical Exam Vitals and nursing note reviewed.  Constitutional:      General: He is not in acute distress.    Appearance: Normal appearance.  HENT:     Mouth/Throat:     Mouth: Mucous membranes are moist.  Eyes:     Conjunctiva/sclera: Conjunctivae normal.  Cardiovascular:     Rate and Rhythm: Normal rate and regular rhythm.  Pulmonary:     Effort: Pulmonary effort is normal. No respiratory distress.     Breath sounds: Normal breath sounds.  Abdominal:     General: Abdomen is flat.     Palpations: Abdomen is soft.     Tenderness: There is no abdominal tenderness.  Musculoskeletal:     Right lower leg: No edema.     Left lower leg: No edema.  Skin:    General: Skin is warm and dry.     Capillary Refill: Capillary refill takes less than 2 seconds.  Neurological:     Mental Status: He is alert and oriented to person, place, and time. Mental status is at baseline.     Cranial Nerves: No cranial nerve deficit.     Motor: No weakness.  Psychiatric:        Mood and Affect: Mood normal.        Behavior: Behavior normal.     ED Results and Treatments Labs (all labs ordered are listed, but only abnormal results are displayed) Labs Reviewed  BASIC METABOLIC PANEL - Abnormal; Notable for the following components:      Result Value   Sodium 125 (*)    Chloride 90 (*)    Glucose, Bld 119 (*)    GFR, Estimated 59 (*)    All other components within normal limits  BASIC METABOLIC PANEL - Abnormal; Notable for the following components:   Sodium 126 (*)    Chloride 91 (*)    Calcium 8.5 (*)    All other components within normal limits  CBC WITH DIFFERENTIAL/PLATELET  TSH  URINALYSIS, ROUTINE W REFLEX MICROSCOPIC  T4, FREE  Radiology No results found.  Pertinent labs & imaging results that were available during my care of the patient were  reviewed by me and considered in my medical decision making (see MDM for details).  Medications Ordered in ED Medications  sodium chloride 0.9 % bolus 1,000 mL (0 mLs Intravenous Stopped 05/24/22 1919)                                                                                                                                     Procedures .1-3 Lead EKG Interpretation  Performed by: Lonell Grandchild, MD Authorized by: Lonell Grandchild, MD     Interpretation: normal     ECG rate assessment: normal     Rhythm: sinus rhythm     Ectopy: PAC     Conduction: normal     (including critical care time)  Medical Decision Making / ED Course   MDM:  88 year old male presenting to the emergency department with generalized weakness.  Patient well-appearing.  Exam overall nonfocal.  Patient not bradycardic in the emergency department.  The son called EMS earlier in the day, tracings from EMS demonstrate no bradycardia.  Low concern for bradycardia given absence of symptoms and reading only on finger pulse oximeter, without evidence of actual bradycardia via EMS or emergency department care.  We will continue on monitor.  We will obtain basic labs, given reported weakness, as well as urinalysis this patient recently had a urinary infection.  Patient specifically requesting TSH checked so we will check as this is not unreasonable.  Given patient is otherwise asymptomatic with reassuring vital signs, no fevers, if labs reassuring, likely discharge after ambulation trial.  Clinical Course as of 05/24/22 1943  Sat May 24, 2022  1858 Pulse Rate(!): 52 Reviewed telemetry data, appeared to be in normal sinus rhythm throughout stay so far [WS]  1859 Sodium(!): 125 Likely related to dehydration, creatinine mildly elevated.  Gave IV fluids.  We will recheck. [WS]  1940 Sodium(!): 126 Improved on recheck.  Patient overall asymptomatic, no seizures, no tremors, no confusion, on further history  does not really feel weak.  Given improvement with fluids, suspect due to dehydration.  Patient has reportedly been told he is dehydrated in the past.  Advised increasing fluid intake, patient will follow-up with his primary doctor on Monday in order to get a recheck of his sodium.  Other than the single rate of 52, no real evidence of bradycardia in the emergency department. Will discharge patient to home. All questions answered. Patient comfortable with plan of discharge. Return precautions discussed with patient and specified on the after visit summary.  [WS]    Clinical Course User Index [WS] Suezanne Jacquet, Jerilee Field, MD     Additional history obtained: -Additional history obtained from family -External records from outside source obtained and reviewed including: Chart review including previous notes, labs, imaging, consultation notes   Lab Tests: -I ordered,  reviewed, and interpreted labs.   The pertinent results include:   Labs Reviewed  BASIC METABOLIC PANEL - Abnormal; Notable for the following components:      Result Value   Sodium 125 (*)    Chloride 90 (*)    Glucose, Bld 119 (*)    GFR, Estimated 59 (*)    All other components within normal limits  BASIC METABOLIC PANEL - Abnormal; Notable for the following components:   Sodium 126 (*)    Chloride 91 (*)    Calcium 8.5 (*)    All other components within normal limits  CBC WITH DIFFERENTIAL/PLATELET  TSH  URINALYSIS, ROUTINE W REFLEX MICROSCOPIC  T4, FREE      EKG   EKG Interpretation  Date/Time:  Saturday May 24 2022 17:39:53 EDT Ventricular Rate:  87 PR Interval:  209 QRS Duration: 113 QT Interval:  375 QTC Calculation: 452 R Axis:   38 Text Interpretation: Normal sinus rhythm with significant artifact Ventricular premature complex Incomplete right bundle branch block Artifact in lead(s) I II III aVR aVL aVF V1 V2 Confirmed by Alvino Blood ([id number]) on 05/24/2022 7:00:36 PM           Medicines  ordered and prescription drug management: Meds ordered this encounter  Medications   sodium chloride 0.9 % bolus 1,000 mL    -I have reviewed the patients home medicines and have made adjustments as needed   Cardiac Monitoring: The patient was maintained on a cardiac monitor.  I personally viewed and interpreted the cardiac monitored which showed an underlying rhythm of: NSR  Social Determinants of Health:  Factors impacting patients care include: former smoker   Reevaluation: After the interventions noted above, I reevaluated the patient and found that they have stayed the same  Co morbidities that complicate the patient evaluation  Past Medical History:  Diagnosis Date   Constipation    COVID 05/27/2021   Gait abnormality 02/09/2020   Neuropathy    Peripheral neuropathy 09/30/2017   SVT (supraventricular tachycardia) (HCC)       Dispostion: Discharge    Final Clinical Impression(s) / ED Diagnoses Final diagnoses:  Hyponatremia     This chart was dictated using voice recognition software.  Despite best efforts to proofread,  errors can occur which can change the documentation meaning.    Lonell Grandchild, MD 05/24/22 [phone]

## 2022-05-26 DIAGNOSIS — R262 Difficulty in walking, not elsewhere classified: Secondary | ICD-10-CM | POA: Diagnosis not present

## 2022-05-26 DIAGNOSIS — Z85828 Personal history of other malignant neoplasm of skin: Secondary | ICD-10-CM | POA: Diagnosis not present

## 2022-05-26 DIAGNOSIS — M6281 Muscle weakness (generalized): Secondary | ICD-10-CM | POA: Diagnosis not present

## 2022-05-26 DIAGNOSIS — L218 Other seborrheic dermatitis: Secondary | ICD-10-CM | POA: Diagnosis not present

## 2022-05-26 DIAGNOSIS — Z96642 Presence of left artificial hip joint: Secondary | ICD-10-CM | POA: Diagnosis not present

## 2022-05-26 DIAGNOSIS — Z9181 History of falling: Secondary | ICD-10-CM | POA: Diagnosis not present

## 2022-05-26 DIAGNOSIS — S72002D Fracture of unspecified part of neck of left femur, subsequent encounter for closed fracture with routine healing: Secondary | ICD-10-CM | POA: Diagnosis not present

## 2022-05-27 DIAGNOSIS — Z9181 History of falling: Secondary | ICD-10-CM | POA: Diagnosis not present

## 2022-05-27 DIAGNOSIS — R262 Difficulty in walking, not elsewhere classified: Secondary | ICD-10-CM | POA: Diagnosis not present

## 2022-05-27 DIAGNOSIS — Z96642 Presence of left artificial hip joint: Secondary | ICD-10-CM | POA: Diagnosis not present

## 2022-05-27 DIAGNOSIS — S72002D Fracture of unspecified part of neck of left femur, subsequent encounter for closed fracture with routine healing: Secondary | ICD-10-CM | POA: Diagnosis not present

## 2022-05-27 DIAGNOSIS — M6281 Muscle weakness (generalized): Secondary | ICD-10-CM | POA: Diagnosis not present

## 2022-05-28 ENCOUNTER — Encounter (HOSPITAL_BASED_OUTPATIENT_CLINIC_OR_DEPARTMENT_OTHER): Payer: Medicare PPO | Attending: Physician Assistant | Admitting: Physician Assistant

## 2022-05-28 ENCOUNTER — Other Ambulatory Visit: Payer: Self-pay

## 2022-05-28 ENCOUNTER — Emergency Department (HOSPITAL_COMMUNITY)
Admission: EM | Admit: 2022-05-28 | Discharge: 2022-05-29 | Disposition: A | Discharge status: Home / Self Care | Payer: Medicare PPO | Attending: Emergency Medicine | Admitting: Emergency Medicine

## 2022-05-28 ENCOUNTER — Emergency Department (HOSPITAL_COMMUNITY): Payer: Medicare PPO

## 2022-05-28 ENCOUNTER — Encounter (HOSPITAL_COMMUNITY): Payer: Self-pay | Admitting: Pharmacy Technician

## 2022-05-28 DIAGNOSIS — L89894 Pressure ulcer of other site, stage 4: Secondary | ICD-10-CM | POA: Insufficient documentation

## 2022-05-28 DIAGNOSIS — M60076 Infective myositis, right toe(s): Secondary | ICD-10-CM | POA: Insufficient documentation

## 2022-05-28 DIAGNOSIS — E440 Moderate protein-calorie malnutrition: Secondary | ICD-10-CM | POA: Diagnosis not present

## 2022-05-28 DIAGNOSIS — G609 Hereditary and idiopathic neuropathy, unspecified: Secondary | ICD-10-CM | POA: Diagnosis not present

## 2022-05-28 DIAGNOSIS — L89623 Pressure ulcer of left heel, stage 3: Secondary | ICD-10-CM | POA: Diagnosis not present

## 2022-05-28 DIAGNOSIS — Z9181 History of falling: Secondary | ICD-10-CM | POA: Diagnosis not present

## 2022-05-28 DIAGNOSIS — J449 Chronic obstructive pulmonary disease, unspecified: Secondary | ICD-10-CM | POA: Diagnosis not present

## 2022-05-28 DIAGNOSIS — I110 Hypertensive heart disease with heart failure: Secondary | ICD-10-CM | POA: Insufficient documentation

## 2022-05-28 DIAGNOSIS — M6281 Muscle weakness (generalized): Secondary | ICD-10-CM | POA: Diagnosis not present

## 2022-05-28 DIAGNOSIS — I5042 Chronic combined systolic (congestive) and diastolic (congestive) heart failure: Secondary | ICD-10-CM | POA: Insufficient documentation

## 2022-05-28 DIAGNOSIS — L089 Local infection of the skin and subcutaneous tissue, unspecified: Secondary | ICD-10-CM

## 2022-05-28 DIAGNOSIS — I2510 Atherosclerotic heart disease of native coronary artery without angina pectoris: Secondary | ICD-10-CM | POA: Diagnosis not present

## 2022-05-28 DIAGNOSIS — M19071 Primary osteoarthritis, right ankle and foot: Secondary | ICD-10-CM | POA: Diagnosis not present

## 2022-05-28 DIAGNOSIS — Z96642 Presence of left artificial hip joint: Secondary | ICD-10-CM | POA: Insufficient documentation

## 2022-05-28 DIAGNOSIS — L97519 Non-pressure chronic ulcer of other part of right foot with unspecified severity: Secondary | ICD-10-CM | POA: Diagnosis not present

## 2022-05-28 LAB — CBC WITH DIFFERENTIAL/PLATELET
Abs Immature Granulocytes: 0.06 10*3/uL (ref 0.00–0.07)
Basophils Absolute: 0.1 10*3/uL (ref 0.0–0.1)
Basophils Relative: 1 %
Eosinophils Absolute: 0.2 10*3/uL (ref 0.0–0.5)
Eosinophils Relative: 2 %
HCT: 43.8 % (ref 39.0–52.0)
Hemoglobin: 14.8 g/dL (ref 13.0–17.0)
Immature Granulocytes: 1 %
Lymphocytes Relative: 14 %
Lymphs Abs: 1.1 10*3/uL (ref 0.7–4.0)
MCH: 31.6 pg (ref 26.0–34.0)
MCHC: 33.8 g/dL (ref 30.0–36.0)
MCV: 93.6 fL (ref 80.0–100.0)
Monocytes Absolute: 0.7 10*3/uL (ref 0.1–1.0)
Monocytes Relative: 9 %
Neutro Abs: 6.3 10*3/uL (ref 1.7–7.7)
Neutrophils Relative %: 73 %
Platelets: 200 10*3/uL (ref 150–400)
RBC: 4.68 MIL/uL (ref 4.22–5.81)
RDW: 12.7 % (ref 11.5–15.5)
WBC: 8.4 10*3/uL (ref 4.0–10.5)
nRBC: 0 % (ref 0.0–0.2)

## 2022-05-28 LAB — SEDIMENTATION RATE: Sed Rate: 7 mm/hr (ref 0–16)

## 2022-05-28 LAB — BASIC METABOLIC PANEL
Anion gap: 7 (ref 5–15)
BUN: 16 mg/dL (ref 8–23)
CO2: 28 mmol/L (ref 22–32)
Calcium: 8.9 mg/dL (ref 8.9–10.3)
Chloride: 95 mmol/L — ABNORMAL LOW (ref 98–111)
Creatinine, Ser: 0.9 mg/dL (ref 0.61–1.24)
GFR, Estimated: >60 mL/min (ref >60)
Glucose, Bld: 105 mg/dL — ABNORMAL HIGH (ref 70–99)
Potassium: 4.9 mmol/L (ref 3.5–5.1)
Sodium: 130 mmol/L — ABNORMAL LOW (ref 135–145)

## 2022-05-28 LAB — LACTIC ACID, PLASMA: Lactic Acid, Venous: 0.7 mmol/L (ref 0.5–1.9)

## 2022-05-28 MED ORDER — DEXTROSE 5 % IV SOLN
1500.0000 mg | Freq: Once | INTRAVENOUS | Status: AC
  Administered 2022-05-28: 1500 mg via INTRAVENOUS
  Filled 2022-05-28: qty 75

## 2022-05-28 NOTE — Progress Notes (Signed)
PH[facility]ACY - [facility]  Pharmacy Note:  [facility] for Acute Bacterial Skin and Skin Structure Infection (ABSSSI) Patients to [facility] Discharge  Hector Beltran is an 88 y.o. male who presented to [facility] on 05/28/2022 with an Acute Bacterial Skin and Skin Structure Infection  Inclusion criteria - Indication Toe infection/wouond   Patient was evaluated for the following exclusion criteria and no exclusions were found  Hardware involvement, Hypotension / shock, Elevated lactate (>2) without other explanation, gram-negative infection risk factors (bites, water exposure, infection after trauma, infection after skin graft, neutropenia, burns, severe immunocompromise), necrotizing fasciitis possible or confirmed, Known or suspected osteomyelitis or septic arthritis, endocarditis, diabetic foot infection, ischemic ulcers, post-operative wound infection, perirectal infections, need for drainage in the operating room, hand or facial infections, injection drug users with a fever, bacteremia, pregnancy or breastfeeding, allergy to related antibiotics like vancomycin, known liver disease (t.bili >2x ULN or AST/ALT 3x ULN)  Tionna Gigante, Joselyn Glassman, PharmD 05/28/2022, 11:04 PM Clinical Pharmacist

## 2022-05-28 NOTE — Progress Notes (Addendum)
SUFYAAN, BROWDER ([id number]) Visit Report for 05/28/2022 Chief Complaint Document Details Patient Name: Date of Service: Hector Beltran 05/28/2022 2:30 PM Medical Record Number: [id number] Patient Account Number: [id number] Date of Birth/Sex: Treating RN: Jun 08, 1928 (88 y.o. M) Primary Care Provider: Sherrie Mustache Other Clinician: Referring Provider: Treating Provider/Extender: Jennye Moccasin in Treatment: 13 Information Obtained from: Patient Chief Complaint Pressure ulcer left heel and right 2nd toe Electronic Signature(s) Signed: 05/28/2022 3:05:01 PM By: Worthy Keeler PA-C Entered By: Worthy Keeler on 05/28/2022 15:05:01 -------------------------------------------------------------------------------- Debridement Details Patient Name: Date of Service: Hector Beltran, Hector RRY N. 05/28/2022 2:30 PM Medical Record Number: [id number] Patient Account Number: [id number] Date of Birth/Sex: Treating RN: 1927-09-24 (88 y.o. Burnadette Pop, [name] Primary Care Provider: Sherrie Mustache Other Clinician: Referring Provider: Treating Provider/Extender: Jennye Moccasin in Treatment: 13 Debridement Performed for Assessment: Wound #1 Left Calcaneus Performed By: Physician Worthy Keeler, PA Debridement Type: Debridement Level of Consciousness (Pre-procedure): Awake and Alert Pre-procedure Verification/Time Out Yes - 15:35 Taken: Start Time: 15:35 Pain Control: Lidocaine T Area Debrided (L x W): otal 0.5 (cm) x 0.4 (cm) = 0.2 (cm) Tissue and other material debrided: Viable, Non-Viable, Callus, Skin: Dermis , Skin: Epidermis Level: Skin/Epidermis Debridement Description: Selective/Open Wound Instrument: Curette Bleeding: Minimum Hemostasis Achieved: Pressure End Time: 15:35 Procedural Pain: 0 Post Procedural Pain: 0 Response to Treatment: Procedure was tolerated well Level of Consciousness (Post- Awake and Alert procedure): Post Debridement  Measurements of Total Wound Length: (cm) 0.5 Stage: Category/Stage III Width: (cm) 0.4 Depth: (cm) 0.1 Volume: (cm) 0.016 Character of Wound/Ulcer Post Debridement: Improved Post Procedure Diagnosis Same as Pre-procedure Electronic Signature(s) Signed: 05/28/2022 5:46:43 PM By: Worthy Keeler PA-C Signed: 05/29/2022 4:08:15 PM By: Rhae Hammock RN Entered By: Rhae Hammock on 05/28/2022 15:37:32 -------------------------------------------------------------------------------- HPI Details Patient Name: Date of Service: Hector Beltran, Hector RRY N. 05/28/2022 2:30 PM Medical Record Number: [id number] Patient Account Number: [id number] Date of Birth/Sex: Treating RN: [birth year] (88 y.o. M) Primary Care Provider: Sherrie Mustache Other Clinician: Referring Provider: Treating Provider/Extender: Jennye Moccasin in Treatment: 13 History of Present Illness HPI Description: 02-26-2022 upon evaluation today patient presents for initial inspection here in our clinic concerning issues that he has been having with his left heel he has a pressure ulcer that ensued following having fallen and fractured his hip. This actually occurred in April and he had surgery for total hip replacement on the left of December 24, 2021. He was discharged to a SNF at the point of leaving the hospital and it sometime during the hospital stay and SNF stay he developed a pressure ulcer to his left heel. At this point it does not seem to be getting any worse he is at [facility] independent living and they need orders to know what to do. Right now they do think that they can do the dressing changes but he needs to have the supplies and the recommendations on hand. He did have an x-ray performed on 02-13-2022 which was negative for osteomyelitis this is good news right now they just been using a dry dressing and that is good. Patient does have a history of a left hip replacement total, hypertension, coronary  artery disease, COPD, congestive heart failure, and protein calorie malnutrition as noted by low serum albumin and protein. 03-05-2022 upon evaluation today patient appears to be doing well currently in regard to his heel. Unfortunately the dressing that we put on  is the one that came back in with. The facility which was supposed to be changing this for him we got all the supplies for them but they never actually changed the dressing. With that being said this obviously is frustrating for the family who states they are definitely can reach out to the facility regard. They will get that straightened out. 03-12-2022 upon evaluation today patient appears to be doing well with regard to his wound although he still is not getting this changed through the week. He is pretty much staying off only put on 20 gets back. Obviously this is not the way we wanted to proceed with this but nonetheless we had a hard time getting home health out to do what they needed to do. We are going to follow-up with a phone call and try to see what the deal is as far as them receiving our orders. We faxed them but unfortunately they do not seem to get them they say to see if there is a different fax number. 03-19-2022 upon evaluation today patient appears to be doing well currently in regard to his wound. In fact this has some slough buildup but seems to be doing quite well. I do not see any evidence of active infection at this time which is great news. No fevers, chills, nausea, vomiting, or diarrhea. 03-26-2022 upon evaluation today patient's wound is showing signs of improvement. Fortunately there does not appear to be any evidence of active infection locally or systemically at this time which is great news. No fevers, chills, nausea, vomiting, or diarrhea. 04-02-2022 upon evaluation today patient appears to be doing well currently in regard to his wound. He is actually showing signs of improvement which is great news. Fortunately I  do not see any evidence of active infection locally or systemically. There was a lot of dry skin around the edges of the wound once I remove this and actually clean the surface of the wound this is measuring significantly smaller which is great news. I am very pleased in that regard. 04-16-2022 upon evaluation today patient appears to be doing well currently in regard to his wound. He has been tolerating the dressing changes there is been some need for sharp debridement today. Fortunately I do not see any evidence of active infection locally or systemically at this time. 04-30-2022 upon evaluation today patient appears to be doing well with regard to his wound this is actually significantly smaller. The good news is I do believe we are making excellent progress here and I think the wound is looking so much better. 05-14-2022 upon evaluation today patient's wound on the left heel appears to be doing quite well. Fortunately I do not see any evidence of infection at this time. He does have a new wound on his right second toe which does appear to be a pressure injury. 05-28-2022 upon evaluation today patient's wound on the left heel is pretty much all intents and purposes healed I think it will be healed by the next time I see him. With that being said unfortunately he has a new wound on the second toe. He had 1 on the top of the toe last week this week is actually on the bottom of the toe and it actually looks like the dressing was cutting and red at the base of the toe where it articulates to the foot. Nonetheless unfortunately this has caused the wound which is actually on the way down to tendon and bone. It is also  very red and swollen and inflamed it appears likely that this is probably a bone infection. With that being said the patient is not showing signs of sepsis necessarily at this point although he has had a low pulse. I am unsure if that could be somehow affected by his infection. Again I do not  think he is septic or least is not showing traditional signs but nonetheless he is definitely having a pretty significant issue here with his toe that was not present when I saw him 2 weeks ago. Electronic Signature(s) Signed: 05/28/2022 4:03:02 PM By: Worthy Keeler PA-C Entered By: Worthy Keeler on 05/28/2022 16:03:01 -------------------------------------------------------------------------------- Physical Exam Details Patient Name: Date of Service: Hector Beltran, [place] RRY N. 05/28/2022 2:30 PM Medical Record Number: Hector Beltran Patient Account Number: [id number] Date of Birth/Sex: Treating RN: [birth year] (87 y.o. M) Primary Care Provider: Sherrie Mustache Other Clinician: Referring Provider: Treating Provider/Extender: Jennye Moccasin in Treatment: 17 Constitutional Well-nourished and well-hydrated in no acute distress. Respiratory normal breathing without difficulty. Psychiatric this patient is able to make decisions and demonstrates good insight into disease process. Alert and Oriented x 3. pleasant and cooperative. Notes Upon inspection patient's wound on the left heel is pretty much completely healed there is just a little dry skin I removed there is a tiny area open this is really of no concern in fact if that was the only wound he had today would be in great shape. Unfortunately he has a wound on the underside/plantar region of his second toe right at the base where it articulates with the metatarsal. Subsequently this area is broken down and all the way down to bone. The wound is also on the top and to be perfectly honest the toe is very swollen and erythematous I do believe this likely represents an osteomyelitis issue that is probably going to require amputation. We discussed trying to heal this short of amputation but to be honest the patient is wanting to be very active this would require him to pretty much be in the bed or his wheelchair and not up on this at all  during the day when she tells me he just cannot do. Even then I am not sure that we will be able to save the toe. Electronic Signature(s) Signed: 05/28/2022 4:04:04 PM By: Worthy Keeler PA-C Entered By: Worthy Keeler on 05/28/2022 16:04:04 -------------------------------------------------------------------------------- Physician Orders Details Patient Name: Date of Service: Hector Beltran, Hector RRY N. 05/28/2022 2:30 PM Medical Record Number: Hector Beltran Patient Account Number: [id number] Date of Birth/Sex: Treating RN: 1928-08-13 (88 y.o. Burnadette Pop, [name] Primary Care Provider: Sherrie Mustache Other Clinician: Referring Provider: Treating Provider/Extender: Jennye Moccasin in Treatment: [phone] Verbal / Phone Orders: No Diagnosis Coding ICD-10 Coding Code Description [phone] Pressure ulcer of left heel, stage 3 L89.893 Pressure ulcer of other site, stage 3 Z96.642 Presence of left artificial hip joint I10 Essential (primary) hypertension I25.10 Atherosclerotic heart disease of native coronary artery without angina pectoris J44.9 Chronic obstructive pulmonary disease, unspecified I50.42 Chronic combined systolic (congestive) and diastolic (congestive) heart failure E44.0 Moderate protein-calorie malnutrition Follow-up Appointments ppointment in 1 week. - w/ Jeri Cos and Sanjuan Dame # 7 Return A Bathing/ Shower/ Hygiene May shower with protection but do not get wound dressing(s) wet. - May use cast protector bag from [employer], [place] or [facility] on days dressing not being changed. May shower and wash wound with soap and water. - May wash with antibacterial soap when  changing dressing. Edema Control - Lymphedema / SCD / Other Elevate legs to the level of the heart or above for 30 minutes daily and/or when sitting, a frequency of: - throughout the day Avoid standing for long periods of time. Off-Loading Other: - Avoid/Limit pressure to heel. Recommend wearing a sandal that  does not rub heel area. Additional Orders / Instructions Follow Nutritious Diet - High Protein Diet: 100g of Protein daily Other: - May participate in PT if has appropriate footwear. Non Wound Condition pply the following to affected area as directed: - Apply Antifungal/Athletes Foot cream to right foot. A Wound Treatment Wound #1 - Calcaneus Wound Laterality: Left Cleanser: Soap and Water 3 x Per Week/30 Days Discharge Instructions: Wash with Dial Antibacterial Soap Prim Dressing: Hydrofera Blue Ready Foam, 4x5 in (Generic) 3 x Per Week/30 Days ary Discharge Instructions: Apply to wound bed as instructed Secondary Dressing: Woven Gauze Sponges 2x2 in 3 x Per Week/30 Days Discharge Instructions: Apply over primary dressing as directed. Secured With: Insurance underwriter, Sterile 2x75 (in/in) 3 x Per Week/30 Days Discharge Instructions: Secure with stretch gauze as directed. Secured With: 339M Medipore H Soft Cloth Surgical T ape, 4 x 10 (in/yd) (Generic) 3 x Per Week/30 Days Discharge Instructions: Secure with tape as directed. Wound #2 - T Second oe Wound Laterality: Right Cleanser: Soap and Water 1 x Per Day/15 Days Discharge Instructions: Wash with Dial Antibacterial Soap Cleanser: Wound Cleanser (DME) (Generic) 1 x Per Day/15 Days Discharge Instructions: Cleanse the wound with wound cleanser prior to applying a clean dressing using gauze sponges, not tissue or cotton balls. Prim Dressing: Xeroform Occlusive Gauze Dressing, 4x4 in (DME) (Generic) 1 x Per Day/15 Days ary Discharge Instructions: Apply to wound bed as instructed Secondary Dressing: Woven Gauze Sponge, Non-Sterile 4x4 in (DME) (Generic) 1 x Per Day/15 Days Discharge Instructions: Fold and secure under right second toe without wrapping around the toe. Secured With: 339M Medipore H Soft Cloth Surgical T ape, 4 x 10 (in/yd) (DME) (Generic) 1 x Per Day/15 Days Discharge Instructions: Secure with tape as  directed. Wound #3 - T Second oe Wound Laterality: Plantar, Right Cleanser: Soap and Water 1 x Per Day/15 Days Discharge Instructions: Wash with Dial Antibacterial Soap Cleanser: Wound Cleanser (DME) (Generic) 1 x Per Day/15 Days Discharge Instructions: Cleanse the wound with wound cleanser prior to applying a clean dressing using gauze sponges, not tissue or cotton balls. Prim Dressing: Xeroform Occlusive Gauze Dressing, 4x4 in (DME) (Generic) 1 x Per Day/15 Days ary Discharge Instructions: Apply to wound bed as instructed Secondary Dressing: Woven Gauze Sponge, Non-Sterile 4x4 in (DME) (Generic) 1 x Per Day/15 Days Discharge Instructions: Fold and secure under right second toe without wrapping around the toe. Secured With: 339M Medipore H Soft Cloth Surgical T ape, 4 x 10 (in/yd) (DME) (Generic) 1 x Per Day/15 Days Discharge Instructions: Secure with tape as directed. Consults Podiatry - F/u with Dr. Charlsie Merles for right 2nd toe wound with bone/tendon exposed Electronic Signature(s) Signed: 05/28/2022 5:46:43 PM By: Lenda Kelp PA-C Signed: 05/29/2022 4:08:15 PM By: Fonnie Mu RN Entered By: Fonnie Mu on 05/28/2022 15:54:03 -------------------------------------------------------------------------------- Problem List Details Patient Name: Date of Service: Hector Beltran, Hector RRY N. 05/28/2022 2:30 PM Medical Record Number: [id number] Patient Account Number: [id number] Date of Birth/Sex: Treating RN: [birth year] (88 y.o. M) Primary Care Provider: Abbey Chatters Other Clinician: Referring Provider: Treating Provider/Extender: Prudencio Burly in Treatment: 53 Active Problems ICD-10 Encounter Code Description  Active Date MDM Diagnosis L89.623 Pressure ulcer of left heel, stage 3 02/26/2022 No Yes L89.894 Pressure ulcer of other site, stage 4 05/14/2022 No Yes Z96.642 Presence of left artificial hip joint 02/26/2022 No Yes I10 Essential (primary) hypertension  02/26/2022 No Yes I25.10 Atherosclerotic heart disease of native coronary artery without angina pectoris 02/26/2022 No Yes J44.9 Chronic obstructive pulmonary disease, unspecified 02/26/2022 No Yes I50.42 Chronic combined systolic (congestive) and diastolic (congestive) heart failure 02/26/2022 No Yes E44.0 Moderate protein-calorie malnutrition 02/26/2022 No Yes Inactive Problems Resolved Problems Electronic Signature(s) Signed: 05/28/2022 4:07:15 PM By: Worthy Keeler PA-C Previous Signature: 05/28/2022 3:04:55 PM Version By: Worthy Keeler PA-C Entered By: Worthy Keeler on 05/28/2022 16:07:15 -------------------------------------------------------------------------------- Progress Note Details Patient Name: Date of Service: Hector Beltran, Hector RRY N. 05/28/2022 2:30 PM Medical Record Number: Hector Beltran Patient Account Number: [id number] Date of Birth/Sex: Treating RN: 1928/03/15 (88 y.o. M) Primary Care Provider: Sherrie Mustache Other Clinician: Referring Provider: Treating Provider/Extender: Jennye Moccasin in Treatment: 13 Subjective Chief Complaint Information obtained from Patient Pressure ulcer left heel and right 2nd toe History of Present Illness (HPI) 02-26-2022 upon evaluation today patient presents for initial inspection here in our clinic concerning issues that he has been having with his left heel he has a pressure ulcer that ensued following having fallen and fractured his hip. This actually occurred in April and he had surgery for total hip replacement on the left of December 24, 2021. He was discharged to a SNF at the point of leaving the hospital and it sometime during the hospital stay and SNF stay he developed a pressure ulcer to his left heel. At this point it does not seem to be getting any worse he is at [facility] independent living and they need orders to know what to do. Right now they do think that they can do the dressing changes but he needs to have  the supplies and the recommendations on hand. He did have an x-ray performed on 02-13-2022 which was negative for osteomyelitis this is good news right now they just been using a dry dressing and that is good. Patient does have a history of a left hip replacement total, hypertension, coronary artery disease, COPD, congestive heart failure, and protein calorie malnutrition as noted by low serum albumin and protein. 03-05-2022 upon evaluation today patient appears to be doing well currently in regard to his heel. Unfortunately the dressing that we put on is the one that came back in with. The facility which was supposed to be changing this for him we got all the supplies for them but they never actually changed the dressing. With that being said this obviously is frustrating for the family who states they are definitely can reach out to the facility regard. They will get that straightened out. 03-12-2022 upon evaluation today patient appears to be doing well with regard to his wound although he still is not getting this changed through the week. He is pretty much staying off only put on 20 gets back. Obviously this is not the way we wanted to proceed with this but nonetheless we had a hard time getting home health out to do what they needed to do. We are going to follow-up with a phone call and try to see what the deal is as far as them receiving our orders. We faxed them but unfortunately they do not seem to get them they say to see if there is a different fax number. 03-19-2022  upon evaluation today patient appears to be doing well currently in regard to his wound. In fact this has some slough buildup but seems to be doing quite well. I do not see any evidence of active infection at this time which is great news. No fevers, chills, nausea, vomiting, or diarrhea. 03-26-2022 upon evaluation today patient's wound is showing signs of improvement. Fortunately there does not appear to be any evidence of active  infection locally or systemically at this time which is great news. No fevers, chills, nausea, vomiting, or diarrhea. 04-02-2022 upon evaluation today patient appears to be doing well currently in regard to his wound. He is actually showing signs of improvement which is great news. Fortunately I do not see any evidence of active infection locally or systemically. There was a lot of dry skin around the edges of the wound once I remove this and actually clean the surface of the wound this is measuring significantly smaller which is great news. I am very pleased in that regard. 04-16-2022 upon evaluation today patient appears to be doing well currently in regard to his wound. He has been tolerating the dressing changes there is been some need for sharp debridement today. Fortunately I do not see any evidence of active infection locally or systemically at this time. 04-30-2022 upon evaluation today patient appears to be doing well with regard to his wound this is actually significantly smaller. The good news is I do believe we are making excellent progress here and I think the wound is looking so much better. 05-14-2022 upon evaluation today patient's wound on the left heel appears to be doing quite well. Fortunately I do not see any evidence of infection at this time. He does have a new wound on his right second toe which does appear to be a pressure injury. 05-28-2022 upon evaluation today patient's wound on the left heel is pretty much all intents and purposes healed I think it will be healed by the next time I see him. With that being said unfortunately he has a new wound on the second toe. He had 1 on the top of the toe last week this week is actually on the bottom of the toe and it actually looks like the dressing was cutting and red at the base of the toe where it articulates to the foot. Nonetheless unfortunately this has caused the wound which is actually on the way down to tendon and bone. It is also  very red and swollen and inflamed it appears likely that this is probably a bone infection. With that being said the patient is not showing signs of sepsis necessarily at this point although he has had a low pulse. I am unsure if that could be somehow affected by his infection. Again I do not think he is septic or least is not showing traditional signs but nonetheless he is definitely having a pretty significant issue here with his toe that was not present when I saw him 2 weeks ago. Objective Constitutional Well-nourished and well-hydrated in no acute distress. Vitals Time Taken: 3:00 PM, Height: 75 in, Weight: 192 lbs, BMI: 24, Temperature: 97.6 F, Pulse: 87 bpm, Respiratory Rate: 17 breaths/min, Blood Pressure: 148/85 mmHg. Respiratory normal breathing without difficulty. Psychiatric this patient is able to make decisions and demonstrates good insight into disease process. Alert and Oriented x 3. pleasant and cooperative. General Notes: Upon inspection patient's wound on the left heel is pretty much completely healed there is just a little dry skin  I removed there is a tiny area open this is really of no concern in fact if that was the only wound he had today would be in great shape. Unfortunately he has a wound on the underside/plantar region of his second toe right at the base where it articulates with the metatarsal. Subsequently this area is broken down and all the way down to bone. The wound is also on the top and to be perfectly honest the toe is very swollen and erythematous I do believe this likely represents an osteomyelitis issue that is probably going to require amputation. We discussed trying to heal this short of amputation but to be honest the patient is wanting to be very active this would require him to pretty much be in the bed or his wheelchair and not up on this at all during the day when she tells me he just cannot do. Even then I am not sure that we will be able to save  the toe. Integumentary (Hair, Skin) Wound #1 status is Open. Original cause of wound was Pressure Injury. The date acquired was: 01/08/2022. The wound has been in treatment 13 weeks. The wound is located on the Left Calcaneus. The wound measures 0.1cm length x 0.1cm width x 0.1cm depth; 0.008cm^2 area and 0.001cm^3 volume. There is Fat Layer (Subcutaneous Tissue) exposed. There is a medium amount of serosanguineous drainage noted. The wound margin is distinct with the outline attached to the wound base. There is large (67-100%) red, pink granulation within the wound bed. There is a small (1-33%) amount of necrotic tissue within the wound bed. Wound #2 status is Open. Original cause of wound was Blister. The date acquired was: 05/02/2022. The wound has been in treatment 2 weeks. The wound is located on the Right T Second. The wound measures 1cm length x 1cm width x 0.2cm depth; 0.785cm^2 area and 0.157cm^3 volume. There is Fat Layer oe (Subcutaneous Tissue) exposed. There is no tunneling or undermining noted. There is a medium amount of serosanguineous drainage noted. The wound margin is distinct with the outline attached to the wound base. There is medium (34-66%) red, pink granulation within the wound bed. There is a medium (34-66%) amount of necrotic tissue within the wound bed including Adherent Slough. Wound #3 status is Open. Original cause of wound was Gradually Appeared. The date acquired was: 05/28/2022. The wound is located on the Right,Plantar T oe Second. The wound measures 1cm length x 2cm width x 1.5cm depth; 1.571cm^2 area and 2.356cm^3 volume. There is bone, tendon, and Fat Layer (Subcutaneous Tissue) exposed. There is no tunneling or undermining noted. There is a medium amount of serosanguineous drainage noted. Foul odor after cleansing was noted. The wound margin is distinct with the outline attached to the wound base. There is medium (34-66%) red, pink granulation within the wound bed.  There is a medium (34-66%) amount of necrotic tissue within the wound bed including Adherent Slough. Assessment Active Problems ICD-10 Pressure ulcer of left heel, stage 3 Pressure ulcer of other site, stage 4 Presence of left artificial hip joint Essential (primary) hypertension Atherosclerotic heart disease of native coronary artery without angina pectoris Chronic obstructive pulmonary disease, unspecified Chronic combined systolic (congestive) and diastolic (congestive) heart failure Moderate protein-calorie malnutrition Procedures Wound #1 Pre-procedure diagnosis of Wound #1 is a Pressure Ulcer located on the Left Calcaneus . There was a Selective/Open Wound Skin/Epidermis Debridement with a total area of 0.2 sq cm performed by Worthy Keeler, PA. With the following  instrument(s): Curette to remove Viable and Non-Viable tissue/material. Material removed includes Callus, Skin: Dermis, and Skin: Epidermis after achieving pain control using Lidocaine. No specimens were taken. A time out was conducted at 15:35, prior to the start of the procedure. A Minimum amount of bleeding was controlled with Pressure. The procedure was tolerated well with a pain level of 0 throughout and a pain level of 0 following the procedure. Post Debridement Measurements: 0.5cm length x 0.4cm width x 0.1cm depth; 0.016cm^3 volume. Post debridement Stage noted as Category/Stage III. Character of Wound/Ulcer Post Debridement is improved. Post procedure Diagnosis Wound #1: Same as Pre-Procedure Plan Follow-up Appointments: Return Appointment in 1 week. - w/ Jeri Cos and Sanjuan Dame # 7 Bathing/ Shower/ Hygiene: May shower with protection but do not get wound dressing(s) wet. - May use cast protector bag from [employer], [place] or [facility] on days dressing not being changed. May shower and wash wound with soap and water. - May wash with antibacterial soap when changing dressing. Edema Control - Lymphedema / SCD /  Other: Elevate legs to the level of the heart or above for 30 minutes daily and/or when sitting, a frequency of: - throughout the day Avoid standing for long periods of time. Off-Loading: Other: - Avoid/Limit pressure to heel. Recommend wearing a sandal that does not rub heel area. Additional Orders / Instructions: Follow Nutritious Diet - High Protein Diet: 100g of Protein daily Other: - May participate in PT if has appropriate footwear. Non Wound Condition: Apply the following to affected area as directed: - Apply Antifungal/Athletes Foot cream to right foot. Consults ordered were: Podiatry - F/u with Dr. Paulla Dolly for right 2nd toe wound with bone/tendon exposed WOUND #1: - Calcaneus Wound Laterality: Left Cleanser: Soap and Water 3 x Per Week/30 Days Discharge Instructions: Wash with Dial Antibacterial Soap Prim Dressing: Hydrofera Blue Ready Foam, 4x5 in (Generic) 3 x Per Week/30 Days ary Discharge Instructions: Apply to wound bed as instructed Secondary Dressing: Woven Gauze Sponges 2x2 in 3 x Per Week/30 Days Discharge Instructions: Apply over primary dressing as directed. Secured With: Child psychotherapist, Sterile 2x75 (in/in) 3 x Per Week/30 Days Discharge Instructions: Secure with stretch gauze as directed. Secured With: 6M Medipore H Soft Cloth Surgical T ape, 4 x 10 (in/yd) (Generic) 3 x Per Week/30 Days Discharge Instructions: Secure with tape as directed. WOUND #2: - T Second Wound Laterality: Right oe Cleanser: Soap and Water 1 x Per Day/15 Days Discharge Instructions: Wash with Dial Antibacterial Soap Cleanser: Wound Cleanser (DME) (Generic) 1 x Per Day/15 Days Discharge Instructions: Cleanse the wound with wound cleanser prior to applying a clean dressing using gauze sponges, not tissue or cotton balls. Prim Dressing: Xeroform Occlusive Gauze Dressing, 4x4 in (DME) (Generic) 1 x Per Day/15 Days ary Discharge Instructions: Apply to wound bed as  instructed Secondary Dressing: Woven Gauze Sponge, Non-Sterile 4x4 in (DME) (Generic) 1 x Per Day/15 Days Discharge Instructions: Fold and secure under right second toe without wrapping around the toe. Secured With: 6M Medipore H Soft Cloth Surgical T ape, 4 x 10 (in/yd) (DME) (Generic) 1 x Per Day/15 Days Discharge Instructions: Secure with tape as directed. WOUND #3: - T Second Wound Laterality: Plantar, Right oe Cleanser: Soap and Water 1 x Per Day/15 Days Discharge Instructions: Wash with Dial Antibacterial Soap Cleanser: Wound Cleanser (DME) (Generic) 1 x Per Day/15 Days Discharge Instructions: Cleanse the wound with wound cleanser prior to applying a clean dressing using gauze sponges, not tissue or  cotton balls. Prim Dressing: Xeroform Occlusive Gauze Dressing, 4x4 in (DME) (Generic) 1 x Per Day/15 Days ary Discharge Instructions: Apply to wound bed as instructed Secondary Dressing: Woven Gauze Sponge, Non-Sterile 4x4 in (DME) (Generic) 1 x Per Day/15 Days Discharge Instructions: Fold and secure under right second toe without wrapping around the toe. Secured With: 58M Medipore H Soft Cloth Surgical T ape, 4 x 10 (in/yd) (DME) (Generic) 1 x Per Day/15 Days Discharge Instructions: Secure with tape as directed. 1. I would recommend based on what I am seeing currently that the best option is probably going to be to have the patient go ahead forward with evaluation at the ER. I think that they can proceed with any type of intervention as far as lab work and then likely he is going require amputation of this second toe in order to hopefully allow it to heal effectively and quickly. With that being said I do not see any evidence of active infection systemically right now but again his heart rate has been low according to what they tell me. 2. I am can recommend as well that the patient should continue to monitor for any signs of infection. Obviously right now I think the toe is likely infected  to have him on Bactrim over the past week for 10 days in fact when I saw him 2 weeks ago and that seems to have helped some but nonetheless he has his brick broken down area underneath the toe on the plantar aspect near the base where it is all the way down to tendon/bone. This has me concerned and based on our conversation today I think the best option is probably going to be for [place] to go ahead and send him to the ER for further evaluation and treatment. The patient and family voiced understanding. For now we will use Xeroform gauze as a 1 to keep this from drying out and will get a use a gauze up underneath it just a bolster and then wrap around the sides tape and secured very loosely with tape on the top side. This needs to be changed daily but again if the patient is going to the ER based on the fact that he wants to proceed with amputation of the toe I think that this is good to be a moot point in the end. We will see patient back for reevaluation in 1 week here in the clinic. If anything worsens or changes patient will contact our office for additional recommendations. Electronic Signature(s) Signed: 05/28/2022 4:07:39 PM By: Worthy Keeler PA-C Previous Signature: 05/28/2022 4:06:39 PM Version By: Worthy Keeler PA-C Entered By: Worthy Keeler on 05/28/2022 16:07:39 -------------------------------------------------------------------------------- SuperBill Details Patient Name: Date of Service: Hector Beltran, Hector RRY N. 05/28/2022 Medical Record Number: Hector Beltran Patient Account Number: [id number] Date of Birth/Sex: Treating RN: [birth year] (88 y.o. Burnadette Pop, [name] Primary Care Provider: Sherrie Mustache Other Clinician: Referring Provider: Treating Provider/Extender: Jennye Moccasin in Treatment: 13 Diagnosis Coding ICD-10 Codes Code Description [phone] Pressure ulcer of left heel, stage 3 L89.894 Pressure ulcer of other site, stage 4 Z96.642 Presence of left  artificial hip joint I10 Essential (primary) hypertension I25.10 Atherosclerotic heart disease of native coronary artery without angina pectoris J44.9 Chronic obstructive pulmonary disease, unspecified I50.42 Chronic combined systolic (congestive) and diastolic (congestive) heart failure E44.0 Moderate protein-calorie malnutrition Facility Procedures CPT4 Code: [id number] Description: [facility] VISIT-LEV 4 EST PT Modifier: Quantity: 1 CPT4 Code: [id number] Description: [id number] -  DEBRIDE WOUND 1ST 20 SQ CM OR < ICD-10 Diagnosis Description L89.623 Pressure ulcer of left heel, stage 3 Modifier: Quantity: 1 Physician Procedures : CPT4 Code Description Modifier [id number] - WC PHYS LEVEL 5 - EST PT 25 ICD-10 Diagnosis Description L89.623 Pressure ulcer of left heel, stage 3 L89.894 Pressure ulcer of other site, stage 4 Z96.642 Presence of left artificial hip joint I10  Essential (primary) hypertension Quantity: 1 : [id number] - WC PHYS DEBR WO ANESTH 20 SQ CM ICD-10 Diagnosis Description L89.623 Pressure ulcer of left heel, stage 3 Quantity: 1 Electronic Signature(s) Signed: 05/28/2022 5:46:43 PM By: Worthy Keeler PA-C Signed: 05/29/2022 4:08:15 PM By: Rhae Hammock RN Previous Signature: 05/28/2022 4:08:00 PM Version By: Worthy Keeler PA-C Entered By: Rhae Hammock on 05/28/2022 16:17:00

## 2022-05-28 NOTE — ED Provider Notes (Signed)
[facility]-EMERGENCY DEPT Provider Note   CSN: [id number] Arrival date & time: 05/28/22  1639     History  Chief Complaint  Patient presents with   Wound Check    BERNADETTE ARMIJO is a 88 y.o. male.  88 year old male with stage 4 ulcer to right 2nd toe, plantar surface PIP as managed by wound care, went to office today for recheck of his left foot wound (improved) but wound to have concerning plantar wound prompting ER eval tonight. History of neuropathy, does not have diabetes. No known injuries. On bactrim for 10 days for an ulcer on the left foot that improved.    Wound Check       Home Medications Prior to Admission medications   Medication Sig Start Date End Date Taking? Authorizing Provider  B Complex Vitamins (B COMPLEX PO) Take 1 tablet by mouth daily.    [provider]  Coenzyme Q10 (CO Q-10 PO) Take 1 capsule by mouth daily.    [provider]  nystatin ointment (MYCOSTATIN) Apply 1 Application topically 2 (two) times daily. 05/16/22   Sharon Seller, NP  Omega-3 Fatty Acids (OMEGA 3 PO) Take 1 capsule by mouth daily.    [provider]  sulfamethoxazole-trimethoprim (BACTRIM DS) 800-160 MG tablet Take 1 tablet by mouth 2 (two) times daily. Dose unknown- patient will need to confirm    [provider]  triamcinolone cream (KENALOG) 0.1 % Apply 1 Application topically 2 (two) times daily. 05/16/22   Sharon Seller, NP      Allergies    Patient has no known allergies.    Review of Systems   Review of Systems Negative except as per HPI Physical Exam Updated Vital Signs BP (!) 157/140   Pulse 85   Temp 97.7 F (36.5 C)   Resp 20   SpO2 93%  Physical Exam Vitals and nursing note reviewed.  Constitutional:      General: He is not in acute distress.    Appearance: He is well-developed. He is not diaphoretic.  HENT:     Head: Normocephalic and atraumatic.  Cardiovascular:     Pulses: Normal pulses.   Pulmonary:     Effort: Pulmonary effort is normal.  Musculoskeletal:        General: Swelling present. No tenderness.  Skin:    General: Skin is warm and dry.     Findings: Erythema present.  Neurological:     Mental Status: He is alert and oriented to person, place, and time.     Sensory: Sensory deficit present.     Motor: No weakness.  Psychiatric:        Behavior: Behavior normal.            ED Results / Procedures / Treatments   Labs (all labs ordered are listed, but only abnormal results are displayed) Labs Reviewed  BASIC METABOLIC PANEL - Abnormal; Notable for the following components:      Result Value   Sodium 130 (*)    Chloride 95 (*)    Glucose, Bld 105 (*)    All other components within normal limits  CBC WITH DIFFERENTIAL/PLATELET  LACTIC ACID, PLASMA  SEDIMENTATION RATE  C-REACTIVE PROTEIN    EKG None  Radiology DG Foot Complete Right  Result Date: 05/28/2022 CLINICAL DATA:  Ulcer in the skin EXAM: RIGHT FOOT COMPLETE - 3+ VIEW COMPARISON:  None Available. FINDINGS: No recent fracture or dislocation is seen. Osteopenia is seen  in bony structures. No focal lytic lesions are seen. Distal phalanx of second toe is smaller than usual. Degenerative changes are noted in first metatarsophalangeal joint and the interphalangeal joint of the big toe. Small bony spurs are seen in the talonavicular joint. Plantar spur is seen in calcaneus. IMPRESSION: No fracture or dislocation is seen. No focal lytic lesions are seen. Distal phalanx of right second toe is smaller than usual in size. This may be residual from previous trauma or previous surgery or suggest osteomyelitis. If it is clinically necessary to evaluate for osteomyelitis, follow-up MRI may be considered. Other findings as described in the body of the report. Electronically Signed   By: Ernie Avena M.D.   On: 05/28/2022 17:49    Procedures Procedures    Medications Ordered in ED Medications - No  data to display  ED Course/ Medical Decision Making/ A&P                           Medical Decision Making  88 year old male brought in by family with concern for a right second toe infection/wound.  Patient has been going to wound care for a wound on his left foot which has gradually improved with Bactrim.  Wound care noted a ulcer to the dorsum of his right second toe and when they went to check the plantar surface of the toe today, they were concerned for exposed bone and tendon and sent patient to the ER.  Patient denies injury to the foot, states that he is not a diabetic but has neuropathy.  X-ray of the right foot is negative for fracture, dislocation, focal lytic lesions.  Labs are reassuring including normal white count, normal sed rate, lactic acid negative. Case was discussed with Dr. Anitra Lauth, ER attending who has seen the patient and discussed case with Dr. Linna Caprice, orthopedics.  Plan is for patient to follow-up with Ortho tomorrow, family to call in the morning to schedule an appointment to be seen tomorrow.  Will likely need additional vascular work-up.  Plan is for [place] before discharge. Attempted to discuss plan with family however they have since left the bedside to take a family member home.  Patient verbalizes understanding of plan.        Final Clinical Impression(s) / ED Diagnoses Final diagnoses:  Toe infection  Hereditary and idiopathic peripheral neuropathy    Rx / DC Orders ED Discharge Orders     None         Alden Hipp 05/28/22 2204    Gwyneth Sprout, MD 06/05/22 [phone]

## 2022-05-28 NOTE — Discharge Instructions (Addendum)
Call the orthopedics office in the morning for follow up tomorrow in clinic. Tell them we spoke with Dr. Leroy Libman and he said to follow up with Dr. Odis Hollingshead tomorrow. Will likely need further outpatient vascular work up.  Antibiotics today given IV, no further oral antibiotics needed. ID will call to schedule follow up.

## 2022-05-28 NOTE — ED Triage Notes (Signed)
Pt here for stage 4 ulcer to R foot. Sent here from wound center for further evaluation.

## 2022-05-28 NOTE — ED Notes (Signed)
Dr. Anitra Lauth at bedside speaking w/pt and pt's family.

## 2022-05-28 NOTE — ED Provider Triage Note (Signed)
Emergency Medicine Provider Triage Evaluation Note  Hector Beltran , a 88 y.o. male  was evaluated in triage.  Pt complains of foot ulceration. Pt with pressure ulcer to right second toes for the past several weeks now with bone exposure.  Wound specialist recommend coming to the ER for further eval.  Denies fever or increasing pain. Hx of dehydration.  Hx of neuropathy.  Review of Systems  Positive: As above Negative: As above  Physical Exam  BP (!) 145/92 (BP Location: Right Arm)   Pulse 68   Temp 98 F (36.7 C) (Oral)   Resp 12   SpO2 93%  Gen:   Awake, no distress   Resp:  Normal effort  MSK:   Moves extremities without difficulty  Other:    Medical Decision Making  Medically screening exam initiated at 5:16 PM.  Appropriate orders placed.  Hector Beltran was informed that the remainder of the evaluation will be completed by another provider, this initial triage assessment does not replace that evaluation, and the importance of remaining in the ED until their evaluation is complete.     Fayrene Helper, PA-C 05/28/22 1724

## 2022-05-29 ENCOUNTER — Inpatient Hospital Stay: Payer: Medicare PPO | Admitting: Adult Health

## 2022-05-29 ENCOUNTER — Telehealth (HOSPITAL_COMMUNITY): Payer: Self-pay | Admitting: Pharmacy Technician

## 2022-05-29 ENCOUNTER — Other Ambulatory Visit (HOSPITAL_COMMUNITY): Payer: Self-pay

## 2022-05-29 LAB — C-REACTIVE PROTEIN: CRP: 1.6 mg/dL — ABNORMAL HIGH (ref <1.0)

## 2022-05-29 NOTE — Telephone Encounter (Signed)
Pharmacy Patient Advocate Encounter  Insurance verification completed.    The patient is insured through [facility] Part D   The patient is currently admitted and ran test claims for the following: [place].  Copays and coinsurance results were relayed to Inpatient clinical team.

## 2022-05-29 NOTE — Progress Notes (Signed)
Hector Beltran ([id number]) Visit Report for 05/28/2022 Arrival Information Details Patient Name: Date of Service: Hector Beltran, [place] 05/28/2022 2:30 PM Medical Record Number: [id number] Patient Account Number: [id number] Date of Birth/Sex: Treating RN: October 09, 1927 (88 y.o. Burnadette Pop, [name] Primary Care Shanitra Phillippi: Sherrie Mustache Other Clinician: Referring Chika Cichowski: Treating Roniyah Llorens/Extender: Jennye Moccasin in Treatment: 52 Visit Information History Since Last Visit Added or deleted any medications: No Patient Arrived: Wheel Chair Any new allergies or adverse reactions: No Arrival Time: 14:55 Had a fall or experienced change in No Accompanied By: daugher in law and son activities of daily living that may affect Transfer Assistance: Manual risk of falls: Patient Identification Verified: Yes Signs or symptoms of abuse/neglect since last visito No Secondary Verification Process Completed: Yes Hospitalized since last visit: No Patient Requires Transmission-Based Precautions: No Implantable device outside of the clinic excluding No Patient Has Alerts: No cellular tissue based products placed in the center since last visit: Has Dressing in Place as Prescribed: Yes Pain Present Now: No Electronic Signature(s) Signed: 05/29/2022 4:08:15 PM By: Rhae Hammock RN Entered By: Rhae Hammock on 05/28/2022 14:56:01 -------------------------------------------------------------------------------- Clinic Level of Care Assessment Details Patient Name: Date of ServiceLaurence Beltran 05/28/2022 2:30 PM Medical Record Number: [id number] Patient Account Number: [id number] Date of Birth/Sex: Treating RN: 11/09/1927 (88 y.o. Burnadette Pop, [name] Primary Care Falyn Rubel: Sherrie Mustache Other Clinician: Referring Rachael Zapanta: Treating Siah Kannan/Extender: Jennye Moccasin in Treatment: 13 Clinic Level of Care Assessment Items TOOL 3 Quantity  Score X- 1 0 Use when EandM and Procedure is performed on FOLLOW-UP visit ASSESSMENTS - Nursing Assessment / Reassessment X- 1 10 Reassessment of Co-morbidities (includes updates in patient status) X- 1 5 Reassessment of Adherence to Treatment Plan ASSESSMENTS - Wound and Skin Assessment / Reassessment '[]'  - Points for Wound Assessment can only be taken for a new wound of unknown or different etiology and a procedure is 0 NOT performed to that wound '[]'  - 0 Simple Wound Assessment / Reassessment - one wound X- 2 5 Complex Wound Assessment / Reassessment - multiple wounds '[]'  - 0 Dermatologic / Skin Assessment (not related to wound area) ASSESSMENTS - Focused Assessment X- 1 5 Circumferential Edema Measurements - multi extremities '[]'  - 0 Nutritional Assessment / Counseling / Intervention '[]'  - 0 Lower Extremity Assessment (monofilament, tuning fork, pulses) '[]'  - 0 Peripheral Arterial Disease Assessment (using hand held doppler) ASSESSMENTS - Ostomy and/or Continence Assessment and Care '[]'  - 0 Incontinence Assessment and Management '[]'  - 0 Ostomy Care Assessment and Management (repouching, etc.) PROCESS - Coordination of Care '[]'  - Points for Discharge Coordination can only be taken for a new wound of unknown or different etiology and a 0 procedure is NOT performed to that wound '[]'  - 0 Simple Patient / Family Education for ongoing care X- 1 20 Complex (extensive) Patient / Family Education for ongoing care X- 1 10 Staff obtains Programmer, systems, Records, T Results / Process Orders est X- 1 10 Staff telephones HHA, Nursing Homes / Clarify orders / etc '[]'  - 0 Routine Transfer to another Facility (non-emergent condition) '[]'  - 0 Routine Hospital Admission (non-emergent condition) '[]'  - 0 New Admissions / Biomedical engineer / Ordering NPWT Apligraf, etc. , '[]'  - 0 Emergency Hospital Admission (emergent condition) '[]'  - 0 Simple Discharge Coordination X- 1 15 Complex (extensive)  Discharge Coordination PROCESS - Special Needs '[]'  - 0 Pediatric / Minor Patient Management '[]'  - 0 Isolation Patient Management '[]'  -  0 Hearing / Language / Visual special needs '[]'  - 0 Assessment of Community assistance (transportation, D/C planning, etc.) '[]'  - 0 Additional assistance / Altered mentation '[]'  - 0 Support Surface(s) Assessment (bed, cushion, seat, etc.) INTERVENTIONS - Wound Cleansing / Measurement '[]'  - Points for Wound Cleaning / Measurement, Wound Dressing, Specimen Collection and Specimen taken to lab can 0 only be taken for a new wound of unknown or different etiology and a procedure is NOT performed to that wound '[]'  - 0 Simple Wound Cleansing - one wound X- 2 5 Complex Wound Cleansing - multiple wounds X- 1 5 Wound Imaging (photographs - any number of wounds) '[]'  - 0 Wound Tracing (instead of photographs) '[]'  - 0 Simple Wound Measurement - one wound X- 2 5 Complex Wound Measurement - multiple wounds INTERVENTIONS - Wound Dressings '[]'  - 0 Small Wound Dressing one or multiple wounds X- 2 15 Medium Wound Dressing one or multiple wounds '[]'  - 0 Large Wound Dressing one or multiple wounds INTERVENTIONS - Miscellaneous '[]'  - 0 External ear exam '[]'  - 0 Specimen Collection (cultures, biopsies, blood, body fluids, etc.) '[]'  - 0 Specimen(s) / Culture(s) sent or taken to Lab for analysis '[]'  - 0 Patient Transfer (multiple staff / Civil Service fast streamer / Similar devices) '[]'  - 0 Simple Staple / Suture removal (25 or less) '[]'  - 0 Complex Staple / Suture removal (26 or more) '[]'  - 0 Hypo / Hyperglycemic Management (close monitor of Blood Glucose) '[]'  - 0 Ankle / Brachial Index (ABI) - do not check if billed separately X- 1 5 Vital Signs Has the patient been seen at the hospital within the last three years: Yes Total Score: 145 Level Of Care: New/Established - Level 4 Electronic Signature(s) Signed: 05/29/2022 4:08:15 PM By: Rhae Hammock RN Entered By: Rhae Hammock  on 05/28/2022 16:16:45 -------------------------------------------------------------------------------- Encounter Discharge Information Details Patient Name: Date of Service: Hector Beltran, [name] 05/28/2022 2:30 PM Medical Record Number: [id number] Patient Account Number: [id number] Date of Birth/Sex: Treating RN: 1928/07/18 (88 y.o. Burnadette Pop, [name] Primary Care Parag Dorton: Sherrie Mustache Other Clinician: Referring Leotha Westermeyer: Treating Particia Strahm/Extender: Jennye Moccasin in Treatment: 13 Encounter Discharge Information Items Post Procedure Vitals Discharge Condition: Stable Temperature (F): 98.4 Ambulatory Status: Wheelchair Pulse (bpm): 74 Discharge Destination: Home Respiratory Rate (breaths/min): 17 Transportation: Private Auto Blood Pressure (mmHg): 154/87 Accompanied By: daughter in law and son Schedule Follow-up Appointment: Yes Clinical Summary of Care: Patient Declined Electronic Signature(s) Signed: 05/29/2022 4:08:15 PM By: Rhae Hammock RN Entered By: Rhae Hammock on 05/28/2022 15:55:12 -------------------------------------------------------------------------------- Lower Extremity Assessment Details Patient Name: Date of Service: Hector Beltran, [name] 05/28/2022 2:30 PM Medical Record Number: [id number] Patient Account Number: [id number] Date of Birth/Sex: Treating RN: Sep 04, 1928 (88 y.o. Burnadette Pop, [name] Primary Care Anesia Blackwell: Sherrie Mustache Other Clinician: Referring Ryland Tungate: Treating Jesstin Studstill/Extender: Jennye Moccasin in Treatment: 13 Edema Assessment Assessed: Shirlyn Goltz: Yes] Patrice Paradise: No] Edema: [Left: Ye] [Right: s] Calf Left: Right: Point of Measurement: 33 cm From Medial Instep 38 cm Ankle Left: Right: Point of Measurement: 10 cm From Medial Instep 25 cm Vascular Assessment Pulses: Dorsalis Pedis Palpable: [Left:Yes] Posterior Tibial Palpable: [Left:Yes] Electronic Signature(s) Signed: 05/29/2022  4:08:15 PM By: Rhae Hammock RN Entered By: Rhae Hammock on 05/28/2022 15:03:06 -------------------------------------------------------------------------------- Multi-Disciplinary Care Plan Details Patient Name: Date of Service: Hector Beltran, [name] 05/28/2022 2:30 PM Medical Record Number: [id number] Patient Account Number: [id number] Date of Birth/Sex: Treating RN: 01-20-1928 (88 y.o. Burnadette Pop, [name] Primary  Care Achsah Mcquade: Sherrie Mustache Other Clinician: Referring Lige Lakeman: Treating Simranjit Thayer/Extender: Jennye Moccasin in Treatment: 13 Active Inactive Wound/Skin Impairment Nursing Diagnoses: Impaired tissue integrity Goals: Patient/caregiver will verbalize understanding of skin care regimen Date Initiated: 02/26/2022 Target Resolution Date: 06/04/2022 Goal Status: Active Ulcer/skin breakdown will have a volume reduction of 30% by week 4 Date Initiated: 02/26/2022 Date Inactivated: 04/30/2022 Target Resolution Date: 04/30/2022 Goal Status: Met Ulcer/skin breakdown will have a volume reduction of 50% by week 8 Date Initiated: 04/30/2022 Target Resolution Date: 06/04/2022 Goal Status: Active Interventions: Assess patient/caregiver ability to obtain necessary supplies Assess patient/caregiver ability to perform ulcer/skin care regimen upon admission and as needed Assess ulceration(s) every visit Provide education on ulcer and skin care Treatment Activities: Topical wound management initiated : 02/26/2022 Notes: 04/02/22: Wound care regimen continues. Electronic Signature(s) Signed: 05/29/2022 4:08:15 PM By: Rhae Hammock RN Entered By: Rhae Hammock on 05/28/2022 15:19:20 -------------------------------------------------------------------------------- Pain Assessment Details Patient Name: Date of Service: Hector Beltran, [name] 05/28/2022 2:30 PM Medical Record Number: [id number] Patient Account Number: [id number] Date of Birth/Sex: Treating  RN: [birth year] (88 y.o. Burnadette Pop, [name] Primary Care Areyana Leoni: Sherrie Mustache Other Clinician: Referring Storey Stangeland: Treating Terran Klinke/Extender: Jennye Moccasin in Treatment: 13 Active Problems Location of Pain Severity and Description of Pain Patient Has Paino No Site Locations Pain Management and Medication Current Pain Management: Electronic Signature(s) Signed: 05/29/2022 4:08:15 PM By: Rhae Hammock RN Entered By: Rhae Hammock on 05/28/2022 15:02:45 -------------------------------------------------------------------------------- Patient/Caregiver Education Details Patient Name: Date of Service: Hector Beltran, [name] 9/13/2023andnbsp2:30 PM Medical Record Number: [id number] Patient Account Number: [id number] Date of Birth/Gender: Treating RN: 1928-01-30 (88 y.o. Erie Noe Primary Care Physician: Sherrie Mustache Other Clinician: Referring Physician: Treating Physician/Extender: Jennye Moccasin in Treatment: 13 Education Assessment Education Provided To: Patient and Caregiver Education Topics Provided Wound/Skin Impairment: Methods: Explain/Verbal Responses: Reinforcements needed, State content correctly [employer]) Signed: 05/29/2022 4:08:15 PM By: Rhae Hammock RN Entered By: Rhae Hammock on 05/28/2022 15:19:31 -------------------------------------------------------------------------------- Wound Assessment Details Patient Name: Date of Service: Hector Beltran, [name] 05/28/2022 2:30 PM Medical Record Number: [id number] Patient Account Number: [id number] Date of Birth/Sex: Treating RN: July 21, 1928 (88 y.o. Burnadette Pop, [name] Primary Care Qadir Folks: Sherrie Mustache Other Clinician: Referring Breyonna Nault: Treating Geri Hepler/Extender: Jennye Moccasin in Treatment: 13 Wound Status Wound Number: 1 Primary Pressure Ulcer Etiology: Wound Location: Left Calcaneus Wound  Open Wounding Event: Pressure Injury Status: Date Acquired: 01/08/2022 Comorbid Chronic Obstructive Pulmonary Disease (COPD), Congestive Weeks Of Treatment: 13 History: Heart Failure, Coronary Artery Disease, Hypertension, Cirrhosis , Clustered Wound: No Neuropathy Photos Wound Measurements Length: (cm) 0.1 Width: (cm) 0.1 Depth: (cm) 0.1 Area: (cm) 0.008 Volume: (cm) 0.001 % Reduction in Area: 99.9% % Reduction in Volume: 99.9% Epithelialization: Large (67-100%) Wound Description Classification: Category/Stage III Wound Margin: Distinct, outline attached Exudate Amount: Medium Exudate Type: Serosanguineous Exudate Color: red, brown Foul Odor After Cleansing: No Slough/Fibrino Yes Wound Bed Granulation Amount: Large (67-100%) Exposed Structure Granulation Quality: Red, Pink Fascia Exposed: No Necrotic Amount: Small (1-33%) Fat Layer (Subcutaneous Tissue) Exposed: Yes Tendon Exposed: No Muscle Exposed: No Joint Exposed: No Bone Exposed: No Treatment Notes Wound #1 (Calcaneus) Wound Laterality: Left Cleanser Soap and Water Discharge Instruction: Wash with Dial Antibacterial Soap Peri-Wound Care Topical Primary Dressing Hydrofera Blue Ready Foam, 4x5 in Discharge Instruction: Apply to wound bed as instructed Secondary Dressing Woven Gauze Sponges 2x2 in Discharge Instruction: Apply over primary dressing as directed. Secured  With Conforming Stretch Gauze Bandage, Sterile 2x75 (in/in) Discharge Instruction: Secure with stretch gauze as directed. 108M Medipore H Soft Cloth Surgical T ape, 4 x 10 (in/yd) Discharge Instruction: Secure with tape as directed. Compression Wrap Compression Stockings Add-Ons Electronic Signature(s) Signed: 05/29/2022 4:08:15 PM By: Rhae Hammock RN Entered By: Rhae Hammock on 05/28/2022 15:34:49 -------------------------------------------------------------------------------- Wound Assessment Details Patient Name: Date of  Service: Hector Beltran, [name] 05/28/2022 2:30 PM Medical Record Number: [id number] Patient Account Number: [id number] Date of Birth/Sex: Treating RN: 05-11-1928 (88 y.o. Burnadette Pop, [name] Primary Care Tashika Goodin: Sherrie Mustache Other Clinician: Referring Julya Alioto: Treating Brayln Duque/Extender: Jennye Moccasin in Treatment: 13 Wound Status Wound Number: 2 Primary Pressure Ulcer Etiology: Wound Location: Right T Second oe Wound Open Wounding Event: Blister Status: Date Acquired: 05/02/2022 Comorbid Chronic Obstructive Pulmonary Disease (COPD), Congestive Weeks Of Treatment: 2 History: Heart Failure, Coronary Artery Disease, Hypertension, Cirrhosis , Clustered Wound: No Neuropathy Photos Wound Measurements Length: (cm) 1 Width: (cm) 1 Depth: (cm) 0.2 Area: (cm) 0.785 Volume: (cm) 0.157 % Reduction in Area: 25.9% % Reduction in Volume: 25.9% Epithelialization: None Tunneling: No Undermining: No Wound Description Classification: Category/Stage III Wound Margin: Distinct, outline attached Exudate Amount: Medium Exudate Type: Serosanguineous Exudate Color: red, brown Foul Odor After Cleansing: No Slough/Fibrino Yes Wound Bed Granulation Amount: Medium (34-66%) Exposed Structure Granulation Quality: Red, Pink Fascia Exposed: No Necrotic Amount: Medium (34-66%) Fat Layer (Subcutaneous Tissue) Exposed: Yes Necrotic Quality: Adherent Slough Tendon Exposed: No Muscle Exposed: No Joint Exposed: No Bone Exposed: No Treatment Notes Wound #2 (Toe Second) Wound Laterality: Right Cleanser Soap and Water Discharge Instruction: Wash with Dial Antibacterial Soap Wound Cleanser Discharge Instruction: Cleanse the wound with wound cleanser prior to applying a clean dressing using gauze sponges, not tissue or cotton balls. Peri-Wound Care Topical Primary Dressing Xeroform Occlusive Gauze Dressing, 4x4 in Discharge Instruction: Apply to wound bed as  instructed Secondary Dressing Woven Gauze Sponge, Non-Sterile 4x4 in Discharge Instruction: Fold and secure under right second toe without wrapping around the toe. Secured With 108M Medipore H Soft Cloth Surgical T ape, 4 x 10 (in/yd) Discharge Instruction: Secure with tape as directed. Compression Wrap Compression Stockings Add-Ons Electronic Signature(s) Signed: 05/29/2022 4:08:15 PM By: Rhae Hammock RN Entered By: Rhae Hammock on 05/28/2022 15:15:05 -------------------------------------------------------------------------------- Wound Assessment Details Patient Name: Date of Service: Hector Beltran, [name] 05/28/2022 2:30 PM Medical Record Number: [id number] Patient Account Number: [id number] Date of Birth/Sex: Treating RN: 1928-03-09 (88 y.o. Burnadette Pop, [name] Primary Care Sadi Arave: Sherrie Mustache Other Clinician: Referring Shareta Fishbaugh: Treating Xavyer Steenson/Extender: Jennye Moccasin in Treatment: 13 Wound Status Wound Number: 3 Primary Pressure Ulcer Etiology: Wound Location: Right, Plantar T Second oe Wound Open Wounding Event: Gradually Appeared Status: Date Acquired: 05/28/2022 Comorbid Chronic Obstructive Pulmonary Disease (COPD), Congestive Weeks Of Treatment: 0 History: Heart Failure, Coronary Artery Disease, Hypertension, Cirrhosis , Clustered Wound: No Neuropathy Photos Wound Measurements Length: (cm) 1 Width: (cm) 2 Depth: (cm) 1.5 Area: (cm) 1.571 Volume: (cm) 2.356 % Reduction in Area: 0% % Reduction in Volume: 0% Epithelialization: None Tunneling: No Undermining: No Wound Description Classification: Category/Stage IV Wound Margin: Distinct, outline attached Exudate Amount: Medium Exudate Type: Serosanguineous Exudate Color: red, brown Foul Odor After Cleansing: Yes Due to Product Use: No Slough/Fibrino Yes Wound Bed Granulation Amount: Medium (34-66%) Exposed Structure Granulation Quality: Red, Pink Fascia Exposed:  No Necrotic Amount: Medium (34-66%) Fat Layer (Subcutaneous Tissue) Exposed: Yes Necrotic Quality: Adherent Slough Tendon Exposed: Yes Muscle Exposed:  No Joint Exposed: No Bone Exposed: Yes Treatment Notes Wound #3 (Toe Second) Wound Laterality: Plantar, Right Cleanser Soap and Water Discharge Instruction: Wash with Dial Antibacterial Soap Wound Cleanser Discharge Instruction: Cleanse the wound with wound cleanser prior to applying a clean dressing using gauze sponges, not tissue or cotton balls. Peri-Wound Care Topical Primary Dressing Xeroform Occlusive Gauze Dressing, 4x4 in Discharge Instruction: Apply to wound bed as instructed Secondary Dressing Woven Gauze Sponge, Non-Sterile 4x4 in Discharge Instruction: Fold and secure under right second toe without wrapping around the toe. Secured With 52M Medipore H Soft Cloth Surgical T ape, 4 x 10 (in/yd) Discharge Instruction: Secure with tape as directed. Compression Wrap Compression Stockings Add-Ons Electronic Signature(s) Signed: 05/29/2022 4:08:15 PM By: Rhae Hammock RN Entered By: Rhae Hammock on 05/28/2022 15:51:34 -------------------------------------------------------------------------------- Vitals Details Patient Name: Date of Service: Hector Beltran, [name] 05/28/2022 2:30 PM Medical Record Number: [id number] Patient Account Number: [id number] Date of Birth/Sex: Treating RN: 1927-11-22 (88 y.o. Burnadette Pop, [name] Primary Care Desiraye Rolfson: Sherrie Mustache Other Clinician: Referring Mayra Jolliffe: Treating Saudia Smyser/Extender: Jennye Moccasin in Treatment: 13 Vital Signs Time Taken: 15:00 Temperature (F): 97.6 Height (in): 75 Pulse (bpm): 87 Weight (lbs): 192 Respiratory Rate (breaths/min): 17 Body Mass Index (BMI): 24 Blood Pressure (mmHg): 148/85 Reference Range: 80 - 120 mg / dl Electronic Signature(s) Signed: 05/29/2022 4:08:15 PM By: Rhae Hammock RN Entered By: Rhae Hammock on 05/28/2022 15:02:32

## 2022-05-30 DIAGNOSIS — M2041 Other hammer toe(s) (acquired), right foot: Secondary | ICD-10-CM | POA: Diagnosis not present

## 2022-05-30 DIAGNOSIS — M869 Osteomyelitis, unspecified: Secondary | ICD-10-CM | POA: Diagnosis not present

## 2022-06-02 ENCOUNTER — Encounter (HOSPITAL_BASED_OUTPATIENT_CLINIC_OR_DEPARTMENT_OTHER): Payer: Medicare PPO | Admitting: Internal Medicine

## 2022-06-02 DIAGNOSIS — J449 Chronic obstructive pulmonary disease, unspecified: Secondary | ICD-10-CM | POA: Diagnosis not present

## 2022-06-02 DIAGNOSIS — L89894 Pressure ulcer of other site, stage 4: Secondary | ICD-10-CM | POA: Diagnosis not present

## 2022-06-02 DIAGNOSIS — I110 Hypertensive heart disease with heart failure: Secondary | ICD-10-CM | POA: Diagnosis not present

## 2022-06-02 DIAGNOSIS — E440 Moderate protein-calorie malnutrition: Secondary | ICD-10-CM | POA: Diagnosis not present

## 2022-06-02 DIAGNOSIS — I5042 Chronic combined systolic (congestive) and diastolic (congestive) heart failure: Secondary | ICD-10-CM | POA: Diagnosis not present

## 2022-06-02 DIAGNOSIS — I2510 Atherosclerotic heart disease of native coronary artery without angina pectoris: Secondary | ICD-10-CM | POA: Diagnosis not present

## 2022-06-02 DIAGNOSIS — Z96642 Presence of left artificial hip joint: Secondary | ICD-10-CM | POA: Diagnosis not present

## 2022-06-02 DIAGNOSIS — L89623 Pressure ulcer of left heel, stage 3: Secondary | ICD-10-CM | POA: Diagnosis not present

## 2022-06-02 LAB — AEROBIC/ANAEROBIC CULTURE W GRAM STAIN (SURGICAL/DEEP WOUND)

## 2022-06-02 NOTE — Progress Notes (Signed)
Hector, Beltran ([id number]) Visit Report for 06/02/2022 Arrival Information Details Patient Name: Date of Service: Hector Beltran, [place] 06/02/2022 8:00 A M Medical Record Number: [id number] Patient Account Number: [id number] Date of Birth/Sex: Treating RN: 07-Jan-1928 (88 y.o. Hector Beltran, Meta.Reding Primary Care Peyson Postema: Sherrie Mustache Other Clinician: Referring Hendricks Schwandt: Treating Leone Mobley/Extender: Rolanda Lundborg in Treatment: 70 Visit Information History Since Last Visit Added or deleted any medications: No Patient Arrived: Wheel Chair Any new allergies or adverse reactions: No Arrival Time: 08:20 Had a fall or experienced change in No Accompanied By: self activities of daily living that may affect Transfer Assistance: None risk of falls: Patient Identification Verified: Yes Signs or symptoms of abuse/neglect since last visito No Secondary Verification Process Completed: Yes Hospitalized since last visit: No Patient Requires Transmission-Based Precautions: No Implantable device outside of the clinic excluding No Patient Has Alerts: No cellular tissue based products placed in the center since last visit: Has Dressing in Place as Prescribed: Yes Pain Present Now: No Notes patient here only for right second toe dressing change. Electronic Signature(s) Signed: 06/02/2022 10:43:39 AM By: Deon Pilling RN, BSN Entered By: Deon Pilling on 06/02/2022 08:35:37 -------------------------------------------------------------------------------- Clinic Level of Care Assessment Details Patient Name: Date of Service: Hector Beltran, [place] N. 06/02/2022 8:00 A M Medical Record Number: [id number] Patient Account Number: [id number] Date of Birth/Sex: Treating RN: [birth year] (88 y.o. Hector Beltran Primary Care Esli Clements: Sherrie Mustache Other Clinician: Referring Tien Spooner: Treating Wilhelmena Zea/Extender: Rolanda Lundborg in Treatment: 13 Clinic Level of Care  Assessment Items TOOL 4 Quantity Score X- 1 0 Use when only an EandM is performed on FOLLOW-UP visit ASSESSMENTS - Nursing Assessment / Reassessment []  - 0 Reassessment of Co-morbidities (includes updates in patient status) []  - 0 Reassessment of Adherence to Treatment Plan ASSESSMENTS - Wound and Skin A ssessment / Reassessment X - Simple Wound Assessment / Reassessment - one wound 1 5 []  - 0 Complex Wound Assessment / Reassessment - multiple wounds []  - 0 Dermatologic / Skin Assessment (not related to wound area) ASSESSMENTS - Focused Assessment []  - 0 Circumferential Edema Measurements - multi extremities []  - 0 Nutritional Assessment / Counseling / Intervention []  - 0 Lower Extremity Assessment (monofilament, tuning fork, pulses) []  - 0 Peripheral Arterial Disease Assessment (using hand held doppler) ASSESSMENTS - Ostomy and/or Continence Assessment and Care []  - 0 Incontinence Assessment and Management []  - 0 Ostomy Care Assessment and Management (repouching, etc.) PROCESS - Coordination of Care X - Simple Patient / Family Education for ongoing care 1 15 []  - 0 Complex (extensive) Patient / Family Education for ongoing care X- 1 10 Staff obtains Programmer, systems, Records, T Results / Process Orders est []  - 0 Staff telephones HHA, Nursing Homes / Clarify orders / etc []  - 0 Routine Transfer to another Facility (non-emergent condition) []  - 0 Routine Hospital Admission (non-emergent condition) []  - 0 New Admissions / Biomedical engineer / Ordering NPWT Apligraf, etc. , []  - 0 Emergency Hospital Admission (emergent condition) X- 1 10 Simple Discharge Coordination []  - 0 Complex (extensive) Discharge Coordination PROCESS - Special Needs []  - 0 Pediatric / Minor Patient Management []  - 0 Isolation Patient Management []  - 0 Hearing / Language / Visual special needs []  - 0 Assessment of Community assistance (transportation, D/C planning, etc.) []  -  0 Additional assistance / Altered mentation []  - 0 Support Surface(s) Assessment (bed, cushion, seat, etc.) INTERVENTIONS - Wound Cleansing / Measurement X - Simple Wound  Cleansing - one wound 1 5 []  - 0 Complex Wound Cleansing - multiple wounds X- 1 5 Wound Imaging (photographs - any number of wounds) []  - 0 Wound Tracing (instead of photographs) X- 1 5 Simple Wound Measurement - one wound []  - 0 Complex Wound Measurement - multiple wounds INTERVENTIONS - Wound Dressings X - Small Wound Dressing one or multiple wounds 1 10 []  - 0 Medium Wound Dressing one or multiple wounds []  - 0 Large Wound Dressing one or multiple wounds []  - 0 Application of Medications - topical []  - 0 Application of Medications - injection INTERVENTIONS - Miscellaneous []  - 0 External ear exam []  - 0 Specimen Collection (cultures, biopsies, blood, body fluids, etc.) []  - 0 Specimen(s) / Culture(s) sent or taken to Lab for analysis []  - 0 Patient Transfer (multiple staff / Civil Service fast streamer / Similar devices) []  - 0 Simple Staple / Suture removal (25 or less) []  - 0 Complex Staple / Suture removal (26 or more) []  - 0 Hypo / Hyperglycemic Management (close monitor of Blood Glucose) []  - 0 Ankle / Brachial Index (ABI) - do not check if billed separately []  - 0 Vital Signs Has the patient been seen at the hospital within the last three years: Yes Total Score: 65 Level Of Care: New/Established - Level 2 Electronic Signature(s) Signed: 06/02/2022 10:43:39 AM By: Deon Pilling RN, BSN Entered By: Deon Pilling on 06/02/2022 08:38:20 -------------------------------------------------------------------------------- Encounter Discharge Information Details Patient Name: Date of Service: Hector Beltran, [name] 06/02/2022 8:00 A M Medical Record Number: [id number] Patient Account Number: [id number] Date of Birth/Sex: Treating RN: 10-03-1927 (88 y.o. Hector Beltran Primary Care Dellamae Rosamilia: Sherrie Mustache Other  Clinician: Referring Lisel Siegrist: Treating Ranee Peasley/Extender: Rolanda Lundborg in Treatment: 13 Encounter Discharge Information Items Discharge Condition: Stable Ambulatory Status: Wheelchair Discharge Destination: Home Transportation: Private Auto Accompanied By: self Schedule Follow-up Appointment: Yes Clinical Summary of Care: Electronic Signature(s) Signed: 06/02/2022 10:43:39 AM By: Deon Pilling RN, BSN Entered By: Deon Pilling on 06/02/2022 08:37:01 -------------------------------------------------------------------------------- Wound Assessment Details Patient Name: Date of Service: Hector Beltran, [name] 06/02/2022 8:00 A M Medical Record Number: [id number] Patient Account Number: [id number] Date of Birth/Sex: Treating RN: 09/08/1928 (88 y.o. Hector Beltran Primary Care Cherese Lozano: Sherrie Mustache Other Clinician: Referring Crissy Mccreadie: Treating Jahfari Ambers/Extender: Rolanda Lundborg in Treatment: 13 Wound Status Wound Number: 2 Primary Etiology: Pressure Ulcer Wound Location: Right T Second oe Wound Status: Open Wounding Event: Blister Date Acquired: 05/02/2022 Weeks Of Treatment: 2 Clustered Wound: No Wound Measurements Length: (cm) 1 Width: (cm) 1 Depth: (cm) 0.2 Area: (cm) 0.785 Volume: (cm) 0.157 % Reduction in Area: 25.9% % Reduction in Volume: 25.9% Wound Description Classification: Category/Stage III Exudate Amount: Medium Exudate Type: Serosanguineous Exudate Color: red, brown Treatment Notes Wound #2 (Toe Second) Wound Laterality: Right Cleanser Wound Cleanser Discharge Instruction: Cleanse the wound with wound cleanser prior to applying a clean dressing using gauze sponges, not tissue or cotton balls. Peri-Wound Care Topical Primary Dressing Xeroform Occlusive Gauze Dressing, 4x4 in Discharge Instruction: Apply to wound bed as instructed Secondary Dressing Woven Gauze Sponge, Non-Sterile 4x4 in Discharge  Instruction: Apply over primary dressing as directed. Secured With 2M Medipore H Soft Cloth Surgical T ape, 4 x 10 (in/yd) Discharge Instruction: Secure with tape as directed. Compression Wrap Compression Stockings Add-Ons Electronic Signature(s) Signed: 06/02/2022 10:43:39 AM By: Deon Pilling RN, BSN Entered By: Deon Pilling on 06/02/2022 08:36:05

## 2022-06-02 NOTE — Progress Notes (Signed)
CHARES, SLAYMAKER ([id number]) Visit Report for 06/02/2022 SuperBill Details Patient Name: Date of Service: Hector Beltran, Hector Beltran 06/02/2022 Medical Record Number: [id number] Patient Account Number: [id number] Date of Birth/Sex: Treating RN: Apr 10, 1928 (88 y.o. 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

## 2022-06-04 ENCOUNTER — Telehealth: Payer: Self-pay

## 2022-06-04 ENCOUNTER — Ambulatory Visit: Payer: Medicare PPO | Admitting: Infectious Disease

## 2022-06-04 ENCOUNTER — Encounter: Payer: Self-pay | Admitting: Infectious Disease

## 2022-06-04 ENCOUNTER — Encounter (HOSPITAL_BASED_OUTPATIENT_CLINIC_OR_DEPARTMENT_OTHER): Payer: Medicare PPO | Admitting: Physician Assistant

## 2022-06-04 ENCOUNTER — Other Ambulatory Visit: Payer: Self-pay

## 2022-06-04 VITALS — BP 129/77 | HR 71 | Temp 97.5°F | Ht 75.0 in | Wt 189.0 lb

## 2022-06-04 DIAGNOSIS — L03115 Cellulitis of right lower limb: Secondary | ICD-10-CM | POA: Diagnosis not present

## 2022-06-04 DIAGNOSIS — E440 Moderate protein-calorie malnutrition: Secondary | ICD-10-CM | POA: Diagnosis not present

## 2022-06-04 DIAGNOSIS — M869 Osteomyelitis, unspecified: Secondary | ICD-10-CM

## 2022-06-04 DIAGNOSIS — L89623 Pressure ulcer of left heel, stage 3: Secondary | ICD-10-CM | POA: Diagnosis not present

## 2022-06-04 DIAGNOSIS — M86171 Other acute osteomyelitis, right ankle and foot: Secondary | ICD-10-CM | POA: Diagnosis not present

## 2022-06-04 DIAGNOSIS — L89894 Pressure ulcer of other site, stage 4: Secondary | ICD-10-CM | POA: Diagnosis not present

## 2022-06-04 DIAGNOSIS — J449 Chronic obstructive pulmonary disease, unspecified: Secondary | ICD-10-CM | POA: Diagnosis not present

## 2022-06-04 DIAGNOSIS — G609 Hereditary and idiopathic neuropathy, unspecified: Secondary | ICD-10-CM | POA: Diagnosis not present

## 2022-06-04 DIAGNOSIS — I110 Hypertensive heart disease with heart failure: Secondary | ICD-10-CM | POA: Diagnosis not present

## 2022-06-04 DIAGNOSIS — I2510 Atherosclerotic heart disease of native coronary artery without angina pectoris: Secondary | ICD-10-CM | POA: Diagnosis not present

## 2022-06-04 DIAGNOSIS — I5042 Chronic combined systolic (congestive) and diastolic (congestive) heart failure: Secondary | ICD-10-CM | POA: Diagnosis not present

## 2022-06-04 DIAGNOSIS — Z96642 Presence of left artificial hip joint: Secondary | ICD-10-CM | POA: Diagnosis not present

## 2022-06-04 HISTORY — DX: Hereditary and idiopathic neuropathy, unspecified: G60.9

## 2022-06-04 HISTORY — DX: Cellulitis of right lower limb: L03.115

## 2022-06-04 HISTORY — DX: Osteomyelitis, unspecified: M86.9

## 2022-06-04 NOTE — Progress Notes (Addendum)
GIANCARLOS, NEWGARD ([id number]) Visit Report for 06/04/2022 Chief Complaint Document Details Patient Name: Date of Service: Hector Beltran 06/04/2022 2:45 PM Medical Record Number: [id number] Patient Account Number: [id number] Date of Birth/Sex: Treating RN: 1928/02/14 (88 y.o. M) Primary Care Provider: Sherrie Mustache Other Clinician: Referring Provider: Treating Provider/Extender: Jennye Moccasin in Treatment: 14 Information Obtained from: Patient Chief Complaint Pressure ulcer left heel and right 2nd toe Electronic Signature(s) Signed: 06/04/2022 2:58:56 PM By: Worthy Keeler PA-C Entered By: Worthy Keeler on 06/04/2022 14:58:56 -------------------------------------------------------------------------------- HPI Details Patient Name: Date of Service: Hector Beltran, Hector Beltran. 06/04/2022 2:45 PM Medical Record Number: [id number] Patient Account Number: [id number] Date of Birth/Sex: Treating RN: 1928-02-23 (88 y.o. M) Primary Care Provider: Sherrie Mustache Other Clinician: Referring Provider: Treating Provider/Extender: Jennye Moccasin in Treatment: 14 History of Present Illness HPI Description: 02-26-2022 upon evaluation today patient presents for initial inspection here in our clinic concerning issues that he has been having with his left heel he has a pressure ulcer that ensued following having fallen and fractured his hip. This actually occurred in April and he had surgery for total hip replacement on the left of December 24, 2021. He was discharged to a SNF at the point of leaving the hospital and it sometime during the hospital stay and SNF stay he developed a pressure ulcer to his left heel. At this point it does not seem to be getting any worse he is at [facility] independent living and they need orders to know what to do. Right now they do think that they can do the dressing changes but he needs to have the supplies and the recommendations  on hand. He did have an x-ray performed on 02-13-2022 which was negative for osteomyelitis this is good news right now they just been using a dry dressing and that is good. Patient does have a history of a left hip replacement total, hypertension, coronary artery disease, COPD, congestive heart failure, and protein calorie malnutrition as noted by low serum albumin and protein. 03-05-2022 upon evaluation today patient appears to be doing well currently in regard to his heel. Unfortunately the dressing that we put on is the one that came back in with. The facility which was supposed to be changing this for him we got all the supplies for them but they never actually changed the dressing. With that being said this obviously is frustrating for the family who states they are definitely can reach out to the facility regard. They will get that straightened out. 03-12-2022 upon evaluation today patient appears to be doing well with regard to his wound although he still is not getting this changed through the week. He is pretty much staying off only put on 20 gets back. Obviously this is not the way we wanted to proceed with this but nonetheless we had a hard time getting home health out to do what they needed to do. We are going to follow-up with a phone call and try to see what the deal is as far as them receiving our orders. We faxed them but unfortunately they do not seem to get them they say to see if there is a different fax number. 03-19-2022 upon evaluation today patient appears to be doing well currently in regard to his wound. In fact this has some slough buildup but seems to be doing quite well. I do not see any evidence of active infection at this time  which is great news. No fevers, chills, nausea, vomiting, or diarrhea. 03-26-2022 upon evaluation today patient's wound is showing signs of improvement. Fortunately there does not appear to be any evidence of active infection locally or systemically at  this time which is great news. No fevers, chills, nausea, vomiting, or diarrhea. 04-02-2022 upon evaluation today patient appears to be doing well currently in regard to his wound. He is actually showing signs of improvement which is great news. Fortunately I do not see any evidence of active infection locally or systemically. There was a lot of dry skin around the edges of the wound once I remove this and actually clean the surface of the wound this is measuring significantly smaller which is great news. I am very pleased in that regard. 04-16-2022 upon evaluation today patient appears to be doing well currently in regard to his wound. He has been tolerating the dressing changes there is been some need for sharp debridement today. Fortunately I do not see any evidence of active infection locally or systemically at this time. 04-30-2022 upon evaluation today patient appears to be doing well with regard to his wound this is actually significantly smaller. The good news is I do believe we are making excellent progress here and I think the wound is looking so much better. 05-14-2022 upon evaluation today patient's wound on the left heel appears to be doing quite well. Fortunately I do not see any evidence of infection at this time. He does have a new wound on his right second toe which does appear to be a pressure injury. 05-28-2022 upon evaluation today patient's wound on the left heel is pretty much all intents and purposes healed I think it will be healed by the next time I see him. With that being said unfortunately he has a new wound on the second toe. He had 1 on the top of the toe last week this week is actually on the bottom of the toe and it actually looks like the dressing was cutting and red at the base of the toe where it articulates to the foot. Nonetheless unfortunately this has caused the wound which is actually on the way down to tendon and bone. It is also very red and swollen and inflamed it  appears likely that this is probably a bone infection. With that being said the patient is not showing signs of sepsis necessarily at this point although he has had a low pulse. I am unsure if that could be somehow affected by his infection. Again I do not think he is septic or least is not showing traditional signs but nonetheless he is definitely having a pretty significant issue here with his toe that was not present when I saw him 2 weeks ago. 06-04-2022 upon evaluation today patient appears to be doing well currently in regard to his wound. He has actually been tolerating the dressing changes without complication. Fortunately I think that the left heel is actually completely healed. Unfortunately the right second toe is still giving him complication and at this point he is actually set to have this amputated on the 28th. This is quite unfortunate that is led to that but I do believe it is probably his best option to get this healed and hopefully keep it healed as well following. He was given Dalvance when I sent him to the hospital last week then sent home however as an outpatient infectious disease as well as Dr. Doran Durand determined that the patient is going require  amputation as definitive treatment here. Electronic Signature(s) Signed: 06/04/2022 5:22:57 PM By: Lenda Kelp PA-C Entered By: Lenda Kelp on 06/04/2022 17:22:56 -------------------------------------------------------------------------------- Physical Exam Details Patient Name: Date of Service: [name] 06/04/2022 2:45 PM Medical Record Number: [id number] Patient Account Number: [id number] Date of Birth/Sex: Treating RN: May 28, 1928 (88 y.o. M) Primary Care Provider: Abbey Chatters Other Clinician: Referring Provider: Treating Provider/Extender: Prudencio Burly in Treatment: 14 Constitutional Well-nourished and well-hydrated in no acute distress. Respiratory normal breathing without  difficulty. Psychiatric this patient is able to make decisions and demonstrates good insight into disease process. Alert and Oriented x 3. pleasant and cooperative. Notes Upon inspection patient's wounds did not require any sharp debridement his left heel is completely healed which is great news and his toe on the right is actually not looking quite as red but still has a significant wound on the underside at this point unfortunately. Electronic Signature(s) Signed: 06/04/2022 5:23:27 PM By: Lenda Kelp PA-C Entered By: Lenda Kelp on 06/04/2022 17:23:26 -------------------------------------------------------------------------------- Physician Orders Details Patient Name: Date of Service: [name], [name] 06/04/2022 2:45 PM Medical Record Number: [id number] Patient Account Number: [id number] Date of Birth/Sex: Treating RN: 01/05/1928 (88 y.o. Cline Cools Primary Care Provider: Abbey Chatters Other Clinician: Referring Provider: Treating Provider/Extender: Prudencio Burly in Treatment: [phone] Verbal / Phone Orders: No Diagnosis Coding ICD-10 Coding Code Description [phone] Pressure ulcer of left heel, stage 3 L89.894 Pressure ulcer of other site, stage 4 Z96.642 Presence of left artificial hip joint I10 Essential (primary) hypertension I25.10 Atherosclerotic heart disease of native coronary artery without angina pectoris J44.9 Chronic obstructive pulmonary disease, unspecified I50.42 Chronic combined systolic (congestive) and diastolic (congestive) heart failure E44.0 Moderate protein-calorie malnutrition Follow-up Appointments Other: - post surgery and care from surgeon, if needed call wound care center Bathing/ Shower/ Hygiene May shower with protection but do not get wound dressing(s) wet. - May use cast protector bag from [employer], [facility] or [facility] on days dressing not being changed. May shower and wash wound with soap and water. - May wash with  antibacterial soap when changing dressing. Edema Control - Lymphedema / SCD / Other Elevate legs to the level of the heart or above for 30 minutes daily and/or when sitting, a frequency of: - throughout the day Avoid standing for long periods of time. Off-Loading Other: - Avoid/Limit pressure to heel. Recommend wearing a sandal that does not rub heel area. Additional Orders / Instructions Follow Nutritious Diet - High Protein Diet: 100g of Protein daily Other: - May participate in PT if has appropriate footwear. Non Wound Condition pply the following to affected area as directed: - Apply Antifungal/Athletes Foot cream to right foot. A Wound Treatment Wound #2 - T Second oe Wound Laterality: Right Cleanser: Soap and Water 1 x Per Day/15 Days Discharge Instructions: Wash with Dial Antibacterial Soap Cleanser: Wound Cleanser (Generic) 1 x Per Day/15 Days Discharge Instructions: Cleanse the wound with wound cleanser prior to applying a clean dressing using gauze sponges, not tissue or cotton balls. Prim Dressing: Xeroform Occlusive Gauze Dressing, 4x4 in (Generic) 1 x Per Day/15 Days ary Discharge Instructions: Apply to wound bed as instructed Secondary Dressing: Woven Gauze Sponge, Non-Sterile 4x4 in (Generic) 1 x Per Day/15 Days Discharge Instructions: Fold and secure under right second toe without wrapping around the toe. Secured With: 31M Medipore H Soft Cloth Surgical T ape, 4 x 10 (in/yd) (Generic) 1 x Per  Day/15 Days Discharge Instructions: Secure with tape as directed. Wound #3 - T Second oe Wound Laterality: Plantar, Right Cleanser: Soap and Water 1 x Per Day/15 Days Discharge Instructions: Wash with Dial Antibacterial Soap Cleanser: Wound Cleanser (Generic) 1 x Per NLZ/76 Days Discharge Instructions: Cleanse the wound with wound cleanser prior to applying a clean dressing using gauze sponges, not tissue or cotton balls. Prim Dressing: Xeroform Occlusive Gauze Dressing, 4x4 in  (Generic) 1 x Per Day/15 Days ary Discharge Instructions: Apply to wound bed as instructed Secondary Dressing: Woven Gauze Sponge, Non-Sterile 4x4 in (Generic) 1 x Per Day/15 Days Discharge Instructions: Fold and secure under right second toe without wrapping around the toe. Secured With: 54M Medipore H Soft Cloth Surgical T ape, 4 x 10 (in/yd) (Generic) 1 x Per Day/15 Days Discharge Instructions: Secure with tape as directed. Electronic Signature(s) Signed: 06/04/2022 5:51:27 PM By: Worthy Keeler PA-C Signed: 06/06/2022 10:29:43 AM By: Sharyn Creamer RN, BSN Entered By: Sharyn Creamer on 06/04/2022 15:35:54 -------------------------------------------------------------------------------- Problem List Details Patient Name: Date of Service: Hector Beltran, Hector Beltran 06/04/2022 2:45 PM Medical Record Number: [id number] Patient Account Number: [id number] Date of Birth/Sex: Treating RN: July 15, 1928 (88 y.o. M) Primary Care Provider: Sherrie Mustache Other Clinician: Referring Provider: Treating Provider/Extender: Jennye Moccasin in Treatment: 14 Active Problems ICD-10 Encounter Code Description Active Date MDM Diagnosis [phone] Pressure ulcer of left heel, stage 3 02/26/2022 No Yes L89.894 Pressure ulcer of other site, stage 4 05/14/2022 No Yes Z96.642 Presence of left artificial hip joint 02/26/2022 No Yes I10 Essential (primary) hypertension 02/26/2022 No Yes I25.10 Atherosclerotic heart disease of native coronary artery without angina pectoris 02/26/2022 No Yes J44.9 Chronic obstructive pulmonary disease, unspecified 02/26/2022 No Yes I50.42 Chronic combined systolic (congestive) and diastolic (congestive) heart failure 02/26/2022 No Yes E44.0 Moderate protein-calorie malnutrition 02/26/2022 No Yes Inactive Problems Resolved Problems Electronic Signature(s) Signed: 06/04/2022 2:58:51 PM By: Worthy Keeler PA-C Entered By: Worthy Keeler on 06/04/2022  14:58:51 -------------------------------------------------------------------------------- Progress Note Details Patient Name: Date of Service: Hector Beltran, Hector Beltran. 06/04/2022 2:45 PM Medical Record Number: [id number] Patient Account Number: [id number] Date of Birth/Sex: Treating RN: 1928/04/09 (88 y.o. M) Primary Care Provider: Sherrie Mustache Other Clinician: Referring Provider: Treating Provider/Extender: Jennye Moccasin in Treatment: 14 Subjective Chief Complaint Information obtained from Patient Pressure ulcer left heel and right 2nd toe History of Present Illness (HPI) 02-26-2022 upon evaluation today patient presents for initial inspection here in our clinic concerning issues that he has been having with his left heel he has a pressure ulcer that ensued following having fallen and fractured his hip. This actually occurred in April and he had surgery for total hip replacement on the left of December 24, 2021. He was discharged to a SNF at the point of leaving the hospital and it sometime during the hospital stay and SNF stay he developed a pressure ulcer to his left heel. At this point it does not seem to be getting any worse he is at [facility] independent living and they need orders to know what to do. Right now they do think that they can do the dressing changes but he needs to have the supplies and the recommendations on hand. He did have an x-ray performed on 02-13-2022 which was negative for osteomyelitis this is good news right now they just been using a dry dressing and that is good. Patient does have a history of a left hip replacement total, hypertension,  coronary artery disease, COPD, congestive heart failure, and protein calorie malnutrition as noted by low serum albumin and protein. 03-05-2022 upon evaluation today patient appears to be doing well currently in regard to his heel. Unfortunately the dressing that we put on is the one that came back in with. The  facility which was supposed to be changing this for him we got all the supplies for them but they never actually changed the dressing. With that being said this obviously is frustrating for the family who states they are definitely can reach out to the facility regard. They will get that straightened out. 03-12-2022 upon evaluation today patient appears to be doing well with regard to his wound although he still is not getting this changed through the week. He is pretty much staying off only put on 20 gets back. Obviously this is not the way we wanted to proceed with this but nonetheless we had a hard time getting home health out to do what they needed to do. We are going to follow-up with a phone call and try to see what the deal is as far as them receiving our orders. We faxed them but unfortunately they do not seem to get them they say to see if there is a different fax number. 03-19-2022 upon evaluation today patient appears to be doing well currently in regard to his wound. In fact this has some slough buildup but seems to be doing quite well. I do not see any evidence of active infection at this time which is great news. No fevers, chills, nausea, vomiting, or diarrhea. 03-26-2022 upon evaluation today patient's wound is showing signs of improvement. Fortunately there does not appear to be any evidence of active infection locally or systemically at this time which is great news. No fevers, chills, nausea, vomiting, or diarrhea. 04-02-2022 upon evaluation today patient appears to be doing well currently in regard to his wound. He is actually showing signs of improvement which is great news. Fortunately I do not see any evidence of active infection locally or systemically. There was a lot of dry skin around the edges of the wound once I remove this and actually clean the surface of the wound this is measuring significantly smaller which is great news. I am very pleased in that regard. 04-16-2022 upon  evaluation today patient appears to be doing well currently in regard to his wound. He has been tolerating the dressing changes there is been some need for sharp debridement today. Fortunately I do not see any evidence of active infection locally or systemically at this time. 04-30-2022 upon evaluation today patient appears to be doing well with regard to his wound this is actually significantly smaller. The good news is I do believe we are making excellent progress here and I think the wound is looking so much better. 05-14-2022 upon evaluation today patient's wound on the left heel appears to be doing quite well. Fortunately I do not see any evidence of infection at this time. He does have a new wound on his right second toe which does appear to be a pressure injury. 05-28-2022 upon evaluation today patient's wound on the left heel is pretty much all intents and purposes healed I think it will be healed by the next time I see him. With that being said unfortunately he has a new wound on the second toe. He had 1 on the top of the toe last week this week is actually on the bottom of the toe and  it actually looks like the dressing was cutting and red at the base of the toe where it articulates to the foot. Nonetheless unfortunately this has caused the wound which is actually on the way down to tendon and bone. It is also very red and swollen and inflamed it appears likely that this is probably a bone infection. With that being said the patient is not showing signs of sepsis necessarily at this point although he has had a low pulse. I am unsure if that could be somehow affected by his infection. Again I do not think he is septic or least is not showing traditional signs but nonetheless he is definitely having a pretty significant issue here with his toe that was not present when I saw him 2 weeks ago. 06-04-2022 upon evaluation today patient appears to be doing well currently in regard to his wound. He has  actually been tolerating the dressing changes without complication. Fortunately I think that the left heel is actually completely healed. Unfortunately the right second toe is still giving him complication and at this point he is actually set to have this amputated on the 28th. This is quite unfortunate that is led to that but I do believe it is probably his best option to get this healed and hopefully keep it healed as well following. He was given Dalvance when I sent him to the hospital last week then sent home however as an outpatient infectious disease as well as Dr. Doran Durand determined that the patient is going require amputation as definitive treatment here. Objective Constitutional Well-nourished and well-hydrated in no acute distress. Vitals Time Taken: 3:00 PM, Height: 75 in, Weight: 192 lbs, BMI: 24, Temperature: 98.0 F, Pulse: 83 bpm, Respiratory Rate: 18 breaths/min, Blood Pressure: 167/70 mmHg. Respiratory normal breathing without difficulty. Psychiatric this patient is able to make decisions and demonstrates good insight into disease process. Alert and Oriented x 3. pleasant and cooperative. General Notes: Upon inspection patient's wounds did not require any sharp debridement his left heel is completely healed which is great news and his toe on the right is actually not looking quite as red but still has a significant wound on the underside at this point unfortunately. Integumentary (Hair, Skin) Wound #1 status is Healed - Epithelialized. Original cause of wound was Pressure Injury. The date acquired was: 01/08/2022. The wound has been in treatment 14 weeks. The wound is located on the Left Calcaneus. The wound measures 0cm length x 0cm width x 0cm depth; 0cm^2 area and 0cm^3 volume. There is no tunneling or undermining noted. There is a medium amount of serosanguineous drainage noted. The wound margin is distinct with the outline attached to the wound base. There is no granulation  within the wound bed. There is no necrotic tissue within the wound bed. Wound #2 status is Open. Original cause of wound was Blister. The date acquired was: 05/02/2022. The wound has been in treatment 3 weeks. The wound is located on the Right T Second. The wound measures 0.6cm length x 1.3cm width x 0.2cm depth; 0.613cm^2 area and 0.123cm^3 volume. There is Fat Layer oe (Subcutaneous Tissue) exposed. There is no tunneling or undermining noted. There is a medium amount of serosanguineous drainage noted. There is no granulation within the wound bed. There is a large (67-100%) amount of necrotic tissue within the wound bed including Eschar. Wound #3 status is Open. Original cause of wound was Gradually Appeared. The date acquired was: 05/28/2022. The wound has been in treatment 1  weeks. The wound is located on the Right,Plantar T Second. The wound measures 0.3cm length x 2cm width x 0.2cm depth; 0.471cm^2 area and 0.094cm^3 volume. oe There is bone, tendon, and Fat Layer (Subcutaneous Tissue) exposed. There is no tunneling or undermining noted. There is a medium amount of serosanguineous drainage noted. The wound margin is distinct with the outline attached to the wound base. There is large (67-100%) red, pink granulation within the wound bed. There is a small (1-33%) amount of necrotic tissue within the wound bed including Adherent Slough. 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 - May use cast protector bag from [employer], [place] or [facility] on days dressing not being changed. May shower and wash wound with soap and water. - May wash with antibacterial soap when changing dressing. Edema Control - Lymphedema / SCD / Other: Elevate legs to the level of the heart or above for 30 minutes daily and/or when sitting, a frequency of: - throughout the day Avoid standing for long periods of time. Off-Loading: Other: - Avoid/Limit pressure to heel. Recommend wearing a sandal that does not rub heel area. Additional Orders / Instructions: Follow Nutritious Diet - High Protein Diet: 100g of Protein daily Other: - May participate in PT if has appropriate footwear. Non Wound Condition: Apply the following to affected area as directed: - Apply Antifungal/Athletes Foot cream to right foot. WOUND #2: - T Second Wound Laterality: Right oe Cleanser: Soap and Water 1 x Per Day/15 Days Discharge Instructions: Wash with Dial Antibacterial Soap Cleanser: Wound Cleanser (Generic) 1 x Per [id number] Days Discharge Instructions: Cleanse the wound with wound cleanser prior to applying a clean dressing using gauze sponges, not tissue or cotton balls. Prim Dressing: Xeroform Occlusive Gauze Dressing, 4x4 in (Generic) 1 x Per Day/15 Days ary Discharge Instructions: Apply to wound bed as instructed Secondary Dressing: Woven Gauze Sponge, Non-Sterile 4x4 in (Generic) 1 x Per Day/15 Days Discharge Instructions: Fold and secure under right second toe without wrapping around the toe. Secured With: 45M Medipore H Soft Cloth Surgical T ape, 4 x 10 (in/yd) (Generic) 1 x Per Day/15 Days Discharge Instructions: Secure with tape as directed. WOUND #3: - T Second Wound Laterality: Plantar, Right oe Cleanser: Soap and Water 1 x Per Day/15 Days Discharge Instructions: Wash with Dial Antibacterial Soap Cleanser: Wound Cleanser (Generic) 1 x  Per Day/15 Days Discharge Instructions: Cleanse the wound with wound cleanser prior to applying a clean dressing using gauze sponges, not tissue or cotton balls. Prim Dressing: Xeroform Occlusive Gauze Dressing, 4x4 in (Generic) 1 x Per Day/15 Days ary Discharge Instructions: Apply to wound bed as instructed Secondary Dressing: Woven Gauze Sponge, Non-Sterile 4x4 in (Generic) 1 x Per Day/15 Days Discharge Instructions: Fold and secure under right second toe without wrapping around the toe. Secured With: 45M Medipore H Soft Cloth Surgical T ape, 4 x 10 (in/yd) (Generic) 1 x Per Day/15 Days Discharge Instructions: Secure with tape as directed. 1. I am good recommend currently that we actually have the patient go ahead and continue  with the recommendation for wound care measures as before and the patient is in agreement with plan this includes the use of the Xeroform gauze to the second toe right foot which I do believe is doing well for him currently all things considered. 2. I am also can recommend that we have the patient continue to monitor for any signs of worsening or infection if anything changes they should definitely follow-up in the ER as needed but my hope is he will not require any additional treatment prior to the amputation surgery for the second toe right foot which is set for the 28th of this month September 2023. We will see patient back for reevaluation in 1 week here in the clinic. If anything worsens or changes patient will contact our office for additional recommendations. Electronic Signature(s) Signed: 06/04/2022 5:24:17 PM By: Worthy Keeler PA-C Entered By: Worthy Keeler on 06/04/2022 17:24:17 -------------------------------------------------------------------------------- SuperBill Details Patient Name: Date of Service: Hector Beltran, Hector Beltran 06/04/2022 Medical Record Number: [id number] Patient Account Number: [id number] Date of Birth/Sex: Treating RN: [birth year] (88 y.o. Mare Beltran Primary Care Provider: Sherrie Mustache Other Clinician: Referring Provider: Treating Provider/Extender: Jennye Moccasin in Treatment: 14 Diagnosis Coding ICD-10 Codes Code Description [phone] Pressure ulcer of left heel, stage 3 L89.894 Pressure ulcer of other site, stage 4 Z96.642 Presence of left artificial hip joint I10 Essential (primary) hypertension I25.10 Atherosclerotic heart disease of native coronary artery without angina pectoris J44.9 Chronic obstructive pulmonary disease, unspecified I50.42 Chronic combined systolic (congestive) and diastolic (congestive) heart failure E44.0 Moderate protein-calorie malnutrition Facility Procedures CPT4 Code: [id number] Description: 99214 - WOUND CARE VISIT-LEV 4 EST PT Modifier: 25 Quantity: 1 Physician Procedures : CPT4 Code Description Modifier [id number] - WC PHYS LEVEL 3 - EST PT ICD-10 Diagnosis Description L89.623 Pressure ulcer of left heel, stage 3 L89.894 Pressure ulcer of other site, stage 4 Z96.642 Presence of left artificial hip joint I10 Essential  (primary) hypertension Quantity: 1 Electronic Signature(s) Signed: 06/04/2022 5:24:48 PM By: Worthy Keeler PA-C Entered By: Worthy Keeler on 06/04/2022 17:24:48

## 2022-06-04 NOTE — Telephone Encounter (Signed)
        Patient  visited [facility] [facility] on 9/14    Telephone encounter attempt : 1st    A HIPAA compliant voice message was left requesting a return call.  Instructed patient to call back    [facility], [place], [facility]  [phone] [address], [place], [place] [address] Phone: [phone] Email: [email or web address]       

## 2022-06-04 NOTE — Progress Notes (Signed)
Is in for infectious disease consult: Osteomyelitis of right second toe.  Requesting physician: Hector Sprout, MD  Subjective:    Patient ID: Hector Beltran, male    DOB: 1928/04/01, 88 y.o.   MRN: [id number]  HPI  Hector Beltran is a 88 year old Caucasian man with a history of supraventricular tachycardia coronary disease CHF, COPD peripheral neuropathy of unknown cause who resides in [facility] independent living.  While he is able to walk he spends quite a bit of time in a wheelchair.  Developed ulcers on his left heel that developed after left hip fracture hospitalization in April 2023.  Left wound healed up with close follow-up with wound care though there was no wound care at his facility that could perform dressing changes.  He then developed an ulcer involving the right second to which family tell me he is believed to partially a consequence of a constrictive bandage which was wrapped around that toe.   Wound extends to the level of tendon and bone.  Was seen in wound care and had a swab sent for culture which yielded MRSA Pseudomonas aeruginosa and E. coli.  He was sent to the emergency department for evaluation.  He was given a dose of Vantin and referred to our clinic.  The referring diagnosis was cellulitis though I cannot find evidence that the patient actually has cellulitis but does have indeed osteomyelitis.  He has been evaluated by by Dr. Victorino Dike and is scheduled for surgery on June 12, 2022.  His wound appears stable and he has no evidence of a systemic infection on exam.     Past Medical History:  Diagnosis Date   Cellulitis of right foot 06/04/2022   Constipation    COVID 05/27/2021   Gait abnormality 02/09/2020   Idiopathic neuropathy 06/04/2022   Neuropathy    Osteomyelitis of second toe of right foot (HCC) 06/04/2022   Peripheral neuropathy 09/30/2017   SVT (supraventricular tachycardia) [facility])     Past Surgical History:  Procedure Laterality Date    CYST REMOVAL NECK     HIP ARTHROPLASTY Left 12/24/2021   Procedure: ARTHROPLASTY BIPOLAR HIP (HEMIARTHROPLASTY);  Surgeon: Eldred Manges, MD;  Location: [facility];  Service: Orthopedics;  Laterality: Left;    No family history on file.    Social History   Socioeconomic History   Marital status: Widowed    Spouse name: Not on file   Number of children: 2   Years of education: College   Highest education level: Not on file  Occupational History   Occupation: Retired  Tobacco Use   Smoking status: Former    Packs/day: 0.25    Years: 50.00    Total pack years: 12.50    Types: Cigarettes    Quit date: 08/15/2020    Years since quitting: 1.8   Smokeless tobacco: Never   Tobacco comments:    2 packs per week, quit 08/2020  Vaping Use   Vaping Use: Never used  Substance and Sexual Activity   Alcohol use: Yes    Alcohol/week: 3.0 standard drinks of alcohol    Types: 1 Glasses of wine, 1 Cans of beer, 1 Shots of liquor per week    Comment: rarely   Drug use: No   Sexual activity: Not on file  Other Topics Concern   Not on file  Social History Narrative   Lives alone at [place]   Caffeine use: Coffee daily   Right handed    Social Determinants of Health  Financial Resource Strain: Not on file  Food Insecurity: Not on file  Transportation Needs: Not on file  Physical Activity: Not on file  Stress: Not on file  Social Connections: Not on file    No Known Allergies   Current Outpatient Medications:    B Complex Vitamins (B COMPLEX PO), Take 1 tablet by mouth daily., Disp: , Rfl:    Coenzyme Q10 (CO Q-10 PO), Take 1 capsule by mouth daily., Disp: , Rfl:    Omega-3 Fatty Acids (OMEGA 3 PO), Take 1 capsule by mouth daily., Disp: , Rfl:    triamcinolone cream (KENALOG) 0.1 %, Apply 1 Application topically 2 (two) times daily., Disp: 30 g, Rfl: 0   nystatin ointment (MYCOSTATIN), Apply 1 Application topically 2 (two) times daily. (Patient not taking: Reported on 06/04/2022),  Disp: 30 g, Rfl: 0   sulfamethoxazole-trimethoprim (BACTRIM DS) 800-160 MG tablet, Take 1 tablet by mouth 2 (two) times daily. Dose unknown- patient will need to confirm (Patient not taking: Reported on 06/04/2022), Disp: , Rfl:    Review of Systems  Constitutional:  Negative for activity change, appetite change, chills, diaphoresis, fatigue, fever and unexpected weight change.  HENT:  Negative for congestion, rhinorrhea, sinus pressure, sneezing, sore throat and trouble swallowing.   Eyes:  Negative for photophobia and visual disturbance.  Respiratory:  Negative for cough, chest tightness, shortness of breath, wheezing and stridor.   Cardiovascular:  Negative for chest pain, palpitations and leg swelling.  Gastrointestinal:  Negative for abdominal distention, abdominal pain, anal bleeding, blood in stool, constipation, diarrhea, nausea and vomiting.  Genitourinary:  Negative for difficulty urinating, dysuria, flank pain and hematuria.  Musculoskeletal:  Negative for arthralgias, back pain, gait problem, joint swelling and myalgias.  Skin:  Positive for wound. Negative for color change, pallor and rash.  Neurological:  Positive for numbness. Negative for dizziness, tremors, weakness and light-headedness.  Hematological:  Negative for adenopathy. Does not bruise/bleed easily.  Psychiatric/Behavioral:  Negative for agitation, behavioral problems, confusion, decreased concentration, dysphoric mood and sleep disturbance.        Objective:   Physical Exam Constitutional:      Appearance: He is well-developed.  HENT:     Head: Normocephalic and atraumatic.  Eyes:     Conjunctiva/sclera: Conjunctivae normal.  Cardiovascular:     Rate and Rhythm: Normal rate and regular rhythm.  Pulmonary:     Effort: Pulmonary effort is normal. No respiratory distress.     Breath sounds: No wheezing.  Abdominal:     General: There is no distension.     Palpations: Abdomen is soft.  Musculoskeletal:      Cervical back: Normal range of motion and neck supple.  Skin:    General: Skin is warm and dry.     Coloration: Skin is not pale.     Findings: No erythema or rash.  Neurological:     General: No focal deficit present.     Mental Status: He is alert and oriented to person, place, and time.  Psychiatric:        Mood and Affect: Mood normal.        Behavior: Behavior normal.        Thought Content: Thought content normal.        Judgment: Judgment normal.     Left foot 06/04/2022:       Right toe 06/04/2022:        Assessment & Plan:   Osteomyelitis of second toe:  He is going to  have surgery next week with Dr. Victorino Dike and have amputation of the toe.  Will be helpful if bone and tissue was sent for culture.  I would like to give him some "mop up antibiotics"  I do NOT want to target ALL 3 of the organisms slated on wound culture.  First of all they may not be culprit pathogens  The Pseudomonas would only be able to be treated with a flora quinolone as an oral therapy and the E. coli is resistant to fluoroquinolones   Not wanting to give this 88 year old man fluoroquinolones and hazard the risks of encephalopathy and C. difficile colitis.  On empiric oral regimen that he could be given after surgery that I think would be reasonable would be Zyvox 600 mg twice daily with a 2-week supply.  This would cover the MRSA that he has and would cover streptococcal species.  I have scheduled him for follow-up with me so I can follow-up on the results of his culture data and adjust antibiotics accordingly.  I spent 90  minutes with the patient including than 50% of the time in face to face counseling of the patient and family regarding the nature of osteomyelitis the need for surgical intervention the role of antibiotics after surgery, personally reviewing plain films of the foot, along with review of medical records in preparation for the visit and during the visit and in  coordination of nis care.

## 2022-06-05 ENCOUNTER — Telehealth: Payer: Self-pay

## 2022-06-05 ENCOUNTER — Encounter (HOSPITAL_BASED_OUTPATIENT_CLINIC_OR_DEPARTMENT_OTHER): Payer: Self-pay | Admitting: Orthopedic Surgery

## 2022-06-05 ENCOUNTER — Other Ambulatory Visit: Payer: Self-pay

## 2022-06-05 NOTE — Telephone Encounter (Signed)
        Patient  visited [facility] on 9/13    Telephone encounter attempt :  1st  A HIPAA compliant voice message was left requesting a return call.  Instructed patient to call back    [facility]  [phone] [address], [place], [place] [address] Phone: [phone] Email: [email or web address]

## 2022-06-06 NOTE — Progress Notes (Addendum)
Admire, Holbrook ([id number]) [id number] Page 1 of 9 Visit Report for 06/04/2022 Arrival Information Details Patient Name: Date of Service: Hector Beltran 06/04/2022 2:45 PM Medical Record Number: [id number] Patient Account Number: [id number] Date of Birth/Sex: Treating RN: 1928/07/30 (88 y.o. Mare Beltran Primary Care Xochilt Conant: Sherrie Mustache Other Clinician: Referring Ewel Lona: Treating Braelyn Jenson/Extender: Jennye Moccasin in Treatment: 14 Visit Information History Since Last Visit Added or deleted any medications: No Patient Arrived: Wheel Chair Any new allergies or adverse reactions: No Arrival Time: 14:58 Had a fall or experienced change in No Accompanied By: son activities of daily living that may affect Transfer Assistance: None risk of falls: Patient Identification Verified: Yes Signs or symptoms of abuse/neglect since last visito No Secondary Verification Process Completed: Yes Hospitalized since last visit: No Patient Requires Transmission-Based Precautions: No Implantable device outside of the clinic excluding No Patient Has Alerts: No cellular tissue based products placed in the center since last visit: Has Dressing in Place as Prescribed: Yes Pain Present Now: No Electronic Signature(s) Signed: 06/06/2022 10:29:43 AM By: Sharyn Creamer RN, BSN Entered By: Sharyn Creamer on 06/04/2022 15:36:38 -------------------------------------------------------------------------------- Clinic Level of Care Assessment Details Patient Name: Date of Service: Hector Beltran, Hector Beltran 06/04/2022 2:45 PM Medical Record Number: [id number] Patient Account Number: [id number] Date of Birth/Sex: Treating RN: [birth year] (88 y.o. Mare Beltran Primary Care Jarrick Fjeld: Sherrie Mustache Other Clinician: Referring Zale Marcotte: Treating Tijah Hane/Extender: Jennye Moccasin in Treatment: 14 Clinic Level of Care Assessment  Items TOOL 4 Quantity Score X- 1 0 Use when only an EandM is performed on FOLLOW-UP visit ASSESSMENTS - Nursing Assessment / Reassessment X- 1 10 Reassessment of Co-morbidities (includes updates in patient status) X- 1 5 Reassessment of Adherence to Treatment Plan ASSESSMENTS - Wound and Skin A ssessment / Reassessment '[]'  - 0 Simple Wound Assessment / Reassessment - one wound X- 3 5 Complex Wound Assessment / Reassessment - multiple wounds '[]'  - 0 Dermatologic / Skin Assessment (not related to wound area) ASSESSMENTS - Focused Assessment X- 2 5 Circumferential Edema Measurements - multi extremities '[]'  - 0 Nutritional Assessment / Counseling / Intervention Hector Beltran ([id number]) [id number] Page 2 of 9 '[]'  - 0 Lower Extremity Assessment (monofilament, tuning fork, pulses) '[]'  - 0 Peripheral Arterial Disease Assessment (using hand held doppler) ASSESSMENTS - Ostomy and/or Continence Assessment and Care '[]'  - 0 Incontinence Assessment and Management '[]'  - 0 Ostomy Care Assessment and Management (repouching, etc.) PROCESS - Coordination of Care X - Simple Patient / Family Education for ongoing care 1 15 '[]'  - 0 Complex (extensive) Patient / Family Education for ongoing care X- 1 10 Staff obtains Programmer, systems, Records, T Results / Process Orders est '[]'  - 0 Staff telephones HHA, Nursing Homes / Clarify orders / etc '[]'  - 0 Routine Transfer to another Facility (non-emergent condition) '[]'  - 0 Routine Hospital Admission (non-emergent condition) '[]'  - 0 New Admissions / Biomedical engineer / Ordering NPWT Apligraf, etc. , '[]'  - 0 Emergency Hospital Admission (emergent condition) '[]'  - 0 Simple Discharge Coordination '[]'  - 0 Complex (extensive) Discharge Coordination PROCESS - Special Needs '[]'  - 0 Pediatric / Minor Patient Management '[]'  - 0 Isolation Patient Management '[]'  - 0 Hearing / Language / Visual special needs '[]'  - 0 Assessment of Community  assistance (transportation, D/C planning, etc.) '[]'  - 0 Additional assistance / Altered mentation '[]'  - 0 Support Surface(s) Assessment (bed, cushion, seat, etc.) INTERVENTIONS - Wound Cleansing / Measurement '[]'  -  0 Simple Wound Cleansing - one wound X- 3 5 Complex Wound Cleansing - multiple wounds X- 1 5 Wound Imaging (photographs - any number of wounds) '[]'  - 0 Wound Tracing (instead of photographs) '[]'  - 0 Simple Wound Measurement - one wound X- 2 5 Complex Wound Measurement - multiple wounds INTERVENTIONS - Wound Dressings X - Small Wound Dressing one or multiple wounds 2 10 '[]'  - 0 Medium Wound Dressing one or multiple wounds '[]'  - 0 Large Wound Dressing one or multiple wounds '[]'  - 0 Application of Medications - topical '[]'  - 0 Application of Medications - injection INTERVENTIONS - Miscellaneous '[]'  - 0 External ear exam '[]'  - 0 Specimen Collection (cultures, biopsies, blood, body fluids, etc.) '[]'  - 0 Specimen(s) / Culture(s) sent or taken to Lab for analysis '[]'  - 0 Patient Transfer (multiple staff / Civil Service fast streamer / Similar devices) '[]'  - 0 Simple Staple / Suture removal (25 or less) '[]'  - 0 Complex Staple / Suture removal (26 or more) '[]'  - 0 Hypo / Hyperglycemic Management (close monitor of Blood Glucose) MARIANO, DOSHI ([id number]) [id number] Page 3 of 9 '[]'  - 0 Ankle / Brachial Index (ABI) - do not check if billed separately X- 1 5 Vital Signs Has the patient been seen at the hospital within the last three years: Yes Total Score: 120 Level Of Care: New/Established - Level 4 Electronic Signature(s) Signed: 06/06/2022 10:29:43 AM By: Sharyn Creamer RN, BSN Entered By: Sharyn Creamer on 06/04/2022 15:59:27 -------------------------------------------------------------------------------- Encounter Discharge Information Details Patient Name: Date of Service: Hector Beltran, Hector Beltran. 06/04/2022 2:45 PM Medical Record Number: [id number] Patient Account  Number: [id number] Date of Birth/Sex: Treating RN: 18-Oct-1927 (88 y.o. Mare Beltran Primary Care Concha Sudol: Sherrie Mustache Other Clinician: Referring Vanellope Passmore: Treating Rondal Vandevelde/Extender: Jennye Moccasin in Treatment: 14 Encounter Discharge Information Items Discharge Condition: Stable Ambulatory Status: Wheelchair Discharge Destination: Home Transportation: Private Auto Accompanied By: son Schedule Follow-up Appointment: Yes Clinical Summary of Care: Patient Declined Electronic Signature(s) Signed: 06/06/2022 10:29:43 AM By: Sharyn Creamer RN, BSN Entered By: Sharyn Creamer on 06/04/2022 15:36:27 -------------------------------------------------------------------------------- Lower Extremity Assessment Details Patient Name: Date of Service: Hector Beltran, Hector Beltran 06/04/2022 2:45 PM Medical Record Number: [id number] Patient Account Number: [id number] Date of Birth/Sex: Treating RN: [birth year] (88 y.o. Mare Beltran Primary Care Beonca Gibb: Sherrie Mustache Other Clinician: Referring Tasmine Hipwell: Treating Salil Raineri/Extender: Jennye Moccasin in Treatment: 14 Edema Assessment Assessed: [Left: No] [Right: No] Edema: [Left: Ye] [Right: s] Calf Left: Right: Point of Measurement: 33 cm From Medial Instep 37.5 cm 37 cm Ankle Left: Right: Point of Measurement: 10 cm From Medial Instep 25 cm 22.7 cm Vascular Assessment [place], Jerrye Beavers ([id number]) [Right:[id number] Page 4 of 9] Pulses: Dorsalis Pedis Palpable: [Left:Yes] [Right:Yes] Electronic Signature(s) Signed: 06/06/2022 10:29:43 AM By: Sharyn Creamer RN, BSN Entered By: Sharyn Creamer on 06/04/2022 15:10:41 -------------------------------------------------------------------------------- Multi-Disciplinary Care Plan Details Patient Name: Date of Service: Hector Beltran, Hector Beltran 06/04/2022 2:45 PM Medical Record Number: [id number] Patient Account Number: [id number] Date  of Birth/Sex: Treating RN: 03-02-1928 (88 y.o. Mare Beltran Primary Care Nailani Full: Sherrie Mustache Other Clinician: Referring Kanaya Gunnarson: Treating Darrion Macaulay/Extender: Jennye Moccasin in Treatment: 14 Active Inactive Electronic Signature(s) Signed: 06/25/2022 5:54:47 PM By: Deon Pilling RN, BSN Signed: 11/03/2022 9:56:31 AM By: Sharyn Creamer RN, BSN Previous Signature: 06/06/2022 10:29:43 AM Version By: Sharyn Creamer RN, BSN Entered By: Deon Pilling on 06/25/2022 17:54:47 -------------------------------------------------------------------------------- Pain Assessment Details Patient Name: Date of  Service: Hector Beltran, Hector Beltran 06/04/2022 2:45 PM Medical Record Number: [id number] Patient Account Number: [id number] Date of Birth/Sex: Treating RN: [birth year] (88 y.o. Mare Beltran Primary Care Shonette Rhames: Sherrie Mustache Other Clinician: Referring Marlina Cataldi: Treating Lasonja Lakins/Extender: Jennye Moccasin in Treatment: 14 Active Problems Location of Pain Severity and Description of Pain Patient Has [facility] [address] [place], [place] ([id number]) [phone].pdf Page 5 of 9 Pain Management and Medication Current Pain Management: Electronic Signature(s) Signed: 06/06/2022 10:29:43 AM By: Sharyn Creamer RN, BSN Entered By: Sharyn Creamer on 06/04/2022 15:01:57 -------------------------------------------------------------------------------- Patient/Caregiver Education Details Patient Name: Date of Service: Hector Beltran, Hector Beltran 9/20/2023andnbsp2:45 PM Medical Record Number: [id number] Patient Account Number: [id number] Date of Birth/Gender: Treating RN: 1928/07/05 (88 y.o. Mare Beltran Primary Care Physician: Sherrie Mustache Other Clinician: Referring Physician: Treating Physician/Extender: Jennye Moccasin in Treatment: 14 Education Assessment Education Provided To: Patient Education Topics  Provided Wound/Skin Impairment: Methods: Explain/Verbal Responses: State content correctly [employer]) Signed: 06/06/2022 10:29:43 AM By: Sharyn Creamer RN, BSN Entered By: Sharyn Creamer on 06/04/2022 15:24:00 -------------------------------------------------------------------------------- Wound Assessment Details Patient Name: Date of Service: Hector Beltran, Hector Beltran 06/04/2022 2:45 PM Medical Record Number: [id number] Patient Account Number: [id number] Date of Birth/Sex: Treating RN: Dec 24, 1927 (88 y.o. Mare Beltran Primary Care Biagio Snelson: Sherrie Mustache Other Clinician: Referring Emory Leaver: Treating Jizelle Conkey/Extender: Jennye Moccasin in Treatment: 14 Wound Status Wound Number: 1 Primary Pressure Ulcer Etiology: Wound Location: Left Calcaneus Wound Healed - Epithelialized Wounding Event: Pressure Injury Status: Date Acquired: 01/08/2022 Comorbid Chronic Obstructive Pulmonary Disease (COPD), Congestive Weeks Of Treatment: 14 History: Heart Failure, Coronary Artery Disease, Hypertension, Cirrhosis , Clustered Wound: No Neuropathy Photos [place], [place] ([id number]) [id number] Page 6 of 9 Wound Measurements Length: (cm) Width: (cm) Depth: (cm) Area: (cm) Volume: (cm) 0 % Reduction in Area: 100% 0 % Reduction in Volume: 100% 0 Epithelialization: Large (67-100%) 0 Tunneling: No 0 Undermining: No Wound Description Classification: Category/Stage III Wound Margin: Distinct, outline attached Exudate Amount: Medium Exudate Type: Serosanguineous Exudate Color: red, brown Foul Odor After Cleansing: No Slough/Fibrino No Wound Bed Granulation Amount: None Present (0%) Exposed Structure Necrotic Amount: None Present (0%) Fascia Exposed: No Fat Layer (Subcutaneous Tissue) Exposed: No Tendon Exposed: No Muscle Exposed: No Joint Exposed: No Bone Exposed: No Treatment Notes Wound #1 (Calcaneus) Wound Laterality:  Left Cleanser Soap and Water Discharge Instruction: Wash with Dial Antibacterial Soap Peri-Wound Care Topical Primary Dressing Hydrofera Blue Ready Foam, 4x5 in Discharge Instruction: Apply to wound bed as instructed Secondary Dressing Woven Gauze Sponges 2x2 in Discharge Instruction: Apply over primary dressing as directed. Secured With Conforming Stretch Gauze Bandage, Sterile 2x75 (in/in) Discharge Instruction: Secure with stretch gauze as directed. 71M Medipore H Soft Cloth Surgical T ape, 4 x 10 (in/yd) Discharge Instruction: Secure with tape as directed. Compression Wrap Compression Stockings Add-Ons Electronic Signature(s) Signed: 06/06/2022 10:29:43 AM By: Sharyn Creamer RN, BSN Entered By: Sharyn Creamer on 06/04/2022 15:31:40 [place], Jerrye Beavers ([id number]) [id number] Page 7 of 9 -------------------------------------------------------------------------------- Wound Assessment Details Patient Name: Date of Service: Hector Beltran 06/04/2022 2:45 PM Medical Record Number: [id number] Patient Account Number: [id number] Date of Birth/Sex: Treating RN: Nov 24, 1927 (88 y.o. Mare Beltran Primary Care Sukari Grist: Sherrie Mustache Other Clinician: Referring Wynton Hufstetler: Treating Lannie Heaps/Extender: Jennye Moccasin in Treatment: 14 Wound Status Wound Number: 2 Primary Pressure Ulcer Etiology: Wound Location: Right T Second oe Wound Open Wounding Event: Blister Status: Date  Acquired: 05/02/2022 Comorbid Chronic Obstructive Pulmonary Disease (COPD), Congestive Weeks Of Treatment: 3 History: Heart Failure, Coronary Artery Disease, Hypertension, Cirrhosis , Clustered Wound: No Neuropathy Photos Wound Measurements Length: (cm) 0.6 Width: (cm) 1.3 Depth: (cm) 0.2 Area: (cm) 0.613 Volume: (cm) 0.123 % Reduction in Area: 42.2% % Reduction in Volume: 42% Epithelialization: None Tunneling: No Undermining: No Wound  Description Classification: Category/Stage III Exudate Amount: Medium Exudate Type: Serosanguineous Exudate Color: red, brown Foul Odor After Cleansing: No Slough/Fibrino Yes Wound Bed Granulation Amount: None Present (0%) Exposed Structure Necrotic Amount: Large (67-100%) Fascia Exposed: No Necrotic Quality: Eschar Fat Layer (Subcutaneous Tissue) Exposed: Yes Tendon Exposed: No Muscle Exposed: No Joint Exposed: No Bone Exposed: No Electronic Signature(s) Signed: 06/04/2022 3:46:40 PM By: Rhae Hammock RN Signed: 06/06/2022 10:29:43 AM By: Sharyn Creamer RN, BSN Entered By: Rhae Hammock on 06/04/2022 15:15:44 [place], Jerrye Beavers ([id number]) [id number] Page 8 of 9 -------------------------------------------------------------------------------- Wound Assessment Details Patient Name: Date of Service: Hector Beltran 06/04/2022 2:45 PM Medical Record Number: [id number] Patient Account Number: [id number] Date of Birth/Sex: Treating RN: 1928-05-24 (88 y.o. Mare Beltran Primary Care Evelina Lore: Sherrie Mustache Other Clinician: Referring Kieren Adkison: Treating Marcas Bowsher/Extender: Jennye Moccasin in Treatment: 14 Wound Status Wound Number: 3 Primary Pressure Ulcer Etiology: Wound Location: Right, Plantar T Second oe Wound Open Wounding Event: Gradually Appeared Status: Date Acquired: 05/28/2022 Comorbid Chronic Obstructive Pulmonary Disease (COPD), Congestive Weeks Of Treatment: 1 History: Heart Failure, Coronary Artery Disease, Hypertension, Cirrhosis , Clustered Wound: No Neuropathy Photos Wound Measurements Length: (cm) 0.3 Width: (cm) 2 Depth: (cm) 0.2 Area: (cm) 0.471 Volume: (cm) 0.094 % Reduction in Area: 70% % Reduction in Volume: 96% Epithelialization: Large (67-100%) Tunneling: No Undermining: No Wound Description Classification: Category/Stage IV Wound Margin: Distinct, outline attached Exudate Amount:  Medium Exudate Type: Serosanguineous Exudate Color: red, brown Foul Odor After Cleansing: No Slough/Fibrino Yes Wound Bed Granulation Amount: Large (67-100%) Exposed Structure Granulation Quality: Red, Pink Fascia Exposed: No Necrotic Amount: Small (1-33%) Fat Layer (Subcutaneous Tissue) Exposed: Yes Necrotic Quality: Adherent Slough Tendon Exposed: Yes Muscle Exposed: No Joint Exposed: No Bone Exposed: Yes Electronic Signature(s) Signed: 06/04/2022 3:46:40 PM By: Rhae Hammock RN Signed: 06/06/2022 10:29:43 AM By: Sharyn Creamer RN, BSN Entered By: Rhae Hammock on 06/04/2022 15:16:21 -------------------------------------------------------------------------------- Vitals Details Patient Name: Date of Service: Hector Beltran, [name] 06/04/2022 2:45 PM Medical Record Number: [id number] Patient Account Number: [id number] Date of Birth/Sex: Treating RN: [birth year] (88 y.o. Mare Beltran Primary Care Makael Stein: Sherrie Mustache Other Clinician: BERLYN, MALINA ([id number]) [id number] Page 9 of 9 Referring Ahnya Akre: Treating Edda Orea/Extender: Jennye Moccasin in Treatment: 14 Vital Signs Time Taken: 15:00 Temperature (F): 98.0 Height (in): 75 Pulse (bpm): 83 Weight (lbs): 192 Respiratory Rate (breaths/min): 18 Body Mass Index (BMI): 24 Blood Pressure (mmHg): 167/70 Reference Range: 80 - 120 mg / dl Electronic Signature(s) Signed: 06/06/2022 10:29:43 AM By: Sharyn Creamer RN, BSN Entered By: Sharyn Creamer on 06/04/2022 15:01:47

## 2022-06-12 ENCOUNTER — Ambulatory Visit (HOSPITAL_BASED_OUTPATIENT_CLINIC_OR_DEPARTMENT_OTHER): Payer: Medicare PPO

## 2022-06-12 ENCOUNTER — Other Ambulatory Visit: Payer: Self-pay

## 2022-06-12 ENCOUNTER — Encounter (HOSPITAL_BASED_OUTPATIENT_CLINIC_OR_DEPARTMENT_OTHER): Payer: Self-pay | Admitting: Orthopedic Surgery

## 2022-06-12 ENCOUNTER — Ambulatory Visit (HOSPITAL_COMMUNITY)
Admission: RE | Admit: 2022-06-12 | Discharge: 2022-06-12 | Disposition: A | Discharge status: Home / Self Care | Payer: Medicare PPO | Attending: Orthopedic Surgery | Admitting: Orthopedic Surgery

## 2022-06-12 ENCOUNTER — Ambulatory Visit (HOSPITAL_BASED_OUTPATIENT_CLINIC_OR_DEPARTMENT_OTHER): Payer: Medicare PPO | Admitting: Certified Registered"

## 2022-06-12 ENCOUNTER — Encounter (HOSPITAL_BASED_OUTPATIENT_CLINIC_OR_DEPARTMENT_OTHER)
Admission: RE | Disposition: A | Discharge status: Home / Self Care | Payer: Self-pay | Source: Home / Self Care | Attending: Orthopedic Surgery

## 2022-06-12 DIAGNOSIS — Z87891 Personal history of nicotine dependence: Secondary | ICD-10-CM | POA: Insufficient documentation

## 2022-06-12 DIAGNOSIS — L97519 Non-pressure chronic ulcer of other part of right foot with unspecified severity: Secondary | ICD-10-CM | POA: Diagnosis not present

## 2022-06-12 DIAGNOSIS — J449 Chronic obstructive pulmonary disease, unspecified: Secondary | ICD-10-CM | POA: Diagnosis not present

## 2022-06-12 DIAGNOSIS — G629 Polyneuropathy, unspecified: Secondary | ICD-10-CM | POA: Insufficient documentation

## 2022-06-12 DIAGNOSIS — L97516 Non-pressure chronic ulcer of other part of right foot with bone involvement without evidence of necrosis: Secondary | ICD-10-CM | POA: Insufficient documentation

## 2022-06-12 DIAGNOSIS — I509 Heart failure, unspecified: Secondary | ICD-10-CM

## 2022-06-12 DIAGNOSIS — L97529 Non-pressure chronic ulcer of other part of left foot with unspecified severity: Secondary | ICD-10-CM | POA: Diagnosis not present

## 2022-06-12 DIAGNOSIS — I472 Ventricular tachycardia, unspecified: Secondary | ICD-10-CM | POA: Insufficient documentation

## 2022-06-12 DIAGNOSIS — L03115 Cellulitis of right lower limb: Secondary | ICD-10-CM

## 2022-06-12 DIAGNOSIS — M869 Osteomyelitis, unspecified: Secondary | ICD-10-CM | POA: Insufficient documentation

## 2022-06-12 DIAGNOSIS — A498 Other bacterial infections of unspecified site: Secondary | ICD-10-CM | POA: Diagnosis not present

## 2022-06-12 DIAGNOSIS — Z01818 Encounter for other preprocedural examination: Secondary | ICD-10-CM

## 2022-06-12 HISTORY — PX: AMPUTATION TOE: SHX6595

## 2022-06-12 HISTORY — DX: Chronic obstructive pulmonary disease, unspecified: J44.9

## 2022-06-12 SURGERY — AMPUTATION, TOE
Anesthesia: Monitor Anesthesia Care | Site: Toe | Laterality: Right

## 2022-06-12 MED ORDER — ONDANSETRON HCL 4 MG/2ML IJ SOLN
INTRAMUSCULAR | Status: AC
  Filled 2022-06-12: qty 14

## 2022-06-12 MED ORDER — BUPIVACAINE-EPINEPHRINE 0.5% -1:200000 IJ SOLN
INTRAMUSCULAR | Status: DC | PRN
  Administered 2022-06-12: 10 mL

## 2022-06-12 MED ORDER — LIDOCAINE 2% (20 MG/ML) 5 ML SYRINGE
INTRAMUSCULAR | Status: AC
  Filled 2022-06-12: qty 15

## 2022-06-12 MED ORDER — FENTANYL CITRATE (PF) 100 MCG/2ML IJ SOLN: 25.0000 ug | INTRAMUSCULAR | Status: DC | PRN

## 2022-06-12 MED ORDER — EPHEDRINE 5 MG/ML INJ
INTRAVENOUS | Status: AC
  Filled 2022-06-12: qty 20

## 2022-06-12 MED ORDER — LINEZOLID 600 MG PO TABS: 600.0000 mg | ORAL_TABLET | Freq: Two times a day (BID) | ORAL | 0 refills | Status: AC

## 2022-06-12 MED ORDER — DEXAMETHASONE SODIUM PHOSPHATE 10 MG/ML IJ SOLN
INTRAMUSCULAR | Status: AC
  Filled 2022-06-12: qty 1

## 2022-06-12 MED ORDER — PROPOFOL 500 MG/50ML IV EMUL
INTRAVENOUS | Status: AC
  Filled 2022-06-12: qty 150

## 2022-06-12 MED ORDER — FENTANYL CITRATE (PF) 100 MCG/2ML IJ SOLN
INTRAMUSCULAR | Status: AC
  Filled 2022-06-12: qty 2

## 2022-06-12 MED ORDER — LACTATED RINGERS IV SOLN: INTRAVENOUS | Status: DC

## 2022-06-12 MED ORDER — ONDANSETRON HCL 4 MG/2ML IJ SOLN: 4.0000 mg | Freq: Once | INTRAMUSCULAR | Status: DC | PRN

## 2022-06-12 MED ORDER — PHENYLEPHRINE 80 MCG/ML (10ML) SYRINGE FOR IV PUSH (FOR BLOOD PRESSURE SUPPORT)
PREFILLED_SYRINGE | INTRAVENOUS | Status: AC
  Filled 2022-06-12: qty 20

## 2022-06-12 MED ORDER — ONDANSETRON HCL 4 MG/2ML IJ SOLN
INTRAMUSCULAR | Status: DC | PRN
  Administered 2022-06-12: 4 mg via INTRAVENOUS

## 2022-06-12 MED ORDER — PROPOFOL 500 MG/50ML IV EMUL
INTRAVENOUS | Status: DC | PRN
  Administered 2022-06-12: 75 ug/kg/min via INTRAVENOUS

## 2022-06-12 MED ORDER — ACETAMINOPHEN 500 MG PO TABS
1000.0000 mg | ORAL_TABLET | Freq: Once | ORAL | Status: AC
  Administered 2022-06-12: 1000 mg via ORAL

## 2022-06-12 MED ORDER — ACETAMINOPHEN 500 MG PO TABS
ORAL_TABLET | ORAL | Status: AC
  Filled 2022-06-12: qty 2

## 2022-06-12 MED ORDER — LIDOCAINE 2% (20 MG/ML) 5 ML SYRINGE
INTRAMUSCULAR | Status: AC
  Filled 2022-06-12: qty 10

## 2022-06-12 MED ORDER — 0.9 % SODIUM CHLORIDE (POUR BTL) OPTIME
TOPICAL | Status: DC | PRN
  Administered 2022-06-12: 50 mL

## 2022-06-12 MED ORDER — VANCOMYCIN HCL 500 MG IV SOLR
INTRAVENOUS | Status: DC | PRN
  Administered 2022-06-12: 500 mg

## 2022-06-12 MED ORDER — CEFAZOLIN SODIUM 1 G IJ SOLR
INTRAMUSCULAR | Status: AC
  Filled 2022-06-12: qty 20

## 2022-06-12 MED ORDER — LIDOCAINE 2% (20 MG/ML) 5 ML SYRINGE
INTRAMUSCULAR | Status: DC | PRN
  Administered 2022-06-12: 60 mg via INTRAVENOUS

## 2022-06-12 MED ORDER — CEFAZOLIN SODIUM-DEXTROSE 2-3 GM-%(50ML) IV SOLR
INTRAVENOUS | Status: DC | PRN
  Administered 2022-06-12: 2 g via INTRAVENOUS

## 2022-06-12 MED ORDER — FENTANYL CITRATE (PF) 100 MCG/2ML IJ SOLN
INTRAMUSCULAR | Status: DC | PRN
  Administered 2022-06-12: 25 ug via INTRAVENOUS

## 2022-06-12 SURGICAL SUPPLY — 59 items
APL PRP STRL LF DISP 70% ISPRP (MISCELLANEOUS) ×1
BLADE AVERAGE 25X9 (BLADE) IMPLANT
BLADE OSC/SAG .038X5.5 CUT EDG (BLADE) IMPLANT
BLADE SURG 10 STRL SS (BLADE) IMPLANT
BLADE SURG 15 STRL LF DISP TIS (BLADE) ×1 IMPLANT
BLADE SURG 15 STRL SS (BLADE) ×1
BNDG CMPR 9X4 STRL LF SNTH (GAUZE/BANDAGES/DRESSINGS) ×1
BNDG ELASTIC 4X5.8 VLCR STR LF (GAUZE/BANDAGES/DRESSINGS) ×1 IMPLANT
BNDG ESMARK 4X9 LF (GAUZE/BANDAGES/DRESSINGS) ×1 IMPLANT
BNDG GZE 12X3 1 PLY HI ABS (GAUZE/BANDAGES/DRESSINGS)
BNDG STRETCH GAUZE 3IN X12FT (GAUZE/BANDAGES/DRESSINGS) IMPLANT
CHLORAPREP W/TINT 26 (MISCELLANEOUS) ×1 IMPLANT
COVER BACK TABLE 60X90IN (DRAPES) ×1 IMPLANT
CUFF TOURN SGL QUICK 24 (TOURNIQUET CUFF)
CUFF TRNQT CYL 24X4X16.5-23 (TOURNIQUET CUFF) IMPLANT
DRAPE EXTREMITY T 121X128X90 (DISPOSABLE) ×1 IMPLANT
DRAPE SURG 17X23 STRL (DRAPES) ×1 IMPLANT
DRAPE U-SHAPE 47X51 STRL (DRAPES) ×1 IMPLANT
DRSG EMULSION OIL 3X3 NADH (GAUZE/BANDAGES/DRESSINGS) IMPLANT
DRSG MEPITEL 4X7.2 (GAUZE/BANDAGES/DRESSINGS) ×1 IMPLANT
ELECT REM PT RETURN 9FT ADLT (ELECTROSURGICAL) ×1
ELECTRODE REM PT RTRN 9FT ADLT (ELECTROSURGICAL) ×1 IMPLANT
GAUZE PAD ABD 8X10 STRL (GAUZE/BANDAGES/DRESSINGS) IMPLANT
GAUZE SPONGE 4X4 12PLY STRL (GAUZE/BANDAGES/DRESSINGS) ×1 IMPLANT
GLOVE BIO SURGEON STRL SZ8 (GLOVE) ×1 IMPLANT
GLOVE BIOGEL PI IND STRL 8 (GLOVE) ×2 IMPLANT
GLOVE ECLIPSE 8.0 STRL XLNG CF (GLOVE) ×1 IMPLANT
GOWN STRL REUS W/ TWL LRG LVL3 (GOWN DISPOSABLE) ×1 IMPLANT
GOWN STRL REUS W/ TWL XL LVL3 (GOWN DISPOSABLE) ×2 IMPLANT
GOWN STRL REUS W/TWL LRG LVL3 (GOWN DISPOSABLE) ×1
GOWN STRL REUS W/TWL XL LVL3 (GOWN DISPOSABLE) ×2
NDL HYPO 25X1 1.5 SAFETY (NEEDLE) IMPLANT
NDL SAFETY ECLIP 18X1.5 (MISCELLANEOUS) IMPLANT
NEEDLE HYPO 25X1 1.5 SAFETY (NEEDLE) IMPLANT
NS IRRIG 1000ML POUR BTL (IV SOLUTION) ×1 IMPLANT
PACK BASIN DAY SURGERY FS (CUSTOM PROCEDURE TRAY) ×1 IMPLANT
PAD CAST 4YDX4 CTTN HI CHSV (CAST SUPPLIES) ×1 IMPLANT
PADDING CAST COTTON 4X4 STRL (CAST SUPPLIES) ×1
PENCIL SMOKE EVACUATOR (MISCELLANEOUS) ×1 IMPLANT
SANITIZER HAND PURELL 535ML FO (MISCELLANEOUS) ×1 IMPLANT
SHEET MEDIUM DRAPE 40X70 STRL (DRAPES) ×1 IMPLANT
SLEEVE SCD COMPRESS KNEE MED (STOCKING) ×1 IMPLANT
SPIKE FLUID TRANSFER (MISCELLANEOUS) IMPLANT
SPONGE T-LAP 18X18 ~~LOC~~+RFID (SPONGE) ×1 IMPLANT
STOCKINETTE 6  STRL (DRAPES) ×1
STOCKINETTE 6 STRL (DRAPES) ×1 IMPLANT
SUCTION FRAZIER HANDLE 10FR (MISCELLANEOUS)
SUCTION TUBE FRAZIER 10FR DISP (MISCELLANEOUS) IMPLANT
SUT ETHILON 2 0 FS 18 (SUTURE) IMPLANT
SUT ETHILON 2 0 FSLX (SUTURE) IMPLANT
SUT ETHILON 3 0 PS 1 (SUTURE) IMPLANT
SUT MNCRL AB 3-0 PS2 18 (SUTURE) IMPLANT
SWAB COLLECTION DEVICE MRSA (MISCELLANEOUS) IMPLANT
SWAB CULTURE ESWAB REG 1ML (MISCELLANEOUS) IMPLANT
SYR BULB EAR ULCER 3OZ GRN STR (SYRINGE) ×1 IMPLANT
SYR CONTROL 10ML LL (SYRINGE) IMPLANT
TOWEL GREEN STERILE FF (TOWEL DISPOSABLE) ×1 IMPLANT
TUBE CONNECTING 20X1/4 (TUBING) IMPLANT
UNDERPAD 30X36 HEAVY ABSORB (UNDERPADS AND DIAPERS) ×1 IMPLANT

## 2022-06-12 NOTE — Anesthesia Preprocedure Evaluation (Signed)
Anesthesia Evaluation  Patient identified by MRN, date of birth, ID band Patient awake    Reviewed: Allergy & Precautions, NPO status , Patient's Chart, lab work & pertinent test results  Airway Mallampati: II  TM Distance: >3 FB Neck ROM: Full    Dental  (+) Teeth Intact, Dental Advisory Given, Caps   Pulmonary COPD, former smoker,    Pulmonary exam normal breath sounds clear to auscultation       Cardiovascular +CHF  Normal cardiovascular exam+ dysrhythmias Supra Ventricular Tachycardia  Rhythm:Regular Rate:Normal     Neuro/Psych  Neuromuscular disease    GI/Hepatic negative GI ROS, Neg liver ROS,   Endo/Other  negative endocrine ROS  Renal/GU negative Renal ROS     Musculoskeletal negative musculoskeletal ROS (+) osteomyelitis of right foot   Abdominal   Peds  Hematology negative hematology ROS (+)   Anesthesia Other Findings Day of surgery medications reviewed with the patient.  Reproductive/Obstetrics                             Anesthesia Physical Anesthesia Plan  ASA: 2  Anesthesia Plan: MAC   Post-op Pain Management: Tylenol PO (pre-op)*   Induction: Intravenous  PONV Risk Score and Plan: 1 and TIVA and Treatment may vary due to age or medical condition  Airway Management Planned: Nasal Cannula and Natural Airway  Additional Equipment:   Intra-op Plan:   Post-operative Plan:   Informed Consent: I have reviewed the patients History and Physical, chart, labs and discussed the procedure including the risks, benefits and alternatives for the proposed anesthesia with the patient or authorized representative who has indicated his/her understanding and acceptance.     Dental advisory given  Plan Discussed with: CRNA and Anesthesiologist  Anesthesia Plan Comments:         Anesthesia Quick Evaluation

## 2022-06-12 NOTE — Anesthesia Postprocedure Evaluation (Signed)
Anesthesia Post Note  Patient: Hector Beltran  Procedure(s) Performed: RIGHT 2ND AMPUTATION TOE, POSSIBLE RAY AMPUTATION (Right: Toe)     Patient location during evaluation: PACU Anesthesia Type: MAC Level of consciousness: awake and alert Pain management: pain level controlled Vital Signs Assessment: post-procedure vital signs reviewed and stable Respiratory status: spontaneous breathing, nonlabored ventilation, respiratory function stable and patient connected to nasal cannula oxygen Cardiovascular status: stable and blood pressure returned to baseline Postop Assessment: no apparent nausea or vomiting Anesthetic complications: no   No notable events documented.  Last Vitals:  Vitals:   06/12/22 1145 06/12/22 1219  BP: (!) 142/76 (!) 106/94  Pulse: 74 73  Resp: 20 16  Temp: 36.4 C (!) 36.4 C  SpO2: 92% 93%    Last Pain:  Vitals:   06/12/22 1219  TempSrc:   PainSc: 0-No pain                 Santa Lighter

## 2022-06-12 NOTE — Transfer of Care (Signed)
Immediate Anesthesia Transfer of Care Note  Patient: Hector Beltran  Procedure(s) Performed: RIGHT 2ND AMPUTATION TOE, POSSIBLE RAY AMPUTATION (Right: Toe)  Patient Location: PACU  Anesthesia Type:MAC  Level of Consciousness: awake, alert , oriented and patient cooperative  Airway & Oxygen Therapy: Patient Spontanous Breathing and Patient connected to face mask oxygen  Post-op Assessment: Report given to RN and Post -op Vital signs reviewed and stable  Post vital signs: Reviewed and stable  Last Vitals:  Vitals Value Taken Time  BP    Temp    Pulse 77 06/12/22 1110  Resp    SpO2 98 % 06/12/22 1110  Vitals shown include unvalidated device data.  Last Pain:  Vitals:   06/12/22 0908  TempSrc: Oral  PainSc: 0-No pain      Patients Stated Pain Goal: 3 (57/90/38 3338)  Complications: No notable events documented.

## 2022-06-12 NOTE — H&P (Signed)
Hector Beltran is an 88 y.o. male.   Chief Complaint: Right foot second toe ulcer HPI: 88 year old male with a past medical history significant for peripheral neuropathy and a second hammertoe deformity.  He has been treated at the wound center for a chronic ulcer of the second toe.  Recently the physician at the wound center identified exposed bone at the wound.  This was confirmed in my clinic.  The patient presents now for right foot second toe amputation.  He is currently taking Bactrim twice a day.  Past Medical History:  Diagnosis Date   Cellulitis of right foot 06/04/2022   Constipation    COPD (chronic obstructive pulmonary disease) ([place])    COVID 05/27/2021   Gait abnormality 02/09/2020   Idiopathic neuropathy 06/04/2022   Neuropathy    Osteomyelitis of second toe of right foot ([place]) 06/04/2022   Peripheral neuropathy 09/30/2017   SVT (supraventricular tachycardia) [facility])     Past Surgical History:  Procedure Laterality Date   CYST REMOVAL NECK     HIP ARTHROPLASTY Left 12/24/2021   Procedure: ARTHROPLASTY BIPOLAR HIP (HEMIARTHROPLASTY);  Surgeon: [name], MD;  Location: [facility];  Service: Orthopedics;  Laterality: Left;    History reviewed. No pertinent family history. Social History:  reports that he quit smoking about 21 months ago. His smoking use included cigarettes. He has a 12.50 pack-year smoking history. He has never used smokeless tobacco. He reports current alcohol use of about 3.0 standard drinks of alcohol per week. He reports that he does not use drugs.  Allergies: No Known Allergies  Medications Prior to Admission  Medication Sig Dispense Refill   B Complex Vitamins (B COMPLEX PO) Take 1 tablet by mouth daily.     Coenzyme Q10 (CO Q-10 PO) Take 1 capsule by mouth daily.     Omega-3 Fatty Acids (OMEGA 3 PO) Take 1 capsule by mouth daily.     triamcinolone cream (KENALOG) 0.1 % Apply 1 Application topically 2 (two) times daily. 30 g 0   nystatin ointment  (MYCOSTATIN) Apply 1 Application topically 2 (two) times daily. (Patient not taking: Reported on 06/04/2022) 30 g 0   sulfamethoxazole-trimethoprim (BACTRIM DS) 800-160 MG tablet Take 1 tablet by mouth 2 (two) times daily. Dose unknown- patient will need to confirm (Patient not taking: Reported on 06/04/2022)      No results found for this or any previous visit (from the past 48 hour(s)). DG MINI C-ARM IMAGE ONLY  Result Date: 06/12/2022 There is no interpretation for this exam.  This order is for images obtained during a surgical procedure.  Please See "Surgeries" Tab for more information regarding the procedure.    Review of Systems no recent fever, chills, nausea, vomiting or changes in his appetite  Blood pressure (!) 148/86, pulse 72, temperature 97.8 F (36.6 C), temperature source Oral, resp. rate 18, height 6\' 3"  (1.905 m), weight 87.5 kg, SpO2 97 %. Physical Exam  Well-nourished well-developed elderly man in no apparent distress.  Alert and oriented x4.  Normal mood and affect.  Extraocular motions are intact.  Slightly hard of hearing.  Right foot second toe has a rigid hammertoe deformity.  Skin is intact today.  There is a small ulcer dorsally over the PIP joint.  Brisk capillary refill in the toe.  Intact but diminished sensibility to light touch dorsally and plantarly at the forefoot.   Assessment/Plan Right foot second toe ulcer and osteomyelitis -to the operating room today for second toe amputation.  The risks and benefits of the alternative treatment options have been discussed in detail.  The patient wishes to proceed with surgery and specifically understands risks of bleeding, infection, nerve damage, blood clots, need for additional surgery, amputation and death.   Toni Arthurs, MD 06/25/2022, 10:18 AM

## 2022-06-12 NOTE — Op Note (Signed)
06/12/2022  11:11 AM  PATIENT:  Hector Beltran  88 y.o. male  PRE-OPERATIVE DIAGNOSIS:  ulcer and osteomyelitis of right second toe  POST-OPERATIVE DIAGNOSIS:  same  Procedure(s): Right foot second toe amputation through the MTP joint  SURGEON:  Wylene Simmer, MD  ASSISTANT: None  ANESTHESIA:   Local, MAC  EBL:  minimal   TOURNIQUET: Less than 10 minutes with an ankle Esmarch  COMPLICATIONS:  None apparent  DISPOSITION:  Extubated, awake and stable to recovery.  INDICATION FOR PROCEDURE: The patient is a 88 year old male with a past medical history significant for peripheral neuropathy.  He has a severe second hammertoe deformity and has developed a chronic ulcer plantar to the proximal phalanx at the PIP joint.  This wound has been noted to probe to bone on 2 different occasions.  He has had episodes of infection of the second toe presents now for second toe amputation.  He is on Bactrim twice a day.  The risks and benefits of the alternative treatment options have been discussed in detail.  The patient wishes to proceed with surgery and specifically understands risks of bleeding, infection, nerve damage, blood clots, need for additional surgery, amputation and death.   PROCEDURE IN DETAIL: After preoperative consent was obtained and the correct operative site was identified, the patient was brought to the operating room and placed supine on the operating table.  Preoperative antibiotics were held pending cultures.  IV sedation was administered.  A surgical timeout was taken.  An intermetatarsal block was then performed with half percent Marcaine with epinephrine at the second metatarsal.  The right foot was then prepped and draped in standard sterile fashion.  An Esmarch bandage was used to exsanguinate the foot and was then wrapped around the ankle as a tourniquet.  An incision was made from the MTP joint in a racquet style and around the proximal phalanx.  Subperiosteal dissection was  then carried along the proximal phalanx and the toe was disarticulated through the MTP joint.  Aerobic and anaerobic cultures were obtained at the operative site.  Perioperative antibiotics were then administered intravenously.  There was no evidence of abscess or nonviable tissue.  The wound was irrigated copiously.  The tourniquet was released and hemostasis was achieved.  The wound was sprinkled with vancomycin powder.  Incision was closed with horizontal mattress sutures of 3-0 nylon.  Sterile dressings were applied followed by a compression wrap.  The patient was awakened from anesthesia and transported to the recovery room in stable condition.  FOLLOW UP PLAN: Zyvox 600 mg twice a day for 10 days per Dr. Tommy Medal.  Weightbearing as tolerated.  Follow-up in the office in 2 to 3 weeks for suture removal.  Follow-up in infectious disease as previously scheduled.  No indication for DVT prophylaxis in this ambulatory patient.

## 2022-06-12 NOTE — Discharge Instructions (Addendum)
Hector Simmer, MD [facility]  Please read the following information regarding your care after surgery.  Medications  You only need a prescription for the narcotic pain medicine (ex. oxycodone, Percocet, Norco).  All of the other medicines listed below are available over the counter. X acetominophen (Tylenol) 650 mg every 4-6 hours as you need for minor to moderate pain  Weight Bearing X Bear weight when you are able on your operated leg or foot.  Cast / Splint / Dressing X Keep your splint, cast or dressing clean and dry.  Don't put anything (coat hanger, pencil, etc) down inside of it.  If it gets damp, use a hair dryer on the cool setting to dry it.  If it gets soaked, call the office to schedule an appointment for a cast change.  After your dressing, cast or splint is removed; you may shower, but do not soak or scrub the wound.  Allow the water to run over it, and then gently pat it dry.  Swelling It is normal for you to have swelling where you had surgery.  To reduce swelling and pain, keep your toes above your nose for at least 3 days after surgery.  It may be necessary to keep your foot or leg elevated for several weeks.  If it hurts, it should be elevated.  Follow Up Call my office at [phone] when you are discharged from the hospital or surgery center to schedule an appointment to be seen two weeks after surgery.  Call my office at [phone] if you develop a fever >101.5 F, nausea, vomiting, bleeding from the surgical site or severe pain.      No Tylenol until 3:15 today if needed.   Post Anesthesia Home Care Instructions  Activity: Get plenty of rest for the remainder of the day. A responsible individual must stay with you for 24 hours following the procedure.  For the next 24 hours, DO NOT: -Drive a car -Paediatric nurse -Drink alcoholic beverages -Take any medication unless instructed by your physician -Make any legal decisions or sign important  papers.  Meals: Start with liquid foods such as gelatin or soup. Progress to regular foods as tolerated. Avoid greasy, spicy, heavy foods. If nausea and/or vomiting occur, drink only clear liquids until the nausea and/or vomiting subsides. Call your physician if vomiting continues.  Special Instructions/Symptoms: Your throat may feel dry or sore from the anesthesia or the breathing tube placed in your throat during surgery. If this causes discomfort, gargle with warm salt water. The discomfort should disappear within 24 hours.

## 2022-06-13 ENCOUNTER — Telehealth: Payer: Self-pay | Admitting: Infectious Disease

## 2022-06-13 ENCOUNTER — Encounter (HOSPITAL_BASED_OUTPATIENT_CLINIC_OR_DEPARTMENT_OTHER): Payer: Self-pay | Admitting: Orthopedic Surgery

## 2022-06-13 MED ORDER — CIPROFLOXACIN HCL 500 MG PO TABS: 500.0000 mg | ORAL_TABLET | Freq: Two times a day (BID) | ORAL | 0 refills | Status: AC

## 2022-06-13 NOTE — Telephone Encounter (Signed)
Patient's operative cutlure is growing pseudomonas He is currently on zyvox which would be great for MRSA but no activity vs pseudomonas  I do not have sensitivity data back yet but prior pseudomonas isolate was S to cipro   I can send in rx for cipro empirically while we await cultures though there are particular risks in the elderly including confusion, c diff colitis so I would like this to be a joint informed decision  I would not want to start IV abx as part of his  "Mop up antibiotics" unless absolutely necessary

## 2022-06-13 NOTE — Telephone Encounter (Signed)
Discussed risks and benefits of cipro which I have subsequently sent in electronically after extensive discussion with Pilar Plate

## 2022-06-17 ENCOUNTER — Encounter: Payer: Self-pay | Admitting: Infectious Disease

## 2022-06-17 ENCOUNTER — Other Ambulatory Visit: Payer: Self-pay

## 2022-06-17 ENCOUNTER — Ambulatory Visit (INDEPENDENT_AMBULATORY_CARE_PROVIDER_SITE_OTHER): Payer: Medicare PPO | Admitting: Infectious Disease

## 2022-06-17 VITALS — BP 150/88 | HR 71 | Temp 97.3°F

## 2022-06-17 DIAGNOSIS — M869 Osteomyelitis, unspecified: Secondary | ICD-10-CM

## 2022-06-17 DIAGNOSIS — A498 Other bacterial infections of unspecified site: Secondary | ICD-10-CM

## 2022-06-17 DIAGNOSIS — G609 Hereditary and idiopathic neuropathy, unspecified: Secondary | ICD-10-CM | POA: Diagnosis not present

## 2022-06-17 HISTORY — DX: Other bacterial infections of unspecified site: A49.8

## 2022-06-17 LAB — AEROBIC/ANAEROBIC CULTURE W GRAM STAIN (SURGICAL/DEEP WOUND): Gram Stain: NONE SEEN

## 2022-06-17 NOTE — Progress Notes (Signed)
Complaint follow-up for osteomyelitis of toe status post amputation by Dr. Victorino Dike  Subjective:    Patient ID: Hector Beltran, male    DOB: 05/22/1928, 88 y.o.   MRN: [id number]  HPI  Hector Beltran is a 88 year old Caucasian man with a history of supraventricular tachycardia coronary disease CHF, COPD peripheral neuropathy of unknown cause who resides in [facility] independent living.  While he is able to walk he spends quite a bit of time in a wheelchair.  Developed ulcers on his left heel that developed after left hip fracture hospitalization in April 2023.  Left wound healed up with close follow-up with wound care though there was no wound care at his facility that could perform dressing changes.  He then developed an ulcer involving the right second to which family tell me he is believed to partially a consequence of a constrictive bandage which was wrapped around that toe.   Wound extended to the level of tendon and bone.  Was seen in wound care and had a swab sent for culture which yielded MRSA Pseudomonas aeruginosa and E. coli.  He was sent to the emergency department for evaluation.  He was given a dose of Vantin and referred to our clinic.  The referring diagnosis was cellulitis though I cannot find evidence that the patient actually has cellulitis but does have indeed osteomyelitis.  He has been evaluated by by Dr. Victorino Dike and is scheduled for surgery on June 12, 2022.  His wound appeared stable and he has no evidence of a systemic infection on exam.  He was seen by Dr. Garey Ham who arranged for surgery.  Is taken the operating room on June 12, 2022 and underwent right second toe amputation through the metatarsal phalangeal joint.  Intraoperative cultures were taken and he was discharged on Zyvox.  Cultures subsequently grew Pseudomonas aeruginosa that was sensitive to fluoroquinolones I noted the growth of Pseudomonas on culture on Friday and initiated ciprofloxacin  twice daily which she has been taking since then.  A relative had with his sock and noticed some blood on the dressing so they asked me to take a look today.  I found no evidence of active bleeding on exam today and I did not take down the operative dressing.     Past Medical History:  Diagnosis Date   Cellulitis of right foot 06/04/2022   Constipation    COPD (chronic obstructive pulmonary disease) ([facility])    COVID 05/27/2021   Gait abnormality 02/09/2020   Idiopathic neuropathy 06/04/2022   Neuropathy    Osteomyelitis of second toe of right foot ([facility]) 06/04/2022   Peripheral neuropathy 09/30/2017   SVT (supraventricular tachycardia)     Past Surgical History:  Procedure Laterality Date   AMPUTATION TOE Right 06/12/2022   Procedure: RIGHT 2ND AMPUTATION TOE, POSSIBLE RAY AMPUTATION;  Surgeon: Toni Arthurs, MD;  Location: [facility];  Service: Orthopedics;  Laterality: Right;  2nd toe amputation discarded per surgeon's VO   CYST REMOVAL NECK     HIP ARTHROPLASTY Left 12/24/2021   Procedure: ARTHROPLASTY BIPOLAR HIP (HEMIARTHROPLASTY);  Surgeon: Eldred Manges, MD;  Location: [facility];  Service: Orthopedics;  Laterality: Left;    No family history on file.    Social History   Socioeconomic History   Marital status: Widowed    Spouse name: Not on file   Number of children: 2   Years of education: College   Highest education level: Not on file  Occupational History  Occupation: Retired  Tobacco Use   Smoking status: Former    Packs/day: 0.25    Years: 50.00    Total pack years: 12.50    Types: Cigarettes    Quit date: 08/15/2020    Years since quitting: 1.8   Smokeless tobacco: Never   Tobacco comments:    2 packs per week, quit 08/2020  Vaping Use   Vaping Use: Never used  Substance and Sexual Activity   Alcohol use: Yes    Alcohol/week: 3.0 standard drinks of alcohol    Types: 1 Glasses of wine, 1 Cans of beer, 1 Shots of liquor per week    Comment:  rarely   Drug use: No   Sexual activity: Not on file  Other Topics Concern   Not on file  Social History Narrative   Lives alone at [place]   Caffeine use: Coffee daily   Right handed    Social Determinants of Health   Financial Resource Strain: Not on file  Food Insecurity: Not on file  Transportation Needs: Not on file  Physical Activity: Not on file  Stress: Not on file  Social Connections: Not on file    No Known Allergies   Current Outpatient Medications:    B Complex Vitamins (B COMPLEX PO), Take 1 tablet by mouth daily., Disp: , Rfl:    ciprofloxacin (CIPRO) 500 MG tablet, Take 1 tablet (500 mg total) by mouth 2 (two) times daily for 14 days., Disp: 28 tablet, Rfl: 0   Coenzyme Q10 (CO Q-10 PO), Take 1 capsule by mouth daily., Disp: , Rfl:    linezolid (ZYVOX) 600 MG tablet, Take 1 tablet (600 mg total) by mouth 2 (two) times daily for 10 days., Disp: 20 tablet, Rfl: 0   nystatin ointment (MYCOSTATIN), Apply 1 Application topically 2 (two) times daily., Disp: 30 g, Rfl: 0   Omega-3 Fatty Acids (OMEGA 3 PO), Take 1 capsule by mouth daily., Disp: , Rfl:    triamcinolone cream (KENALOG) 0.1 %, Apply 1 Application topically 2 (two) times daily., Disp: 30 g, Rfl: 0   Review of Systems  Constitutional:  Negative for activity change, appetite change, chills, diaphoresis, fatigue, fever and unexpected weight change.  HENT:  Negative for congestion, rhinorrhea, sinus pressure, sneezing, sore throat and trouble swallowing.   Eyes:  Negative for photophobia and visual disturbance.  Respiratory:  Negative for cough, chest tightness, shortness of breath, wheezing and stridor.   Cardiovascular:  Negative for chest pain, palpitations and leg swelling.  Gastrointestinal:  Negative for abdominal distention, abdominal pain, anal bleeding, blood in stool, constipation, diarrhea, nausea and vomiting.  Genitourinary:  Negative for difficulty urinating, dysuria, flank pain and hematuria.   Musculoskeletal:  Negative for arthralgias, back pain, gait problem, joint swelling and myalgias.  Skin:  Positive for wound. Negative for color change, pallor and rash.  Neurological:  Positive for numbness. Negative for dizziness, tremors, weakness and light-headedness.  Hematological:  Negative for adenopathy. Does not bruise/bleed easily.  Psychiatric/Behavioral:  Negative for agitation, behavioral problems, confusion, decreased concentration, dysphoric mood and sleep disturbance.        Objective:   Physical Exam Constitutional:      Appearance: He is well-developed.  HENT:     Head: Normocephalic and atraumatic.  Eyes:     Conjunctiva/sclera: Conjunctivae normal.  Cardiovascular:     Rate and Rhythm: Normal rate and regular rhythm.  Pulmonary:     Effort: Pulmonary effort is normal. No respiratory distress.  Breath sounds: No wheezing.  Abdominal:     General: There is no distension.     Palpations: Abdomen is soft.  Musculoskeletal:        General: No tenderness. Normal range of motion.     Cervical back: Normal range of motion and neck supple.  Skin:    General: Skin is warm and dry.     Coloration: Skin is not pale.     Findings: No erythema or rash.  Neurological:     Mental Status: He is alert and oriented to person, place, and time.  Psychiatric:        Mood and Affect: Mood normal.        Behavior: Behavior normal.        Thought Content: Thought content normal.        Judgment: Judgment normal.     Left foot 06/04/2022:       Right toe 06/04/2022:    Right foot 06/17/2022:       Assessment & Plan:   Osteomyelitis of second toe status post amputation at the metatarsophalangeal joint:  Pseudomonas aeruginosa was isolated on culture.  The patient was already initiated on Zyvox but I added ciprofloxacin to cover the Pseudomonas.  He should finish out his antibiotics and follow-up with Dr. Doran Durand.  He should not need to come back and see  me unless he has recurrence of infection at the operative site or some other new infection.  Neuropathy: He is going to be having his feet looked at diligently.  [name]

## 2022-06-17 NOTE — Patient Instructions (Signed)
Bacteria from the OR was pseudomonas aeruginosa

## 2022-06-23 ENCOUNTER — Encounter: Payer: Self-pay | Admitting: Infectious Disease

## 2022-06-24 ENCOUNTER — Telehealth: Payer: Self-pay

## 2022-06-24 DIAGNOSIS — M6281 Muscle weakness (generalized): Secondary | ICD-10-CM | POA: Diagnosis not present

## 2022-06-24 DIAGNOSIS — R262 Difficulty in walking, not elsewhere classified: Secondary | ICD-10-CM | POA: Diagnosis not present

## 2022-06-24 NOTE — Telephone Encounter (Signed)
Informed patient that if diarrhea sx's doesn't improve after stopping ciprofloxacin to call our office to schedule appointment. If sx's improve - patient can follow up in 1 to 2 months to see Dr.Van Dam.     Left voicemail asking patient son Pj Zehner to return my call to schedule follow up in 1 to 2 months and to let him know if patient sx's doesn't improve to make a sooner appointment.

## 2022-06-25 DIAGNOSIS — M6281 Muscle weakness (generalized): Secondary | ICD-10-CM | POA: Diagnosis not present

## 2022-06-25 DIAGNOSIS — R262 Difficulty in walking, not elsewhere classified: Secondary | ICD-10-CM | POA: Diagnosis not present

## 2022-06-25 DIAGNOSIS — M8618 Other acute osteomyelitis, other site: Secondary | ICD-10-CM | POA: Diagnosis not present

## 2022-06-25 NOTE — Telephone Encounter (Signed)
Patient son returned my call. Patient is still having diarrhea - stopped Cipro on 10/9. Patient now has developed a red crusty rash on his forehead. Please advise.   [place], CMA

## 2022-06-26 DIAGNOSIS — R262 Difficulty in walking, not elsewhere classified: Secondary | ICD-10-CM | POA: Diagnosis not present

## 2022-06-26 DIAGNOSIS — M6281 Muscle weakness (generalized): Secondary | ICD-10-CM | POA: Diagnosis not present

## 2022-06-26 NOTE — Telephone Encounter (Signed)
Left voicemail with patient son Pilar Plate asking him to return my call.   [place], CMA

## 2022-06-26 NOTE — Telephone Encounter (Signed)
Patient scheduled with Dr. Candiss Norse tomorrow

## 2022-06-27 ENCOUNTER — Telehealth: Payer: Self-pay | Admitting: Internal Medicine

## 2022-06-27 ENCOUNTER — Ambulatory Visit: Payer: Medicare PPO | Admitting: Internal Medicine

## 2022-06-27 DIAGNOSIS — R262 Difficulty in walking, not elsewhere classified: Secondary | ICD-10-CM | POA: Diagnosis not present

## 2022-06-27 DIAGNOSIS — M6281 Muscle weakness (generalized): Secondary | ICD-10-CM | POA: Diagnosis not present

## 2022-06-27 NOTE — Telephone Encounter (Signed)
I

## 2022-06-30 DIAGNOSIS — R262 Difficulty in walking, not elsewhere classified: Secondary | ICD-10-CM | POA: Diagnosis not present

## 2022-06-30 DIAGNOSIS — M6281 Muscle weakness (generalized): Secondary | ICD-10-CM | POA: Diagnosis not present

## 2022-07-02 DIAGNOSIS — R262 Difficulty in walking, not elsewhere classified: Secondary | ICD-10-CM | POA: Diagnosis not present

## 2022-07-02 DIAGNOSIS — M6281 Muscle weakness (generalized): Secondary | ICD-10-CM | POA: Diagnosis not present

## 2022-07-03 DIAGNOSIS — M6281 Muscle weakness (generalized): Secondary | ICD-10-CM | POA: Diagnosis not present

## 2022-07-03 DIAGNOSIS — R262 Difficulty in walking, not elsewhere classified: Secondary | ICD-10-CM | POA: Diagnosis not present

## 2022-07-04 ENCOUNTER — Encounter: Payer: Self-pay | Admitting: Infectious Disease

## 2022-07-04 DIAGNOSIS — M6281 Muscle weakness (generalized): Secondary | ICD-10-CM | POA: Diagnosis not present

## 2022-07-04 DIAGNOSIS — R262 Difficulty in walking, not elsewhere classified: Secondary | ICD-10-CM | POA: Diagnosis not present

## 2022-07-07 ENCOUNTER — Ambulatory Visit: Payer: Medicare PPO | Admitting: Infectious Disease

## 2022-07-07 NOTE — Telephone Encounter (Signed)
Called patient's son to let him that appointment today was canceled per patient's request. Left voicemail with this information. Leatrice Jewels, RMA

## 2022-07-08 DIAGNOSIS — M6281 Muscle weakness (generalized): Secondary | ICD-10-CM | POA: Diagnosis not present

## 2022-07-08 DIAGNOSIS — R262 Difficulty in walking, not elsewhere classified: Secondary | ICD-10-CM | POA: Diagnosis not present

## 2022-07-09 DIAGNOSIS — M6281 Muscle weakness (generalized): Secondary | ICD-10-CM | POA: Diagnosis not present

## 2022-07-09 DIAGNOSIS — R262 Difficulty in walking, not elsewhere classified: Secondary | ICD-10-CM | POA: Diagnosis not present

## 2022-07-10 DIAGNOSIS — R262 Difficulty in walking, not elsewhere classified: Secondary | ICD-10-CM | POA: Diagnosis not present

## 2022-07-10 DIAGNOSIS — M6281 Muscle weakness (generalized): Secondary | ICD-10-CM | POA: Diagnosis not present

## 2022-07-11 DIAGNOSIS — R262 Difficulty in walking, not elsewhere classified: Secondary | ICD-10-CM | POA: Diagnosis not present

## 2022-07-11 DIAGNOSIS — M6281 Muscle weakness (generalized): Secondary | ICD-10-CM | POA: Diagnosis not present

## 2022-07-15 DIAGNOSIS — R262 Difficulty in walking, not elsewhere classified: Secondary | ICD-10-CM | POA: Diagnosis not present

## 2022-07-15 DIAGNOSIS — M6281 Muscle weakness (generalized): Secondary | ICD-10-CM | POA: Diagnosis not present

## 2022-07-17 DIAGNOSIS — M6281 Muscle weakness (generalized): Secondary | ICD-10-CM | POA: Diagnosis not present

## 2022-07-17 DIAGNOSIS — R262 Difficulty in walking, not elsewhere classified: Secondary | ICD-10-CM | POA: Diagnosis not present

## 2022-07-18 DIAGNOSIS — R262 Difficulty in walking, not elsewhere classified: Secondary | ICD-10-CM | POA: Diagnosis not present

## 2022-07-18 DIAGNOSIS — M6281 Muscle weakness (generalized): Secondary | ICD-10-CM | POA: Diagnosis not present

## 2022-07-22 DIAGNOSIS — R262 Difficulty in walking, not elsewhere classified: Secondary | ICD-10-CM | POA: Diagnosis not present

## 2022-07-22 DIAGNOSIS — M6281 Muscle weakness (generalized): Secondary | ICD-10-CM | POA: Diagnosis not present

## 2022-07-23 DIAGNOSIS — M6281 Muscle weakness (generalized): Secondary | ICD-10-CM | POA: Diagnosis not present

## 2022-07-23 DIAGNOSIS — R262 Difficulty in walking, not elsewhere classified: Secondary | ICD-10-CM | POA: Diagnosis not present

## 2022-07-24 DIAGNOSIS — M6281 Muscle weakness (generalized): Secondary | ICD-10-CM | POA: Diagnosis not present

## 2022-07-24 DIAGNOSIS — R262 Difficulty in walking, not elsewhere classified: Secondary | ICD-10-CM | POA: Diagnosis not present

## 2022-07-24 DIAGNOSIS — R278 Other lack of coordination: Secondary | ICD-10-CM | POA: Diagnosis not present

## 2022-07-28 DIAGNOSIS — R278 Other lack of coordination: Secondary | ICD-10-CM | POA: Diagnosis not present

## 2022-07-28 DIAGNOSIS — M6281 Muscle weakness (generalized): Secondary | ICD-10-CM | POA: Diagnosis not present

## 2022-07-29 DIAGNOSIS — M6281 Muscle weakness (generalized): Secondary | ICD-10-CM | POA: Diagnosis not present

## 2022-07-29 DIAGNOSIS — R262 Difficulty in walking, not elsewhere classified: Secondary | ICD-10-CM | POA: Diagnosis not present

## 2022-07-31 DIAGNOSIS — M6281 Muscle weakness (generalized): Secondary | ICD-10-CM | POA: Diagnosis not present

## 2022-07-31 DIAGNOSIS — R262 Difficulty in walking, not elsewhere classified: Secondary | ICD-10-CM | POA: Diagnosis not present

## 2022-08-03 DIAGNOSIS — R262 Difficulty in walking, not elsewhere classified: Secondary | ICD-10-CM | POA: Diagnosis not present

## 2022-08-03 DIAGNOSIS — M6281 Muscle weakness (generalized): Secondary | ICD-10-CM | POA: Diagnosis not present

## 2022-08-05 DIAGNOSIS — R262 Difficulty in walking, not elsewhere classified: Secondary | ICD-10-CM | POA: Diagnosis not present

## 2022-08-05 DIAGNOSIS — M6281 Muscle weakness (generalized): Secondary | ICD-10-CM | POA: Diagnosis not present

## 2022-08-08 DIAGNOSIS — R278 Other lack of coordination: Secondary | ICD-10-CM | POA: Diagnosis not present

## 2022-08-08 DIAGNOSIS — M6281 Muscle weakness (generalized): Secondary | ICD-10-CM | POA: Diagnosis not present

## 2022-08-12 DIAGNOSIS — R278 Other lack of coordination: Secondary | ICD-10-CM | POA: Diagnosis not present

## 2022-08-12 DIAGNOSIS — M6281 Muscle weakness (generalized): Secondary | ICD-10-CM | POA: Diagnosis not present

## 2022-08-13 DIAGNOSIS — M6281 Muscle weakness (generalized): Secondary | ICD-10-CM | POA: Diagnosis not present

## 2022-08-13 DIAGNOSIS — R262 Difficulty in walking, not elsewhere classified: Secondary | ICD-10-CM | POA: Diagnosis not present

## 2022-08-15 DIAGNOSIS — R262 Difficulty in walking, not elsewhere classified: Secondary | ICD-10-CM | POA: Diagnosis not present

## 2022-08-15 DIAGNOSIS — M6281 Muscle weakness (generalized): Secondary | ICD-10-CM | POA: Diagnosis not present

## 2022-08-18 DIAGNOSIS — R278 Other lack of coordination: Secondary | ICD-10-CM | POA: Diagnosis not present

## 2022-08-18 DIAGNOSIS — R262 Difficulty in walking, not elsewhere classified: Secondary | ICD-10-CM | POA: Diagnosis not present

## 2022-08-18 DIAGNOSIS — M6281 Muscle weakness (generalized): Secondary | ICD-10-CM | POA: Diagnosis not present

## 2022-08-20 DIAGNOSIS — M6281 Muscle weakness (generalized): Secondary | ICD-10-CM | POA: Diagnosis not present

## 2022-08-20 DIAGNOSIS — R278 Other lack of coordination: Secondary | ICD-10-CM | POA: Diagnosis not present

## 2022-08-21 ENCOUNTER — Ambulatory Visit: Payer: Medicare PPO | Admitting: Family

## 2022-08-21 ENCOUNTER — Encounter: Payer: Self-pay | Admitting: Family

## 2022-08-21 ENCOUNTER — Ambulatory Visit: Payer: Medicare PPO | Admitting: Nurse Practitioner

## 2022-08-21 VITALS — BP 134/82 | HR 71 | Ht 75.0 in | Wt 202.0 lb

## 2022-08-21 DIAGNOSIS — S99921A Unspecified injury of right foot, initial encounter: Secondary | ICD-10-CM | POA: Diagnosis not present

## 2022-08-21 NOTE — Patient Instructions (Addendum)
-   Please schedule appointment with Podiatrist as soon as possible to evaluate right foot 3rd toenail.  - cleanse 3rd toe nail with warm water and dial soap then apply triple antibiotic ointment on cut toenail once daily.   - Notify provider for any redness ,swelling ,pain or drainage from cut toenail area.  - Apply moisturizing cream or Vaseline to lower extremities twice daily

## 2022-08-21 NOTE — Progress Notes (Signed)
Provider: Deni Berti FNP-C  Lauree Chandler, NP  Patient Care Team: Lauree Chandler, NP as PCP - General (Geriatric Medicine) Early Osmond, MD as PCP - Cardiology (Cardiology) Elza Rafter, PT as Physical Therapist (Physical Therapy)  Extended Emergency Contact Information Primary Emergency Contact: Antony Odea Address: [address]          [place], [place] [address] Johnnette Litter of [place] Phone: [phone] Relation: Son Secondary Emergency Contact: Renelda Loma Address: [address]          [place], [place] [address] Johnnette Litter of [place] Phone: [phone] Relation: Other  Code Status:  DNR Goals of care: Advanced Directive information    06/12/2022    9:09 AM  Advanced Directives  Does Patient Have a Medical Advance Directive? Yes  Type of Paramedic of [place];Living will  Does patient want to make changes to medical advance directive? No - Patient declined     Chief Complaint  Patient presents with   Wound Check    Wound  on toe  middle toe   on right  foot, no pain , bleed , no redness or swelling .  Also has old wound on heal of left foot  he said that it healed.    HPI:  Pt is a 88 y.o. male seen today for an acute visit for evaluation of right 3 rd toe open area.Here with son who states open area was recently noticed.he denies any redness,swelling,bleeding or drainage from area.states has a significant history of Neuropathy and amputation of 2nd right toenail.Usually examines his feet using a hand held mirror. Does not recall any injuries.  He denies any fever or chills.    Past Medical History:  Diagnosis Date   Cellulitis of right foot 06/04/2022   Constipation    COPD (chronic obstructive pulmonary disease) (HCC)    COVID 05/27/2021   Gait abnormality 02/09/2020   Idiopathic neuropathy 06/04/2022   Neuropathy    Osteomyelitis of second toe of right foot ([place]) 06/04/2022    Peripheral neuropathy 09/30/2017   Pseudomonas aeruginosa infection 06/17/2022   SVT (supraventricular tachycardia)    Past Surgical History:  Procedure Laterality Date   AMPUTATION TOE Right 06/12/2022   Procedure: RIGHT 2ND AMPUTATION TOE, POSSIBLE RAY AMPUTATION;  Surgeon: Wylene Simmer, MD;  Location: ;  Service: Orthopedics;  Laterality: Right;  2nd toe amputation discarded per surgeon's VO   CYST REMOVAL NECK     HIP ARTHROPLASTY Left 12/24/2021   Procedure: ARTHROPLASTY BIPOLAR HIP (HEMIARTHROPLASTY);  Surgeon: [name], MD;  Location: [facility];  Service: Orthopedics;  Laterality: Left;    No Known Allergies  Outpatient Encounter Medications as of 08/21/2022  Medication Sig   B Complex Vitamins (B COMPLEX PO) Take 1 tablet by mouth daily.   Coenzyme Q10 (CO Q-10 PO) Take 1 capsule by mouth daily.   ketoconazole (NIZORAL) 2 % cream APPLY TO AFFECTED AREA EVERY DAY   nystatin ointment (MYCOSTATIN) Apply 1 Application topically 2 (two) times daily.   Omega-3 Fatty Acids (OMEGA 3 PO) Take 1 capsule by mouth daily.   triamcinolone cream (KENALOG) 0.1 % Apply 1 Application topically 2 (two) times daily.   No facility-administered encounter medications on file as of 08/21/2022.    Review of Systems  Constitutional:  Negative for chills and fever.  Respiratory:  Negative for cough, chest tightness, shortness of breath and wheezing.   Cardiovascular:  Negative for chest pain, palpitations  and leg swelling.  Skin:  Negative for color change, pallor and rash.       Right 3 rd toe open area     Immunization History  Administered Date(s) Administered   Moderna SARS-COV2 Booster Vaccination 01/25/2020, 07/19/2020   Moderna Sars-Covid-2 Vaccination 10/06/2019, 11/03/2019   Pfizer Covid-19 Vaccine Bivalent Booster 42yrs & up 06/27/2021   Tdap 09/26/2015, 09/25/2021   Pertinent  Health Maintenance Due  Topic Date Due   INFLUENZA VACCINE  Never done       05/24/2022    2:38 PM 05/28/2022    4:56 PM 06/04/2022   10:10 AM 06/12/2022    9:07 AM 08/21/2022   11:01 AM  Fall Risk  Falls in the past year?   1  0  Number of falls in past year - Comments   [place] and broke hip; surgery required    Was there an injury with Fall?   1    Fall Risk Category Calculator   3    Fall Risk Category   High    Patient Fall Risk Level Low fall risk Low fall risk High fall risk Moderate fall risk   Patient at Risk for Falls Due to   History of fall(s);Impaired mobility;Impaired balance/gait    Patient at Risk for Falls Due to - Comments   w/c    Fall risk Follow up   Falls evaluation completed  Falls evaluation completed   Functional Status Survey:    Vitals:   08/21/22 1103  BP: 134/82  Pulse: 71  SpO2: 93%  Weight: 202 lb (91.6 kg)  Height: 6\' 3"  (1.905 m)   Body mass index is 25.25 kg/m. Physical Exam Constitutional:      General: He is not in acute distress.    Appearance: He is not ill-appearing.  Cardiovascular:     Rate and Rhythm: Normal rate and regular rhythm.     Heart sounds: No murmur heard.    No friction rub. No gallop.  Pulmonary:     Effort: Pulmonary effort is normal. No respiratory distress.     Breath sounds: No rhonchi or rales.     Comments: Bilateral lung diminished expiratory wheezes.chronic per son. Chest:     Chest wall: No tenderness.  Skin:    General: Skin is warm and dry.     Coloration: Skin is not jaundiced or pale.     Findings: No bruising, erythema, lesion or rash.     Comments: Right 3rd toenail with sharp cut across upper part of toenail.dry toe nail on the tip of the toe.None tender to touch,cut dry and without any redness or drainage.Toenail cleansed with saline ,pat dry and Triple antibiotic ointment applied and covered with a band Aid.    Neurological:     Mental Status: He is alert.  Psychiatric:        Mood and Affect: Mood normal.        Behavior: Behavior normal.     Labs reviewed: Recent Labs     05/24/22 1615 05/24/22 1850 05/28/22 1924  NA 125* 126* 130*  K 4.4 4.6 4.9  CL 90* 91* 95*  CO2 26 28 28   GLUCOSE 119* 87 105*  BUN 20 18 16   CREATININE 1.16 1.06 0.90  CALCIUM 9.0 8.5* 8.9   Recent Labs    09/25/21 1223 04/21/22 1817  AST 26 22  ALT 17 13  ALKPHOS 65 65  BILITOT 0.9 0.8  PROT 7.2 7.6  ALBUMIN 3.8 4.2  Recent Labs    04/21/22 1817 05/24/22 1615 05/28/22 1924  WBC 9.6 8.6 8.4  NEUTROABS 7.4 6.7 6.3  HGB 14.6 14.9 14.8  HCT 43.3 42.3 43.8  MCV 93.7 90.2 93.6  PLT 178 189 200   Lab Results  Component Value Date   TSH 2.119 05/24/2022   No results found for: "HGBA1C" No results found for: "CHOL", "HDL", "LDLCALC", "LDLDIRECT", "TRIG", "CHOLHDL"  Significant Diagnostic Results in last 30 days:  No results found.  Assessment/Plan   Injury of toenail of right foot, initial encounter Afebrile  Right 3rd toenail with sharp cut across upper part of toenail.dry toe nail on the tip of the toe.None tender to touch,cut dry and without any redness or drainage.Toenail cleansed with saline ,pat dry and Triple antibiotic ointment applied and covered with a band Aid for protection.  - advised to cleanse toenail at least once daily as above. - Patient's son will schedule appointment with patient's podiatrist as soon as possible.  - Advised to notify provider for any redness, swelling, bleeding or drainage from open area.  Family/ staff Communication: Reviewed plan of care with patient and son verbalized understanding  Labs/tests ordered: None   Next Appointment: Return if symptoms worsen or fail to improve.    Caesar Bookman, NP

## 2022-08-22 DIAGNOSIS — R262 Difficulty in walking, not elsewhere classified: Secondary | ICD-10-CM | POA: Diagnosis not present

## 2022-08-22 DIAGNOSIS — R278 Other lack of coordination: Secondary | ICD-10-CM | POA: Diagnosis not present

## 2022-08-22 DIAGNOSIS — M6281 Muscle weakness (generalized): Secondary | ICD-10-CM | POA: Diagnosis not present

## 2022-08-26 DIAGNOSIS — R262 Difficulty in walking, not elsewhere classified: Secondary | ICD-10-CM | POA: Diagnosis not present

## 2022-08-26 DIAGNOSIS — M6281 Muscle weakness (generalized): Secondary | ICD-10-CM | POA: Diagnosis not present

## 2022-08-27 DIAGNOSIS — R278 Other lack of coordination: Secondary | ICD-10-CM | POA: Diagnosis not present

## 2022-08-27 DIAGNOSIS — M6281 Muscle weakness (generalized): Secondary | ICD-10-CM | POA: Diagnosis not present

## 2022-08-28 DIAGNOSIS — R262 Difficulty in walking, not elsewhere classified: Secondary | ICD-10-CM | POA: Diagnosis not present

## 2022-08-28 DIAGNOSIS — M6281 Muscle weakness (generalized): Secondary | ICD-10-CM | POA: Diagnosis not present

## 2022-08-29 DIAGNOSIS — R278 Other lack of coordination: Secondary | ICD-10-CM | POA: Diagnosis not present

## 2022-08-29 DIAGNOSIS — M6281 Muscle weakness (generalized): Secondary | ICD-10-CM | POA: Diagnosis not present

## 2022-09-01 DIAGNOSIS — M6281 Muscle weakness (generalized): Secondary | ICD-10-CM | POA: Diagnosis not present

## 2022-09-01 DIAGNOSIS — R278 Other lack of coordination: Secondary | ICD-10-CM | POA: Diagnosis not present

## 2022-09-01 DIAGNOSIS — R262 Difficulty in walking, not elsewhere classified: Secondary | ICD-10-CM | POA: Diagnosis not present

## 2022-09-02 DIAGNOSIS — M6281 Muscle weakness (generalized): Secondary | ICD-10-CM | POA: Diagnosis not present

## 2022-09-02 DIAGNOSIS — R262 Difficulty in walking, not elsewhere classified: Secondary | ICD-10-CM | POA: Diagnosis not present

## 2022-09-03 ENCOUNTER — Ambulatory Visit: Payer: Medicare PPO | Admitting: Podiatry

## 2022-09-03 VITALS — BP 150/81 | HR 74

## 2022-09-03 DIAGNOSIS — R278 Other lack of coordination: Secondary | ICD-10-CM | POA: Diagnosis not present

## 2022-09-03 DIAGNOSIS — M722 Plantar fascial fibromatosis: Secondary | ICD-10-CM | POA: Diagnosis not present

## 2022-09-03 DIAGNOSIS — M6281 Muscle weakness (generalized): Secondary | ICD-10-CM | POA: Diagnosis not present

## 2022-09-03 MED ORDER — TRIAMCINOLONE ACETONIDE 10 MG/ML IJ SUSP
10.0000 mg | Freq: Once | INTRAMUSCULAR | Status: AC
  Administered 2022-09-03: 10 mg

## 2022-09-03 NOTE — Progress Notes (Signed)
Subjective:   Patient ID: Hector Beltran, male   DOB: 88 y.o.   MRN: [id number]   HPI Patient presents with pain in his left heel and concerned about some forefoot issues with caregiver but those have not had any drainage   ROS      Objective:  Physical Exam  Neurovascular status seems to be intact with the patient's left heel sore with no breakdowns of tissue no breakdowns of tissue on the digits with some crusted like tissue formation     Assessment:  Hopeful acute [place] fasciitis left cannot rule out other pathology but this is the most likely scenario with everything else looking good currently     Plan:  Explained to caregiver everything else appears to be just a crusted scab tissue no breakdown of tissue I went ahead today and I did do sterile prep and injected the plantar fascia 3 mg Dexasone Kenalog 5 mg Xylocaine reappoint for recheck

## 2022-09-10 DIAGNOSIS — R262 Difficulty in walking, not elsewhere classified: Secondary | ICD-10-CM | POA: Diagnosis not present

## 2022-09-10 DIAGNOSIS — M6281 Muscle weakness (generalized): Secondary | ICD-10-CM | POA: Diagnosis not present

## 2022-09-11 DIAGNOSIS — R262 Difficulty in walking, not elsewhere classified: Secondary | ICD-10-CM | POA: Diagnosis not present

## 2022-09-11 DIAGNOSIS — M6281 Muscle weakness (generalized): Secondary | ICD-10-CM | POA: Diagnosis not present

## 2022-09-12 DIAGNOSIS — R278 Other lack of coordination: Secondary | ICD-10-CM | POA: Diagnosis not present

## 2022-09-12 DIAGNOSIS — M6281 Muscle weakness (generalized): Secondary | ICD-10-CM | POA: Diagnosis not present

## 2022-09-16 DIAGNOSIS — M6281 Muscle weakness (generalized): Secondary | ICD-10-CM | POA: Diagnosis not present

## 2022-09-16 DIAGNOSIS — R262 Difficulty in walking, not elsewhere classified: Secondary | ICD-10-CM | POA: Diagnosis not present

## 2022-09-16 DIAGNOSIS — R278 Other lack of coordination: Secondary | ICD-10-CM | POA: Diagnosis not present

## 2022-09-18 DIAGNOSIS — M6281 Muscle weakness (generalized): Secondary | ICD-10-CM | POA: Diagnosis not present

## 2022-09-18 DIAGNOSIS — R262 Difficulty in walking, not elsewhere classified: Secondary | ICD-10-CM | POA: Diagnosis not present

## 2022-09-19 DIAGNOSIS — M6281 Muscle weakness (generalized): Secondary | ICD-10-CM | POA: Diagnosis not present

## 2022-09-19 DIAGNOSIS — R278 Other lack of coordination: Secondary | ICD-10-CM | POA: Diagnosis not present

## 2022-09-23 DIAGNOSIS — M6281 Muscle weakness (generalized): Secondary | ICD-10-CM | POA: Diagnosis not present

## 2022-09-23 DIAGNOSIS — R278 Other lack of coordination: Secondary | ICD-10-CM | POA: Diagnosis not present

## 2022-09-24 DIAGNOSIS — M6281 Muscle weakness (generalized): Secondary | ICD-10-CM | POA: Diagnosis not present

## 2022-09-24 DIAGNOSIS — R262 Difficulty in walking, not elsewhere classified: Secondary | ICD-10-CM | POA: Diagnosis not present

## 2022-09-25 DIAGNOSIS — R278 Other lack of coordination: Secondary | ICD-10-CM | POA: Diagnosis not present

## 2022-09-25 DIAGNOSIS — M6281 Muscle weakness (generalized): Secondary | ICD-10-CM | POA: Diagnosis not present

## 2022-09-26 ENCOUNTER — Ambulatory Visit: Payer: Medicare PPO | Admitting: Nurse Practitioner

## 2022-09-26 DIAGNOSIS — R262 Difficulty in walking, not elsewhere classified: Secondary | ICD-10-CM | POA: Diagnosis not present

## 2022-09-26 DIAGNOSIS — M6281 Muscle weakness (generalized): Secondary | ICD-10-CM | POA: Diagnosis not present

## 2022-09-29 ENCOUNTER — Encounter: Payer: Self-pay | Admitting: Nurse Practitioner

## 2022-09-29 ENCOUNTER — Ambulatory Visit: Payer: Medicare PPO | Admitting: Nurse Practitioner

## 2022-09-29 VITALS — BP 140/80 | HR 74 | Temp 98.2°F | Resp 17 | Ht 75.0 in | Wt 194.0 lb

## 2022-09-29 DIAGNOSIS — K7469 Other cirrhosis of liver: Secondary | ICD-10-CM

## 2022-09-29 DIAGNOSIS — L84 Corns and callosities: Secondary | ICD-10-CM | POA: Diagnosis not present

## 2022-09-29 DIAGNOSIS — I5032 Chronic diastolic (congestive) heart failure: Secondary | ICD-10-CM | POA: Diagnosis not present

## 2022-09-29 DIAGNOSIS — I700 Atherosclerosis of aorta: Secondary | ICD-10-CM

## 2022-09-29 DIAGNOSIS — E871 Hypo-osmolality and hyponatremia: Secondary | ICD-10-CM | POA: Diagnosis not present

## 2022-09-29 DIAGNOSIS — J449 Chronic obstructive pulmonary disease, unspecified: Secondary | ICD-10-CM

## 2022-09-29 DIAGNOSIS — G609 Hereditary and idiopathic neuropathy, unspecified: Secondary | ICD-10-CM

## 2022-09-29 DIAGNOSIS — E441 Mild protein-calorie malnutrition: Secondary | ICD-10-CM

## 2022-09-29 DIAGNOSIS — K5900 Constipation, unspecified: Secondary | ICD-10-CM

## 2022-09-29 NOTE — Progress Notes (Signed)
Careteam: Patient Care Team: Sharon Seller, NP as PCP - General (Geriatric Medicine) Orbie Pyo, MD as PCP - Cardiology (Cardiology) Sherren Kerns, PT as Physical Therapist (Physical Therapy)  PLACE OF SERVICE:  [facility]  Advanced Directive information    No Known Allergies  Chief Complaint  Patient presents with   Medical Management of Chronic Issues    4 month follow up and discuss a cough and discussed the toe being removed.     HPI: Patient is a 89 y.o. male here for a routine visit.   The patient has recovered from his toe amputation. He just saw the podiatrist in December. No new complaints on his feet today.   The patient c/o constipation. He states that he is 'slow to excrete' and his stool is pasty and dry. Family with him reports that he does not drink enough water and drinks too many cups of regular coffee in a day, more than 4-5. He has a stool softener he has been trying since Saturday but it hasn't seemed to help as yet.   He has had a congested, productive cough for approximately 3 days. He does not spit the mucus out so he is unsure of the color. Denies fever, chills, headache, or new congestion (has chronic allergies with rhinitis). He has not tried anything to resolve the coughing.  It is not worse at night. It is not significantly worse in the morning. It is present during the day.   Review of Systems:  Review of Systems  Constitutional:  Negative for chills, fever, malaise/fatigue and weight loss.  HENT:  Negative for congestion and sore throat.   Eyes:  Negative for blurred vision.  Respiratory:  Positive for cough and sputum production. Negative for shortness of breath and wheezing.   Cardiovascular:  Negative for chest pain, palpitations and leg swelling.  Gastrointestinal:  Positive for constipation. Negative for abdominal pain, blood in stool, diarrhea, heartburn, nausea and vomiting.  Genitourinary:  Negative for dysuria,  frequency, hematuria and urgency.  Musculoskeletal:  Negative for falls and joint pain.  Skin:  Negative for rash.  Neurological:  Negative for dizziness, tingling and headaches.  Endo/Heme/Allergies:  Negative for polydipsia.  Psychiatric/Behavioral:  Negative for depression. The patient is not nervous/anxious.     Past Medical History:  Diagnosis Date   Cellulitis of right foot 06/04/2022   Constipation    COPD (chronic obstructive pulmonary disease) (HCC)    COVID 05/27/2021   Gait abnormality 02/09/2020   Idiopathic neuropathy 06/04/2022   Neuropathy    Osteomyelitis of second toe of right foot (HCC) 06/04/2022   Peripheral neuropathy 09/30/2017   Pseudomonas aeruginosa infection 06/17/2022   SVT (supraventricular tachycardia)    Past Surgical History:  Procedure Laterality Date   AMPUTATION TOE Right 06/12/2022   Procedure: RIGHT 2ND AMPUTATION TOE, POSSIBLE RAY AMPUTATION;  Surgeon: Toni Arthurs, MD;  Location: [facility];  Service: Orthopedics;  Laterality: Right;  2nd toe amputation discarded per surgeon's VO   CYST REMOVAL NECK     HIP ARTHROPLASTY Left 12/24/2021   Procedure: ARTHROPLASTY BIPOLAR HIP (HEMIARTHROPLASTY);  Surgeon: Eldred Manges, MD;  Location: [facility];  Service: Orthopedics;  Laterality: Left;   Social History:   reports that he quit smoking about 2 years ago. His smoking use included cigarettes. He has a 12.50 pack-year smoking history. He has never used smokeless tobacco. He reports current alcohol use of about 3.0 standard drinks of alcohol per week.  He reports that he does not use drugs.  History reviewed. No pertinent family history.  Medications: Patient's Medications  New Prescriptions   No medications on file  Previous Medications   B COMPLEX VITAMINS (B COMPLEX PO)    Take 1 tablet by mouth daily.   COENZYME Q10 (CO Q-10 PO)    Take 1 capsule by mouth daily.   OMEGA-3 FATTY ACIDS (OMEGA 3 PO)    Take 1 capsule by mouth daily.   Modified Medications   No medications on file  Discontinued Medications   KETOCONAZOLE (NIZORAL) 2 % CREAM    APPLY TO AFFECTED AREA EVERY DAY   NYSTATIN OINTMENT (MYCOSTATIN)    Apply 1 Application topically 2 (two) times daily.   TRIAMCINOLONE CREAM (KENALOG) 0.1 %    Apply 1 Application topically 2 (two) times daily.    Physical Exam:  Vitals:   09/29/22 1508 09/29/22 1553  BP: (!) 146/86 (!) 140/80  Pulse: 74   Resp: 17   Temp: 98.2 F (36.8 C)   TempSrc: Temporal   SpO2: 96%   Weight: 194 lb (88 kg)   Height: 6\' 3"  (1.905 m)    Body mass index is 24.25 kg/m. Wt Readings from Last 3 Encounters:  09/29/22 194 lb (88 kg)  08/21/22 202 lb (91.6 kg)  06/12/22 192 lb 14.4 oz (87.5 kg)    Physical Exam Vitals and nursing note reviewed. Exam conducted with a chaperone present.  Cardiovascular:     Rate and Rhythm: Normal rate and regular rhythm.     Pulses:          Dorsalis pedis pulses are 2+ on the right side and 2+ on the left side.     Heart sounds: Normal heart sounds. No murmur heard. Pulmonary:     Breath sounds: Wheezing present. No rales.     Comments: Slight wheeze and diminished lower left lobe Abdominal:     General: Bowel sounds are normal.     Palpations: Abdomen is soft. There is no mass.     Tenderness: There is no abdominal tenderness. There is no guarding.  Musculoskeletal:        General: No swelling or tenderness.     Right Lower Extremity: (2nd toe) Feet:     Right foot:     Skin integrity: Callus present.     Toenail Condition: Right toenails are abnormally thick.     Left foot:     Skin integrity: Callus present.     Toenail Condition: Left toenails are abnormally thick.     Comments: Callus present with eschar on L 2nd and 4th toe and R 3rd toe.  Skin:    General: Skin is warm and dry.  Neurological:     Mental Status: He is alert and oriented to person, place, and time. Mental status is at baseline.  Psychiatric:        Mood and  Affect: Mood normal.        Behavior: Behavior normal.        Judgment: Judgment normal.     Labs reviewed: Basic Metabolic Panel: Recent Labs    05/24/22 1615 05/24/22 1850 05/28/22 1924  NA 125* 126* 130*  K 4.4 4.6 4.9  CL 90* 91* 95*  CO2 26 28 28   GLUCOSE 119* 87 105*  BUN 20 18 16   CREATININE 1.16 1.06 0.90  CALCIUM 9.0 8.5* 8.9  TSH 2.119  --   --    Liver Function Tests:  Recent Labs    04/21/22 1817  AST 22  ALT 13  ALKPHOS 65  BILITOT 0.8  PROT 7.6  ALBUMIN 4.2   No results for input(s): "LIPASE", "AMYLASE" in the last 8760 hours. No results for input(s): "AMMONIA" in the last 8760 hours. CBC: Recent Labs    04/21/22 1817 05/24/22 1615 05/28/22 1924  WBC 9.6 8.6 8.4  NEUTROABS 7.4 6.7 6.3  HGB 14.6 14.9 14.8  HCT 43.3 42.3 43.8  MCV 93.7 90.2 93.6  PLT 178 189 200   Lipid Panel: No results for input(s): "CHOL", "HDL", "LDLCALC", "TRIG", "CHOLHDL", "LDLDIRECT" in the last 8760 hours. TSH: Recent Labs    05/24/22 1615  TSH 2.119   A1C: No results found for: "HGBA1C"   Assessment/Plan 1. Mild protein-calorie malnutrition ([place]) Patient has lost 8 lbs since last visit. Patient and family report patient eats very well and that appetite is good. He lives in independent living at [facility] and eats at least 3 meals a day. He drinks powerade and coffee. Encouraged patient and family to monitor weight and appetite. - CMP with eGFR([facility])  2. Chronic diastolic HF (heart failure) (HCC) Denies shortness of breath, swelling, dry cough today. Has had lasix PRN in the past. Monitors weight daily, aware to contact provider if weight trends up. Stable.  - CBC with Differential/Platelet  3. Chronic obstructive pulmonary disease, unspecified COPD type (HCC) Productive cough today with slight wheeze. Patient and family educated to contact provider should patient develop worsening shortness of breath or fever. Patient declines albuterol RX today. Start  mucinex DM and encouraged water intake with medication. - CBC with Differential/Platelet  4. Hyponatremia Na has been low for last few months but was improved at last visit. Continues electrolyte drinks. Check labs today and monitor.  - CMP with eGFR([facility])  5. Constipation, unspecified constipation type Patient to start miralax and continue stool softener (his son bought him) once daily. Educated to decrease coffee intake and increase water daily. He and family are in agreement with the current plan.   6. Idiopathic peripheral neuropathy Patient and family to follow-up with podiatry for foot care. Monitor closely. No increase in symptoms.   7. Callus of toes He has new callused areas covered with eschar approximately 37mm in diameter on his left foot on the second and fourth toe. The laceration on the 3rd toe on his right foot appears to have healed and formed callus with eschar underneath. There is no drainage or erythema present in either foot. Will continue to monitor and have him follow with podiatry.   8. Aortic atherosclerosis ([place]) -noted, not currently on ASA or statin management, will discuss more at next follow up- he does not like taking medication   9. Other cirrhosis of liver ([place]) -monitor liver enzymes.     Return in about 6 months (around 03/30/2023) for routine follow up.  Student- Archer Asa O'Berry ACPCNP-S  I personally was present during the history, physical exam and medical decision-making activities of this service and have verified that the service and findings are accurately documented in the student's note Geroldine Esquivias K. [place], [facility] [phone]

## 2022-09-29 NOTE — Patient Instructions (Addendum)
To start miralax 17 gm daily for constipation  Can take stool softener in addition to this.   Mucinex DM by mouth twice daily with full glass of water for 7 days scheduled then as needed

## 2022-09-30 DIAGNOSIS — R262 Difficulty in walking, not elsewhere classified: Secondary | ICD-10-CM | POA: Diagnosis not present

## 2022-09-30 DIAGNOSIS — M6281 Muscle weakness (generalized): Secondary | ICD-10-CM | POA: Diagnosis not present

## 2022-09-30 LAB — CBC WITH DIFFERENTIAL/PLATELET
Absolute Monocytes: 653 cells/uL (ref 200–950)
Basophils Absolute: 48 cells/uL (ref 0–200)
Basophils Relative: 0.7 %
Eosinophils Absolute: 211 cells/uL (ref 15–500)
Eosinophils Relative: 3.1 %
HCT: 43.1 % (ref 38.5–50.0)
Hemoglobin: 14.7 g/dL (ref 13.2–17.1)
Lymphs Abs: 1272 cells/uL (ref 850–3900)
MCH: 32.1 pg (ref 27.0–33.0)
MCHC: 34.1 g/dL (ref 32.0–36.0)
MCV: 94.1 fL (ref 80.0–100.0)
MPV: 9.2 fL (ref 7.5–12.5)
Monocytes Relative: 9.6 %
Neutro Abs: 4617 cells/uL (ref 1500–7800)
Neutrophils Relative %: 67.9 %
Platelets: 203 10*3/uL (ref 140–400)
RBC: 4.58 10*6/uL (ref 4.20–5.80)
RDW: 12.8 % (ref 11.0–15.0)
Total Lymphocyte: 18.7 %
WBC: 6.8 10*3/uL (ref 3.8–10.8)

## 2022-09-30 LAB — COMPLETE METABOLIC PANEL WITH GFR
AG Ratio: 1.3 (calc) (ref 1.0–2.5)
ALT: 17 U/L (ref 9–46)
AST: 21 U/L (ref 10–35)
Albumin: 3.9 g/dL (ref 3.6–5.1)
Alkaline phosphatase (APISO): 69 U/L (ref 35–144)
BUN: 15 mg/dL (ref 7–25)
CO2: 30 mmol/L (ref 20–32)
Calcium: 9.2 mg/dL (ref 8.6–10.3)
Chloride: 98 mmol/L (ref 98–110)
Creat: 0.87 mg/dL (ref 0.70–1.22)
Globulin: 3 g/dL (calc) (ref 1.9–3.7)
Glucose, Bld: 90 mg/dL (ref 65–99)
Potassium: 4.9 mmol/L (ref 3.5–5.3)
Sodium: 136 mmol/L (ref 135–146)
Total Bilirubin: 0.7 mg/dL (ref 0.2–1.2)
Total Protein: 6.9 g/dL (ref 6.1–8.1)
eGFR: 81 mL/min/{1.73_m2} (ref >=60)

## 2022-10-01 DIAGNOSIS — M6281 Muscle weakness (generalized): Secondary | ICD-10-CM | POA: Diagnosis not present

## 2022-10-01 DIAGNOSIS — I700 Atherosclerosis of aorta: Secondary | ICD-10-CM | POA: Insufficient documentation

## 2022-10-01 DIAGNOSIS — R278 Other lack of coordination: Secondary | ICD-10-CM | POA: Diagnosis not present

## 2022-10-02 DIAGNOSIS — M6281 Muscle weakness (generalized): Secondary | ICD-10-CM | POA: Diagnosis not present

## 2022-10-02 DIAGNOSIS — R262 Difficulty in walking, not elsewhere classified: Secondary | ICD-10-CM | POA: Diagnosis not present

## 2022-10-03 DIAGNOSIS — R278 Other lack of coordination: Secondary | ICD-10-CM | POA: Diagnosis not present

## 2022-10-03 DIAGNOSIS — M6281 Muscle weakness (generalized): Secondary | ICD-10-CM | POA: Diagnosis not present

## 2022-10-06 ENCOUNTER — Ambulatory Visit (INDEPENDENT_AMBULATORY_CARE_PROVIDER_SITE_OTHER): Payer: Medicare PPO | Admitting: Podiatry

## 2022-10-06 ENCOUNTER — Encounter: Payer: Self-pay | Admitting: Podiatry

## 2022-10-06 DIAGNOSIS — M2042 Other hammer toe(s) (acquired), left foot: Secondary | ICD-10-CM

## 2022-10-06 DIAGNOSIS — L84 Corns and callosities: Secondary | ICD-10-CM

## 2022-10-06 NOTE — Progress Notes (Signed)
Subjective:   Patient ID: Hector Beltran, male   DOB: 89 y.o.   MRN: [id number]   HPI Patient presents with digital deformities bilateral lesions that formed bilateral nail disease and generalized poor health.  He presents with his son who is his caregiver   ROS      Objective:  Physical Exam  Neurovascular status unchanged with patient found to have significant digital deformities bilateral with pressure against the distal digits and pain with this pressure.  Lesion formations nail disease structural issues     Assessment:  Hammertoe deformity lesion formation nail disease     Plan:  H&P reviewed and went ahead debrided lesions advised on wider shoes discussed hammertoe with him and his caregiver and I do not recommend surgery due to advanced age but if absolutely we had to this is something that could be considered.  Educated him on the condition

## 2022-10-07 DIAGNOSIS — M6281 Muscle weakness (generalized): Secondary | ICD-10-CM | POA: Diagnosis not present

## 2022-10-07 DIAGNOSIS — R262 Difficulty in walking, not elsewhere classified: Secondary | ICD-10-CM | POA: Diagnosis not present

## 2022-10-08 DIAGNOSIS — M6281 Muscle weakness (generalized): Secondary | ICD-10-CM | POA: Diagnosis not present

## 2022-10-08 DIAGNOSIS — R278 Other lack of coordination: Secondary | ICD-10-CM | POA: Diagnosis not present

## 2022-10-09 DIAGNOSIS — M6281 Muscle weakness (generalized): Secondary | ICD-10-CM | POA: Diagnosis not present

## 2022-10-09 DIAGNOSIS — R262 Difficulty in walking, not elsewhere classified: Secondary | ICD-10-CM | POA: Diagnosis not present

## 2022-10-10 DIAGNOSIS — R278 Other lack of coordination: Secondary | ICD-10-CM | POA: Diagnosis not present

## 2022-10-10 DIAGNOSIS — M6281 Muscle weakness (generalized): Secondary | ICD-10-CM | POA: Diagnosis not present

## 2022-10-14 DIAGNOSIS — M6281 Muscle weakness (generalized): Secondary | ICD-10-CM | POA: Diagnosis not present

## 2022-10-14 DIAGNOSIS — R262 Difficulty in walking, not elsewhere classified: Secondary | ICD-10-CM | POA: Diagnosis not present

## 2022-10-15 DIAGNOSIS — R278 Other lack of coordination: Secondary | ICD-10-CM | POA: Diagnosis not present

## 2022-10-15 DIAGNOSIS — M6281 Muscle weakness (generalized): Secondary | ICD-10-CM | POA: Diagnosis not present

## 2022-10-23 DIAGNOSIS — R262 Difficulty in walking, not elsewhere classified: Secondary | ICD-10-CM | POA: Diagnosis not present

## 2022-10-23 DIAGNOSIS — M6281 Muscle weakness (generalized): Secondary | ICD-10-CM | POA: Diagnosis not present

## 2022-10-24 DIAGNOSIS — R262 Difficulty in walking, not elsewhere classified: Secondary | ICD-10-CM | POA: Diagnosis not present

## 2022-10-24 DIAGNOSIS — M6281 Muscle weakness (generalized): Secondary | ICD-10-CM | POA: Diagnosis not present

## 2022-12-08 DIAGNOSIS — M6281 Muscle weakness (generalized): Secondary | ICD-10-CM | POA: Diagnosis not present

## 2022-12-08 DIAGNOSIS — R2689 Other abnormalities of gait and mobility: Secondary | ICD-10-CM | POA: Diagnosis not present

## 2022-12-08 DIAGNOSIS — R2681 Unsteadiness on feet: Secondary | ICD-10-CM | POA: Diagnosis not present

## 2022-12-09 DIAGNOSIS — R2681 Unsteadiness on feet: Secondary | ICD-10-CM | POA: Diagnosis not present

## 2022-12-09 DIAGNOSIS — R2689 Other abnormalities of gait and mobility: Secondary | ICD-10-CM | POA: Diagnosis not present

## 2022-12-09 DIAGNOSIS — M6281 Muscle weakness (generalized): Secondary | ICD-10-CM | POA: Diagnosis not present

## 2022-12-12 DIAGNOSIS — R2689 Other abnormalities of gait and mobility: Secondary | ICD-10-CM | POA: Diagnosis not present

## 2022-12-12 DIAGNOSIS — R2681 Unsteadiness on feet: Secondary | ICD-10-CM | POA: Diagnosis not present

## 2022-12-12 DIAGNOSIS — M6281 Muscle weakness (generalized): Secondary | ICD-10-CM | POA: Diagnosis not present

## 2022-12-15 DIAGNOSIS — M6281 Muscle weakness (generalized): Secondary | ICD-10-CM | POA: Diagnosis not present

## 2022-12-15 DIAGNOSIS — R2681 Unsteadiness on feet: Secondary | ICD-10-CM | POA: Diagnosis not present

## 2022-12-15 DIAGNOSIS — R2689 Other abnormalities of gait and mobility: Secondary | ICD-10-CM | POA: Diagnosis not present

## 2022-12-16 DIAGNOSIS — R2681 Unsteadiness on feet: Secondary | ICD-10-CM | POA: Diagnosis not present

## 2022-12-16 DIAGNOSIS — M6281 Muscle weakness (generalized): Secondary | ICD-10-CM | POA: Diagnosis not present

## 2022-12-16 DIAGNOSIS — R2689 Other abnormalities of gait and mobility: Secondary | ICD-10-CM | POA: Diagnosis not present

## 2022-12-18 DIAGNOSIS — R2681 Unsteadiness on feet: Secondary | ICD-10-CM | POA: Diagnosis not present

## 2022-12-18 DIAGNOSIS — M6281 Muscle weakness (generalized): Secondary | ICD-10-CM | POA: Diagnosis not present

## 2022-12-18 DIAGNOSIS — R2689 Other abnormalities of gait and mobility: Secondary | ICD-10-CM | POA: Diagnosis not present

## 2022-12-23 DIAGNOSIS — R2689 Other abnormalities of gait and mobility: Secondary | ICD-10-CM | POA: Diagnosis not present

## 2022-12-23 DIAGNOSIS — R2681 Unsteadiness on feet: Secondary | ICD-10-CM | POA: Diagnosis not present

## 2022-12-23 DIAGNOSIS — M6281 Muscle weakness (generalized): Secondary | ICD-10-CM | POA: Diagnosis not present

## 2022-12-24 DIAGNOSIS — R2681 Unsteadiness on feet: Secondary | ICD-10-CM | POA: Diagnosis not present

## 2022-12-24 DIAGNOSIS — R2689 Other abnormalities of gait and mobility: Secondary | ICD-10-CM | POA: Diagnosis not present

## 2022-12-24 DIAGNOSIS — M6281 Muscle weakness (generalized): Secondary | ICD-10-CM | POA: Diagnosis not present

## 2022-12-25 ENCOUNTER — Ambulatory Visit: Payer: Medicare PPO | Admitting: Neurology

## 2022-12-25 DIAGNOSIS — R2689 Other abnormalities of gait and mobility: Secondary | ICD-10-CM | POA: Diagnosis not present

## 2022-12-25 DIAGNOSIS — R2681 Unsteadiness on feet: Secondary | ICD-10-CM | POA: Diagnosis not present

## 2022-12-25 DIAGNOSIS — M6281 Muscle weakness (generalized): Secondary | ICD-10-CM | POA: Diagnosis not present

## 2022-12-29 DIAGNOSIS — R2689 Other abnormalities of gait and mobility: Secondary | ICD-10-CM | POA: Diagnosis not present

## 2022-12-29 DIAGNOSIS — R2681 Unsteadiness on feet: Secondary | ICD-10-CM | POA: Diagnosis not present

## 2022-12-29 DIAGNOSIS — M6281 Muscle weakness (generalized): Secondary | ICD-10-CM | POA: Diagnosis not present

## 2022-12-30 DIAGNOSIS — M6281 Muscle weakness (generalized): Secondary | ICD-10-CM | POA: Diagnosis not present

## 2022-12-30 DIAGNOSIS — R2689 Other abnormalities of gait and mobility: Secondary | ICD-10-CM | POA: Diagnosis not present

## 2022-12-30 DIAGNOSIS — R2681 Unsteadiness on feet: Secondary | ICD-10-CM | POA: Diagnosis not present

## 2023-01-05 DIAGNOSIS — R2689 Other abnormalities of gait and mobility: Secondary | ICD-10-CM | POA: Diagnosis not present

## 2023-01-05 DIAGNOSIS — M6281 Muscle weakness (generalized): Secondary | ICD-10-CM | POA: Diagnosis not present

## 2023-01-05 DIAGNOSIS — R2681 Unsteadiness on feet: Secondary | ICD-10-CM | POA: Diagnosis not present

## 2023-01-07 DIAGNOSIS — M6281 Muscle weakness (generalized): Secondary | ICD-10-CM | POA: Diagnosis not present

## 2023-01-07 DIAGNOSIS — R2681 Unsteadiness on feet: Secondary | ICD-10-CM | POA: Diagnosis not present

## 2023-01-07 DIAGNOSIS — R2689 Other abnormalities of gait and mobility: Secondary | ICD-10-CM | POA: Diagnosis not present

## 2023-01-08 DIAGNOSIS — R2681 Unsteadiness on feet: Secondary | ICD-10-CM | POA: Diagnosis not present

## 2023-01-08 DIAGNOSIS — M6281 Muscle weakness (generalized): Secondary | ICD-10-CM | POA: Diagnosis not present

## 2023-01-08 DIAGNOSIS — R2689 Other abnormalities of gait and mobility: Secondary | ICD-10-CM | POA: Diagnosis not present

## 2023-01-12 DIAGNOSIS — R2681 Unsteadiness on feet: Secondary | ICD-10-CM | POA: Diagnosis not present

## 2023-01-12 DIAGNOSIS — R2689 Other abnormalities of gait and mobility: Secondary | ICD-10-CM | POA: Diagnosis not present

## 2023-01-12 DIAGNOSIS — M6281 Muscle weakness (generalized): Secondary | ICD-10-CM | POA: Diagnosis not present

## 2023-01-14 DIAGNOSIS — R2681 Unsteadiness on feet: Secondary | ICD-10-CM | POA: Diagnosis not present

## 2023-01-14 DIAGNOSIS — R2689 Other abnormalities of gait and mobility: Secondary | ICD-10-CM | POA: Diagnosis not present

## 2023-01-14 DIAGNOSIS — M6281 Muscle weakness (generalized): Secondary | ICD-10-CM | POA: Diagnosis not present

## 2023-01-20 DIAGNOSIS — R2681 Unsteadiness on feet: Secondary | ICD-10-CM | POA: Diagnosis not present

## 2023-01-20 DIAGNOSIS — R2689 Other abnormalities of gait and mobility: Secondary | ICD-10-CM | POA: Diagnosis not present

## 2023-01-20 DIAGNOSIS — M6281 Muscle weakness (generalized): Secondary | ICD-10-CM | POA: Diagnosis not present

## 2023-01-23 DIAGNOSIS — R2689 Other abnormalities of gait and mobility: Secondary | ICD-10-CM | POA: Diagnosis not present

## 2023-01-23 DIAGNOSIS — M6281 Muscle weakness (generalized): Secondary | ICD-10-CM | POA: Diagnosis not present

## 2023-01-23 DIAGNOSIS — R2681 Unsteadiness on feet: Secondary | ICD-10-CM | POA: Diagnosis not present

## 2023-01-27 DIAGNOSIS — M6281 Muscle weakness (generalized): Secondary | ICD-10-CM | POA: Diagnosis not present

## 2023-01-27 DIAGNOSIS — R2689 Other abnormalities of gait and mobility: Secondary | ICD-10-CM | POA: Diagnosis not present

## 2023-01-27 DIAGNOSIS — R2681 Unsteadiness on feet: Secondary | ICD-10-CM | POA: Diagnosis not present

## 2023-01-29 DIAGNOSIS — M6281 Muscle weakness (generalized): Secondary | ICD-10-CM | POA: Diagnosis not present

## 2023-01-29 DIAGNOSIS — R2681 Unsteadiness on feet: Secondary | ICD-10-CM | POA: Diagnosis not present

## 2023-01-29 DIAGNOSIS — R2689 Other abnormalities of gait and mobility: Secondary | ICD-10-CM | POA: Diagnosis not present

## 2023-02-03 DIAGNOSIS — M6281 Muscle weakness (generalized): Secondary | ICD-10-CM | POA: Diagnosis not present

## 2023-02-03 DIAGNOSIS — R2689 Other abnormalities of gait and mobility: Secondary | ICD-10-CM | POA: Diagnosis not present

## 2023-02-03 DIAGNOSIS — R2681 Unsteadiness on feet: Secondary | ICD-10-CM | POA: Diagnosis not present

## 2023-02-05 DIAGNOSIS — R2689 Other abnormalities of gait and mobility: Secondary | ICD-10-CM | POA: Diagnosis not present

## 2023-02-05 DIAGNOSIS — M6281 Muscle weakness (generalized): Secondary | ICD-10-CM | POA: Diagnosis not present

## 2023-02-05 DIAGNOSIS — R2681 Unsteadiness on feet: Secondary | ICD-10-CM | POA: Diagnosis not present

## 2023-02-10 DIAGNOSIS — M6281 Muscle weakness (generalized): Secondary | ICD-10-CM | POA: Diagnosis not present

## 2023-02-10 DIAGNOSIS — R2681 Unsteadiness on feet: Secondary | ICD-10-CM | POA: Diagnosis not present

## 2023-02-10 DIAGNOSIS — R2689 Other abnormalities of gait and mobility: Secondary | ICD-10-CM | POA: Diagnosis not present

## 2023-02-12 DIAGNOSIS — R2681 Unsteadiness on feet: Secondary | ICD-10-CM | POA: Diagnosis not present

## 2023-02-12 DIAGNOSIS — R2689 Other abnormalities of gait and mobility: Secondary | ICD-10-CM | POA: Diagnosis not present

## 2023-02-12 DIAGNOSIS — M6281 Muscle weakness (generalized): Secondary | ICD-10-CM | POA: Diagnosis not present

## 2023-02-16 DIAGNOSIS — R2689 Other abnormalities of gait and mobility: Secondary | ICD-10-CM | POA: Diagnosis not present

## 2023-02-16 DIAGNOSIS — R2681 Unsteadiness on feet: Secondary | ICD-10-CM | POA: Diagnosis not present

## 2023-02-16 DIAGNOSIS — M6281 Muscle weakness (generalized): Secondary | ICD-10-CM | POA: Diagnosis not present

## 2023-02-19 DIAGNOSIS — R2681 Unsteadiness on feet: Secondary | ICD-10-CM | POA: Diagnosis not present

## 2023-02-19 DIAGNOSIS — R2689 Other abnormalities of gait and mobility: Secondary | ICD-10-CM | POA: Diagnosis not present

## 2023-02-19 DIAGNOSIS — M6281 Muscle weakness (generalized): Secondary | ICD-10-CM | POA: Diagnosis not present

## 2023-02-23 DIAGNOSIS — R2681 Unsteadiness on feet: Secondary | ICD-10-CM | POA: Diagnosis not present

## 2023-02-23 DIAGNOSIS — R2689 Other abnormalities of gait and mobility: Secondary | ICD-10-CM | POA: Diagnosis not present

## 2023-02-23 DIAGNOSIS — M6281 Muscle weakness (generalized): Secondary | ICD-10-CM | POA: Diagnosis not present

## 2023-02-25 DIAGNOSIS — R2681 Unsteadiness on feet: Secondary | ICD-10-CM | POA: Diagnosis not present

## 2023-02-25 DIAGNOSIS — R2689 Other abnormalities of gait and mobility: Secondary | ICD-10-CM | POA: Diagnosis not present

## 2023-02-25 DIAGNOSIS — M6281 Muscle weakness (generalized): Secondary | ICD-10-CM | POA: Diagnosis not present

## 2023-03-03 DIAGNOSIS — R2681 Unsteadiness on feet: Secondary | ICD-10-CM | POA: Diagnosis not present

## 2023-03-03 DIAGNOSIS — R2689 Other abnormalities of gait and mobility: Secondary | ICD-10-CM | POA: Diagnosis not present

## 2023-03-03 DIAGNOSIS — M6281 Muscle weakness (generalized): Secondary | ICD-10-CM | POA: Diagnosis not present

## 2023-03-18 DIAGNOSIS — R2689 Other abnormalities of gait and mobility: Secondary | ICD-10-CM | POA: Diagnosis not present

## 2023-03-24 DIAGNOSIS — R2689 Other abnormalities of gait and mobility: Secondary | ICD-10-CM | POA: Diagnosis not present

## 2023-03-31 DIAGNOSIS — R2689 Other abnormalities of gait and mobility: Secondary | ICD-10-CM | POA: Diagnosis not present

## 2023-04-01 DIAGNOSIS — R2689 Other abnormalities of gait and mobility: Secondary | ICD-10-CM | POA: Diagnosis not present

## 2023-04-02 DIAGNOSIS — R2689 Other abnormalities of gait and mobility: Secondary | ICD-10-CM | POA: Diagnosis not present

## 2023-04-06 DIAGNOSIS — R2689 Other abnormalities of gait and mobility: Secondary | ICD-10-CM | POA: Diagnosis not present

## 2023-04-07 DIAGNOSIS — R2689 Other abnormalities of gait and mobility: Secondary | ICD-10-CM | POA: Diagnosis not present

## 2023-04-10 ENCOUNTER — Encounter: Payer: Self-pay | Admitting: Nurse Practitioner

## 2023-04-10 ENCOUNTER — Ambulatory Visit: Payer: Medicare PPO | Admitting: Nurse Practitioner

## 2023-04-10 VITALS — BP 126/80 | HR 63 | Temp 97.3°F | Ht 75.0 in | Wt 208.0 lb

## 2023-04-10 DIAGNOSIS — J449 Chronic obstructive pulmonary disease, unspecified: Secondary | ICD-10-CM | POA: Diagnosis not present

## 2023-04-10 DIAGNOSIS — Z66 Do not resuscitate: Secondary | ICD-10-CM

## 2023-04-10 DIAGNOSIS — Z89421 Acquired absence of other right toe(s): Secondary | ICD-10-CM | POA: Diagnosis not present

## 2023-04-10 DIAGNOSIS — I5032 Chronic diastolic (congestive) heart failure: Secondary | ICD-10-CM | POA: Diagnosis not present

## 2023-04-10 DIAGNOSIS — G609 Hereditary and idiopathic neuropathy, unspecified: Secondary | ICD-10-CM

## 2023-04-10 DIAGNOSIS — E871 Hypo-osmolality and hyponatremia: Secondary | ICD-10-CM | POA: Diagnosis not present

## 2023-04-10 DIAGNOSIS — K5900 Constipation, unspecified: Secondary | ICD-10-CM

## 2023-04-10 DIAGNOSIS — K7469 Other cirrhosis of liver: Secondary | ICD-10-CM | POA: Diagnosis not present

## 2023-04-10 DIAGNOSIS — B372 Candidiasis of skin and nail: Secondary | ICD-10-CM | POA: Diagnosis not present

## 2023-04-10 MED ORDER — NYSTATIN-TRIAMCINOLONE 100000-0.1 UNIT/GM-% EX OINT: 1.0000 | TOPICAL_OINTMENT | Freq: Two times a day (BID) | CUTANEOUS | 0 refills | Status: DC

## 2023-04-10 MED ORDER — NYSTATIN 100000 UNIT/GM EX POWD: 1.0000 | Freq: Two times a day (BID) | CUTANEOUS | 1 refills | Status: DC

## 2023-04-10 NOTE — Progress Notes (Unsigned)
Careteam: Patient Care Team: Sharon Seller, NP as PCP - General (Geriatric Medicine) Orbie Pyo, MD as PCP - Cardiology (Cardiology) Sherren Kerns, PT as Physical Therapist (Physical Therapy)  PLACE OF SERVICE:  [facility]  Advanced Directive information Does Patient Have a Medical Advance Directive?: Yes, Type of Advance Directive: Out of facility DNR (pink MOST or yellow form), Pre-existing out of facility DNR order (yellow form or pink MOST form): Pink MOST form placed in chart (order not valid for inpatient use), Does patient want to make changes to medical advance directive?: No - Patient declined  No Known Allergies  Chief Complaint  Patient presents with   Medical Management of Chronic Issues    6 month follow-up. Discus need for AWV(pending for 04/17/23), shingrix, and covid booster. Patient denies receiving any vaccines since last visit.       HPI: Patient is a 89 y.o. male for routine follow up.  Pt with hx of CHF, COPD.  Pt weighs himself every morning. On his scale he was 199lbs this morning.  Reports occasional shortness of breath but overall maintaining at baseline.   Eating well.   Reports his sleep is good.  Reports good QOL.   Nursing assistance for shower and to check feet. He lives at [place].  No wounds at this time.  Takes a colon support supplement to help with constipation.   He wants his thyroid checked- he wants a baseline.   Review of Systems:  Review of Systems  Constitutional:  Negative for chills, fever and weight loss.  HENT:  Negative for tinnitus.   Respiratory:  Negative for cough, sputum production and shortness of breath.   Cardiovascular:  Negative for chest pain, palpitations and leg swelling.  Gastrointestinal:  Negative for abdominal pain, constipation, diarrhea and heartburn.  Genitourinary:  Negative for dysuria, frequency and urgency.  Musculoskeletal:  Negative for back pain, falls, joint pain and  myalgias.  Skin: Negative.   Neurological:  Negative for dizziness and headaches.  Psychiatric/Behavioral:  Negative for depression and memory loss. The patient does not have insomnia.   ***  Past Medical History:  Diagnosis Date   Cellulitis of right foot 06/04/2022   Constipation    COPD (chronic obstructive pulmonary disease) (HCC)    COVID 05/27/2021   Gait abnormality 02/09/2020   Idiopathic neuropathy 06/04/2022   Neuropathy    Osteomyelitis of second toe of right foot (HCC) 06/04/2022   Peripheral neuropathy 09/30/2017   Pseudomonas aeruginosa infection 06/17/2022   SVT (supraventricular tachycardia)    Past Surgical History:  Procedure Laterality Date   AMPUTATION TOE Right 06/12/2022   Procedure: RIGHT 2ND AMPUTATION TOE, POSSIBLE RAY AMPUTATION;  Surgeon: Toni Arthurs, MD;  Location: [facility];  Service: Orthopedics;  Laterality: Right;  2nd toe amputation discarded per surgeon's VO   CYST REMOVAL NECK     HIP ARTHROPLASTY Left 12/24/2021   Procedure: ARTHROPLASTY BIPOLAR HIP (HEMIARTHROPLASTY);  Surgeon: Eldred Manges, MD;  Location: [facility];  Service: Orthopedics;  Laterality: Left;   Social History:   reports that he quit smoking about 2 years ago. His smoking use included cigarettes. He started smoking about 52 years ago. He has a 12.5 pack-year smoking history. He has never used smokeless tobacco. He reports current alcohol use of about 3.0 standard drinks of alcohol per week. He reports that he does not use drugs.  History reviewed. No pertinent family history.  Medications: Patient's Medications  New Prescriptions  No medications on file  Previous Medications   B COMPLEX VITAMINS (B COMPLEX PO)    Take 1 tablet by mouth daily.   COENZYME Q10-FISH OIL-VIT E (CO-Q 10 OMEGA-3 FISH OIL PO)    Take 1 tablet by mouth daily.  Modified Medications   No medications on file  Discontinued Medications   COENZYME Q10 (CO Q-10 PO)    Take 1 capsule by mouth  daily.   OMEGA-3 FATTY ACIDS (OMEGA 3 PO)    Take 1 capsule by mouth daily.    Physical Exam:  Vitals:   04/10/23 1334  BP: 126/80  Pulse: 63  Temp: (!) 97.3 F (36.3 C)  TempSrc: Temporal  SpO2: 92%  Weight: 208 lb (94.3 kg)  Height: 6\' 3"  (1.905 m)   Body mass index is 26 kg/m. Wt Readings from Last 3 Encounters:  04/10/23 208 lb (94.3 kg)  09/29/22 194 lb (88 kg)  08/21/22 202 lb (91.6 kg)    Physical Exam Constitutional:      General: He is not in acute distress.    Appearance: He is well-developed. He is not diaphoretic.  HENT:     Head: Normocephalic and atraumatic.     Right Ear: External ear normal.     Left Ear: External ear normal.     Mouth/Throat:     Pharynx: No oropharyngeal exudate.  Eyes:     Conjunctiva/sclera: Conjunctivae normal.     Pupils: Pupils are equal, round, and reactive to light.  Cardiovascular:     Rate and Rhythm: Normal rate and regular rhythm.     Heart sounds: Normal heart sounds.  Pulmonary:     Effort: Pulmonary effort is normal.     Breath sounds: Normal breath sounds.  Abdominal:     General: Bowel sounds are normal.     Palpations: Abdomen is soft.  Musculoskeletal:        General: No tenderness.     Cervical back: Normal range of motion and neck supple.     Right lower leg: No edema.     Left lower leg: No edema.  Skin:    General: Skin is warm and dry.     Findings: Rash present.  Neurological:     Mental Status: He is alert and oriented to person, place, and time.     Motor: Weakness present.     Gait: Gait abnormal.   ***  Labs reviewed: Basic Metabolic Panel: Recent Labs    05/24/22 1615 05/24/22 1850 05/28/22 1924 09/29/22 1602  NA 125* 126* 130* 136  K 4.4 4.6 4.9 4.9  CL 90* 91* 95* 98  CO2 26 28 28 30   GLUCOSE 119* 87 105* 90  BUN 20 18 16 15   CREATININE 1.16 1.06 0.90 0.87  CALCIUM 9.0 8.5* 8.9 9.2  TSH 2.119  --   --   --    Liver Function Tests: Recent Labs    04/21/22 1817  09/29/22 1602  AST 22 21  ALT 13 17  ALKPHOS 65  --   BILITOT 0.8 0.7  PROT 7.6 6.9  ALBUMIN 4.2  --    No results for input(s): "LIPASE", "AMYLASE" in the last 8760 hours. No results for input(s): "AMMONIA" in the last 8760 hours. CBC: Recent Labs    05/24/22 1615 05/28/22 1924 09/29/22 1602  WBC 8.6 8.4 6.8  NEUTROABS 6.7 6.3 4,617  HGB 14.9 14.8 14.7  HCT 42.3 43.8 43.1  MCV 90.2 93.6 94.1  PLT 189  200 203   Lipid Panel: No results for input(s): "CHOL", "HDL", "LDLCALC", "TRIG", "CHOLHDL", "LDLDIRECT" in the last 8760 hours. TSH: Recent Labs    05/24/22 1615  TSH 2.119   A1C: No results found for: "HGBA1C"   Assessment/Plan There are no diagnoses linked to this encounter.  No follow-ups on file.: ***  Mathilda Maguire K. Biagio Borg [facility] [phone]

## 2023-04-10 NOTE — Patient Instructions (Addendum)
Clean groin and chest thoroughly twice daily then dry and apply ointment twice daily for 1 week then start nystatin powder twice daily until resolves

## 2023-04-14 ENCOUNTER — Encounter: Payer: Self-pay | Admitting: Nurse Practitioner

## 2023-04-14 DIAGNOSIS — R2689 Other abnormalities of gait and mobility: Secondary | ICD-10-CM | POA: Diagnosis not present

## 2023-04-15 DIAGNOSIS — R2689 Other abnormalities of gait and mobility: Secondary | ICD-10-CM | POA: Diagnosis not present

## 2023-04-16 ENCOUNTER — Encounter: Payer: Self-pay | Admitting: Nurse Practitioner

## 2023-04-17 ENCOUNTER — Ambulatory Visit (INDEPENDENT_AMBULATORY_CARE_PROVIDER_SITE_OTHER): Payer: Medicare PPO | Admitting: Nurse Practitioner

## 2023-04-17 VITALS — BP 130/78 | HR 73 | Temp 97.3°F | Ht 75.0 in | Wt 205.0 lb

## 2023-04-17 DIAGNOSIS — B372 Candidiasis of skin and nail: Secondary | ICD-10-CM | POA: Diagnosis not present

## 2023-04-17 DIAGNOSIS — Z0000 Encounter for general adult medical examination without abnormal findings: Secondary | ICD-10-CM

## 2023-04-17 MED ORDER — NYSTATIN-TRIAMCINOLONE 100000-0.1 UNIT/GM-% EX OINT: 1.0000 | TOPICAL_OINTMENT | Freq: Two times a day (BID) | CUTANEOUS | 0 refills | Status: DC

## 2023-04-17 NOTE — Progress Notes (Signed)
Subjective:   Hector Beltran is a 89 y.o. male who presents for Medicare Annual/Subsequent preventive examination.  Visit Complete: In person  Patient Medicare AWV questionnaire was completed by the patient on 8/2; I have confirmed that all information answered by patient is correct and no changes since this date.  Review of Systems     Cardiac Risk Factors include: advanced age (>57men, >44 women);male gender     Objective:    Today's Vitals   04/16/23 1632 04/17/23 1442  BP: 130/78   Pulse: 73   Temp: (!) 97.3 F (36.3 C)   TempSrc: Temporal   SpO2: 97%   Weight: 205 lb (93 kg)   Height: 6\' 3"  (1.905 m)   PainSc:  0-No pain   Body mass index is 25.62 kg/m.     04/17/2023    1:59 PM 04/16/2023    4:32 PM 04/10/2023    1:35 PM 09/29/2022    3:13 PM 06/12/2022    9:09 AM 06/05/2022    3:02 PM 05/24/2022    2:38 PM  Advanced Directives  Does Patient Have a Medical Advance Directive? Yes Yes Yes Yes Yes Yes Yes  Type of Advance Directive Out of facility DNR (pink MOST or yellow form) Out of facility DNR (pink MOST or yellow form) Out of facility DNR (pink MOST or yellow form) Living will;Healthcare Power of [employer] of [place];Living will Healthcare Power of [place];Living will Out of facility DNR (pink MOST or yellow form)  Does patient want to make changes to medical advance directive? No - Patient declined No - Patient declined No - Patient declined No - Patient declined No - Patient declined No - Patient declined   Copy of Healthcare Power of Attorney in Chart?    Yes - validated most recent copy scanned in chart (See row information)     Pre-existing out of facility DNR order (yellow form or pink MOST form) Yellow form placed in chart (order not valid for inpatient use);Pink MOST/Yellow Form most recent copy in chart - Physician notified to receive inpatient order Pink MOST form placed in chart (order not valid for inpatient use) Pink MOST form placed in chart  (order not valid for inpatient use)        Current Medications (verified) Outpatient Encounter Medications as of 04/17/2023  Medication Sig   B Complex Vitamins (B COMPLEX PO) Take 1 tablet by mouth daily.   Coenzyme Q10-Fish Oil-Vit E (CO-Q 10 OMEGA-3 FISH OIL PO) Take 1 tablet by mouth daily.   nystatin (MYCOSTATIN/NYSTOP) powder Apply 1 Application topically 2 (two) times daily.   [DISCONTINUED] nystatin-triamcinolone ointment (MYCOLOG) Apply 1 Application topically 2 (two) times daily.   nystatin-triamcinolone ointment (MYCOLOG) Apply 1 Application topically 2 (two) times daily.   No facility-administered encounter medications on file as of 04/17/2023.    Allergies (verified) Patient has no known allergies.   History: Past Medical History:  Diagnosis Date   Cellulitis of right foot 06/04/2022   Constipation    COPD (chronic obstructive pulmonary disease) (HCC)    COVID 05/27/2021   Gait abnormality 02/09/2020   Idiopathic neuropathy 06/04/2022   Neuropathy    Osteomyelitis of second toe of right foot (HCC) 06/04/2022   Peripheral neuropathy 09/30/2017   Pseudomonas aeruginosa infection 06/17/2022   SVT (supraventricular tachycardia)    Past Surgical History:  Procedure Laterality Date   AMPUTATION TOE Right 06/12/2022   Procedure: RIGHT 2ND AMPUTATION TOE, POSSIBLE RAY AMPUTATION;  Surgeon: Toni Arthurs,  MD;  Location: [facility];  Service: Orthopedics;  Laterality: Right;  2nd toe amputation discarded per surgeon's VO   CYST REMOVAL NECK     HIP ARTHROPLASTY Left 12/24/2021   Procedure: ARTHROPLASTY BIPOLAR HIP (HEMIARTHROPLASTY);  Surgeon: Eldred Manges, MD;  Location: [facility];  Service: Orthopedics;  Laterality: Left;   No family history on file. Social History   Socioeconomic History   Marital status: Widowed    Spouse name: Not on file   Number of children: 2   Years of education: College   Highest education level: Not on file  Occupational History    Occupation: Retired  Tobacco Use   Smoking status: Former    Current packs/day: 0.00    Average packs/day: 0.3 packs/day for 50.0 years (12.5 ttl pk-yrs)    Types: Cigarettes    Start date: 08/15/1970    Quit date: 08/15/2020    Years since quitting: 2.6   Smokeless tobacco: Never   Tobacco comments:    2 packs per week, quit 08/2020  Vaping Use   Vaping status: Never Used  Substance and Sexual Activity   Alcohol use: Yes    Alcohol/week: 3.0 standard drinks of alcohol    Types: 1 Glasses of wine, 1 Cans of beer, 1 Shots of liquor per week    Comment: rarely   Drug use: No   Sexual activity: Not on file  Other Topics Concern   Not on file  Social History Narrative   Lives alone at [place]   Caffeine use: Coffee daily   Right handed    Social Determinants of Health   Financial Resource Strain: Not on file  Food Insecurity: Not on file  Transportation Needs: Not on file  Physical Activity: Not on file  Stress: Not on file  Social Connections: Not on file    Tobacco Counseling Counseling given: Not Answered Tobacco comments: 2 packs per week, quit 08/2020   Clinical Intake:  Pre-visit preparation completed: Yes  Pain : 0-10 Pain Score: 0-No pain     BMI - recorded: 25 Nutritional Status: BMI 25 -29 Overweight Diabetes: No  How often do you need to have someone help you when you read instructions, pamphlets, or other written materials from your doctor or pharmacy?: 1 - Never         Activities of Daily Living    04/17/2023    2:41 PM 04/17/2023   12:20 PM  In your present state of health, do you have any difficulty performing the following activities:  Hearing? 0 0  Vision? 0 0  Difficulty concentrating or making decisions? 0 0  Walking or climbing stairs? 0 1  Dressing or bathing? 0 0  Doing errands, shopping? 1 1  Preparing Food and eating ? N N  Using the Toilet? N N  In the past six months, have you accidently leaked urine? N N  Do you have  problems with loss of bowel control? N N  Managing your Medications? N N  Managing your Finances? N N  Housekeeping or managing your Housekeeping? N N    Patient Care Team: Sharon Seller, NP as PCP - General (Geriatric Medicine) Orbie Pyo, MD as PCP - Cardiology (Cardiology) Sherren Kerns, PT as Physical Therapist (Physical Therapy)  Indicate any recent Medical Services you may have received from other than [facility] providers in the past year (date may be approximate).     Assessment:   This is a routine wellness  examination for Laurin.  Hearing/Vision screen Hearing Screening - Comments:: Patient with no hearing concerns  Vision Screening - Comments:: Last eye exam less than 12 months ago and pending yearly exam coming up. [facility]    Dietary issues and exercise activities discussed:     Goals Addressed             This Visit's Progress    Patient Stated       Improved distance of walking       Depression Screen    04/17/2023    1:57 PM 04/10/2023    2:22 PM 09/29/2022    3:13 PM 08/21/2022   11:02 AM 06/04/2022   10:11 AM  PHQ 2/9 Scores  PHQ - 2 Score 0 0 0 0 0    Fall Risk    04/17/2023    2:00 PM 04/17/2023   12:20 PM 04/16/2023    4:33 PM 04/10/2023    2:21 PM 08/21/2022   11:01 AM  Fall Risk   Falls in the past year? 0 0 0 0 0  Number falls in past yr: 0 0 0 0   Injury with Fall? 0 0 0 0   Risk for fall due to : No Fall Risks  No Fall Risks No Fall Risks   Follow up Falls evaluation completed  Falls evaluation completed Falls evaluation completed Falls evaluation completed    MEDICARE RISK AT HOME:   TIMED UP AND GO:  Was the test performed?  No    Cognitive Function:    04/17/2023    2:02 PM  MMSE - Mini Mental State Exam  Orientation to time 4  Orientation to Place 4  Registration 3  Attention/ Calculation 5  Recall 2  Language- name 2 objects 2  Language- repeat 1  Language- follow 3 step command 3  Language- read  & follow direction 1  Write a sentence 1  Copy design 1  Total score 27        Immunizations Immunization History  Administered Date(s) Administered   Influenza-Unspecified 08/14/2022   Moderna SARS-COV2 Booster Vaccination 01/25/2020, 07/19/2020   Moderna Sars-Covid-2 Vaccination 10/06/2019, 11/03/2019   Pfizer Covid-19 Vaccine Bivalent Booster 19yrs & up 06/27/2021   Tdap 09/26/2015, 09/25/2021    TDAP status: Up to date  Flu Vaccine status: Due, Education has been provided regarding the importance of this vaccine. Advised may receive this vaccine at local pharmacy or Health Dept. Aware to provide a copy of the vaccination record if obtained from local pharmacy or Health Dept. Verbalized acceptance and understanding.  Pneumococcal vaccine status: Declined,  Education has been provided regarding the importance of this vaccine but patient still declined. Advised may receive this vaccine at local pharmacy or Health Dept. Aware to provide a copy of the vaccination record if obtained from local pharmacy or Health Dept. Verbalized acceptance and understanding.   Covid-19 vaccine status: Information provided on how to obtain vaccines.   Qualifies for Shingles Vaccine? Yes   Zostavax completed No   Shingrix Completed?: No.    Education has been provided regarding the importance of this vaccine. Patient has been advised to call insurance company to determine out of pocket expense if they have not yet received this vaccine. Advised may also receive vaccine at local pharmacy or Health Dept. Verbalized acceptance and understanding.  Screening Tests Health Maintenance  Topic Date Due   Zoster Vaccines- Shingrix (1 of 2) Never done   COVID-19 Vaccine (6 - 2023-24 season)  05/16/2022   INFLUENZA VACCINE  04/16/2023   Pneumonia Vaccine 76+ Years old (1 of 1 - PCV) 09/30/2023 (Originally 10/02/1994)   Medicare Annual Wellness (AWV)  04/16/2024   DTaP/Tdap/Td (3 - Td or Tdap) 09/26/2031   HPV  VACCINES  Aged Out    Health Maintenance  Health Maintenance Due  Topic Date Due   Zoster Vaccines- Shingrix (1 of 2) Never done   COVID-19 Vaccine (6 - 2023-24 season) 05/16/2022   INFLUENZA VACCINE  04/16/2023    Colorectal cancer screening: No longer required.   Lung Cancer Screening: (Low Dose CT Chest recommended if Age 26-80 years, 20 pack-year currently smoking OR have quit w/in 15years.) does not qualify.   Lung Cancer Screening Referral: na  Additional Screening:  Hepatitis C Screening: does not qualify  Vision Screening: Recommended annual ophthalmology exams for early detection of glaucoma and other disorders of the eye. Is the patient up to date with their annual eye exam?  Yes  Who is the provider or what is the name of the office in which the patient attends annual eye exams? [facility] If pt is not established with a provider, would they like to be referred to a provider to establish care? No .   Dental Screening: Recommended annual dental exams for proper oral hygiene    Community Resource Referral / Chronic Care Management: CRR required this visit?  No   CCM required this visit?  No     Plan:     I have personally reviewed and noted the following in the patient's chart:   Medical and social history Use of alcohol, tobacco or illicit drugs  Current medications and supplements including opioid prescriptions. Patient is not currently taking opioid prescriptions. Functional ability and status Nutritional status Physical activity Advanced directives List of other physicians Hospitalizations, surgeries, and ER visits in previous 12 months Vitals Screenings to include cognitive, depression, and falls Referrals and appointments  In addition, I have reviewed and discussed with patient certain preventive protocols, quality metrics, and best practice recommendations. A written personalized care plan for preventive services as well as general  preventive health recommendations were provided to patient.     Sharon Seller, NP   04/17/2023

## 2023-04-17 NOTE — Patient Instructions (Signed)
  Mr. Dawson , Thank you for taking time to come for your Medicare Wellness Visit. I appreciate your ongoing commitment to your health goals. Please review the following plan we discussed and let me know if I can assist you in the future.   These are the goals we discussed:  Goals      Patient Stated     Improved distance of walking        This is a list of the screening recommended for you and due dates:  Health Maintenance  Topic Date Due   Zoster (Shingles) Vaccine (1 of 2) Never done   COVID-19 Vaccine (6 - 2023-24 season) 05/16/2022   Flu Shot  04/16/2023   Pneumonia Vaccine (1 of 1 - PCV) 09/30/2023*   Medicare Annual Wellness Visit  04/16/2024   DTaP/Tdap/Td vaccine (3 - Td or Tdap) 09/26/2031   HPV Vaccine  Aged Out  *Topic was postponed. The date shown is not the original due date.    To get shingles vaccine at local pharmacy To get FLU shot in [place] with  COVID booster

## 2023-04-19 ENCOUNTER — Encounter: Payer: Self-pay | Admitting: Nurse Practitioner

## 2023-04-21 ENCOUNTER — Encounter: Payer: Medicare PPO | Admitting: Nurse Practitioner

## 2023-04-22 DIAGNOSIS — R2689 Other abnormalities of gait and mobility: Secondary | ICD-10-CM | POA: Diagnosis not present

## 2023-04-23 DIAGNOSIS — R2689 Other abnormalities of gait and mobility: Secondary | ICD-10-CM | POA: Diagnosis not present

## 2023-04-28 DIAGNOSIS — R2689 Other abnormalities of gait and mobility: Secondary | ICD-10-CM | POA: Diagnosis not present

## 2023-04-29 ENCOUNTER — Telehealth: Payer: Self-pay | Admitting: Neurology

## 2023-04-29 NOTE — Telephone Encounter (Signed)
LVM and sent mychart msg informing pt of need to reschedule 07/02/23 appt - NP out

## 2023-04-30 DIAGNOSIS — H52203 Unspecified astigmatism, bilateral: Secondary | ICD-10-CM | POA: Diagnosis not present

## 2023-04-30 DIAGNOSIS — H2513 Age-related nuclear cataract, bilateral: Secondary | ICD-10-CM | POA: Diagnosis not present

## 2023-05-01 DIAGNOSIS — R2689 Other abnormalities of gait and mobility: Secondary | ICD-10-CM | POA: Diagnosis not present

## 2023-05-06 DIAGNOSIS — R2689 Other abnormalities of gait and mobility: Secondary | ICD-10-CM | POA: Diagnosis not present

## 2023-05-07 DIAGNOSIS — R2689 Other abnormalities of gait and mobility: Secondary | ICD-10-CM | POA: Diagnosis not present

## 2023-05-12 DIAGNOSIS — R2689 Other abnormalities of gait and mobility: Secondary | ICD-10-CM | POA: Diagnosis not present

## 2023-05-14 DIAGNOSIS — R2689 Other abnormalities of gait and mobility: Secondary | ICD-10-CM | POA: Diagnosis not present
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
## 2023-05-19 ENCOUNTER — Encounter: Payer: Self-pay | Admitting: Neurology

## 2023-05-19 ENCOUNTER — Ambulatory Visit: Payer: Medicare PPO | Admitting: Neurology

## 2023-05-19 VITALS — BP 120/80 | HR 72 | Ht 76.0 in | Wt 200.4 lb

## 2023-05-19 DIAGNOSIS — R269 Unspecified abnormalities of gait and mobility: Secondary | ICD-10-CM

## 2023-05-19 DIAGNOSIS — G609 Hereditary and idiopathic neuropathy, unspecified: Secondary | ICD-10-CM | POA: Diagnosis not present

## 2023-05-19 DIAGNOSIS — R2689 Other abnormalities of gait and mobility: Secondary | ICD-10-CM | POA: Diagnosis not present

## 2023-05-21 DIAGNOSIS — R2689 Other abnormalities of gait and mobility: Secondary | ICD-10-CM | POA: Diagnosis not present

## 2023-05-26 DIAGNOSIS — R2689 Other abnormalities of gait and mobility: Secondary | ICD-10-CM | POA: Diagnosis not present

## 2023-05-29 DIAGNOSIS — R2689 Other abnormalities of gait and mobility: Secondary | ICD-10-CM | POA: Diagnosis not present

## 2023-06-03 DIAGNOSIS — R2689 Other abnormalities of gait and mobility: Secondary | ICD-10-CM | POA: Diagnosis not present

## 2023-06-09 DIAGNOSIS — R2689 Other abnormalities of gait and mobility: Secondary | ICD-10-CM | POA: Diagnosis not present

## 2023-06-11 DIAGNOSIS — R2689 Other abnormalities of gait and mobility: Secondary | ICD-10-CM | POA: Diagnosis not present

## 2023-06-15 DIAGNOSIS — R2689 Other abnormalities of gait and mobility: Secondary | ICD-10-CM | POA: Diagnosis not present

## 2023-06-18 DIAGNOSIS — R2689 Other abnormalities of gait and mobility: Secondary | ICD-10-CM | POA: Diagnosis not present

## 2023-06-23 DIAGNOSIS — R2681 Unsteadiness on feet: Secondary | ICD-10-CM | POA: Diagnosis not present

## 2023-06-23 DIAGNOSIS — R278 Other lack of coordination: Secondary | ICD-10-CM | POA: Diagnosis not present

## 2023-06-24 DIAGNOSIS — R2689 Other abnormalities of gait and mobility: Secondary | ICD-10-CM | POA: Diagnosis not present

## 2023-06-25 DIAGNOSIS — R2689 Other abnormalities of gait and mobility: Secondary | ICD-10-CM | POA: Diagnosis not present

## 2023-06-29 DIAGNOSIS — R2689 Other abnormalities of gait and mobility: Secondary | ICD-10-CM | POA: Diagnosis not present

## 2023-07-01 DIAGNOSIS — R278 Other lack of coordination: Secondary | ICD-10-CM | POA: Diagnosis not present

## 2023-07-01 DIAGNOSIS — R2681 Unsteadiness on feet: Secondary | ICD-10-CM | POA: Diagnosis not present

## 2023-07-02 ENCOUNTER — Ambulatory Visit: Payer: Medicare PPO | Admitting: Neurology

## 2023-07-02 DIAGNOSIS — R2689 Other abnormalities of gait and mobility: Secondary | ICD-10-CM | POA: Diagnosis not present

## 2023-07-03 DIAGNOSIS — R278 Other lack of coordination: Secondary | ICD-10-CM | POA: Diagnosis not present

## 2023-07-03 DIAGNOSIS — R2681 Unsteadiness on feet: Secondary | ICD-10-CM | POA: Diagnosis not present

## 2023-07-07 DIAGNOSIS — R278 Other lack of coordination: Secondary | ICD-10-CM | POA: Diagnosis not present

## 2023-07-07 DIAGNOSIS — R2681 Unsteadiness on feet: Secondary | ICD-10-CM | POA: Diagnosis not present

## 2023-07-07 DIAGNOSIS — R2689 Other abnormalities of gait and mobility: Secondary | ICD-10-CM | POA: Diagnosis not present

## 2023-07-09 DIAGNOSIS — R2681 Unsteadiness on feet: Secondary | ICD-10-CM | POA: Diagnosis not present

## 2023-07-09 DIAGNOSIS — R278 Other lack of coordination: Secondary | ICD-10-CM | POA: Diagnosis not present

## 2023-07-10 DIAGNOSIS — R2689 Other abnormalities of gait and mobility: Secondary | ICD-10-CM | POA: Diagnosis not present

## 2023-07-13 DIAGNOSIS — R2689 Other abnormalities of gait and mobility: Secondary | ICD-10-CM | POA: Diagnosis not present

## 2023-07-14 DIAGNOSIS — L570 Actinic keratosis: Secondary | ICD-10-CM | POA: Diagnosis not present

## 2023-07-14 DIAGNOSIS — D485 Neoplasm of uncertain behavior of skin: Secondary | ICD-10-CM | POA: Diagnosis not present

## 2023-07-14 DIAGNOSIS — D1801 Hemangioma of skin and subcutaneous tissue: Secondary | ICD-10-CM | POA: Diagnosis not present

## 2023-07-14 DIAGNOSIS — L821 Other seborrheic keratosis: Secondary | ICD-10-CM | POA: Diagnosis not present

## 2023-07-14 DIAGNOSIS — B372 Candidiasis of skin and nail: Secondary | ICD-10-CM | POA: Diagnosis not present

## 2023-07-14 DIAGNOSIS — C44619 Basal cell carcinoma of skin of left upper limb, including shoulder: Secondary | ICD-10-CM | POA: Diagnosis not present

## 2023-07-14 DIAGNOSIS — Z85828 Personal history of other malignant neoplasm of skin: Secondary | ICD-10-CM | POA: Diagnosis not present

## 2023-07-14 DIAGNOSIS — R2681 Unsteadiness on feet: Secondary | ICD-10-CM | POA: Diagnosis not present

## 2023-07-14 DIAGNOSIS — R278 Other lack of coordination: Secondary | ICD-10-CM | POA: Diagnosis not present

## 2023-07-15 DIAGNOSIS — R2681 Unsteadiness on feet: Secondary | ICD-10-CM | POA: Diagnosis not present

## 2023-07-15 DIAGNOSIS — R278 Other lack of coordination: Secondary | ICD-10-CM | POA: Diagnosis not present

## 2023-07-16 DIAGNOSIS — R2689 Other abnormalities of gait and mobility: Secondary | ICD-10-CM | POA: Diagnosis not present

## 2023-07-20 DIAGNOSIS — R278 Other lack of coordination: Secondary | ICD-10-CM | POA: Diagnosis not present

## 2023-07-20 DIAGNOSIS — R2681 Unsteadiness on feet: Secondary | ICD-10-CM | POA: Diagnosis not present

## 2023-07-22 DIAGNOSIS — R2681 Unsteadiness on feet: Secondary | ICD-10-CM | POA: Diagnosis not present

## 2023-07-22 DIAGNOSIS — R278 Other lack of coordination: Secondary | ICD-10-CM | POA: Diagnosis not present

## 2023-07-23 DIAGNOSIS — R2689 Other abnormalities of gait and mobility: Secondary | ICD-10-CM | POA: Diagnosis not present

## 2023-07-29 DIAGNOSIS — R2689 Other abnormalities of gait and mobility: Secondary | ICD-10-CM | POA: Diagnosis not present

## 2023-07-29 DIAGNOSIS — R2681 Unsteadiness on feet: Secondary | ICD-10-CM | POA: Diagnosis not present

## 2023-07-29 DIAGNOSIS — R278 Other lack of coordination: Secondary | ICD-10-CM | POA: Diagnosis not present

## 2023-07-30 DIAGNOSIS — R2681 Unsteadiness on feet: Secondary | ICD-10-CM | POA: Diagnosis not present

## 2023-07-30 DIAGNOSIS — R2689 Other abnormalities of gait and mobility: Secondary | ICD-10-CM | POA: Diagnosis not present

## 2023-07-30 DIAGNOSIS — R278 Other lack of coordination: Secondary | ICD-10-CM | POA: Diagnosis not present
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
## 2023-08-03 DIAGNOSIS — R2681 Unsteadiness on feet: Secondary | ICD-10-CM | POA: Diagnosis not present

## 2023-08-03 DIAGNOSIS — R278 Other lack of coordination: Secondary | ICD-10-CM | POA: Diagnosis not present

## 2023-08-04 DIAGNOSIS — R2689 Other abnormalities of gait and mobility: Secondary | ICD-10-CM | POA: Diagnosis not present

## 2023-08-04 DIAGNOSIS — R278 Other lack of coordination: Secondary | ICD-10-CM | POA: Diagnosis not present

## 2023-08-04 DIAGNOSIS — R2681 Unsteadiness on feet: Secondary | ICD-10-CM | POA: Diagnosis not present

## 2023-08-06 DIAGNOSIS — R2689 Other abnormalities of gait and mobility: Secondary | ICD-10-CM | POA: Diagnosis not present

## 2023-08-07 DIAGNOSIS — R278 Other lack of coordination: Secondary | ICD-10-CM | POA: Diagnosis not present

## 2023-08-07 DIAGNOSIS — R2681 Unsteadiness on feet: Secondary | ICD-10-CM | POA: Diagnosis not present

## 2023-08-10 DIAGNOSIS — R2689 Other abnormalities of gait and mobility: Secondary | ICD-10-CM | POA: Diagnosis not present

## 2023-08-10 DIAGNOSIS — R2681 Unsteadiness on feet: Secondary | ICD-10-CM | POA: Diagnosis not present

## 2023-08-10 DIAGNOSIS — R278 Other lack of coordination: Secondary | ICD-10-CM | POA: Diagnosis not present

## 2023-08-11 DIAGNOSIS — R278 Other lack of coordination: Secondary | ICD-10-CM | POA: Diagnosis not present

## 2023-08-11 DIAGNOSIS — R2681 Unsteadiness on feet: Secondary | ICD-10-CM | POA: Diagnosis not present

## 2023-08-12 DIAGNOSIS — R2681 Unsteadiness on feet: Secondary | ICD-10-CM | POA: Diagnosis not present

## 2023-08-12 DIAGNOSIS — R278 Other lack of coordination: Secondary | ICD-10-CM | POA: Diagnosis not present

## 2023-08-12 DIAGNOSIS — R2689 Other abnormalities of gait and mobility: Secondary | ICD-10-CM | POA: Diagnosis not present

## 2023-08-18 DIAGNOSIS — R2689 Other abnormalities of gait and mobility: Secondary | ICD-10-CM | POA: Diagnosis not present

## 2023-08-20 DIAGNOSIS — R278 Other lack of coordination: Secondary | ICD-10-CM | POA: Diagnosis not present

## 2023-08-20 DIAGNOSIS — R2689 Other abnormalities of gait and mobility: Secondary | ICD-10-CM | POA: Diagnosis not present

## 2023-08-20 DIAGNOSIS — R2681 Unsteadiness on feet: Secondary | ICD-10-CM | POA: Diagnosis not present

## 2023-08-24 DIAGNOSIS — R278 Other lack of coordination: Secondary | ICD-10-CM | POA: Diagnosis not present

## 2023-08-24 DIAGNOSIS — R2681 Unsteadiness on feet: Secondary | ICD-10-CM | POA: Diagnosis not present

## 2023-08-25 DIAGNOSIS — R2689 Other abnormalities of gait and mobility: Secondary | ICD-10-CM | POA: Diagnosis not present

## 2023-08-26 DIAGNOSIS — R2681 Unsteadiness on feet: Secondary | ICD-10-CM | POA: Diagnosis not present

## 2023-08-26 DIAGNOSIS — R278 Other lack of coordination: Secondary | ICD-10-CM | POA: Diagnosis not present

## 2023-08-27 DIAGNOSIS — R278 Other lack of coordination: Secondary | ICD-10-CM | POA: Diagnosis not present

## 2023-08-27 DIAGNOSIS — R2681 Unsteadiness on feet: Secondary | ICD-10-CM | POA: Diagnosis not present

## 2023-08-28 DIAGNOSIS — R2689 Other abnormalities of gait and mobility: Secondary | ICD-10-CM | POA: Diagnosis not present

## 2023-08-31 DIAGNOSIS — R2681 Unsteadiness on feet: Secondary | ICD-10-CM | POA: Diagnosis not present

## 2023-08-31 DIAGNOSIS — R278 Other lack of coordination: Secondary | ICD-10-CM | POA: Diagnosis not present

## 2023-09-01 DIAGNOSIS — R2689 Other abnormalities of gait and mobility: Secondary | ICD-10-CM | POA: Diagnosis not present

## 2023-09-02 DIAGNOSIS — R278 Other lack of coordination: Secondary | ICD-10-CM | POA: Diagnosis not present

## 2023-09-02 DIAGNOSIS — R2681 Unsteadiness on feet: Secondary | ICD-10-CM | POA: Diagnosis not present

## 2023-09-03 DIAGNOSIS — R2689 Other abnormalities of gait and mobility: Secondary | ICD-10-CM | POA: Diagnosis not present

## 2023-09-07 DIAGNOSIS — R278 Other lack of coordination: Secondary | ICD-10-CM | POA: Diagnosis not present

## 2023-09-07 DIAGNOSIS — R2681 Unsteadiness on feet: Secondary | ICD-10-CM | POA: Diagnosis not present

## 2023-09-08 DIAGNOSIS — R2681 Unsteadiness on feet: Secondary | ICD-10-CM | POA: Diagnosis not present

## 2023-09-08 DIAGNOSIS — R278 Other lack of coordination: Secondary | ICD-10-CM | POA: Diagnosis not present

## 2023-09-11 DIAGNOSIS — R2689 Other abnormalities of gait and mobility: Secondary | ICD-10-CM | POA: Diagnosis not present

## 2023-09-14 DIAGNOSIS — R2689 Other abnormalities of gait and mobility: Secondary | ICD-10-CM | POA: Diagnosis not present

## 2023-09-17 DIAGNOSIS — R278 Other lack of coordination: Secondary | ICD-10-CM | POA: Diagnosis not present

## 2023-09-17 DIAGNOSIS — R2689 Other abnormalities of gait and mobility: Secondary | ICD-10-CM | POA: Diagnosis not present

## 2023-09-17 DIAGNOSIS — R2681 Unsteadiness on feet: Secondary | ICD-10-CM | POA: Diagnosis not present

## 2023-09-18 DIAGNOSIS — R278 Other lack of coordination: Secondary | ICD-10-CM | POA: Diagnosis not present

## 2023-09-18 DIAGNOSIS — R2681 Unsteadiness on feet: Secondary | ICD-10-CM | POA: Diagnosis not present

## 2023-09-21 DIAGNOSIS — R2689 Other abnormalities of gait and mobility: Secondary | ICD-10-CM | POA: Diagnosis not present

## 2023-09-22 DIAGNOSIS — R278 Other lack of coordination: Secondary | ICD-10-CM | POA: Diagnosis not present

## 2023-09-22 DIAGNOSIS — R2681 Unsteadiness on feet: Secondary | ICD-10-CM | POA: Diagnosis not present

## 2023-09-24 DIAGNOSIS — R278 Other lack of coordination: Secondary | ICD-10-CM | POA: Diagnosis not present

## 2023-09-24 DIAGNOSIS — R2681 Unsteadiness on feet: Secondary | ICD-10-CM | POA: Diagnosis not present

## 2023-09-29 DIAGNOSIS — R278 Other lack of coordination: Secondary | ICD-10-CM | POA: Diagnosis not present

## 2023-09-29 DIAGNOSIS — R2681 Unsteadiness on feet: Secondary | ICD-10-CM | POA: Diagnosis not present

## 2023-10-01 DIAGNOSIS — R278 Other lack of coordination: Secondary | ICD-10-CM | POA: Diagnosis not present

## 2023-10-01 DIAGNOSIS — R2681 Unsteadiness on feet: Secondary | ICD-10-CM | POA: Diagnosis not present

## 2023-10-16 ENCOUNTER — Telehealth: Payer: Self-pay | Admitting: Nurse Practitioner

## 2023-10-16 ENCOUNTER — Ambulatory Visit: Payer: Medicare PPO | Admitting: Nurse Practitioner

## 2023-10-16 NOTE — Telephone Encounter (Signed)
We received  the in basket note from the patient to say he wants to reschedule his appointment with Shanda Bumps. He could not come to his appointment on 10/16/23 he was quarantined due to a virus at his facility. I left 1 message and the 2nd call spoke to patient and he stated that he will call us back to schedule.

## 2023-11-12 DIAGNOSIS — R2681 Unsteadiness on feet: Secondary | ICD-10-CM | POA: Diagnosis not present

## 2023-11-12 DIAGNOSIS — R2689 Other abnormalities of gait and mobility: Secondary | ICD-10-CM | POA: Diagnosis not present

## 2023-11-12 DIAGNOSIS — M25521 Pain in right elbow: Secondary | ICD-10-CM | POA: Diagnosis not present

## 2023-11-17 DIAGNOSIS — R2689 Other abnormalities of gait and mobility: Secondary | ICD-10-CM | POA: Diagnosis not present

## 2023-11-17 DIAGNOSIS — M25521 Pain in right elbow: Secondary | ICD-10-CM | POA: Diagnosis not present

## 2023-11-17 DIAGNOSIS — R2681 Unsteadiness on feet: Secondary | ICD-10-CM | POA: Diagnosis not present

## 2023-11-18 DIAGNOSIS — R2681 Unsteadiness on feet: Secondary | ICD-10-CM | POA: Diagnosis not present

## 2023-11-18 DIAGNOSIS — M25521 Pain in right elbow: Secondary | ICD-10-CM | POA: Diagnosis not present

## 2023-11-18 DIAGNOSIS — R2689 Other abnormalities of gait and mobility: Secondary | ICD-10-CM | POA: Diagnosis not present

## 2023-11-19 DIAGNOSIS — M79675 Pain in left toe(s): Secondary | ICD-10-CM | POA: Diagnosis not present

## 2023-11-19 DIAGNOSIS — D2372 Other benign neoplasm of skin of left lower limb, including hip: Secondary | ICD-10-CM | POA: Diagnosis not present

## 2023-11-19 DIAGNOSIS — R2681 Unsteadiness on feet: Secondary | ICD-10-CM | POA: Diagnosis not present

## 2023-11-19 DIAGNOSIS — M25521 Pain in right elbow: Secondary | ICD-10-CM | POA: Diagnosis not present

## 2023-11-19 DIAGNOSIS — R2689 Other abnormalities of gait and mobility: Secondary | ICD-10-CM | POA: Diagnosis not present

## 2023-11-19 DIAGNOSIS — M2012 Hallux valgus (acquired), left foot: Secondary | ICD-10-CM | POA: Diagnosis not present

## 2023-11-19 DIAGNOSIS — B351 Tinea unguium: Secondary | ICD-10-CM | POA: Diagnosis not present

## 2023-11-24 DIAGNOSIS — R2681 Unsteadiness on feet: Secondary | ICD-10-CM | POA: Diagnosis not present

## 2023-11-24 DIAGNOSIS — M25521 Pain in right elbow: Secondary | ICD-10-CM | POA: Diagnosis not present

## 2023-11-24 DIAGNOSIS — R2689 Other abnormalities of gait and mobility: Secondary | ICD-10-CM | POA: Diagnosis not present

## 2023-11-25 DIAGNOSIS — M25521 Pain in right elbow: Secondary | ICD-10-CM | POA: Diagnosis not present

## 2023-11-25 DIAGNOSIS — R2681 Unsteadiness on feet: Secondary | ICD-10-CM | POA: Diagnosis not present

## 2023-11-25 DIAGNOSIS — R2689 Other abnormalities of gait and mobility: Secondary | ICD-10-CM | POA: Diagnosis not present

## 2023-11-26 DIAGNOSIS — R2681 Unsteadiness on feet: Secondary | ICD-10-CM | POA: Diagnosis not present

## 2023-11-26 DIAGNOSIS — M25521 Pain in right elbow: Secondary | ICD-10-CM | POA: Diagnosis not present

## 2023-11-26 DIAGNOSIS — R2689 Other abnormalities of gait and mobility: Secondary | ICD-10-CM | POA: Diagnosis not present

## 2023-12-02 DIAGNOSIS — R2689 Other abnormalities of gait and mobility: Secondary | ICD-10-CM | POA: Diagnosis not present

## 2023-12-02 DIAGNOSIS — M25521 Pain in right elbow: Secondary | ICD-10-CM | POA: Diagnosis not present

## 2023-12-02 DIAGNOSIS — R2681 Unsteadiness on feet: Secondary | ICD-10-CM | POA: Diagnosis not present

## 2023-12-03 DIAGNOSIS — R2689 Other abnormalities of gait and mobility: Secondary | ICD-10-CM | POA: Diagnosis not present

## 2023-12-03 DIAGNOSIS — M25521 Pain in right elbow: Secondary | ICD-10-CM | POA: Diagnosis not present

## 2023-12-03 DIAGNOSIS — R2681 Unsteadiness on feet: Secondary | ICD-10-CM | POA: Diagnosis not present

## 2023-12-04 DIAGNOSIS — M25521 Pain in right elbow: Secondary | ICD-10-CM | POA: Diagnosis not present

## 2023-12-04 DIAGNOSIS — R2681 Unsteadiness on feet: Secondary | ICD-10-CM | POA: Diagnosis not present

## 2023-12-04 DIAGNOSIS — R2689 Other abnormalities of gait and mobility: Secondary | ICD-10-CM | POA: Diagnosis not present

## 2023-12-09 DIAGNOSIS — M25521 Pain in right elbow: Secondary | ICD-10-CM | POA: Diagnosis not present

## 2023-12-09 DIAGNOSIS — R2689 Other abnormalities of gait and mobility: Secondary | ICD-10-CM | POA: Diagnosis not present

## 2023-12-09 DIAGNOSIS — R2681 Unsteadiness on feet: Secondary | ICD-10-CM | POA: Diagnosis not present

## 2023-12-10 DIAGNOSIS — R2689 Other abnormalities of gait and mobility: Secondary | ICD-10-CM | POA: Diagnosis not present

## 2023-12-10 DIAGNOSIS — R2681 Unsteadiness on feet: Secondary | ICD-10-CM | POA: Diagnosis not present

## 2023-12-10 DIAGNOSIS — M25521 Pain in right elbow: Secondary | ICD-10-CM | POA: Diagnosis not present

## 2023-12-15 DIAGNOSIS — R2689 Other abnormalities of gait and mobility: Secondary | ICD-10-CM | POA: Diagnosis not present

## 2023-12-15 DIAGNOSIS — M25521 Pain in right elbow: Secondary | ICD-10-CM | POA: Diagnosis not present

## 2023-12-15 DIAGNOSIS — R2681 Unsteadiness on feet: Secondary | ICD-10-CM | POA: Diagnosis not present

## 2023-12-23 DIAGNOSIS — R2689 Other abnormalities of gait and mobility: Secondary | ICD-10-CM | POA: Diagnosis not present

## 2023-12-23 DIAGNOSIS — R2681 Unsteadiness on feet: Secondary | ICD-10-CM | POA: Diagnosis not present

## 2023-12-23 DIAGNOSIS — M25521 Pain in right elbow: Secondary | ICD-10-CM | POA: Diagnosis not present

## 2023-12-24 ENCOUNTER — Telehealth: Payer: Self-pay

## 2023-12-24 DIAGNOSIS — R2681 Unsteadiness on feet: Secondary | ICD-10-CM | POA: Diagnosis not present

## 2023-12-24 DIAGNOSIS — M25521 Pain in right elbow: Secondary | ICD-10-CM | POA: Diagnosis not present

## 2023-12-24 DIAGNOSIS — R2689 Other abnormalities of gait and mobility: Secondary | ICD-10-CM | POA: Diagnosis not present

## 2023-12-24 NOTE — Telephone Encounter (Signed)
 Copied from CRM [id number]. Topic: Referral - Question >> Dec 24, 2023  3:13 PM Hector Shade B wrote: Reason for CRM: Physical Therapist Mr. Templeton call and needs to speak with someone about the pts conditions he states for about a month his PA stated the pt had been complaining of lft knee pain and gradually worsening. The PT also notices that its bilateral knee pain with worsening pain on lft knee to the point of buckling in an attempt to walk. He is needing to speak with someone to get the patient to Ortho to take a look at it and possibly start him with injections in that knee. Please call Mr. Templeton at [phone]     Please advise

## 2023-12-24 NOTE — Telephone Encounter (Signed)
 Mychart message sent to patient. Call returned to Hector Beltran and detailed message left with providers response

## 2023-12-24 NOTE — Telephone Encounter (Signed)
 He is overdue for a follow up, please call and have him set up for a routine follow up and can review and discuss plan

## 2023-12-25 DIAGNOSIS — M25521 Pain in right elbow: Secondary | ICD-10-CM | POA: Diagnosis not present

## 2023-12-25 DIAGNOSIS — R2689 Other abnormalities of gait and mobility: Secondary | ICD-10-CM | POA: Diagnosis not present

## 2023-12-25 DIAGNOSIS — R2681 Unsteadiness on feet: Secondary | ICD-10-CM | POA: Diagnosis not present

## 2023-12-30 DIAGNOSIS — R2681 Unsteadiness on feet: Secondary | ICD-10-CM | POA: Diagnosis not present

## 2023-12-30 DIAGNOSIS — M25521 Pain in right elbow: Secondary | ICD-10-CM | POA: Diagnosis not present

## 2023-12-30 DIAGNOSIS — R2689 Other abnormalities of gait and mobility: Secondary | ICD-10-CM | POA: Diagnosis not present

## 2023-12-31 DIAGNOSIS — R2689 Other abnormalities of gait and mobility: Secondary | ICD-10-CM | POA: Diagnosis not present

## 2023-12-31 DIAGNOSIS — M25521 Pain in right elbow: Secondary | ICD-10-CM | POA: Diagnosis not present

## 2023-12-31 DIAGNOSIS — R2681 Unsteadiness on feet: Secondary | ICD-10-CM | POA: Diagnosis not present

## 2024-01-04 DIAGNOSIS — M25521 Pain in right elbow: Secondary | ICD-10-CM | POA: Diagnosis not present

## 2024-01-04 DIAGNOSIS — R2689 Other abnormalities of gait and mobility: Secondary | ICD-10-CM | POA: Diagnosis not present

## 2024-01-04 DIAGNOSIS — R2681 Unsteadiness on feet: Secondary | ICD-10-CM | POA: Diagnosis not present

## 2024-01-06 DIAGNOSIS — R2689 Other abnormalities of gait and mobility: Secondary | ICD-10-CM | POA: Diagnosis not present

## 2024-01-06 DIAGNOSIS — R2681 Unsteadiness on feet: Secondary | ICD-10-CM | POA: Diagnosis not present

## 2024-01-06 DIAGNOSIS — M25521 Pain in right elbow: Secondary | ICD-10-CM | POA: Diagnosis not present

## 2024-01-08 DIAGNOSIS — R2681 Unsteadiness on feet: Secondary | ICD-10-CM | POA: Diagnosis not present

## 2024-01-08 DIAGNOSIS — M25521 Pain in right elbow: Secondary | ICD-10-CM | POA: Diagnosis not present

## 2024-01-08 DIAGNOSIS — R2689 Other abnormalities of gait and mobility: Secondary | ICD-10-CM | POA: Diagnosis not present

## 2024-01-12 DIAGNOSIS — R2681 Unsteadiness on feet: Secondary | ICD-10-CM | POA: Diagnosis not present

## 2024-01-12 DIAGNOSIS — R2689 Other abnormalities of gait and mobility: Secondary | ICD-10-CM | POA: Diagnosis not present

## 2024-01-12 DIAGNOSIS — M25521 Pain in right elbow: Secondary | ICD-10-CM | POA: Diagnosis not present

## 2024-01-13 DIAGNOSIS — M25521 Pain in right elbow: Secondary | ICD-10-CM | POA: Diagnosis not present

## 2024-01-13 DIAGNOSIS — R2681 Unsteadiness on feet: Secondary | ICD-10-CM | POA: Diagnosis not present

## 2024-01-13 DIAGNOSIS — R2689 Other abnormalities of gait and mobility: Secondary | ICD-10-CM | POA: Diagnosis not present

## 2024-01-14 DIAGNOSIS — R2681 Unsteadiness on feet: Secondary | ICD-10-CM | POA: Diagnosis not present

## 2024-01-14 DIAGNOSIS — M25521 Pain in right elbow: Secondary | ICD-10-CM | POA: Diagnosis not present

## 2024-01-14 DIAGNOSIS — R2689 Other abnormalities of gait and mobility: Secondary | ICD-10-CM | POA: Diagnosis not present

## 2024-01-18 DIAGNOSIS — R2689 Other abnormalities of gait and mobility: Secondary | ICD-10-CM | POA: Diagnosis not present

## 2024-01-18 DIAGNOSIS — M25521 Pain in right elbow: Secondary | ICD-10-CM | POA: Diagnosis not present

## 2024-01-18 DIAGNOSIS — R2681 Unsteadiness on feet: Secondary | ICD-10-CM | POA: Diagnosis not present

## 2024-01-20 DIAGNOSIS — R2681 Unsteadiness on feet: Secondary | ICD-10-CM | POA: Diagnosis not present

## 2024-01-20 DIAGNOSIS — M25521 Pain in right elbow: Secondary | ICD-10-CM | POA: Diagnosis not present

## 2024-01-20 DIAGNOSIS — R2689 Other abnormalities of gait and mobility: Secondary | ICD-10-CM | POA: Diagnosis not present

## 2024-01-21 DIAGNOSIS — R2681 Unsteadiness on feet: Secondary | ICD-10-CM | POA: Diagnosis not present

## 2024-01-21 DIAGNOSIS — R2689 Other abnormalities of gait and mobility: Secondary | ICD-10-CM | POA: Diagnosis not present

## 2024-01-21 DIAGNOSIS — M25521 Pain in right elbow: Secondary | ICD-10-CM | POA: Diagnosis not present

## 2024-01-25 DIAGNOSIS — R2681 Unsteadiness on feet: Secondary | ICD-10-CM | POA: Diagnosis not present

## 2024-01-25 DIAGNOSIS — M25521 Pain in right elbow: Secondary | ICD-10-CM | POA: Diagnosis not present

## 2024-01-25 DIAGNOSIS — R2689 Other abnormalities of gait and mobility: Secondary | ICD-10-CM | POA: Diagnosis not present

## 2024-01-28 DIAGNOSIS — R2689 Other abnormalities of gait and mobility: Secondary | ICD-10-CM | POA: Diagnosis not present

## 2024-01-28 DIAGNOSIS — M25521 Pain in right elbow: Secondary | ICD-10-CM | POA: Diagnosis not present

## 2024-01-28 DIAGNOSIS — R2681 Unsteadiness on feet: Secondary | ICD-10-CM | POA: Diagnosis not present

## 2024-02-02 DIAGNOSIS — R2681 Unsteadiness on feet: Secondary | ICD-10-CM | POA: Diagnosis not present

## 2024-02-02 DIAGNOSIS — R2689 Other abnormalities of gait and mobility: Secondary | ICD-10-CM | POA: Diagnosis not present

## 2024-02-02 DIAGNOSIS — M25521 Pain in right elbow: Secondary | ICD-10-CM | POA: Diagnosis not present

## 2024-02-05 DIAGNOSIS — M25521 Pain in right elbow: Secondary | ICD-10-CM | POA: Diagnosis not present

## 2024-02-05 DIAGNOSIS — R2689 Other abnormalities of gait and mobility: Secondary | ICD-10-CM | POA: Diagnosis not present

## 2024-02-05 DIAGNOSIS — R2681 Unsteadiness on feet: Secondary | ICD-10-CM | POA: Diagnosis not present

## 2024-02-09 DIAGNOSIS — M25521 Pain in right elbow: Secondary | ICD-10-CM | POA: Diagnosis not present

## 2024-02-09 DIAGNOSIS — R2689 Other abnormalities of gait and mobility: Secondary | ICD-10-CM | POA: Diagnosis not present

## 2024-02-09 DIAGNOSIS — R2681 Unsteadiness on feet: Secondary | ICD-10-CM | POA: Diagnosis not present

## 2024-02-11 DIAGNOSIS — M25521 Pain in right elbow: Secondary | ICD-10-CM | POA: Diagnosis not present

## 2024-02-11 DIAGNOSIS — R2689 Other abnormalities of gait and mobility: Secondary | ICD-10-CM | POA: Diagnosis not present

## 2024-02-11 DIAGNOSIS — R2681 Unsteadiness on feet: Secondary | ICD-10-CM | POA: Diagnosis not present

## 2024-02-16 DIAGNOSIS — M25521 Pain in right elbow: Secondary | ICD-10-CM | POA: Diagnosis not present

## 2024-02-16 DIAGNOSIS — R2681 Unsteadiness on feet: Secondary | ICD-10-CM | POA: Diagnosis not present

## 2024-02-16 DIAGNOSIS — R2689 Other abnormalities of gait and mobility: Secondary | ICD-10-CM | POA: Diagnosis not present

## 2024-02-18 DIAGNOSIS — R2689 Other abnormalities of gait and mobility: Secondary | ICD-10-CM | POA: Diagnosis not present

## 2024-02-18 DIAGNOSIS — M25521 Pain in right elbow: Secondary | ICD-10-CM | POA: Diagnosis not present

## 2024-02-18 DIAGNOSIS — R2681 Unsteadiness on feet: Secondary | ICD-10-CM | POA: Diagnosis not present

## 2024-02-19 DIAGNOSIS — R2681 Unsteadiness on feet: Secondary | ICD-10-CM | POA: Diagnosis not present

## 2024-02-19 DIAGNOSIS — M25521 Pain in right elbow: Secondary | ICD-10-CM | POA: Diagnosis not present

## 2024-02-19 DIAGNOSIS — R2689 Other abnormalities of gait and mobility: Secondary | ICD-10-CM | POA: Diagnosis not present

## 2024-02-22 DIAGNOSIS — R2689 Other abnormalities of gait and mobility: Secondary | ICD-10-CM | POA: Diagnosis not present

## 2024-02-22 DIAGNOSIS — R2681 Unsteadiness on feet: Secondary | ICD-10-CM | POA: Diagnosis not present

## 2024-02-22 DIAGNOSIS — M25521 Pain in right elbow: Secondary | ICD-10-CM | POA: Diagnosis not present

## 2024-02-24 DIAGNOSIS — M25521 Pain in right elbow: Secondary | ICD-10-CM | POA: Diagnosis not present

## 2024-02-24 DIAGNOSIS — R2689 Other abnormalities of gait and mobility: Secondary | ICD-10-CM | POA: Diagnosis not present

## 2024-02-24 DIAGNOSIS — R2681 Unsteadiness on feet: Secondary | ICD-10-CM | POA: Diagnosis not present

## 2024-03-03 DIAGNOSIS — R2689 Other abnormalities of gait and mobility: Secondary | ICD-10-CM | POA: Diagnosis not present

## 2024-03-03 DIAGNOSIS — M25521 Pain in right elbow: Secondary | ICD-10-CM | POA: Diagnosis not present

## 2024-03-03 DIAGNOSIS — R2681 Unsteadiness on feet: Secondary | ICD-10-CM | POA: Diagnosis not present

## 2024-03-07 DIAGNOSIS — R2681 Unsteadiness on feet: Secondary | ICD-10-CM | POA: Diagnosis not present

## 2024-03-07 DIAGNOSIS — R2689 Other abnormalities of gait and mobility: Secondary | ICD-10-CM | POA: Diagnosis not present

## 2024-03-07 DIAGNOSIS — M25521 Pain in right elbow: Secondary | ICD-10-CM | POA: Diagnosis not present

## 2024-03-10 DIAGNOSIS — R2689 Other abnormalities of gait and mobility: Secondary | ICD-10-CM | POA: Diagnosis not present

## 2024-03-10 DIAGNOSIS — M25521 Pain in right elbow: Secondary | ICD-10-CM | POA: Diagnosis not present

## 2024-03-10 DIAGNOSIS — R2681 Unsteadiness on feet: Secondary | ICD-10-CM | POA: Diagnosis not present

## 2024-03-14 DIAGNOSIS — R2681 Unsteadiness on feet: Secondary | ICD-10-CM | POA: Diagnosis not present

## 2024-03-14 DIAGNOSIS — M25521 Pain in right elbow: Secondary | ICD-10-CM | POA: Diagnosis not present

## 2024-03-14 DIAGNOSIS — R2689 Other abnormalities of gait and mobility: Secondary | ICD-10-CM | POA: Diagnosis not present

## 2024-03-16 DIAGNOSIS — N3946 Mixed incontinence: Secondary | ICD-10-CM | POA: Diagnosis not present

## 2024-03-16 DIAGNOSIS — R2689 Other abnormalities of gait and mobility: Secondary | ICD-10-CM | POA: Diagnosis not present

## 2024-03-16 DIAGNOSIS — R2681 Unsteadiness on feet: Secondary | ICD-10-CM | POA: Diagnosis not present

## 2024-03-16 DIAGNOSIS — M25521 Pain in right elbow: Secondary | ICD-10-CM | POA: Diagnosis not present

## 2024-03-18 DIAGNOSIS — M25521 Pain in right elbow: Secondary | ICD-10-CM | POA: Diagnosis not present

## 2024-03-18 DIAGNOSIS — R2689 Other abnormalities of gait and mobility: Secondary | ICD-10-CM | POA: Diagnosis not present

## 2024-03-18 DIAGNOSIS — R2681 Unsteadiness on feet: Secondary | ICD-10-CM | POA: Diagnosis not present

## 2024-03-22 DIAGNOSIS — N3946 Mixed incontinence: Secondary | ICD-10-CM | POA: Diagnosis not present

## 2024-03-22 DIAGNOSIS — M25521 Pain in right elbow: Secondary | ICD-10-CM | POA: Diagnosis not present

## 2024-03-22 DIAGNOSIS — R2689 Other abnormalities of gait and mobility: Secondary | ICD-10-CM | POA: Diagnosis not present

## 2024-03-22 DIAGNOSIS — R2681 Unsteadiness on feet: Secondary | ICD-10-CM | POA: Diagnosis not present

## 2024-03-24 DIAGNOSIS — R2689 Other abnormalities of gait and mobility: Secondary | ICD-10-CM | POA: Diagnosis not present

## 2024-03-24 DIAGNOSIS — R2681 Unsteadiness on feet: Secondary | ICD-10-CM | POA: Diagnosis not present

## 2024-03-24 DIAGNOSIS — N3946 Mixed incontinence: Secondary | ICD-10-CM | POA: Diagnosis not present

## 2024-03-24 DIAGNOSIS — M25521 Pain in right elbow: Secondary | ICD-10-CM | POA: Diagnosis not present

## 2024-03-29 DIAGNOSIS — R2681 Unsteadiness on feet: Secondary | ICD-10-CM | POA: Diagnosis not present

## 2024-03-29 DIAGNOSIS — M25521 Pain in right elbow: Secondary | ICD-10-CM | POA: Diagnosis not present

## 2024-03-29 DIAGNOSIS — R2689 Other abnormalities of gait and mobility: Secondary | ICD-10-CM | POA: Diagnosis not present

## 2024-04-01 DIAGNOSIS — N3946 Mixed incontinence: Secondary | ICD-10-CM | POA: Diagnosis not present

## 2024-04-01 DIAGNOSIS — R2681 Unsteadiness on feet: Secondary | ICD-10-CM | POA: Diagnosis not present

## 2024-04-01 DIAGNOSIS — M25521 Pain in right elbow: Secondary | ICD-10-CM | POA: Diagnosis not present

## 2024-04-01 DIAGNOSIS — R2689 Other abnormalities of gait and mobility: Secondary | ICD-10-CM | POA: Diagnosis not present

## 2024-04-05 DIAGNOSIS — R2689 Other abnormalities of gait and mobility: Secondary | ICD-10-CM | POA: Diagnosis not present

## 2024-04-05 DIAGNOSIS — M25521 Pain in right elbow: Secondary | ICD-10-CM | POA: Diagnosis not present

## 2024-04-05 DIAGNOSIS — R2681 Unsteadiness on feet: Secondary | ICD-10-CM | POA: Diagnosis not present

## 2024-04-05 DIAGNOSIS — N3946 Mixed incontinence: Secondary | ICD-10-CM | POA: Diagnosis not present

## 2024-04-06 DIAGNOSIS — N3946 Mixed incontinence: Secondary | ICD-10-CM | POA: Diagnosis not present

## 2024-04-07 DIAGNOSIS — R2681 Unsteadiness on feet: Secondary | ICD-10-CM | POA: Diagnosis not present

## 2024-04-07 DIAGNOSIS — M25521 Pain in right elbow: Secondary | ICD-10-CM | POA: Diagnosis not present

## 2024-04-07 DIAGNOSIS — R2689 Other abnormalities of gait and mobility: Secondary | ICD-10-CM | POA: Diagnosis not present

## 2024-04-11 DIAGNOSIS — R2689 Other abnormalities of gait and mobility: Secondary | ICD-10-CM | POA: Diagnosis not present

## 2024-04-11 DIAGNOSIS — M25521 Pain in right elbow: Secondary | ICD-10-CM | POA: Diagnosis not present

## 2024-04-11 DIAGNOSIS — R2681 Unsteadiness on feet: Secondary | ICD-10-CM | POA: Diagnosis not present

## 2024-04-11 DIAGNOSIS — N3946 Mixed incontinence: Secondary | ICD-10-CM | POA: Diagnosis not present

## 2024-04-18 DIAGNOSIS — R2689 Other abnormalities of gait and mobility: Secondary | ICD-10-CM | POA: Diagnosis not present

## 2024-04-18 DIAGNOSIS — M25521 Pain in right elbow: Secondary | ICD-10-CM | POA: Diagnosis not present

## 2024-04-18 DIAGNOSIS — R2681 Unsteadiness on feet: Secondary | ICD-10-CM | POA: Diagnosis not present

## 2024-04-20 DIAGNOSIS — M25521 Pain in right elbow: Secondary | ICD-10-CM | POA: Diagnosis not present

## 2024-04-20 DIAGNOSIS — N3946 Mixed incontinence: Secondary | ICD-10-CM | POA: Diagnosis not present

## 2024-04-20 DIAGNOSIS — R2681 Unsteadiness on feet: Secondary | ICD-10-CM | POA: Diagnosis not present

## 2024-04-20 DIAGNOSIS — R2689 Other abnormalities of gait and mobility: Secondary | ICD-10-CM | POA: Diagnosis not present

## 2024-04-21 DIAGNOSIS — N3946 Mixed incontinence: Secondary | ICD-10-CM | POA: Diagnosis not present

## 2024-04-25 DIAGNOSIS — N3946 Mixed incontinence: Secondary | ICD-10-CM | POA: Diagnosis not present

## 2024-04-26 DIAGNOSIS — M25521 Pain in right elbow: Secondary | ICD-10-CM | POA: Diagnosis not present

## 2024-04-26 DIAGNOSIS — R2689 Other abnormalities of gait and mobility: Secondary | ICD-10-CM | POA: Diagnosis not present

## 2024-04-26 DIAGNOSIS — R2681 Unsteadiness on feet: Secondary | ICD-10-CM | POA: Diagnosis not present

## 2024-04-28 DIAGNOSIS — N3946 Mixed incontinence: Secondary | ICD-10-CM | POA: Diagnosis not present

## 2024-04-29 DIAGNOSIS — R2681 Unsteadiness on feet: Secondary | ICD-10-CM | POA: Diagnosis not present

## 2024-04-29 DIAGNOSIS — R2689 Other abnormalities of gait and mobility: Secondary | ICD-10-CM | POA: Diagnosis not present

## 2024-04-29 DIAGNOSIS — M25521 Pain in right elbow: Secondary | ICD-10-CM | POA: Diagnosis not present

## 2024-05-03 DIAGNOSIS — N3946 Mixed incontinence: Secondary | ICD-10-CM | POA: Diagnosis not present

## 2024-05-04 DIAGNOSIS — R2689 Other abnormalities of gait and mobility: Secondary | ICD-10-CM | POA: Diagnosis not present

## 2024-05-04 DIAGNOSIS — M25521 Pain in right elbow: Secondary | ICD-10-CM | POA: Diagnosis not present

## 2024-05-04 DIAGNOSIS — R2681 Unsteadiness on feet: Secondary | ICD-10-CM | POA: Diagnosis not present

## 2024-05-09 DIAGNOSIS — M25521 Pain in right elbow: Secondary | ICD-10-CM | POA: Diagnosis not present

## 2024-05-09 DIAGNOSIS — R2689 Other abnormalities of gait and mobility: Secondary | ICD-10-CM | POA: Diagnosis not present

## 2024-05-09 DIAGNOSIS — R2681 Unsteadiness on feet: Secondary | ICD-10-CM | POA: Diagnosis not present

## 2024-05-11 DIAGNOSIS — R2681 Unsteadiness on feet: Secondary | ICD-10-CM | POA: Diagnosis not present

## 2024-05-11 DIAGNOSIS — M25521 Pain in right elbow: Secondary | ICD-10-CM | POA: Diagnosis not present

## 2024-05-11 DIAGNOSIS — R2689 Other abnormalities of gait and mobility: Secondary | ICD-10-CM | POA: Diagnosis not present

## 2024-05-12 DIAGNOSIS — N3946 Mixed incontinence: Secondary | ICD-10-CM | POA: Diagnosis not present

## 2024-05-13 DIAGNOSIS — N3946 Mixed incontinence: Secondary | ICD-10-CM | POA: Diagnosis not present

## 2024-05-17 DIAGNOSIS — R2681 Unsteadiness on feet: Secondary | ICD-10-CM | POA: Diagnosis not present

## 2024-05-17 DIAGNOSIS — M25521 Pain in right elbow: Secondary | ICD-10-CM | POA: Diagnosis not present

## 2024-05-17 DIAGNOSIS — R2689 Other abnormalities of gait and mobility: Secondary | ICD-10-CM | POA: Diagnosis not present

## 2024-05-18 DIAGNOSIS — N3946 Mixed incontinence: Secondary | ICD-10-CM | POA: Diagnosis not present

## 2024-05-18 DIAGNOSIS — R2681 Unsteadiness on feet: Secondary | ICD-10-CM | POA: Diagnosis not present

## 2024-05-18 DIAGNOSIS — M25521 Pain in right elbow: Secondary | ICD-10-CM | POA: Diagnosis not present

## 2024-05-18 DIAGNOSIS — R2689 Other abnormalities of gait and mobility: Secondary | ICD-10-CM | POA: Diagnosis not present

## 2024-05-19 ENCOUNTER — Encounter: Payer: Self-pay | Admitting: Neurology

## 2024-05-19 ENCOUNTER — Ambulatory Visit: Payer: Medicare PPO | Admitting: Neurology

## 2024-05-19 VITALS — BP 169/92 | HR 56 | Temp 97.7°F | Wt 216.2 lb

## 2024-05-19 DIAGNOSIS — G609 Hereditary and idiopathic neuropathy, unspecified: Secondary | ICD-10-CM

## 2024-05-19 DIAGNOSIS — H2513 Age-related nuclear cataract, bilateral: Secondary | ICD-10-CM | POA: Diagnosis not present

## 2024-05-19 DIAGNOSIS — R269 Unspecified abnormalities of gait and mobility: Secondary | ICD-10-CM

## 2024-05-19 DIAGNOSIS — H52203 Unspecified astigmatism, bilateral: Secondary | ICD-10-CM | POA: Diagnosis not present

## 2024-05-19 NOTE — Patient Instructions (Signed)
 Nice to see you today.  Continue exercise and therapy.  Be careful not to fall.
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
## 2024-05-23 DIAGNOSIS — R2681 Unsteadiness on feet: Secondary | ICD-10-CM | POA: Diagnosis not present

## 2024-05-23 DIAGNOSIS — R2689 Other abnormalities of gait and mobility: Secondary | ICD-10-CM | POA: Diagnosis not present

## 2024-05-23 DIAGNOSIS — M25521 Pain in right elbow: Secondary | ICD-10-CM | POA: Diagnosis not present

## 2024-05-25 DIAGNOSIS — N3946 Mixed incontinence: Secondary | ICD-10-CM | POA: Diagnosis not present

## 2024-05-30 DIAGNOSIS — N3946 Mixed incontinence: Secondary | ICD-10-CM | POA: Diagnosis not present

## 2024-06-03 DIAGNOSIS — N3946 Mixed incontinence: Secondary | ICD-10-CM | POA: Diagnosis not present

## 2024-06-06 ENCOUNTER — Ambulatory Visit: Admitting: Orthopedic Surgery

## 2024-06-06 ENCOUNTER — Encounter: Payer: Self-pay | Admitting: Orthopedic Surgery

## 2024-06-06 VITALS — BP 126/86 | HR 75 | Temp 97.8°F | Resp 18 | Ht 76.0 in | Wt 218.8 lb

## 2024-06-06 DIAGNOSIS — L03115 Cellulitis of right lower limb: Secondary | ICD-10-CM

## 2024-06-06 DIAGNOSIS — L853 Xerosis cutis: Secondary | ICD-10-CM

## 2024-06-06 DIAGNOSIS — Z23 Encounter for immunization: Secondary | ICD-10-CM

## 2024-06-06 MED ORDER — FLUCONAZOLE 150 MG PO TABS: 150.0000 mg | ORAL_TABLET | Freq: Once | ORAL | 0 refills | Status: AC

## 2024-06-06 MED ORDER — DOXYCYCLINE HYCLATE 100 MG PO TABS: 100.0000 mg | ORAL_TABLET | Freq: Two times a day (BID) | ORAL | 0 refills | Status: AC

## 2024-06-06 NOTE — Progress Notes (Signed)
 Careteam: Patient Care Team: Caro Harlene POUR, NP as PCP - General (Geriatric Medicine) Thukkani, Arun K, MD as PCP - Cardiology (Cardiology) Cleotilde Delon CROME, PT as Physical Therapist (Physical Therapy)  Seen by: Greig Cluster, AGNP-C  PLACE OF SERVICE:  [facility]  Advanced Directive information    No Known Allergies  Chief Complaint  Patient presents with   Foot Swelling    Feet problems     HPI: Patient is a 89 y.o. male seen today for acute visit due to dry skin on feet.   Discussed the use of AI scribe software for clinical note transcription with the patient, who gave verbal consent to proceed.  History of Present Illness   Hector Beltran is a 89 year old male who presents with dry skin and callous formation on the instep of his right foot.  He reports dry skin and callus to both feet. Denies pain. Skin to feet also appears more red. He does not use any lotion or moisturizing products on his feet, although he showers three times a week. He has tried using powder on his feet but does not find it effective.  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   He does not regularly see a podiatrist but is open to making an appointment if necessary.      Review of Systems:  Review of Systems  Constitutional: Negative.   Respiratory: Negative.    Cardiovascular: Negative.   Skin:        Dry skin, feet redness  Neurological:  Negative for sensory change.  Psychiatric/Behavioral: Negative.      Past Medical History:  Diagnosis Date   Cellulitis of right foot 06/04/2022   Constipation    COPD (chronic obstructive pulmonary disease) (HCC)    COVID 05/27/2021   Gait abnormality 02/09/2020   Idiopathic neuropathy 06/04/2022   Neuropathy    Osteomyelitis of second toe of right foot (HCC) 06/04/2022   Peripheral neuropathy 09/30/2017   Pseudomonas aeruginosa infection 06/17/2022   SVT (supraventricular tachycardia)    Past Surgical  History:  Procedure Laterality Date   AMPUTATION TOE Right 06/12/2022   Procedure: RIGHT 2ND AMPUTATION TOE, POSSIBLE RAY AMPUTATION;  Surgeon: Kit Rush, MD;  Location: [facility];  Service: Orthopedics;  Laterality: Right;  2nd toe amputation discarded per surgeon's VO   CYST REMOVAL NECK     HIP ARTHROPLASTY Left 12/24/2021   Procedure: ARTHROPLASTY BIPOLAR HIP (HEMIARTHROPLASTY);  Surgeon: Barbarann Oneil BROCKS, MD;  Location: [facility];  Service: Orthopedics;  Laterality: Left;   Social History:   reports that he quit smoking about 3 years ago. His smoking use included cigarettes. He started smoking about 53 years ago. He has a 12.5 pack-year smoking history. He has never used smokeless tobacco. He reports current alcohol use of about 3.0 standard drinks of alcohol per week. He reports that he does not use drugs.  No family history on file.  Medications: Patient's Medications  New Prescriptions   DOXYCYCLINE  (VIBRA -TABS) 100 MG TABLET    Take 1 tablet (100 mg total) by mouth 2 (two) times daily for 10 days.   FLUCONAZOLE  (DIFLUCAN ) 150 MG TABLET    Take 1 tablet (150 mg total) by mouth once for 1 dose.  Previous Medications   B COMPLEX VITAMINS (B COMPLEX PO)    Take 1 tablet by mouth daily.   COENZYME Q10-FISH OIL-VIT E (CO-Q 10 OMEGA-3 FISH OIL PO)    Take 1  tablet by mouth daily.  Modified Medications   No medications on file  Discontinued Medications   No medications on file    Physical Exam:  Vitals:   06/06/24 1330  BP: 126/86  Pulse: 75  Resp: 18  Temp: 97.8 F (36.6 C)  SpO2: 90%  Weight: 218 lb 12.8 oz (99.2 kg)  Height: 6' 4 (1.93 m)   Body mass index is 26.63 kg/m. Wt Readings from Last 3 Encounters:  06/06/24 218 lb 12.8 oz (99.2 kg)  05/19/24 216 lb 3.2 oz (98.1 kg)  05/19/23 200 lb 6.4 oz (90.9 kg)    Physical Exam Vitals reviewed.  Constitutional:      General: He is not in acute distress. HENT:     Head: Normocephalic.  Eyes:      General:        Right eye: No discharge.        Left eye: No discharge.  Cardiovascular:     Rate and Rhythm: Normal rate and regular rhythm.     Pulses:          Dorsalis pedis pulses are 1+ on the right side and 1+ on the left side.       Posterior tibial pulses are 1+ on the right side and 1+ on the left side.     Heart sounds: Normal heart sounds.  Pulmonary:     Effort: Pulmonary effort is normal.     Breath sounds: Normal breath sounds.  Abdominal:     General: Bowel sounds are normal. There is no distension.     Palpations: Abdomen is soft.     Tenderness: There is no abdominal tenderness.  Musculoskeletal:     Cervical back: Neck supple.     Right lower leg: No edema.     Left lower leg: No edema.     Right foot: Normal range of motion.     Left foot: Normal range of motion.  Feet:     Right foot:     Skin integrity: Skin breakdown, callus and dry skin present.     Toenail Condition: Right toenails are abnormally thick. Fungal disease present.    Left foot:     Skin integrity: Skin breakdown, callus and dry skin present.     Toenail Condition: Left toenails are abnormally thick. Fungal disease present. Skin:    General: Skin is warm.     Capillary Refill: Capillary refill takes less than 2 seconds.     Comments: Dry skin to bilateral feet with callus formation, non tender, feet with mild erythema, faint pulses  Neurological:     General: No focal deficit present.     Mental Status: He is alert and oriented to person, place, and time.     Gait: Gait abnormal.  Psychiatric:        Mood and Affect: Mood normal.     Labs reviewed: Basic Metabolic Panel: No results for input(s): NA, K, CL, CO2, GLUCOSE, BUN, CREATININE, CALCIUM, MG, PHOS, TSH in the last 8760 hours. Liver Function Tests: No results for input(s): AST, ALT, ALKPHOS, BILITOT, PROT, ALBUMIN in the last 8760 hours. No results for input(s): LIPASE, AMYLASE in the last  8760 hours. No results for input(s): AMMONIA in the last 8760 hours. CBC: No results for input(s): WBC, NEUTROABS, HGB, HCT, MCV, PLT in the last 8760 hours. Lipid Panel: No results for input(s): CHOL, HDL, LDLCALC, TRIG, CHOLHDL, LDLDIRECT in the last 8760 hours. TSH: No results for input(s): TSH  in the last 8760 hours. A1C: No results found for: HGBA1C   Assessment/Plan 1. Need for pneumococcal vaccine - Pneumococcal conjugate vaccine 20-valent (PCV20)  2. Immunization due (Primary) - Flu vaccine HIGH DOSE PF(Fluzone Trivalent)  3. Cellulitis of right foot - bilateral feet with dry skin, callus formation, erythema - h/o osteomyelitis 05/2022> right foot second toe amputation - cellulitis versus fungus - contact PCP if symptoms worsen> consider vascular studies due to reoccurrence - doxycycline  (VIBRA -TABS) 100 MG tablet; Take 1 tablet (100 mg total) by mouth 2 (two) times daily for 10 days.  Dispense: 20 tablet; Refill: 0 - fluconazole  (DIFLUCAN ) 150 MG tablet; Take 1 tablet (150 mg total) by mouth once for 1 dose.  Dispense: 1 tablet; Refill: 0  4. Dry skin - start Vaseline to both feet twice daily - no improvement contact Podiatry  Total time: 31 minutes. Greater than 50% of total time spent doing patient education regarding health maintenance, dry skin, skin infection and fungal infection including symptom/medication management.    Next appt: Visit date not found  Lorrie Strauch Gil BODILY  [facility] [phone]

## 2024-06-06 NOTE — Patient Instructions (Signed)
 Please apply vaseline to both feet twice daily > it will cure/prevent dry skin  Antibiotic sent for infection prevention> please complete all of it> it's called doxycycline   Antifungal> one time dose > it's called diflucan   If no improvement contact your podiatrist

## 2024-06-08 DIAGNOSIS — N3946 Mixed incontinence: Secondary | ICD-10-CM | POA: Diagnosis not present

## 2024-07-18 DIAGNOSIS — Z9181 History of falling: Secondary | ICD-10-CM | POA: Diagnosis not present

## 2024-07-18 DIAGNOSIS — Q859 Phakomatosis, unspecified: Secondary | ICD-10-CM | POA: Diagnosis not present

## 2024-07-18 DIAGNOSIS — Z89421 Acquired absence of other right toe(s): Secondary | ICD-10-CM | POA: Diagnosis not present

## 2024-07-18 DIAGNOSIS — R32 Unspecified urinary incontinence: Secondary | ICD-10-CM | POA: Diagnosis not present

## 2024-07-18 DIAGNOSIS — Z85828 Personal history of other malignant neoplasm of skin: Secondary | ICD-10-CM | POA: Diagnosis not present

## 2024-07-18 DIAGNOSIS — G629 Polyneuropathy, unspecified: Secondary | ICD-10-CM | POA: Diagnosis not present

## 2024-07-18 DIAGNOSIS — L309 Dermatitis, unspecified: Secondary | ICD-10-CM | POA: Diagnosis not present

## 2024-07-18 DIAGNOSIS — I10 Essential (primary) hypertension: Secondary | ICD-10-CM | POA: Diagnosis not present

## 2024-07-18 DIAGNOSIS — I951 Orthostatic hypotension: Secondary | ICD-10-CM | POA: Diagnosis not present

## 2024-08-01 ENCOUNTER — Ambulatory Visit: Payer: Self-pay | Admitting: Nurse Practitioner

## 2024-08-04 DIAGNOSIS — L853 Xerosis cutis: Secondary | ICD-10-CM | POA: Diagnosis not present

## 2024-08-04 DIAGNOSIS — D1801 Hemangioma of skin and subcutaneous tissue: Secondary | ICD-10-CM | POA: Diagnosis not present

## 2024-08-04 DIAGNOSIS — B351 Tinea unguium: Secondary | ICD-10-CM | POA: Diagnosis not present

## 2024-08-04 DIAGNOSIS — Z85828 Personal history of other malignant neoplasm of skin: Secondary | ICD-10-CM | POA: Diagnosis not present

## 2024-08-04 DIAGNOSIS — L218 Other seborrheic dermatitis: Secondary | ICD-10-CM | POA: Diagnosis not present

## 2024-08-04 DIAGNOSIS — L308 Other specified dermatitis: Secondary | ICD-10-CM | POA: Diagnosis not present

## 2024-08-04 DIAGNOSIS — B353 Tinea pedis: Secondary | ICD-10-CM | POA: Diagnosis not present

## 2024-08-04 DIAGNOSIS — L821 Other seborrheic keratosis: Secondary | ICD-10-CM | POA: Diagnosis not present

## 2024-08-04 DIAGNOSIS — B372 Candidiasis of skin and nail: Secondary | ICD-10-CM | POA: Diagnosis not present

## 2024-08-24 DIAGNOSIS — M62830 Muscle spasm of back: Secondary | ICD-10-CM | POA: Diagnosis not present

## 2024-08-24 DIAGNOSIS — M9901 Segmental and somatic dysfunction of cervical region: Secondary | ICD-10-CM | POA: Diagnosis not present

## 2024-08-24 DIAGNOSIS — M9903 Segmental and somatic dysfunction of lumbar region: Secondary | ICD-10-CM | POA: Diagnosis not present

## 2024-08-24 DIAGNOSIS — M50322 Other cervical disc degeneration at C5-C6 level: Secondary | ICD-10-CM | POA: Diagnosis not present

## 2024-08-24 DIAGNOSIS — M9902 Segmental and somatic dysfunction of thoracic region: Secondary | ICD-10-CM | POA: Diagnosis not present

## 2024-08-24 DIAGNOSIS — M47814 Spondylosis without myelopathy or radiculopathy, thoracic region: Secondary | ICD-10-CM | POA: Diagnosis not present

## 2025-01-03 ENCOUNTER — Ambulatory Visit: Admitting: Neurology
